# Patient Record
Sex: Female | Born: 1935 | Race: White | Hispanic: No | Marital: Single | Smoking: Never smoker
Health system: Southern US, Community
[De-identification: ages and names within clinical notes are randomized; demographics above are authoritative.]

## PROBLEM LIST (undated history)

## (undated) DIAGNOSIS — J302 Other seasonal allergic rhinitis: Secondary | ICD-10-CM

## (undated) DIAGNOSIS — E039 Hypothyroidism, unspecified: Secondary | ICD-10-CM

## (undated) DIAGNOSIS — M353 Polymyalgia rheumatica: Secondary | ICD-10-CM

## (undated) DIAGNOSIS — C9000 Multiple myeloma not having achieved remission: Secondary | ICD-10-CM

## (undated) DIAGNOSIS — K5790 Diverticulosis of intestine, part unspecified, without perforation or abscess without bleeding: Secondary | ICD-10-CM

## (undated) DIAGNOSIS — R002 Palpitations: Secondary | ICD-10-CM

## (undated) DIAGNOSIS — K644 Residual hemorrhoidal skin tags: Secondary | ICD-10-CM

## (undated) DIAGNOSIS — M549 Dorsalgia, unspecified: Secondary | ICD-10-CM

## (undated) DIAGNOSIS — D649 Anemia, unspecified: Secondary | ICD-10-CM

## (undated) DIAGNOSIS — E785 Hyperlipidemia, unspecified: Secondary | ICD-10-CM

## (undated) DIAGNOSIS — K219 Gastro-esophageal reflux disease without esophagitis: Secondary | ICD-10-CM

## (undated) DIAGNOSIS — I10 Essential (primary) hypertension: Secondary | ICD-10-CM

## (undated) DIAGNOSIS — M1990 Unspecified osteoarthritis, unspecified site: Secondary | ICD-10-CM

## (undated) DIAGNOSIS — R001 Bradycardia, unspecified: Secondary | ICD-10-CM

## (undated) DIAGNOSIS — Z950 Presence of cardiac pacemaker: Secondary | ICD-10-CM

## (undated) DIAGNOSIS — E0590 Thyrotoxicosis, unspecified without thyrotoxic crisis or storm: Secondary | ICD-10-CM

## (undated) DIAGNOSIS — H3530 Unspecified macular degeneration: Secondary | ICD-10-CM

## (undated) DIAGNOSIS — E049 Nontoxic goiter, unspecified: Secondary | ICD-10-CM

## (undated) DIAGNOSIS — G8929 Other chronic pain: Secondary | ICD-10-CM

## (undated) HISTORY — DX: Thyrotoxicosis, unspecified without thyrotoxic crisis or storm: E05.90

## (undated) HISTORY — PX: INSERT / REPLACE / REMOVE PACEMAKER: SUR710

## (undated) HISTORY — PX: THYROIDECTOMY: SHX17

## (undated) HISTORY — DX: Nontoxic goiter, unspecified: E04.9

## (undated) HISTORY — PX: OTHER SURGICAL HISTORY: SHX169

## (undated) HISTORY — PX: TONSILLECTOMY: SUR1361

## (undated) HISTORY — DX: Multiple myeloma not having achieved remission: C90.00

---

## 2000-01-11 ENCOUNTER — Encounter: Payer: Self-pay | Admitting: Family Medicine

## 2000-01-11 ENCOUNTER — Encounter: Admission: RE | Admit: 2000-01-11 | Discharge: 2000-01-11 | Payer: Self-pay | Admitting: Family Medicine

## 2002-04-28 ENCOUNTER — Encounter: Admission: RE | Admit: 2002-04-28 | Discharge: 2002-04-28 | Payer: Self-pay | Admitting: Specialist

## 2002-04-28 ENCOUNTER — Encounter: Payer: Self-pay | Admitting: Specialist

## 2002-12-26 ENCOUNTER — Emergency Department (HOSPITAL_COMMUNITY): Admission: EM | Admit: 2002-12-26 | Discharge: 2002-12-26 | Payer: Self-pay | Admitting: *Deleted

## 2002-12-26 ENCOUNTER — Encounter: Payer: Self-pay | Admitting: *Deleted

## 2003-04-21 ENCOUNTER — Ambulatory Visit (HOSPITAL_COMMUNITY): Admission: RE | Admit: 2003-04-21 | Discharge: 2003-04-21 | Payer: Self-pay | Admitting: Specialist

## 2006-05-22 ENCOUNTER — Encounter (INDEPENDENT_AMBULATORY_CARE_PROVIDER_SITE_OTHER): Payer: Self-pay | Admitting: Specialist

## 2006-05-22 ENCOUNTER — Ambulatory Visit (HOSPITAL_COMMUNITY): Admission: RE | Admit: 2006-05-22 | Discharge: 2006-05-22 | Payer: Self-pay | Admitting: Gastroenterology

## 2006-09-07 ENCOUNTER — Ambulatory Visit: Payer: Self-pay | Admitting: *Deleted

## 2006-09-07 ENCOUNTER — Inpatient Hospital Stay (HOSPITAL_COMMUNITY): Admission: EM | Admit: 2006-09-07 | Discharge: 2006-09-09 | Payer: Self-pay | Admitting: Emergency Medicine

## 2006-09-18 ENCOUNTER — Ambulatory Visit: Payer: Self-pay | Admitting: Cardiology

## 2006-09-18 ENCOUNTER — Encounter (HOSPITAL_COMMUNITY): Admission: RE | Admit: 2006-09-18 | Discharge: 2006-10-18 | Payer: Self-pay | Admitting: Cardiology

## 2006-09-24 ENCOUNTER — Ambulatory Visit (HOSPITAL_COMMUNITY): Admission: RE | Admit: 2006-09-24 | Discharge: 2006-09-24 | Payer: Self-pay | Admitting: Cardiology

## 2006-10-02 ENCOUNTER — Ambulatory Visit: Payer: Self-pay | Admitting: Cardiology

## 2008-08-27 ENCOUNTER — Ambulatory Visit (HOSPITAL_COMMUNITY): Admission: RE | Admit: 2008-08-27 | Discharge: 2008-08-27 | Payer: Self-pay | Admitting: Internal Medicine

## 2009-12-28 ENCOUNTER — Ambulatory Visit: Payer: Self-pay | Admitting: Cardiology

## 2009-12-28 ENCOUNTER — Inpatient Hospital Stay (HOSPITAL_COMMUNITY)
Admission: EM | Admit: 2009-12-28 | Discharge: 2009-12-30 | Payer: Self-pay | Source: Home / Self Care | Admitting: Emergency Medicine

## 2010-01-03 ENCOUNTER — Ambulatory Visit: Payer: Self-pay | Admitting: Cardiology

## 2010-01-03 ENCOUNTER — Encounter: Payer: Self-pay | Admitting: Adult Health

## 2010-01-03 DIAGNOSIS — D649 Anemia, unspecified: Secondary | ICD-10-CM | POA: Insufficient documentation

## 2010-01-03 DIAGNOSIS — I498 Other specified cardiac arrhythmias: Secondary | ICD-10-CM

## 2010-01-03 DIAGNOSIS — R002 Palpitations: Secondary | ICD-10-CM | POA: Insufficient documentation

## 2010-01-03 DIAGNOSIS — E7800 Pure hypercholesterolemia, unspecified: Secondary | ICD-10-CM

## 2010-01-09 ENCOUNTER — Ambulatory Visit (HOSPITAL_COMMUNITY): Admission: RE | Admit: 2010-01-09 | Discharge: 2010-01-11 | Payer: Self-pay | Admitting: Cardiology

## 2010-01-11 ENCOUNTER — Ambulatory Visit: Payer: Self-pay | Admitting: Cardiology

## 2010-01-17 ENCOUNTER — Ambulatory Visit: Payer: Self-pay | Admitting: Cardiology

## 2010-02-21 ENCOUNTER — Ambulatory Visit: Payer: Self-pay | Admitting: Internal Medicine

## 2010-02-21 HISTORY — PX: COLONOSCOPY: SHX174

## 2010-03-22 ENCOUNTER — Ambulatory Visit: Payer: Self-pay | Admitting: Internal Medicine

## 2010-03-22 ENCOUNTER — Ambulatory Visit (HOSPITAL_COMMUNITY): Admission: RE | Admit: 2010-03-22 | Discharge: 2010-03-22 | Payer: Self-pay | Admitting: Internal Medicine

## 2010-04-28 ENCOUNTER — Ambulatory Visit (HOSPITAL_COMMUNITY)
Admission: RE | Admit: 2010-04-28 | Discharge: 2010-04-28 | Payer: Self-pay | Source: Home / Self Care | Attending: Internal Medicine | Admitting: Internal Medicine

## 2010-05-25 NOTE — Assessment & Plan Note (Signed)
Summary: 2 wk f/u per checkout on 01/03/10/tg   Visit Type:  Follow-up Primary Trashawn Oquendo:  [name]  CC:  stll having palpatations.  History of Present Illness: [name] is a very pleasant 77 y/o CF we are seeing on follow-up after having a holter monitor placed secondary to frequent palpatations.  She was originally seen while hospitalized for bradycardia and palpatations on consultation.  She was found to be bradycardic on norvasc and this was discontinued when she left the hospital. Instead she was placed on hydralazine 25mg  two times a day.  She continued to have palpatations and therefore the holter monitor was placed.  She continues to have the same complaints but is asymptomatic with them.  Current Medications (verified): 1)  Amitiza 8 Mcg Caps (Lubiprostone) .... Take 1 Tab Two Times A Day 2)  Flonase 50 Mcg/act Susp (Fluticasone Propionate) .... Use As Needed 3)  Vitamin D2 400 Unit Tabs (Ergocalciferol) .... Take 1 Tab Daily 4)  Metamucil Multihealth Fiber 55.46 % Powd (Psyllium) .... Use As Needed 5)  Miralax  Powd (Polyethylene Glycol 3350) .... Use As Needed 6)  Omega-3 Fish Oil 1200 Mg Caps (Omega-3 Fatty Acids) .... Take 1 Cap Daily 7)  Ocuvite  Tabs (Multiple Vitamins-Minerals) .... Take 1 Tab Daily 8)  Aspir-Low 81 Mg Tbec (Aspirin) .... Take 1 Tab Daily 9)  Alprazolam 0.25 Mg Tabs (Alprazolam) .... Take As Needed At Bedtime 10)  Cetirizine Hcl 10 Mg Tabs (Cetirizine Hcl) .... Take As Needed 11)  Alendronate Sodium 70 Mg Tabs (Alendronate Sodium) .... Take 1 Tab Weekly 12)  Simvastatin 20 Mg Tabs (Simvastatin) .... Take 1 Tab Daily 13)  Lisinopril 20 Mg Tabs (Lisinopril) .... Take 1 Tab Two Times A Day 14)  Levothroid 50 Mcg Tabs (Levothyroxine Sodium) .... Take 1 Tab Daily 15)  Hydralazine Hcl 25 Mg Tabs (Hydralazine Hcl) .Marland Kitchen.. 1 Po Two Times A Day 16)  Omeprazole 20 Mg Cpdr (Omeprazole) .Marland Kitchen.. 1 By Mouth Daily 17)  Multivitamins   Tabs (Multiple Vitamin) .Marland Kitchen.. 1 By  Mouth Daily 18)  Aspirin 81 Mg  Tabs (Aspirin) .Marland Kitchen.. 1 By Mouth Daily 19)  Magnesium 200 Mg Tabs (Magnesium) .... Take 2 Tablets By Mouth Daily  Allergies (verified): No Known Drug Allergies  Review of Systems       Palpatations.  Urinary frequency  All other systems have been reviewed and are negative unless stated above.   Vital Signs:  Patient profile:   77 year old female Weight:      139 pounds BMI:     24.71 Pulse rate:   68 / minute BP sitting:   134 / 51  (right arm)  Vitals Entered By: Marilyn Saa, CNA (January 17, 2010 2:06 PM)  Physical Exam  General:  Well developed, well nourished, in no acute distress. Lungs:  Clear bilaterally to auscultation and percussion. Heart:  Non-displaced PMI, chest non-tender; regular rate and rhythm, S1, S2 without murmurs, rubs or gallops. Carotid upstroke normal, no bruit. Normal abdominal aortic size, no bruits. Femorals normal pulses, no bruits. Pedals normal pulses. No edema, no varicosities. Pulses:  pulses normal in all 4 extremities Psych:  Normal affect.   Impression & Recommendations:  Problem # 1:  PALPITATIONS (ICD-785.1) I have reviewed the Holter monitor report read by Dr. Diona Browner.  It demonstrated that the rhythm is sinus with a range of 39-126 bpm.  No pauses with occasional PAC's and PVC's without sustained arryhtymias.  I have discussed tha test resutls with  her. I also reassured her that her palpatations are benign and can be caused by stress, extra caffine, or lack of sleep.  She stated that she does not sleep more than a couple of hours at a time because she gets up to go to the bathroom several times a night.  She states this has been an ongoing problem and has discussed this with her primary.  I have reviewed her medications and do not see any medications that would cause nocturnal excessive urination.  I have recommended that she see a urologist after talking with Dr Margo Aye on follow-up.  I have advised that she  take on magnesium  tablet daily to assist with palpatations. She will see [place] again in 6 months. Her updated medication list for this problem includes:    Aspir-low 81 Mg Tbec (Aspirin) .Marland Kitchen... Take 1 tab daily    Lisinopril 20 Mg Tabs (Lisinopril) .Marland Kitchen... Take 1 tab two times a day    Aspirin 81 Mg Tabs (Aspirin) .Marland Kitchen... 1 by mouth daily  Patient Instructions: 1)  Your physician recommends that you schedule a follow-up appointment in: 6 months 2)  Your physician has recommended you make the following change in your medication: magnesium 200mg   take 2 tablets by mouth daily

## 2010-05-25 NOTE — Procedures (Signed)
Summary: Holter and Event  Holter and Event   Imported By: Faythe Ghee 01/11/2010 16:01:15  _____________________________________________________________________  External Attachment:    Type:   Image     Comment:   External Document  Appended Document: Holter and Event I spoke with pt and reported findings, verbalized understanding

## 2010-05-25 NOTE — Assessment & Plan Note (Signed)
Summary: EC6 PALPITATIONS LAST SEEN RR 2008/[facility]J   Visit Type:  Follow-up  CC:  palpitations.  History of Present Illness: [name] is a very pleasant 77 y/o CF we are seeing on follow-up post hospitalization where we assessed her for bradycardia and palpatations.  At that time she was taken off of Norvasc and placed on hydralazine for BP control as she was bradycardic.  She has a history of hypertension, hypthyroidism with recent adjustment of medications, normocytic anemia with hemoccult + stool inpt. She comes today with continued complaints of palpatations which sometimes awaken her at night, but occur throughout the day.  She denies associated chest pain or dyspnea.  Sometimes there is associated weakness.  Current Medications (verified): 1)  Amitiza 8 Mcg Caps (Lubiprostone) .... Take 1 Tab Two Times A Day 2)  Flonase 50 Mcg/act Susp (Fluticasone Propionate) .... Use As Needed 3)  Vitamin D2 400 Unit Tabs (Ergocalciferol) .... Take 1 Tab Daily 4)  Metamucil Multihealth Fiber 55.46 % Powd (Psyllium) .... Use As Needed 5)  Miralax  Powd (Polyethylene Glycol 3350) .... Use As Needed 6)  Omega-3 Fish Oil 1200 Mg Caps (Omega-3 Fatty Acids) .... Take 1 Cap Daily 7)  Ocuvite  Tabs (Multiple Vitamins-Minerals) .... Take 1 Tab Daily 8)  Aspir-Low 81 Mg Tbec (Aspirin) .... Take 1 Tab Daily 9)  Alprazolam 0.25 Mg Tabs (Alprazolam) .... Take As Needed At Bedtime 10)  Cetirizine Hcl 10 Mg Tabs (Cetirizine Hcl) .... Take As Needed 11)  Alendronate Sodium 70 Mg Tabs (Alendronate Sodium) .... Take 1 Tab Weekly 12)  Simvastatin 20 Mg Tabs (Simvastatin) .... Take 1 Tab Daily 13)  Lisinopril 20 Mg Tabs (Lisinopril) .... Take 1 Tab Two Times A Day 14)  Levothroid 50 Mcg Tabs (Levothyroxine Sodium) .... Take 1 Tab Daily 15)  Hydralazine Hcl 25 Mg Tabs (Hydralazine Hcl) .Marland Kitchen.. 1 Po Two Times A Day 16)  Omeprazole 20 Mg Cpdr (Omeprazole) .Marland Kitchen.. 1 By Mouth Daily 17)  Multivitamins   Tabs (Multiple Vitamin)  .Marland Kitchen.. 1 By Mouth Daily 18)  Aspirin 81 Mg  Tabs (Aspirin) .Marland Kitchen.. 1 By Mouth Daily  Allergies (verified): No Known Drug Allergies  Past History:  Past medical, surgical, family and social histories (including risk factors) reviewed, and no changes noted (except as noted below).  Past Medical History: Reviewed history from 01/02/2010 and no changes required. anemia and bradyarrhythmia palpatations hypothyroidism hypertension hyperlipidemia chronic constipation  Past Surgical History: Reviewed history from 01/02/2010 and no changes required. tonsillectomy  Family History: Reviewed history from 01/02/2010 and no changes required. Father:deceased due to colon cancer Mother:deceased due to chf and diabetes Siblings:brother deceased due to metastatic lung cancer 1 sister alive has heart and back problems  Social History: Reviewed history from 01/02/2010 and no changes required. Retired  Widowed  Tobacco Use - No.  Alcohol Use - no Regular Exercise - no Drug Use - no  Review of Systems       Palpatations  All other systems have been reviewed and are negative unless stated above.   Vital Signs:  Patient profile:   77 year old female Height:      63 inches Weight:      140 pounds BMI:     24.89 Pulse rate:   76 / minute Resp:     16 per minute BP sitting:   159 / 72  (left arm)  Vitals Entered By: Marrion Coy, CNA (January 03, 2010 2:46 PM)  Physical Exam  General:  Well  developed, well nourished, in no acute distress. Lungs:  Clear bilaterally to auscultation and percussion. Heart:  Non-displaced PMI, chest non-tender; regular rate and rhythm, S1, S2 without murmurs, rubs or gallops. Carotid upstroke normal, no bruit. Normal abdominal aortic size, no bruits. Femorals normal pulses, no bruits. Pedals normal pulses. No edema, no varicosities. Abdomen:  Bowel sounds positive; abdomen soft and non-tender without masses, organomegaly, or hernias noted. No  hepatosplenomegaly. Msk:  Back normal, normal gait. Muscle strength and tone normal. Pulses:  pulses normal in all 4 extremities Extremities:  No clubbing or cyanosis. Neurologic:  Alert and oriented x 3. Psych:  Normal affect.   Impression & Recommendations:  Problem # 1:  PALPITATIONS (ICD-785.1) Will plan 48 hr holter monitor placement to assess for frequency and duration of self reported palpations.  We will not change medications at this time to test her on current regimine. The following medications were removed from the medication list:    Amlodipine Besylate 2.5 Mg Tabs (Amlodipine besylate) .Marland Kitchen... Take 1 tab daily Her updated medication list for this problem includes:    Aspir-low 81 Mg Tbec (Aspirin) .Marland Kitchen... Take 1 tab daily    Lisinopril 20 Mg Tabs (Lisinopril) .Marland Kitchen... Take 1 tab two times a day    Aspirin 81 Mg Tabs (Aspirin) .Marland Kitchen... 1 by mouth daily  Orders: Holter Monitor (Holter Monitor)  Problem # 2:  ANEMIA (ICD-285.9) She states she has been placed on a MVI with iron and is planned to have colonoscopy next week. Hemoccut card is provided with instructions. Orders: Hemoccult Cards (Take Home) (Hemoccult Cards)  Patient Instructions: 1)  Your physician recommends that you schedule a follow-up appointment in: 2 weeks 2)  Your physician has recommended that you wear a holter monitor.  Holter monitors are medical devices that record the heart's electrical activity. Doctors most often use these monitors to diagnose arrhythmias. Arrhythmias are problems with the speed or rhythm of the heartbeat. The monitor is a small, portable device. You can wear one while you do your normal daily activities. This is usually used to diagnose what is causing palpitations/syncope (passing out). 3)  Your physician has asked that you test your stool for blood. It is necessary to test 3 different stool specimens for accuracy. You will be given 3 hemoccult cards for specimen collection. For each stool  specimen, place a small portion of stool sample (from 2 different areas of the stool) into the 2 squares on the card. Close card. Repeat with 2 more stool specimens. Bring the cards back to the office for testing.

## 2010-06-19 ENCOUNTER — Encounter: Payer: Self-pay | Admitting: Adult Health

## 2010-06-19 ENCOUNTER — Ambulatory Visit (INDEPENDENT_AMBULATORY_CARE_PROVIDER_SITE_OTHER): Payer: MEDICARE | Admitting: Adult Health

## 2010-06-19 DIAGNOSIS — I10 Essential (primary) hypertension: Secondary | ICD-10-CM

## 2010-06-19 DIAGNOSIS — R002 Palpitations: Secondary | ICD-10-CM

## 2010-06-20 ENCOUNTER — Encounter: Payer: Self-pay | Admitting: Adult Health

## 2010-06-29 NOTE — Assessment & Plan Note (Signed)
Summary: FOLLOW UP - 6 MONTHS   Visit Type:  Follow-up Primary Provider:  Sabino Snipes  CC:  6 mth fu having palps.  History of Present Illness: [name] is a 77 y/o CF we are following for complaints of palpatations. She had a holter monitor placed by Dr. Diona Browner in Sept 2011 demonstrating mild PAC's. and PVC but no sustained arrythimias.  She continues to have occasional palpatations but not enough to stop her from her usual activities. She is on magnesium and this has helped some.  She is feeling well otherwise. She remains active  Current Medications (verified): 1)  Amitiza 8 Mcg Caps (Lubiprostone) .... Take 1 Tab Two Times A Day 2)  Flonase 50 Mcg/act Susp (Fluticasone Propionate) .... Use As Needed 3)  Vitamin D2 400 Unit Tabs (Ergocalciferol) .... Take 1 Tab Daily 4)  Metamucil Multihealth Fiber 55.46 % Powd (Psyllium) .... Use As Needed 5)  Miralax  Powd (Polyethylene Glycol 3350) .... Use As Needed 6)  Omega-3 Fish Oil 1200 Mg Caps (Omega-3 Fatty Acids) .... Take 1 Cap Daily 7)  Aspir-Low 81 Mg Tbec (Aspirin) .... Take 1 Tab Daily 8)  Alprazolam 0.25 Mg Tabs (Alprazolam) .... Take As Needed At Bedtime 9)  Cetirizine Hcl 10 Mg Tabs (Cetirizine Hcl) .... Take As Needed 10)  Alendronate Sodium 70 Mg Tabs (Alendronate Sodium) .... Take 1 Tab Weekly 11)  Simvastatin 20 Mg Tabs (Simvastatin) .... Take 1 Tab Daily 12)  Lisinopril 20 Mg Tabs (Lisinopril) .... Take 1 Tab Two Times A Day 13)  Levothroid 50 Mcg Tabs (Levothyroxine Sodium) .... Take 1 Tab Daily 14)  Hydralazine Hcl 25 Mg Tabs (Hydralazine Hcl) .Marland Kitchen.. 1 Po Two Times A Day 15)  Multivitamins   Tabs (Multiple Vitamin) .Marland Kitchen.. 1 By Mouth Daily 16)  Aspirin 81 Mg  Tabs (Aspirin) .Marland Kitchen.. 1 By Mouth Daily 17)  Magnesium 200 Mg Tabs (Magnesium) .... Take 2 Tablets By Mouth Daily 18)  Perdiem Overnight Relief 15 Mg Tabs (Sennosides) .... Take As Needed 19)  Macuvite Eye Care  Tabs (Multiple Vitamins-Minerals) .... Take 1 Tab  Daily  Allergies (verified): No Known Drug Allergies  Comments:  Nurse/Medical Assistant: patient brought med list we reviewed [name] is pharmacy  Review of Systems       All other systems have been reviewed and are negative unless stated above.   Vital Signs:  Patient profile:   77 year old female Weight:      140 pounds BMI:     24.89 Pulse rate:   65 / minute BP sitting:   144 / 73  (left arm)  Vitals Entered By: Marilyn Saa, CNA (June 19, 2010 2:08 PM)  Physical Exam  General:  Well developed, well nourished, in no acute distress.healthy appearing.   Lungs:  Clear bilaterally to auscultation and percussion. Heart:  Non-displaced PMI, chest non-tender; regular rate and rhythm, S1, S2 without murmurs, rubs or gallops. Carotid upstroke normal, no bruit. Normal abdominal aortic size, no bruits. Femorals normal pulses, no bruits. Pedals normal pulses. No edema, no varicosities. Abdomen:  Bowel sounds positive; abdomen soft and non-tender without masses, organomegaly, or hernias noted. No hepatosplenomegaly. Msk:  Back normal, normal gait. Muscle strength and tone normal. Pulses:  pulses normal in all 4 extremities Extremities:  No clubbing or cyanosis. Neurologic:  Alert and oriented x 3. Psych:  Normal affect.   Impression & Recommendations:  Problem # 1:  PALPITATIONS (ICD-785.1) Assessment Unchanged We will continue to see her for  this.  No medication changes at this time.  She is asymptomatic with the palpatations.  Reassurance is given to her. Should she have symptoms of syncope or chest pain associated with these, she is to call us sooner.  Otherwise she will see Dr. Dietrich Pates in 6 monhts. Her updated medication list for this problem includes:    Aspir-low 81 Mg Tbec (Aspirin) .Marland Kitchen... Take 1 tab daily    Lisinopril 20 Mg Tabs (Lisinopril) .Marland Kitchen... Take 1 tab two times a day    Aspirin 81 Mg Tabs (Aspirin) .Marland Kitchen... 1 by mouth daily  Patient  Instructions: 1)  Your physician recommends that you schedule a follow-up appointment in: 6 months

## 2010-07-06 LAB — BASIC METABOLIC PANEL
BUN: 21 mg/dL (ref 6–23)
CO2: 26 mEq/L (ref 19–32)
CO2: 26 mEq/L (ref 19–32)
Calcium: 8.7 mg/dL (ref 8.4–10.5)
Calcium: 9.4 mg/dL (ref 8.4–10.5)
Chloride: 102 mEq/L (ref 96–112)
Chloride: 104 mEq/L (ref 96–112)
GFR calc Af Amer: >60 mL/min (ref >60)
GFR calc Af Amer: >60 mL/min (ref >60)
GFR calc non Af Amer: 55 mL/min — ABNORMAL LOW (ref >60)
Glucose, Bld: 86 mg/dL (ref 70–99)
Glucose, Bld: 91 mg/dL (ref 70–99)
Potassium: 3.8 mEq/L (ref 3.5–5.1)
Sodium: 133 mEq/L — ABNORMAL LOW (ref 135–145)
Sodium: 135 mEq/L (ref 135–145)

## 2010-07-06 LAB — HEPATIC FUNCTION PANEL
AST: 21 U/L (ref 0–37)
Albumin: 3.8 g/dL (ref 3.5–5.2)
Total Bilirubin: 0.7 mg/dL (ref 0.3–1.2)

## 2010-07-06 LAB — IRON AND TIBC
TIBC: 239 ug/dL — ABNORMAL LOW (ref 250–470)
UIBC: 189 ug/dL

## 2010-07-06 LAB — URINALYSIS, ROUTINE W REFLEX MICROSCOPIC
Glucose, UA: NEGATIVE mg/dL
Specific Gravity, Urine: 1.01 (ref 1.005–1.030)
pH: 6 (ref 5.0–8.0)

## 2010-07-06 LAB — CBC
HCT: 29.2 % — ABNORMAL LOW (ref 36.0–46.0)
Hemoglobin: 11 g/dL — ABNORMAL LOW (ref 12.0–15.0)
Hemoglobin: 9.9 g/dL — ABNORMAL LOW (ref 12.0–15.0)
MCH: 34.1 pg — ABNORMAL HIGH (ref 26.0–34.0)
MCHC: 35.2 g/dL (ref 30.0–36.0)
MCHC: 35.7 g/dL (ref 30.0–36.0)
MCV: 95.3 fL (ref 78.0–100.0)
MCV: 95.8 fL (ref 78.0–100.0)
RBC: 2.91 MIL/uL — ABNORMAL LOW (ref 3.87–5.11)
RDW: 15.7 % — ABNORMAL HIGH (ref 11.5–15.5)
WBC: 4.6 10*3/uL (ref 4.0–10.5)

## 2010-07-06 LAB — DIFFERENTIAL
Basophils Absolute: 0 10*3/uL (ref 0.0–0.1)
Basophils Absolute: 0 10*3/uL (ref 0.0–0.1)
Basophils Relative: 1 % (ref 0–1)
Basophils Relative: 1 % (ref 0–1)
Eosinophils Absolute: 0.1 10*3/uL (ref 0.0–0.7)
Eosinophils Relative: 2 % (ref 0–5)
Lymphocytes Relative: 40 % (ref 12–46)
Lymphocytes Relative: 48 % — ABNORMAL HIGH (ref 12–46)
Lymphs Abs: 2.2 10*3/uL (ref 0.7–4.0)
Monocytes Absolute: 0.5 10*3/uL (ref 0.1–1.0)
Monocytes Relative: 10 % (ref 3–12)
Monocytes Relative: 11 % (ref 3–12)
Neutro Abs: 1.9 10*3/uL (ref 1.7–7.7)
Neutrophils Relative %: 39 % — ABNORMAL LOW (ref 43–77)
Neutrophils Relative %: 50 % (ref 43–77)

## 2010-07-06 LAB — POCT CARDIAC MARKERS
CKMB, poc: <1 ng/mL — ABNORMAL LOW (ref 1.0–8.0)
Myoglobin, poc: 52.3 ng/mL (ref 12–200)

## 2010-07-06 LAB — FOLATE: Folate: 17.1 ng/mL

## 2010-07-06 LAB — T4, FREE: Free T4: 1.23 ng/dL (ref 0.80–1.80)

## 2010-07-06 LAB — RETICULOCYTES: Retic Ct Pct: 3.3 % — ABNORMAL HIGH (ref 0.4–3.1)

## 2010-07-06 LAB — FERRITIN: Ferritin: 62 ng/mL (ref 10–291)

## 2010-09-05 NOTE — Letter (Signed)
October 02, 2006    Teena Irani. Arlyce Dice, M.D.  [address] [place]  [address]   RE:  KATHALENE, SPORER  MRN:  [id number]  /  DOB:  [birth year]   Dear Onalee Hua:   Ms. Zale returns to the office after a hiatus of six years. She was  recently seen at [facility] for chest pain. Myocardial  infarction was ruled out. An outpatient workup was advised. She had an  echocardiogram that was normal except for the presence of LVH. A graded  exercise test showed impaired exercised capacity with normal left  ventricular size, normal left ventricular systolic function, and breast  attenuation without convincing evidence for infarction or ischemia.   Ms. Deanda has felt better since hospital discharge. She continues to  have occasional episodes of sharp momentary chest discomfort, but these  have been present for some time.   Her blood pressure was quite elevated at the time she presented to the  hospital with a systolic in the range of 200-215. She has monitored her  blood pressure very carefully since returning home, providing [place] with  perhaps 50 readings. Approximately 90% are just fine. She has absolutely  no diastolic hypertension. She has occasional systolic values in the low  150s and high 140s. Her blood pressure device was checked against ours  and was found to be accurate.   CURRENT MEDICATIONS:  1. Aspirin 81 mg daily.  2. Actonel 1 weekly.  3. Amlodipine 5 mg daily.  4. Lisinopril 20 mg b.i.d.  5. Levothyroxine 0.5 mg daily.  6. Fish oil 1000 mg b.i.d.   PHYSICAL EXAMINATION:  GENERAL:  Trim, healthy appearing, vibrant  octogenarian.  VITAL SIGNS:  Weight 147 pounds, 5 pounds less than in 2002. Blood  pressure 125/60, heart rate 70 and regular, respirations 16.  NECK:  No jugular venous distension; normal carotid upstrokes without  bruits.  LUNGS:  Clear.  CARDIAC:  4th heart sound and minimal systolic ejection murmur noted.  ABDOMEN:  Soft and nontender; no bruits; no  organomegaly; aortic  pulsation not palpable.  EXTREMITIES:  No edema; distal pulses intact.   IMPRESSION:  Ms. Avery continues to do well without demonstrable  vascular disease. Control of hypertension is good. Ms. Peppel tells me  that lipid profiles have been only slightly abnormal. In the absence of  known atherosclerotic disease, she does not merit pharmacologic therapy  for dyslipidemia. She will continue her current medications as managed  by you. I would be happy to see her again at any time that she has  symptoms suggestive of cardiac problems.    Sincerely,      Gerrit Friends. Dietrich Pates, MD, [facility]  Electronically Signed    RMR/MedQ  DD: 10/02/2006  DT: 10/03/2006  Job #: [id number]

## 2010-09-05 NOTE — Procedures (Signed)
NAME:  [name], CONVEY                  ACCOUNT NO.:  [id number]   MEDICAL RECORD NO.:  [id number]          PATIENT TYPE:  OUT   LOCATION:  RAD                           FACILITY:  [facility]   PHYSICIAN:  Gerrit Friends. Dietrich Pates, MD, FACCDATE OF BIRTH:  Jul 10, 1922   DATE OF PROCEDURE:  09/24/2006  DATE OF DISCHARGE:                                ECHOCARDIOGRAM   CLINICAL DATA:  An 77 year old woman with chest pain.   M-mode aorta 3.1, left atrium 3.6, septum 1.3, posterior wall 1.2, LV  diastole 3.6, LV systole 2.7.  1. Technically suboptimal but adequate echocardiographic study.  2. Normal right ventricular size and function; RVH present.  3. Normal left and right atria.  4. Normal diameter of the proximal ascending aorta; calcification of      the wall.  5. Normal aortic valve.  6. Normal mitral valve.  7. Normal tricuspid valve.  8. Pulmonic valve and proximal pulmonary artery not well imaged; flow      by Doppler in the right ventricular outflow tract is normal.  9. Normal left ventricular size; just mild LVH with disproportionate      upper septal thickening; normal regional and global function.  10.Normal IVC.  11.Normal Doppler study.      Gerrit Friends. Dietrich Pates, MD, [facility]  Electronically Signed     RMR/MEDQ  D:  09/25/2006  T:  09/25/2006  Job:  [id number]

## 2010-09-05 NOTE — Discharge Summary (Signed)
[name], [name]                  ACCOUNT NO.:  [id number]   MEDICAL RECORD NO.:  [id number]          PATIENT TYPE:  INP   LOCATION:  2025                         FACILITY:  [facility]   PHYSICIAN:  Jonelle Sidle, MD DATE OF BIRTH:  1922-05-30   DATE OF ADMISSION:  09/07/2006  DATE OF DISCHARGE:  09/09/2006                         DISCHARGE SUMMARY - REFERRING   DISCHARGE DIAGNOSES:  1. Atypical chest discomfort.  2. Hypertension.  3. Normocytic anemia.  4. Sinus bradycardia.   HISTORY OF PRESENT ILLNESS:  [name] is an 77 year old white female  who presented to the emergency room with chest discomfort.  She  described at least 10 episodes of sharp discomfort that lasted just  seconds each time occurring in her left chest.  She denied associated  symptoms of shortness of breath, nausea, diaphoresis, recent injuries or  trauma.  She was admitted for further evaluation.  Her history is  notable for hypertension, hypothyroidism, cardiac catheterization  approximately 6 years ago that showed nonobstructive coronary disease.   LABORATORY DATA:  Chest x-ray on Sep 07, 2006 showed cardiomegaly, no  acute findings.  Admission hemoglobin and hematocrit was 10.5 and 31,  normal indices, platelet 296,000, WBCs 5,300.  PTT was 37, PT 13.8, INR  1.0.  Sodium 132, potassium 4.3, BUN 23, creatinine 1.1, glucose 100.  C-  MET on Sep 08, 2006 was all within normal limits.  CK MBs, relative  indexes and troponins were within normal limits x3.  TSH and free T4 was  4.514 and 1.28 respectively.  D-dimer was normal.  EKG showed sinus  bradycardia.   HOSPITAL COURSE:  [name] was admitted to 2000.  Initially she had  some recurrent left sharp discomfort going into her shoulder and arm,  but this subsequently resolved.  It was felt that her discomfort was  very atypical and initial enzymes were reassuring.  Overnight she did  not have any further chest discomfort.  Set of three enzymes were  negative for myocardial infarction.  She remained in sinus bradycardia.  Ambulation was performed with a heart rate response up into the 80s with  ambulation.  After review by Dr. Diona Browner it was felt that she could be  discharged home with further outpatient testing and follow-up.   DISPOSITION:  [name] is discharged home.  Activity and wound care are  not restricted or applicable.  She was asked to maintain a low salt,  heart-healthy diet.  Her medications include:  1. Norvasc 5 mg daily.  2. Lisinopril 20 mg b.i.d.  3. Actonel 35 mg weekly.  4. Levoxyl 50 mcg daily.  5. Aspirin 81 daily.  6. Meloxicam 15 mg daily.  7. Ocuvite 2 tablets daily.  8. Multivitamin daily.  9. Fish oil 1200 mg daily.  10.Pyridium daily.   She was asked to begin a blood pressure diary and when she follows up  with Dr. Dietrich Pates to bring all medications and blood pressure diary to  all appointments.  Our regional office will call her at home with  arrangements for an outpatient echocardiogram, adenosine Myoview and  follow up with Dr. Dietrich Pates.   Discharge time 20 minutes.      Joellyn Rued, PA-C      Jonelle Sidle, MD  Electronically Signed    EW/MEDQ  D:  09/09/2006  T:  09/09/2006  Job:  [id number]   cc:   Gerrit Friends. Dietrich Pates, MD, [facility]

## 2010-09-05 NOTE — H&P (Signed)
NAME:  [name], [name]                  ACCOUNT NO.:  [id number]   MEDICAL RECORD NO.:  [id number]          PATIENT TYPE:  INP   LOCATION:  1843                         FACILITY:  [facility]   PHYSICIAN:  Rod Holler, MD     DATE OF BIRTH:  [birth year]   DATE OF ADMISSION:  09/07/2006  DATE OF DISCHARGE:                              HISTORY & PHYSICAL   STAT HISTORY AND PHYSICAL   CHIEF COMPLAINT:  Chest pain.   HISTORY:  Patient is an 77 year old female with a history of  hypertension and hypothyroidism, history of arrhythmia about six years  ago with a cardiac catheterization at that time, who presented to the  emergency department with complaints of chest pain.  The patient today  has had about 10 episodes of a very sharp, very short-lived chest  discomfort.  These episodes are on her left side of her chest, last for  about a second at a time.  There is no radiation of the discomfort, and  no known history of trauma.  She has no associated symptoms of shortness  of breath, nausea, or diaphoresis.  She has had no recent PND or  orthopnea, no lower extremity swelling.  The patient was also noted to  be bradycardic in the ER.  Upon further questioning, she has had some  recent fatigue, presyncope, but no true syncope.   PAST MEDICAL HISTORY:  1. Hypertension.  2. Hypothyroidism.  3. Possible atrial fibrillation in the past.   MEDICINES:  1. Actonel 35 mg weekly.  2. Quinapril 2 mg p.o. daily.  3. Levoxyl 50 mcg p.o. daily.  4. Aspirin 81 mg p.o. daily.  5. Meloxicam 15 mg p.o. daily.   ALLERGIES:  SULFA.   SOCIAL HISTORY:  The patient is a nonsmoker, is married, and her husband  has Alzheimer's.   FAMILY HISTORY:  Coronary artery disease in her mother in her 54s.   REVIEW OF SYSTEMS:  All systems are reviewed in detail and are negative  except as noted in the History of Present Illness.   PHYSICAL EXAMINATION:  VITAL SIGNS:  Temperature 97.5, blood pressure  191/75, heart  rate 46, respiratory rate 12, oxygen saturation 100%.  GENERAL:  A well-developed, well-nourished female, alert and oriented  x3, in no apparent distress.  HEENT:  Atraumatic, normocephalic, pupils equal, round, and reactive to  light, extraocular movements intact.  NECK:  Supple, no adenopathy, no JVD, no carotid bruits.  CHEST:  Lungs are clear to auscultation bilaterally with equal bilateral  breath sounds.  CORONARY:  Regular rhythm, bradycardic, no murmurs, rubs, or gallops.  ABDOMEN:  Soft, nontender, and nondistended.  EXTREMITIES:  No clubbing, cyanosis, or edema.  NEUROLOGIC:  No focal deficits.   LABORATORY DATA:  White blood cell count 5.3, hematocrit 32.2, platelet  count 296, sodium 132, potassium 4.2, chloride 104, bicarb 21, BUN 23,  creatinine 0.7, glucose 100, myoglobin 40, CK-MB less than 1, troponin  less than 0.05.  EKG shows sinus bradycardia.   IMPRESSION:  1. Atypical chest pain.  2. Bradycardia.  3. Hypertension.  PLAN:  1. Admit the patient to telemetry to follow patient's heart rate.      While I talked with the patient, her heart rate was able to go up      to the 60s.  Rule out with serial cardiac enzymes.  Daily EKG,      respiratory, no beta blocker given her heart rate, continue ACE      inhibitor and titrate up for blood pressure control, along with      starting Norvasc for blood pressure control.  Daily EKGs.  2. Endocrine thyroid function tests, continue home dose of Levoxyl.  3. Fluids, electrolytes, and nutrition:  A regular diet.  4. Prophylaxis:  Lovenox for DVT prophylaxis per the pharmacy      protocol.      Rod Holler, MD  Electronically Signed     TRK/MEDQ  D:  09/07/2006  T:  09/08/2006  Job:  [id number]

## 2011-01-18 ENCOUNTER — Emergency Department (HOSPITAL_COMMUNITY): Payer: Medicare Other

## 2011-01-18 ENCOUNTER — Emergency Department (HOSPITAL_COMMUNITY)
Admission: EM | Admit: 2011-01-18 | Discharge: 2011-01-18 | Disposition: A | Discharge status: Home / Self Care | Payer: Medicare Other | Attending: Emergency Medicine | Admitting: Emergency Medicine

## 2011-01-18 ENCOUNTER — Encounter: Payer: Self-pay | Admitting: *Deleted

## 2011-01-18 DIAGNOSIS — K5792 Diverticulitis of intestine, part unspecified, without perforation or abscess without bleeding: Secondary | ICD-10-CM

## 2011-01-18 DIAGNOSIS — M549 Dorsalgia, unspecified: Secondary | ICD-10-CM | POA: Insufficient documentation

## 2011-01-18 DIAGNOSIS — R109 Unspecified abdominal pain: Secondary | ICD-10-CM | POA: Insufficient documentation

## 2011-01-18 DIAGNOSIS — E038 Other specified hypothyroidism: Secondary | ICD-10-CM | POA: Insufficient documentation

## 2011-01-18 DIAGNOSIS — K5732 Diverticulitis of large intestine without perforation or abscess without bleeding: Secondary | ICD-10-CM | POA: Insufficient documentation

## 2011-01-18 DIAGNOSIS — I10 Essential (primary) hypertension: Secondary | ICD-10-CM | POA: Insufficient documentation

## 2011-01-18 DIAGNOSIS — Z79899 Other long term (current) drug therapy: Secondary | ICD-10-CM | POA: Insufficient documentation

## 2011-01-18 HISTORY — DX: Essential (primary) hypertension: I10

## 2011-01-18 LAB — URINALYSIS, ROUTINE W REFLEX MICROSCOPIC
Bilirubin Urine: NEGATIVE
Glucose, UA: NEGATIVE mg/dL
Hgb urine dipstick: NEGATIVE
Ketones, ur: NEGATIVE mg/dL
Leukocytes, UA: NEGATIVE
Nitrite: NEGATIVE
Protein, ur: NEGATIVE mg/dL
Specific Gravity, Urine: 1.01 (ref 1.005–1.030)
Urobilinogen, UA: 0.2 mg/dL (ref 0.0–1.0)
pH: 6 (ref 5.0–8.0)

## 2011-01-18 LAB — DIFFERENTIAL
Basophils Absolute: 0 10*3/uL (ref 0.0–0.1)
Basophils Relative: 0 % (ref 0–1)
Eosinophils Absolute: 0.1 10*3/uL (ref 0.0–0.7)
Eosinophils Relative: 1 % (ref 0–5)
Lymphocytes Relative: 17 % (ref 12–46)
Lymphs Abs: 1.4 K/uL (ref 0.7–4.0)
Monocytes Absolute: 1.2 10*3/uL — ABNORMAL HIGH (ref 0.1–1.0)
Monocytes Relative: 14 % — ABNORMAL HIGH (ref 3–12)
Neutro Abs: 5.6 10*3/uL (ref 1.7–7.7)
Neutrophils Relative %: 68 % (ref 43–77)

## 2011-01-18 LAB — COMPREHENSIVE METABOLIC PANEL
AST: 14 U/L (ref 0–37)
Albumin: 3.1 g/dL — ABNORMAL LOW (ref 3.5–5.2)
Calcium: 9 mg/dL (ref 8.4–10.5)
Chloride: 93 mEq/L — ABNORMAL LOW (ref 96–112)
Creatinine, Ser: 1.26 mg/dL — ABNORMAL HIGH (ref 0.50–1.10)
Total Bilirubin: 0.4 mg/dL (ref 0.3–1.2)
Total Protein: 6.3 g/dL (ref 6.0–8.3)

## 2011-01-18 LAB — CBC
HCT: 25.2 % — ABNORMAL LOW (ref 36.0–46.0)
Hemoglobin: 8.9 g/dL — ABNORMAL LOW (ref 12.0–15.0)
MCH: 34 pg (ref 26.0–34.0)
MCHC: 35.3 g/dL (ref 30.0–36.0)
MCV: 96.2 fL (ref 78.0–100.0)
Platelets: 262 K/uL (ref 150–400)
RBC: 2.62 MIL/uL — ABNORMAL LOW (ref 3.87–5.11)
RDW: 15.5 % (ref 11.5–15.5)
WBC: 8.3 K/uL (ref 4.0–10.5)

## 2011-01-18 LAB — LACTIC ACID, PLASMA: Lactic Acid, Venous: 0.5 mmol/L (ref 0.5–2.2)

## 2011-01-18 LAB — COMPREHENSIVE METABOLIC PANEL WITH GFR
ALT: 8 U/L (ref 0–35)
Alkaline Phosphatase: 63 U/L (ref 39–117)
BUN: 25 mg/dL — ABNORMAL HIGH (ref 6–23)
CO2: 29 meq/L (ref 19–32)
GFR calc Af Amer: 49 mL/min — ABNORMAL LOW (ref >60)
GFR calc non Af Amer: 40 mL/min — ABNORMAL LOW (ref >60)
Glucose, Bld: 112 mg/dL — ABNORMAL HIGH (ref 70–99)
Potassium: 3.8 meq/L (ref 3.5–5.1)
Sodium: 130 meq/L — ABNORMAL LOW (ref 135–145)

## 2011-01-18 LAB — LIPASE, BLOOD: Lipase: 28 U/L (ref 11–59)

## 2011-01-18 MED ORDER — IOHEXOL 300 MG/ML  SOLN
80.0000 mL | Freq: Once | INTRAMUSCULAR | Status: AC | PRN
  Administered 2011-01-18: 80 mL via INTRAVENOUS

## 2011-01-18 MED ORDER — METRONIDAZOLE IN NACL 5-0.79 MG/ML-% IV SOLN
500.0000 mg | Freq: Once | INTRAVENOUS | Status: DC
  Filled 2011-01-18: qty 100

## 2011-01-18 MED ORDER — HYDROCODONE-ACETAMINOPHEN 5-325 MG PO TABS: 1.0000 | ORAL_TABLET | Freq: Four times a day (QID) | ORAL | Status: AC | PRN

## 2011-01-18 MED ORDER — MORPHINE SULFATE 4 MG/ML IJ SOLN
4.0000 mg | Freq: Once | INTRAMUSCULAR | Status: AC
  Administered 2011-01-18: 4 mg via INTRAVENOUS
  Filled 2011-01-18: qty 1

## 2011-01-18 MED ORDER — ONDANSETRON HCL 4 MG PO TABS: ORAL_TABLET | ORAL | Status: DC

## 2011-01-18 MED ORDER — METRONIDAZOLE 500 MG PO TABS: ORAL_TABLET | ORAL | Status: DC

## 2011-01-18 MED ORDER — CEFTRIAXONE SODIUM 1 G IJ SOLR
1.0000 g | Freq: Once | INTRAMUSCULAR | Status: DC
  Filled 2011-01-18: qty 1

## 2011-01-18 MED ORDER — SODIUM CHLORIDE 0.9 % IV SOLN
999.0000 mL | INTRAVENOUS | Status: DC
  Administered 2011-01-18: 18:00:00 via INTRAVENOUS

## 2011-01-18 MED ORDER — CIPROFLOXACIN HCL 500 MG PO TABS: 500.0000 mg | ORAL_TABLET | Freq: Two times a day (BID) | ORAL | Status: AC

## 2011-01-18 NOTE — ED Provider Notes (Signed)
History   Chart scribed for [name]. Oletta Lamas, MD by Enos Fling; the patient was seen in room APAH1/APAH1; this patient's care was started at 5:35 PM.    CSN: [id number] Arrival date & time: 01/18/2011  5:06 PM  Chief Complaint  Patient presents with  . Abdominal Pain  . Back Pain    HPI [name] is a 77 y.o. female who presents to the Emergency Department complaining of abd pain. Pt states generalized abd pain has been intermittent since onset several days ago, but became much worse last night. Pain radiates to lower back and is associated mild abd swelling, constipation, and decreased appetite. She reports some relief of constipation with 2 enemas yesterday. Pt is tolerating food and fluids though states she is not drinking enough water. Pt denies n/v/d or f/c. No h/o abd surgeries. Pt reports urinary frequency for "a while" that is known and treated by PCP. Last colonoscopy approx 1 year ago by Dr. Karilyn Cota.  PCP Dr. Dwana Melena   Past Medical History  Diagnosis Date  . Hypertension   . Thyroid disease     Past Surgical History  Procedure Date  . Thyroidectomy   . Tonsillectomy     History reviewed. No pertinent family history.  History  Substance Use Topics  . Smoking status: Never Smoker   . Smokeless tobacco: Not on file  . Alcohol Use: No    OB History    Grav Para Term Preterm Abortions TAB SAB Ect Mult Living                  Review of Systems 10 Systems reviewed and are negative for acute change except as noted in the HPI.  Allergies  Sulfa antibiotics  Home Medications   Current Outpatient Rx  Name Route Sig Dispense Refill  . ACETAMINOPHEN 500 MG PO TABS Oral Take 500-1,000 mg by mouth as needed. For pain     . ALENDRONATE SODIUM 70 MG PO TABS Oral Take 70 mg by mouth every 7 (seven) days. Take with a full glass of water on an empty stomach on Mondays of each week    . ALPRAZOLAM 0.25 MG PO TABS Oral Take 0.25 mg by mouth daily as needed. For  anxiety     . ASPIRIN EC 81 MG PO TBEC Oral Take 81 mg by mouth daily.      . CHOLECALCIFEROL 400 UNITS PO TABS Oral Take 400 Units by mouth daily.      . CYCLOSPORINE 0.05 % OP EMUL Both Eyes Place 1 drop into both eyes 2 (two) times daily.      Marland Kitchen FLUTICASONE PROPIONATE 50 MCG/ACT NA SUSP Nasal Place 1 spray into the nose daily.      Marland Kitchen HYDRALAZINE HCL 25 MG PO TABS Oral Take 25 mg by mouth 2 (two) times daily.      Marland Kitchen LEVOCETIRIZINE DIHYDROCHLORIDE 5 MG PO TABS Oral Take 5 mg by mouth at bedtime.      Marland Kitchen LEVOTHYROXINE SODIUM 75 MCG PO TABS Oral Take 75 mcg by mouth daily.      Marland Kitchen LISINOPRIL 20 MG PO TABS Oral Take 20 mg by mouth 2 (two) times daily.      . LUBIPROSTONE 24 MCG PO CAPS Oral Take 24 mcg by mouth 2 (two) times daily as needed. For stomach/bowel     . MAGNESIUM HYDROXIDE 400 MG/5ML PO SUSP Oral Take 5 mLs by mouth daily as needed. For stomach relief     .  ONE-A-DAY WOMENS FORMULA PO Oral Take 1 tablet by mouth daily.      Marland Kitchen FISH OIL 1000 MG PO CAPS Oral Take 1 capsule by mouth 2 (two) times daily.      Marland Kitchen POLYETHYLENE GLYCOL 3350 PO POWD Oral Take 17 g by mouth daily.      . PSYLLIUM 95 % PO PACK Oral Take 1 packet by mouth daily.      Bernadette Hoit SODIUM 8.6-50 MG PO TABS Oral Take 2-3 tablets by mouth daily.      Marland Kitchen CIPROFLOXACIN HCL 500 MG PO TABS Oral Take 1 tablet (500 mg total) by mouth 2 (two) times daily. 20 tablet 0  . HYDROCODONE-ACETAMINOPHEN 5-325 MG PO TABS Oral Take 1 tablet by mouth every 6 (six) hours as needed for pain. 10 tablet 0  . METRONIDAZOLE 500 MG PO TABS  1 tablet by mouth 3 times per day for 10 days 30 tablet 0  . ONDANSETRON HCL 4 MG PO TABS  Take 1 tablet by mouth every 6 hours as needed for nausea 12 tablet 0    BP 183/71  Pulse 73  Temp(Src) 98.5 F (36.9 C) (Oral)  Resp 20  SpO2 100%  Physical Exam  Nursing note and vitals reviewed. Constitutional: She is oriented to person, place, and time. She appears well-developed and well-nourished.  No distress.  HENT:  Head: Normocephalic and atraumatic.  Right Ear: External ear normal.  Left Ear: External ear normal.  Nose: Nose normal.  Mouth/Throat: Oropharynx is clear and moist.  Eyes:       Normal appearance.  Neck: Neck supple.  Cardiovascular: Normal rate and regular rhythm.   Pulmonary/Chest: Effort normal and breath sounds normal.  Abdominal: Soft. She exhibits no distension. There is tenderness (diffuse lower abd). There is guarding (mild).       Hyperactive bowel sounds; obturator sign negative  Musculoskeletal: Normal range of motion.       Normal pulses  Neurological: She is alert and oriented to person, place, and time.       Motor intact in all extremities  Skin: Skin is warm and dry.       Color normal  Psychiatric: She has a normal mood and affect.    ED Course  Procedures - none  Labs Reviewed  CBC - Abnormal; Notable for the following:    RBC 2.62 (*)    Hemoglobin 8.9 (*)    HCT 25.2 (*)    All other components within normal limits  DIFFERENTIAL - Abnormal; Notable for the following:    Monocytes Relative 14 (*)    Monocytes Absolute 1.2 (*)    All other components within normal limits  COMPREHENSIVE METABOLIC PANEL - Abnormal; Notable for the following:    Sodium 130 (*)    Chloride 93 (*)    Glucose, Bld 112 (*)    BUN 25 (*)    Creatinine, Ser 1.26 (*)    Albumin 3.1 (*)    GFR calc non Af Amer 40 (*)    GFR calc Af Amer 49 (*)    All other components within normal limits  LIPASE, BLOOD  URINALYSIS, ROUTINE W REFLEX MICROSCOPIC  LACTIC ACID, PLASMA   Ct Abdomen Pelvis W Contrast  01/18/2011  *RADIOLOGY REPORT*  Clinical Data: Low abdominal pain  CT ABDOMEN AND PELVIS WITH CONTRAST  Technique:  Multidetector CT imaging of the abdomen and pelvis was performed following the standard protocol during bolus administration of intravenous contrast.  Contrast:  80mL OMNIPAQUE IOHEXOL 300 MG/ML IV SOLN  Comparison: 08/27/2008  Findings: There are  two 3 mm subpleural nodules in the lateral aspect of the right middle lobe image 5/3.  Remainder of visualized lung bases clear.  There are patchy coronary and aortic calcifications.  Unremarkable liver, gallbladder, spleen, adrenal glands, kidneys, pancreas.  Atheromatous nondilated abdominal aorta.  Stomach is physiologically distended.  Small bowel is nondilated.  Normal appendix.  The colon is nondilated with multiple sigmoid diverticula.  There are adjacent inflammatory/edematous changes about the mid sigmoid colon.  No extraluminal fluid collection or gas.  Calcified uterine fibroids noted.  Urinary bladder physiologically distended.  No ascites.  No free air.  Spondylitic changes in the lumbar spine.  IMPRESSION:  1.  Sigmoid diverticulitis without abscess. 2.  Coronary and aortic calcifications. 3.  Small nonspecific right middle lobe pulmonary nodules.If the patient is at high risk for bronchogenic carcinoma, follow-up chest CT at 1 year is recommended.  If the patient is at low risk, no follow-up is needed.  This recommendation follows the consensus statement: Guidelines for Management of Small Pulmonary Nodules Detected on CT Scans:  A Statement from the Fleischner Society as published in Radiology 2005; 237:395-400.  Available online at: DietDisorder.cz.  Original Report Authenticated By: Osa Craver, M.D.    9:05 PM - All results reviewed and discussed, questions answered, pt and family would prefer abx at home with close PCP f/u, rather than admission.   OTHER DATA REVIEWED: Nursing notes and vital signs reviewed. Prior records reviewed.  MEDS GIVEN IN ED: metroNIDAZOLE (FLAGYL) IVPB 500 mg (not administered)  cefTRIAXone (ROCEPHIN) 1 g in dextrose 5 % 50 mL IVPB (not administered)  morphine 4 MG/ML injection 4 mg (4 mg Intravenous Given 01/18/11 1817)  iohexol (OMNIPAQUE) 300 MG/ML injection 80 mL (80 mL Intravenous Contrast Given 01/18/11  2044)     MDM  Pt with lower abd diffuse pain adn tenderness on exam, slight guard, no rebound.  Will treat symptoms and obtain CT scan once Cr is back to assess for possibly surgical problem or I think more likely, diverticulitis.  UA also pending.    IMPRESSION: 1. Diverticulitis     DISCHARGE MEDICATIONS: New Prescriptions   CIPROFLOXACIN (CIPRO) 500 MG TABLET    Take 1 tablet (500 mg total) by mouth 2 (two) times daily.   HYDROCODONE-ACETAMINOPHEN (NORCO) 5-325 MG PER TABLET    Take 1 tablet by mouth every 6 (six) hours as needed for pain.   METRONIDAZOLE (FLAGYL) 500 MG TABLET    1 tablet by mouth 3 times per day for 10 days   ONDANSETRON (ZOFRAN) 4 MG TABLET    Take 1 tablet by mouth every 6 hours as needed for nausea    SCRIBE ATTESTATION: I personally performed the services described in this documentation, which was scribed in my presence. The recorded information has been reviewed and considered. Ronnald Shedden Y.      9:21 PM Pt's abd is soft, still somewhat bloated.  Pt and family given option for admission, however they feel comfortable being treated at home with close outpt follow up.  Pain is bearable, no vomiting.  First IV dose of abx given here.    [name]. Oletta Lamas, MD 01/18/11 2127

## 2011-01-18 NOTE — ED Notes (Signed)
Finished contrast, to ct.

## 2011-01-18 NOTE — ED Notes (Signed)
Pt c/o general abd pain and back pain; pt states she has been having trouble with BM's and  Took two enemas yesterday with some results

## 2011-01-18 NOTE — ED Notes (Signed)
Patient unable to urinate at this time. 

## 2011-04-30 DIAGNOSIS — H43819 Vitreous degeneration, unspecified eye: Secondary | ICD-10-CM | POA: Diagnosis not present

## 2011-04-30 DIAGNOSIS — H35059 Retinal neovascularization, unspecified, unspecified eye: Secondary | ICD-10-CM | POA: Diagnosis not present

## 2011-04-30 DIAGNOSIS — H35369 Drusen (degenerative) of macula, unspecified eye: Secondary | ICD-10-CM | POA: Diagnosis not present

## 2011-04-30 DIAGNOSIS — H353190 Nonexudative age-related macular degeneration, unspecified eye, stage unspecified: Secondary | ICD-10-CM | POA: Diagnosis not present

## 2011-06-03 ENCOUNTER — Other Ambulatory Visit: Payer: Self-pay

## 2011-06-03 ENCOUNTER — Encounter (HOSPITAL_COMMUNITY): Payer: Self-pay

## 2011-06-03 ENCOUNTER — Inpatient Hospital Stay (HOSPITAL_COMMUNITY)
Admission: EM | Admit: 2011-06-03 | Discharge: 2011-06-08 | DRG: 244 | Disposition: A | Discharge status: Nursing Home (Includes ICF) | Payer: Medicare Other | Attending: Internal Medicine | Admitting: Internal Medicine

## 2011-06-03 ENCOUNTER — Emergency Department (HOSPITAL_COMMUNITY): Payer: Medicare Other

## 2011-06-03 DIAGNOSIS — N289 Disorder of kidney and ureter, unspecified: Secondary | ICD-10-CM | POA: Diagnosis present

## 2011-06-03 DIAGNOSIS — I10 Essential (primary) hypertension: Secondary | ICD-10-CM | POA: Diagnosis not present

## 2011-06-03 DIAGNOSIS — R918 Other nonspecific abnormal finding of lung field: Secondary | ICD-10-CM | POA: Diagnosis not present

## 2011-06-03 DIAGNOSIS — D649 Anemia, unspecified: Secondary | ICD-10-CM | POA: Diagnosis present

## 2011-06-03 DIAGNOSIS — I498 Other specified cardiac arrhythmias: Principal | ICD-10-CM | POA: Diagnosis present

## 2011-06-03 DIAGNOSIS — M810 Age-related osteoporosis without current pathological fracture: Secondary | ICD-10-CM | POA: Diagnosis present

## 2011-06-03 DIAGNOSIS — K5900 Constipation, unspecified: Secondary | ICD-10-CM | POA: Diagnosis present

## 2011-06-03 DIAGNOSIS — R079 Chest pain, unspecified: Secondary | ICD-10-CM | POA: Diagnosis not present

## 2011-06-03 DIAGNOSIS — H3530 Unspecified macular degeneration: Secondary | ICD-10-CM | POA: Diagnosis present

## 2011-06-03 DIAGNOSIS — E039 Hypothyroidism, unspecified: Secondary | ICD-10-CM | POA: Diagnosis not present

## 2011-06-03 DIAGNOSIS — E785 Hyperlipidemia, unspecified: Secondary | ICD-10-CM | POA: Diagnosis present

## 2011-06-03 DIAGNOSIS — R0789 Other chest pain: Secondary | ICD-10-CM | POA: Diagnosis not present

## 2011-06-03 DIAGNOSIS — Z7982 Long term (current) use of aspirin: Secondary | ICD-10-CM

## 2011-06-03 DIAGNOSIS — Z79899 Other long term (current) drug therapy: Secondary | ICD-10-CM

## 2011-06-03 DIAGNOSIS — R10816 Epigastric abdominal tenderness: Secondary | ICD-10-CM | POA: Diagnosis not present

## 2011-06-03 DIAGNOSIS — R001 Bradycardia, unspecified: Secondary | ICD-10-CM | POA: Diagnosis present

## 2011-06-03 DIAGNOSIS — M1990 Unspecified osteoarthritis, unspecified site: Secondary | ICD-10-CM | POA: Diagnosis present

## 2011-06-03 DIAGNOSIS — I495 Sick sinus syndrome: Secondary | ICD-10-CM | POA: Diagnosis not present

## 2011-06-03 HISTORY — DX: Anemia, unspecified: D64.9

## 2011-06-03 HISTORY — DX: Unspecified osteoarthritis, unspecified site: M19.90

## 2011-06-03 HISTORY — DX: Hypothyroidism, unspecified: E03.9

## 2011-06-03 HISTORY — DX: Residual hemorrhoidal skin tags: K64.4

## 2011-06-03 HISTORY — DX: Diverticulosis of intestine, part unspecified, without perforation or abscess without bleeding: K57.90

## 2011-06-03 HISTORY — DX: Bradycardia, unspecified: R00.1

## 2011-06-03 HISTORY — DX: Unspecified macular degeneration: H35.30

## 2011-06-03 HISTORY — DX: Other seasonal allergic rhinitis: J30.2

## 2011-06-03 HISTORY — DX: Hyperlipidemia, unspecified: E78.5

## 2011-06-03 HISTORY — DX: Palpitations: R00.2

## 2011-06-03 LAB — BASIC METABOLIC PANEL
BUN: 21 mg/dL (ref 6–23)
Calcium: 9.6 mg/dL (ref 8.4–10.5)
Creatinine, Ser: 1.25 mg/dL — ABNORMAL HIGH (ref 0.50–1.10)
GFR calc Af Amer: 44 mL/min — ABNORMAL LOW (ref >90)
GFR calc non Af Amer: 38 mL/min — ABNORMAL LOW (ref >90)

## 2011-06-03 LAB — DIFFERENTIAL
Basophils Relative: 1 % (ref 0–1)
Eosinophils Absolute: 0.2 10*3/uL (ref 0.0–0.7)
Monocytes Absolute: 0.6 10*3/uL (ref 0.1–1.0)
Monocytes Relative: 11 % (ref 3–12)
Neutrophils Relative %: 43 % (ref 43–77)

## 2011-06-03 LAB — CARDIAC PANEL(CRET KIN+CKTOT+MB+TROPI)
Relative Index: 1.8 (ref 0.0–2.5)
Total CK: 115 U/L (ref 7–177)
Troponin I: <0.3 ng/mL (ref <0.30)

## 2011-06-03 LAB — CBC
HCT: 34.5 % — ABNORMAL LOW (ref 36.0–46.0)
HCT: 30.7 % — ABNORMAL LOW (ref 36.0–46.0)
Hemoglobin: 11.7 g/dL — ABNORMAL LOW (ref 12.0–15.0)
Hemoglobin: 10.4 g/dL — ABNORMAL LOW (ref 12.0–15.0)
MCH: 33.3 pg (ref 26.0–34.0)
MCHC: 33.9 g/dL (ref 30.0–36.0)
MCHC: 33.9 g/dL (ref 30.0–36.0)
MCV: 97.2 fL (ref 78.0–100.0)
WBC: 4.4 10*3/uL (ref 4.0–10.5)

## 2011-06-03 LAB — CREATININE, SERUM
GFR calc Af Amer: 47 mL/min — ABNORMAL LOW (ref >90)
GFR calc non Af Amer: 41 mL/min — ABNORMAL LOW (ref >90)

## 2011-06-03 LAB — PHOSPHORUS: Phosphorus: 3.3 mg/dL (ref 2.3–4.6)

## 2011-06-03 LAB — TSH: TSH: 11.896 u[IU]/mL — ABNORMAL HIGH (ref 0.350–4.500)

## 2011-06-03 LAB — HEPATIC FUNCTION PANEL
Bilirubin, Direct: <0.1 mg/dL (ref 0.0–0.3)
Total Bilirubin: 0.4 mg/dL (ref 0.3–1.2)

## 2011-06-03 LAB — LIPASE, BLOOD: Lipase: 55 U/L (ref 11–59)

## 2011-06-03 MED ORDER — AMLODIPINE BESYLATE 2.5 MG PO TABS
2.5000 mg | ORAL_TABLET | Freq: Every day | ORAL | Status: DC
  Administered 2011-06-04 – 2011-06-07 (×4): 2.5 mg via ORAL
  Filled 2011-06-03 (×4): qty 1

## 2011-06-03 MED ORDER — POTASSIUM CHLORIDE IN NACL 20-0.9 MEQ/L-% IV SOLN
INTRAVENOUS | Status: DC
  Administered 2011-06-03 – 2011-06-04 (×2): via INTRAVENOUS

## 2011-06-03 MED ORDER — FLUTICASONE PROPIONATE 50 MCG/ACT NA SUSP: 1.0000 | Freq: Every day | NASAL | Status: DC | PRN

## 2011-06-03 MED ORDER — TRAMADOL HCL 50 MG PO TABS: 50.0000 mg | ORAL_TABLET | Freq: Two times a day (BID) | ORAL | Status: DC | PRN

## 2011-06-03 MED ORDER — LUBIPROSTONE 24 MCG PO CAPS
24.0000 ug | ORAL_CAPSULE | Freq: Two times a day (BID) | ORAL | Status: DC | PRN
  Filled 2011-06-03: qty 1

## 2011-06-03 MED ORDER — MAGNESIUM HYDROXIDE 400 MG/5ML PO SUSP: 30.0000 mL | Freq: Every day | ORAL | Status: DC | PRN

## 2011-06-03 MED ORDER — DOCUSATE SODIUM 100 MG PO CAPS: 100.0000 mg | ORAL_CAPSULE | Freq: Every day | ORAL | Status: DC

## 2011-06-03 MED ORDER — ACETAMINOPHEN 325 MG PO TABS
650.0000 mg | ORAL_TABLET | Freq: Three times a day (TID) | ORAL | Status: DC | PRN
  Administered 2011-06-04 – 2011-06-05 (×2): 650 mg via ORAL
  Filled 2011-06-03 (×2): qty 2

## 2011-06-03 MED ORDER — PANTOPRAZOLE SODIUM 40 MG PO TBEC
40.0000 mg | DELAYED_RELEASE_TABLET | Freq: Every day | ORAL | Status: DC
  Administered 2011-06-03 – 2011-06-07 (×5): 40 mg via ORAL
  Filled 2011-06-03 (×5): qty 1

## 2011-06-03 MED ORDER — POLYETHYLENE GLYCOL 3350 17 GM/SCOOP PO POWD
17.0000 g | Freq: Every day | ORAL | Status: DC
  Filled 2011-06-03: qty 255

## 2011-06-03 MED ORDER — OMEGA-3-ACID ETHYL ESTERS 1 G PO CAPS
1.0000 g | ORAL_CAPSULE | Freq: Every day | ORAL | Status: DC
  Administered 2011-06-04 – 2011-06-06 (×3): 1 g via ORAL
  Filled 2011-06-03 (×4): qty 1

## 2011-06-03 MED ORDER — ISOSORBIDE MONONITRATE 15 MG HALF TABLET
15.0000 mg | ORAL_TABLET | Freq: Every day | ORAL | Status: DC
  Administered 2011-06-03 – 2011-06-07 (×5): 15 mg via ORAL
  Filled 2011-06-03 (×5): qty 1

## 2011-06-03 MED ORDER — CYCLOSPORINE 0.05 % OP EMUL
1.0000 [drp] | Freq: Two times a day (BID) | OPHTHALMIC | Status: DC
  Administered 2011-06-04 – 2011-06-07 (×6): 1 [drp] via OPHTHALMIC
  Filled 2011-06-03 (×12): qty 1

## 2011-06-03 MED ORDER — ADULT MULTIVITAMIN W/MINERALS CH
1.0000 | ORAL_TABLET | Freq: Every morning | ORAL | Status: DC
  Administered 2011-06-04 – 2011-06-06 (×4): 1 via ORAL
  Filled 2011-06-03 (×4): qty 1

## 2011-06-03 MED ORDER — LEVOTHYROXINE SODIUM 75 MCG PO TABS
75.0000 ug | ORAL_TABLET | Freq: Every day | ORAL | Status: DC
  Administered 2011-06-04: 75 ug via ORAL
  Filled 2011-06-03: qty 1

## 2011-06-03 MED ORDER — MORPHINE SULFATE 2 MG/ML IJ SOLN: 2.0000 mg | INTRAMUSCULAR | Status: DC | PRN

## 2011-06-03 MED ORDER — ALPRAZOLAM 0.5 MG PO TABS
0.5000 mg | ORAL_TABLET | Freq: Every evening | ORAL | Status: DC | PRN
  Administered 2011-06-03 – 2011-06-04 (×2): 0.5 mg via ORAL
  Filled 2011-06-03 (×2): qty 1

## 2011-06-03 MED ORDER — POLYETHYLENE GLYCOL 3350 17 G PO PACK
PACK | ORAL | Status: AC
  Filled 2011-06-03: qty 1

## 2011-06-03 MED ORDER — CHOLECALCIFEROL 10 MCG (400 UNIT) PO TABS
400.0000 [IU] | ORAL_TABLET | Freq: Every day | ORAL | Status: DC
  Administered 2011-06-04 – 2011-06-06 (×3): 400 [IU] via ORAL
  Filled 2011-06-03 (×4): qty 1

## 2011-06-03 MED ORDER — OCUVITE PO TABS
1.0000 | ORAL_TABLET | Freq: Two times a day (BID) | ORAL | Status: DC
  Administered 2011-06-04 – 2011-06-06 (×5): 1 via ORAL
  Filled 2011-06-03 (×10): qty 1

## 2011-06-03 MED ORDER — ALUM & MAG HYDROXIDE-SIMETH 200-200-20 MG/5ML PO SUSP
15.0000 mL | Freq: Once | ORAL | Status: AC
  Administered 2011-06-03: 15 mL via ORAL
  Filled 2011-06-03: qty 30

## 2011-06-03 MED ORDER — PSYLLIUM 95 % PO PACK
1.0000 | PACK | Freq: Every day | ORAL | Status: DC
  Administered 2011-06-04 – 2011-06-06 (×3): 1 via ORAL
  Filled 2011-06-03 (×6): qty 1

## 2011-06-03 MED ORDER — LORATADINE 10 MG PO TABS
10.0000 mg | ORAL_TABLET | Freq: Every day | ORAL | Status: DC
  Administered 2011-06-03 – 2011-06-06 (×4): 10 mg via ORAL
  Filled 2011-06-03 (×5): qty 1

## 2011-06-03 MED ORDER — ONDANSETRON HCL 4 MG PO TABS: 4.0000 mg | ORAL_TABLET | Freq: Four times a day (QID) | ORAL | Status: DC | PRN

## 2011-06-03 MED ORDER — MAGNESIUM OXIDE 400 MG PO TABS
400.0000 mg | ORAL_TABLET | Freq: Every day | ORAL | Status: DC
  Administered 2011-06-04 – 2011-06-06 (×3): 400 mg via ORAL
  Filled 2011-06-03 (×4): qty 1

## 2011-06-03 MED ORDER — LISINOPRIL 10 MG PO TABS
20.0000 mg | ORAL_TABLET | Freq: Two times a day (BID) | ORAL | Status: DC
  Administered 2011-06-03 – 2011-06-07 (×8): 20 mg via ORAL
  Filled 2011-06-03 (×9): qty 2

## 2011-06-03 MED ORDER — ALUM & MAG HYDROXIDE-SIMETH 200-200-20 MG/5ML PO SUSP: 15.0000 mL | Freq: Four times a day (QID) | ORAL | Status: DC | PRN

## 2011-06-03 MED ORDER — TRIAMCINOLONE ACETONIDE 0.5 % EX CREA
1.0000 "application " | TOPICAL_CREAM | Freq: Two times a day (BID) | CUTANEOUS | Status: DC | PRN
  Filled 2011-06-03: qty 15

## 2011-06-03 MED ORDER — SENNA 8.6 MG PO TABS
1.0000 | ORAL_TABLET | Freq: Two times a day (BID) | ORAL | Status: DC
  Administered 2011-06-03 – 2011-06-06 (×7): 8.6 mg via ORAL
  Filled 2011-06-03 (×7): qty 1
  Filled 2011-06-03: qty 2
  Filled 2011-06-03: qty 1

## 2011-06-03 MED ORDER — PSYLLIUM 95 % PO PACK
1.0000 | PACK | Freq: Every day | ORAL | Status: DC
  Filled 2011-06-03 (×2): qty 1

## 2011-06-03 MED ORDER — MAGNESIUM 250 MG PO TABS: 1.0000 | ORAL_TABLET | Freq: Every day | ORAL | Status: DC

## 2011-06-03 MED ORDER — ALPRAZOLAM 0.25 MG PO TABS
0.2500 mg | ORAL_TABLET | Freq: Three times a day (TID) | ORAL | Status: DC | PRN
  Filled 2011-06-03: qty 1

## 2011-06-03 MED ORDER — ALBUTEROL SULFATE (5 MG/ML) 0.5% IN NEBU: 2.5000 mg | INHALATION_SOLUTION | RESPIRATORY_TRACT | Status: DC | PRN

## 2011-06-03 MED ORDER — ENOXAPARIN SODIUM 30 MG/0.3ML ~~LOC~~ SOLN
30.0000 mg | SUBCUTANEOUS | Status: DC
  Administered 2011-06-03: 30 mg via SUBCUTANEOUS
  Filled 2011-06-03: qty 0.3

## 2011-06-03 MED ORDER — ONDANSETRON HCL 4 MG/2ML IJ SOLN: 4.0000 mg | Freq: Four times a day (QID) | INTRAMUSCULAR | Status: DC | PRN

## 2011-06-03 MED ORDER — HYDRALAZINE HCL 25 MG PO TABS
25.0000 mg | ORAL_TABLET | Freq: Two times a day (BID) | ORAL | Status: DC
  Administered 2011-06-03 – 2011-06-07 (×8): 25 mg via ORAL
  Filled 2011-06-03 (×9): qty 1

## 2011-06-03 MED ORDER — ASPIRIN EC 81 MG PO TBEC
81.0000 mg | DELAYED_RELEASE_TABLET | Freq: Every day | ORAL | Status: DC
  Administered 2011-06-04 – 2011-06-07 (×4): 81 mg via ORAL
  Filled 2011-06-03 (×4): qty 1

## 2011-06-03 MED ORDER — GUAIFENESIN-DM 100-10 MG/5ML PO SYRP: 5.0000 mL | ORAL_SOLUTION | ORAL | Status: DC | PRN

## 2011-06-03 MED ORDER — POLYETHYLENE GLYCOL 3350 17 GM/SCOOP PO POWD
17.0000 g | Freq: Every day | ORAL | Status: DC
  Administered 2011-06-03: 17 g via ORAL
  Filled 2011-06-03: qty 255

## 2011-06-03 NOTE — H&P (Addendum)
[name] MRN: [id number] DOB/AGE: 30-Dec-1922 78 y.o. Primary Care Physician:HALL,ZACK, MD, MD Admit date: 06/03/2011 Chief Complaint: Chest tightness. HPI: The patient is an 78 year old woman with a past medical history significant for hypertension, hypothyroidism, and bradycardia, who presents to the emergency department today with a chief complaint of chest tightness. The chest tightness started abruptly earlier today while she was sitting in Sunday school class. It was 10 over 10 in intensity. She describes the pain as a tightness. It lasted for 5-10 minutes before easing off. It was associated with nausea but no vomiting. She had lightheadedness for a few minutes. She had some radiation of the chest tightness across her upper chest and substernal area to the left shoulder. She had mild palpitations. She denies associated shortness of breath or pleurisy. She has had some indigestion lately. She has had no recent upper respiratory infection symptoms, cough, flulike symptoms, or swelling in her legs.  In the emergency department, she is noted to be afebrile and bradycardic with a heart rate ranging from 40-48 beats per minute. Her blood pressure is in the 160s systolically. Her chest x-ray reveals chronic interstitial markings but no acute cardiopulmonary process. Her EKG reveals sinus bradycardia with a heart rate of 54 beats per minute. Her lab data are significant for a creatinine of 1.25, normal troponin I of 0.30, and a hemoglobin of 11.7. She is being admitted for further evaluation and management.  Past Medical History  Diagnosis Date  . Hypertension   . Hypothyroidism   . Bradycardia   . Anemia   . Diverticulosis   . External hemorrhoids   . Constipation   . Macular degeneration   . Seasonal allergies   . Osteoporosis   . Degenerative joint disease   . Palpitations   . Hyperlipidemia     Past Surgical History  Procedure Date  . Thyroidectomy   . Tonsillectomy   . Colonoscopy  November 2011    External hemorrhoids, diverticulosis, anal papillae    Prior to Admission medications   Medication Sig Start Date End Date Taking? Authorizing Provider  acetaminophen (TYLENOL) 650 MG CR tablet Take 650 mg by mouth every 8 (eight) hours as needed. For pain   Yes Historical Provider, MD  alendronate (FOSAMAX) 70 MG tablet Take 70 mg by mouth every 7 (seven) days. Take with a full glass of water on an empty stomach on Fridays of each week   Yes Historical Provider, MD  amLODipine (NORVASC) 2.5 MG tablet Take 2.5 mg by mouth daily.   Yes Historical Provider, MD  aspirin EC 81 MG tablet Take 81 mg by mouth daily.     Yes Historical Provider, MD  beta carotene w/minerals (OCUVITE) tablet Take 1 tablet by mouth 2 (two) times daily.   Yes Historical Provider, MD  cetirizine (ZYRTEC) 10 MG tablet Take 10 mg by mouth daily.   Yes Historical Provider, MD  docusate sodium (COLACE) 100 MG capsule Take 100 mg by mouth at bedtime.   Yes Historical Provider, MD  guaiFENesin-dextromethorphan (ROBITUSSIN DM) 100-10 MG/5ML syrup Take 5 mLs by mouth every 4 (four) hours as needed. For cough   Yes Historical Provider, MD  hydrALAZINE (APRESOLINE) 25 MG tablet Take 25 mg by mouth 2 (two) times daily.     Yes Historical Provider, MD  lisinopril (PRINIVIL,ZESTRIL) 20 MG tablet Take 20 mg by mouth 2 (two) times daily.     Yes Historical Provider, MD  Magnesium 250 MG TABS Take 1 tablet by mouth  daily.   Yes Historical Provider, MD  Multiple Vitamins-Calcium (ONE-A-DAY WOMENS FORMULA PO) Take 1 tablet by mouth daily.     Yes Historical Provider, MD  Omega-3 Fatty Acids (FISH OIL) 1000 MG CAPS Take 1 capsule by mouth 2 (two) times daily.     Yes Historical Provider, MD  polyethylene glycol powder (GLYCOLAX/MIRALAX) powder Take 17 g by mouth daily.     Yes Historical Provider, MD  traMADol (ULTRAM) 50 MG tablet Take 50 mg by mouth 2 (two) times daily as needed. For pain   Yes Historical Provider, MD    triamcinolone cream (KENALOG) 0.5 % Apply 1 application topically 2 (two) times daily as needed. For rash   Yes Historical Provider, MD  ALPRAZolam (XANAX) 0.25 MG tablet Take 0.25 mg by mouth every 8 (eight) hours as needed. For anxiety    Historical Provider, MD  cycloSPORINE (RESTASIS) 0.05 % ophthalmic emulsion Place 1 drop into both eyes 2 (two) times daily.      Historical Provider, MD  fluticasone (FLONASE) 50 MCG/ACT nasal spray Place 1 spray into the nose daily as needed. For allergies    Historical Provider, MD  lubiprostone (AMITIZA) 24 MCG capsule Take 24 mcg by mouth 2 (two) times daily as needed. For stomach/bowel     Historical Provider, MD  magnesium hydroxide (MILK OF MAGNESIA) 400 MG/5ML suspension Take 30 mLs by mouth daily as needed. For stomach relief    Historical Provider, MD    Allergies:  Allergies  Allergen Reactions  . Sulfa Antibiotics Nausea And Vomiting   Family history: Her father died of colon cancer. Her mother died of congestive heart failure. She had diabetes mellitus and gallbladder disease. Her brother died of metastatic lung cancer.  Social History: She is widowed. She lives at [place] [facility]. She is retired from [employer]. She denies tobacco, alcohol, and illicit drug use. She walks about 1 mile daily at least 3 days weekly.     ROS: Her review of systems is positive for chest pain with increased ambulation and resolution of chest pain with resting during her mile walks. Also positive for nocturia, constipation, and decreasing visual acuity. Review of systems is positive as above in history present illness. Otherwise review of systems is negative.  PHYSICAL EXAM: Blood pressure 162/58, pulse 48, resp. rate 18, height 5\' 5"  (1.651 m), weight 66.679 kg (147 lb), SpO2 100.00%. General: Very pleasant elderly 78 year old woman who is currently lying in bed in no acute distress. HEENT: Head normocephalic and nontraumatic. Pupils are equal, round, and  reactive to light. Extraocular movements are intact. Conjunctivae are clear. Sclerae are white. Tympanic membranes are obscured by bilateral hearing aids which were not taken out. Nasal mucosa is dry. Oropharynx reveals good dentition. Mucous membranes are mildly dry. No posterior exudates or erythema. Neck: Supple, no adenopathy, no thyromegaly, JVD. Heart: S1, S2, with a soft systolic murmur and bradycardia. Lungs: Decreased breath sounds in the bases, otherwise clear. Breathing is nonlabored. Abdomen: Positive bowel sounds, soft, mildly tender in the epigastrium, no rebound, no guarding, no distention. Extremities: Pedal pulses barely palpable. No pretibial edema and no pedal edema. No calf tenderness. Neurologic: She is alert and oriented x3. Cranial nerves II through XII are intact with exception of decrease in gross visual acuity and mild decrease in hearing acuity.  Basic Metabolic Panel:  Basename 06/03/11 1056  NA 136  K 4.1  CL 99  CO2 28  GLUCOSE 96  BUN 21  CREATININE 1.25*  CALCIUM 9.6  MG --  PHOS --   Liver Function Tests:  Basename 06/03/11 1110  AST 24  ALT 15  ALKPHOS 67  BILITOT 0.4  PROT 6.8  ALBUMIN 3.8    Basename 06/03/11 1110  LIPASE 55  AMYLASE --   No results found for this basename: AMMONIA:2 in the last 72 hours CBC:  Basename 06/03/11 1056  WBC 5.2  NEUTROABS 2.2  HGB 11.7*  HCT 34.5*  MCV 98.3  PLT 301   Cardiac Enzymes:  Basename 06/03/11 1056  CKTOTAL --  CKMB --  CKMBINDEX --  TROPONINI <0.30   BNP: No results found for this basename: PROBNP:3 in the last 72 hours D-Dimer: No results found for this basename: DDIMER:2 in the last 72 hours CBG: No results found for this basename: GLUCAP:6 in the last 72 hours Hemoglobin A1C: No results found for this basename: HGBA1C in the last 72 hours Fasting Lipid Panel: No results found for this basename: CHOL,HDL,LDLCALC,TRIG,CHOLHDL,LDLDIRECT in the last 72 hours Thyroid Function  Tests: No results found for this basename: TSH,T4TOTAL,FREET4,T3FREE,THYROIDAB in the last 72 hours Anemia Panel: No results found for this basename: VITAMINB12,FOLATE,FERRITIN,TIBC,IRON,RETICCTPCT in the last 72 hours Coagulation: No results found for this basename: LABPROT:2,INR:2 in the last 72 hours Urine Drug Screen: Drugs of Abuse  No results found for this basename: labopia,  cocainscrnur,  labbenz,  amphetmu,  thcu,  labbarb    Alcohol Level: No results found for this basename: ETH:2 in the last 72 hours Urinalysis: No results found for this basename: COLORURINE:2,APPERANCEUR:2,LABSPEC:2,PHURINE:2,GLUCOSEU:2,HGBUR:2,BILIRUBINUR:2,KETONESUR:2,PROTEINUR:2,UROBILINOGEN:2,NITRITE:2,LEUKOCYTESUR:2 in the last 72 hours Misc. Labs:   EKG: Sinus bradycardia with a heart rate of 54 beats per minute.  No results found for this or any previous visit (from the past 240 hour(s)).   Results for orders placed during the hospital encounter of 06/03/11 (from the past 48 hour(s))  CBC     Status: Abnormal   Collection Time   06/03/11 10:56 AM      Component Value Range Comment   WBC 5.2  4.0 - 10.5 (K/uL)    RBC 3.51 (*) 3.87 - 5.11 (MIL/uL)    Hemoglobin 11.7 (*) 12.0 - 15.0 (g/dL)    HCT 16.1 (*) 09.6 - 46.0 (%)    MCV 98.3  78.0 - 100.0 (fL)    MCH 33.3  26.0 - 34.0 (pg)    MCHC 33.9  30.0 - 36.0 (g/dL)    RDW 04.5  40.9 - 81.1 (%)    Platelets 301  150 - 400 (K/uL)   DIFFERENTIAL     Status: Normal   Collection Time   06/03/11 10:56 AM      Component Value Range Comment   Neutrophils Relative 43  43 - 77 (%)    Neutro Abs 2.2  1.7 - 7.7 (K/uL)    Lymphocytes Relative 41  12 - 46 (%)    Lymphs Abs 2.1  0.7 - 4.0 (K/uL)    Monocytes Relative 11  3 - 12 (%)    Monocytes Absolute 0.6  0.1 - 1.0 (K/uL)    Eosinophils Relative 4  0 - 5 (%)    Eosinophils Absolute 0.2  0.0 - 0.7 (K/uL)    Basophils Relative 1  0 - 1 (%)    Basophils Absolute 0.1  0.0 - 0.1 (K/uL)   BASIC METABOLIC  PANEL     Status: Abnormal   Collection Time   06/03/11 10:56 AM      Component Value Range Comment  Sodium 136  135 - 145 (mEq/L)    Potassium 4.1  3.5 - 5.1 (mEq/L)    Chloride 99  96 - 112 (mEq/L)    CO2 28  19 - 32 (mEq/L)    Glucose, Bld 96  70 - 99 (mg/dL)    BUN 21  6 - 23 (mg/dL)    Creatinine, Ser 4.09 (*) 0.50 - 1.10 (mg/dL)    Calcium 9.6  8.4 - 10.5 (mg/dL)    GFR calc non Af Amer 38 (*) >90 (mL/min)    GFR calc Af Amer 44 (*) >90 (mL/min)   TROPONIN I     Status: Normal   Collection Time   06/03/11 10:56 AM      Component Value Range Comment   Troponin I <0.30  <0.30 (ng/mL)   HEPATIC FUNCTION PANEL     Status: Normal   Collection Time   06/03/11 11:10 AM      Component Value Range Comment   Total Protein 6.8  6.0 - 8.3 (g/dL)    Albumin 3.8  3.5 - 5.2 (g/dL)    AST 24  0 - 37 (U/L)    ALT 15  0 - 35 (U/L)    Alkaline Phosphatase 67  39 - 117 (U/L)    Total Bilirubin 0.4  0.3 - 1.2 (mg/dL)    Bilirubin, Direct <8.1  0.0 - 0.3 (mg/dL) RESULT REPEATED AND VERIFIED   Indirect Bilirubin NOT CALCULATED  0.3 - 0.9 (mg/dL)   LIPASE, BLOOD     Status: Normal   Collection Time   06/03/11 11:10 AM      Component Value Range Comment   Lipase 55  11 - 59 (U/L)     Dg Chest Portable 1 View  06/03/2011  *RADIOLOGY REPORT*  Clinical Data: Chest pain  PORTABLE CHEST - 1 VIEW  Comparison: 04/28/2010  Findings: Chronic interstitial markings/emphysematous changes. No pleural effusion or pneumothorax.  The heart is top normal in size.  IMPRESSION: No evidence of acute cardiopulmonary disease.  Chronic interstitial markings/emphysematous changes.  Original Report Authenticated By: Charline Bills, M.D.    Impression:  Active Problems:  ANEMIA  Chest tightness  Bradycardia  Hypertension  Acute renal insufficiency  Epigastric abdominal tenderness   1. Chest tightness. Myocardial infarction will be ruled out. Her initial troponin I is negative. Her EKG reveals sinus  bradycardia with no significant ST or T wave abnormalities.  2. Bradycardia. She has been bradycardic for at least a couple of years. During the previous hospitalization in September 2011, the cardiology team did not believe she needed a pacemaker. Her heart urologist is Dr. Dietrich Pates, however, she has not seen him in one year. Next  3. Epigastric abdominal tenderness. This will be evaluated further. Of note, her LFTs and lipase are within normal limits.  4. Hypertension. She is mildly hypertensive. She is treated with Norvasc, hydralazine, and lisinopril chronically. Will monitor her blood pressure over the next 24-48 hours before adjusting these medications.  5. Mild normocytic anemia. She has a history of hemorrhoids and diverticulosis. No recent black tarry stools or bright red blood per rectum by history. Next  6. Acute renal insufficiency. Her creatinine is mildly elevated in the setting of ACE inhibitor therapy.   Plan:  1. Will start Protonix and Imdur empirically. We'll start gentle IV fluids. 2. As needed morphine and as needed Zofran for pain and nausea respectively. 3. For further evaluation, we'll order cardiac enzymes, 2-D echocardiogram, magnesium level, phosphorus level, fasting  lipid panel, TSH, free T4, and an ultrasound of her abdomen. 4. Will consult cardiology in the morning.      Cynitha Berte 06/03/2011, 3:51 PM

## 2011-06-03 NOTE — ED Notes (Signed)
Pt reports was at church and had sudden onset of squeezing pain in center of chest radiating to left shoulder.  REports has had some intermittent SOB.

## 2011-06-03 NOTE — ED Notes (Signed)
Dr.fisher in exam room w/ [name], son at bedside.  Snack given to [name]. (sprite and graham crackers)

## 2011-06-03 NOTE — ED Notes (Signed)
Pt was in church today and began having "chest pressure", sob at times, but not today, mild nausea at onset, all pain/pressure has now resolved.  Pt alert, stated she has been having palpitations off/on for years, she has been followed by dr.rothbart.

## 2011-06-03 NOTE — ED Notes (Signed)
Dr.Fisher paged to Dr.Zammit at [phone]

## 2011-06-03 NOTE — ED Notes (Signed)
Pt  Resting in bed, resp even, denies any pain

## 2011-06-03 NOTE — ED Provider Notes (Cosign Needed)
History   This chart was scribed for Marilyn Lennert, MD by Clarita Crane. The patient was seen in room APA07/APA07 and the patient's care was started at 11:09AM.   CSN: [id number]  Arrival date & time 06/03/11  1041   First MD Initiated Contact with Patient 06/03/11 1105      Chief Complaint  Patient presents with  . Chest Pain    (Consider location/radiation/quality/duration/timing/severity/associated sxs/prior treatment) Patient is a 78 y.o. female presenting with chest pain. The history is provided by the patient.  Chest Pain The chest pain began 1 - 2 hours ago. Duration of episode(s) is 3 minutes. The chest pain is resolved. The severity of the pain is moderate. The quality of the pain is described as squeezing and tightness. The pain radiates to the left shoulder. Primary symptoms include shortness of breath and nausea. Pertinent negatives for primary symptoms include no fatigue, no cough, no palpitations, no abdominal pain and no vomiting.  Pertinent negatives for associated symptoms include no lower extremity edema and no numbness.  Her past medical history is significant for hypertension and thyroid problem.  Pertinent negatives for past medical history include no seizures.  Procedure history is positive for cardiac catheterization (several years ago).    PCP- Margo Aye Cardiologist-Rothbart  Past Medical History  Diagnosis Date  . Hypertension   . Thyroid disease     Past Surgical History  Procedure Date  . Thyroidectomy   . Tonsillectomy     No family history on file.  History  Substance Use Topics  . Smoking status: Never Smoker   . Smokeless tobacco: Not on file  . Alcohol Use: No  -Resident of [employer]  OB History    Grav Para Term Preterm Abortions TAB SAB Ect Mult Living                  Review of Systems  Constitutional: Negative for fatigue.  HENT: Negative for congestion, sinus pressure and ear discharge.   Eyes: Negative for discharge.    Respiratory: Positive for shortness of breath. Negative for cough.   Cardiovascular: Positive for chest pain. Negative for palpitations.  Gastrointestinal: Positive for nausea. Negative for vomiting, abdominal pain and diarrhea.  Genitourinary: Negative for frequency and hematuria.  Musculoskeletal: Negative for back pain.  Skin: Negative for rash.  Neurological: Negative for seizures, numbness and headaches.  Hematological: Negative.   Psychiatric/Behavioral: Negative for hallucinations.    Allergies  Sulfa antibiotics  Home Medications   Current Outpatient Rx  Name Route Sig Dispense Refill  . ACETAMINOPHEN 500 MG PO TABS Oral Take 500-1,000 mg by mouth as needed. For pain     . ALENDRONATE SODIUM 70 MG PO TABS Oral Take 70 mg by mouth every 7 (seven) days. Take with a full glass of water on an empty stomach on Mondays of each week    . ALPRAZOLAM 0.25 MG PO TABS Oral Take 0.25 mg by mouth daily as needed. For anxiety     . ASPIRIN EC 81 MG PO TBEC Oral Take 81 mg by mouth daily.      . CHOLECALCIFEROL 400 UNITS PO TABS Oral Take 400 Units by mouth daily.      . CYCLOSPORINE 0.05 % OP EMUL Both Eyes Place 1 drop into both eyes 2 (two) times daily.      Marland Kitchen FLUTICASONE PROPIONATE 50 MCG/ACT NA SUSP Nasal Place 1 spray into the nose daily.      Marland Kitchen HYDRALAZINE HCL 25 MG PO  TABS Oral Take 25 mg by mouth 2 (two) times daily.      Marland Kitchen LEVOCETIRIZINE DIHYDROCHLORIDE 5 MG PO TABS Oral Take 5 mg by mouth at bedtime.      Marland Kitchen LEVOTHYROXINE SODIUM 75 MCG PO TABS Oral Take 75 mcg by mouth daily.      Marland Kitchen LISINOPRIL 20 MG PO TABS Oral Take 20 mg by mouth 2 (two) times daily.      . LUBIPROSTONE 24 MCG PO CAPS Oral Take 24 mcg by mouth 2 (two) times daily as needed. For stomach/bowel     . MAGNESIUM HYDROXIDE 400 MG/5ML PO SUSP Oral Take 5 mLs by mouth daily as needed. For stomach relief     . METRONIDAZOLE 500 MG PO TABS  1 tablet by mouth 3 times per day for 10 days 30 tablet 0  . ONE-A-DAY WOMENS  FORMULA PO Oral Take 1 tablet by mouth daily.      Marland Kitchen FISH OIL 1000 MG PO CAPS Oral Take 1 capsule by mouth 2 (two) times daily.      Marland Kitchen ONDANSETRON HCL 4 MG PO TABS  Take 1 tablet by mouth every 6 hours as needed for nausea 12 tablet 0  . POLYETHYLENE GLYCOL 3350 PO POWD Oral Take 17 g by mouth daily.      . PSYLLIUM 95 % PO PACK Oral Take 1 packet by mouth daily.      Bernadette Hoit SODIUM 8.6-50 MG PO TABS Oral Take 2-3 tablets by mouth daily.        BP 117/44  Pulse 46  Resp 19  Ht 5\' 5"  (1.651 m)  Wt 147 lb (66.679 kg)  BMI 24.46 kg/m2  SpO2 97%  Physical Exam  Nursing note and vitals reviewed. Constitutional: She is oriented to person, place, and time. She appears well-developed and well-nourished. No distress.  HENT:  Head: Normocephalic and atraumatic.  Mouth/Throat: Oropharynx is clear and moist.  Eyes: EOM are normal. Pupils are equal, round, and reactive to light.  Neck: Neck supple. No tracheal deviation present.  Cardiovascular: Regular rhythm.  Bradycardia present.   Pulmonary/Chest: Effort normal. No respiratory distress. She has no wheezes.  Abdominal: Soft. She exhibits no distension. There is no tenderness.  Musculoskeletal: Normal range of motion. She exhibits no edema.  Neurological: She is alert and oriented to person, place, and time. No sensory deficit.  Skin: Skin is warm and dry.  Psychiatric: She has a normal mood and affect. Her behavior is normal.    ED Course  Procedures (including critical care time)  DIAGNOSTIC STUDIES: Oxygen Saturation is 100% on room air, normal by my interpretation.    COORDINATION OF CARE: 11:13AM- Patient informed of current plan for treatment and evaluation and agrees with plan at this time.  2:27PM- Patient notes that pain has improved and not returned at this time. Patient recommended admission to hospital for further observation. Patient agrees with plan.   Results for orders placed during the hospital  encounter of 06/03/11  CBC      Component Value Range   WBC 5.2  4.0 - 10.5 (K/uL)   RBC 3.51 (*) 3.87 - 5.11 (MIL/uL)   Hemoglobin 11.7 (*) 12.0 - 15.0 (g/dL)   HCT 96.0 (*) 45.4 - 46.0 (%)   MCV 98.3  78.0 - 100.0 (fL)   MCH 33.3  26.0 - 34.0 (pg)   MCHC 33.9  30.0 - 36.0 (g/dL)   RDW 09.8  11.9 - 14.7 (%)   Platelets  301  150 - 400 (K/uL)  DIFFERENTIAL      Component Value Range   Neutrophils Relative 43  43 - 77 (%)   Neutro Abs 2.2  1.7 - 7.7 (K/uL)   Lymphocytes Relative 41  12 - 46 (%)   Lymphs Abs 2.1  0.7 - 4.0 (K/uL)   Monocytes Relative 11  3 - 12 (%)   Monocytes Absolute 0.6  0.1 - 1.0 (K/uL)   Eosinophils Relative 4  0 - 5 (%)   Eosinophils Absolute 0.2  0.0 - 0.7 (K/uL)   Basophils Relative 1  0 - 1 (%)   Basophils Absolute 0.1  0.0 - 0.1 (K/uL)  BASIC METABOLIC PANEL      Component Value Range   Sodium 136  135 - 145 (mEq/L)   Potassium 4.1  3.5 - 5.1 (mEq/L)   Chloride 99  96 - 112 (mEq/L)   CO2 28  19 - 32 (mEq/L)   Glucose, Bld 96  70 - 99 (mg/dL)   BUN 21  6 - 23 (mg/dL)   Creatinine, Ser 1.61 (*) 0.50 - 1.10 (mg/dL)   Calcium 9.6  8.4 - 09.6 (mg/dL)   GFR calc non Af Amer 38 (*) >90 (mL/min)   GFR calc Af Amer 44 (*) >90 (mL/min)  TROPONIN I      Component Value Range   Troponin I <0.30  <0.30 (ng/mL)  HEPATIC FUNCTION PANEL      Component Value Range   Total Protein 6.8  6.0 - 8.3 (g/dL)   Albumin 3.8  3.5 - 5.2 (g/dL)   AST 24  0 - 37 (U/L)   ALT 15  0 - 35 (U/L)   Alkaline Phosphatase 67  39 - 117 (U/L)   Total Bilirubin 0.4  0.3 - 1.2 (mg/dL)   Bilirubin, Direct <0.4  0.0 - 0.3 (mg/dL)   Indirect Bilirubin NOT CALCULATED  0.3 - 0.9 (mg/dL)  LIPASE, BLOOD      Component Value Range   Lipase 55  11 - 59 (U/L)    Dg Chest Portable 1 View  06/03/2011  *RADIOLOGY REPORT*  Clinical Data: Chest pain  PORTABLE CHEST - 1 VIEW  Comparison: 04/28/2010  Findings: Chronic interstitial markings/emphysematous changes. No pleural effusion or  pneumothorax.  The heart is top normal in size.  IMPRESSION: No evidence of acute cardiopulmonary disease.  Chronic interstitial markings/emphysematous changes.  Original Report Authenticated By: Charline Bills, M.D.     No diagnosis found.   Date: 06/03/2011  Rate: 54  Rhythm: normal sinus rhythm  QRS Axis: normal  Intervals: normal  ST/T Wave abnormalities: nonspecific ST changes  Conduction Disutrbances:none  Narrative Interpretation:   Old EKG Reviewed: none available    MDM  Chest pain      The chart was scribed for me under my direct supervision.  I personally performed the history, physical, and medical decision making and all procedures in the evaluation of this patient.Marilyn Lennert, MD 06/03/11 1452

## 2011-06-04 ENCOUNTER — Encounter (HOSPITAL_COMMUNITY): Payer: Self-pay | Admitting: Adult Health

## 2011-06-04 ENCOUNTER — Observation Stay (HOSPITAL_COMMUNITY): Payer: Medicare Other

## 2011-06-04 DIAGNOSIS — Z7982 Long term (current) use of aspirin: Secondary | ICD-10-CM | POA: Diagnosis not present

## 2011-06-04 DIAGNOSIS — Z79899 Other long term (current) drug therapy: Secondary | ICD-10-CM | POA: Diagnosis not present

## 2011-06-04 DIAGNOSIS — I517 Cardiomegaly: Secondary | ICD-10-CM

## 2011-06-04 DIAGNOSIS — M1990 Unspecified osteoarthritis, unspecified site: Secondary | ICD-10-CM | POA: Diagnosis present

## 2011-06-04 DIAGNOSIS — E039 Hypothyroidism, unspecified: Secondary | ICD-10-CM | POA: Diagnosis present

## 2011-06-04 DIAGNOSIS — I498 Other specified cardiac arrhythmias: Principal | ICD-10-CM

## 2011-06-04 DIAGNOSIS — R918 Other nonspecific abnormal finding of lung field: Secondary | ICD-10-CM | POA: Diagnosis not present

## 2011-06-04 DIAGNOSIS — R1013 Epigastric pain: Secondary | ICD-10-CM | POA: Diagnosis not present

## 2011-06-04 DIAGNOSIS — I10 Essential (primary) hypertension: Secondary | ICD-10-CM | POA: Diagnosis not present

## 2011-06-04 DIAGNOSIS — I495 Sick sinus syndrome: Secondary | ICD-10-CM | POA: Diagnosis not present

## 2011-06-04 DIAGNOSIS — N289 Disorder of kidney and ureter, unspecified: Secondary | ICD-10-CM | POA: Diagnosis present

## 2011-06-04 DIAGNOSIS — N179 Acute kidney failure, unspecified: Secondary | ICD-10-CM | POA: Diagnosis not present

## 2011-06-04 DIAGNOSIS — R0789 Other chest pain: Secondary | ICD-10-CM | POA: Diagnosis not present

## 2011-06-04 DIAGNOSIS — D649 Anemia, unspecified: Secondary | ICD-10-CM | POA: Diagnosis not present

## 2011-06-04 DIAGNOSIS — H3530 Unspecified macular degeneration: Secondary | ICD-10-CM | POA: Diagnosis not present

## 2011-06-04 DIAGNOSIS — K5900 Constipation, unspecified: Secondary | ICD-10-CM | POA: Diagnosis not present

## 2011-06-04 DIAGNOSIS — E785 Hyperlipidemia, unspecified: Secondary | ICD-10-CM | POA: Diagnosis present

## 2011-06-04 DIAGNOSIS — Z950 Presence of cardiac pacemaker: Secondary | ICD-10-CM | POA: Diagnosis not present

## 2011-06-04 DIAGNOSIS — M810 Age-related osteoporosis without current pathological fracture: Secondary | ICD-10-CM | POA: Diagnosis present

## 2011-06-04 DIAGNOSIS — R079 Chest pain, unspecified: Secondary | ICD-10-CM | POA: Diagnosis not present

## 2011-06-04 LAB — LIPID PANEL
Cholesterol: 197 mg/dL (ref 0–200)
HDL: 61 mg/dL (ref >39)
Total CHOL/HDL Ratio: 3.2 RATIO
Triglycerides: 103 mg/dL (ref <150)
VLDL: 21 mg/dL (ref 0–40)

## 2011-06-04 LAB — BASIC METABOLIC PANEL
CO2: 28 mEq/L (ref 19–32)
Chloride: 103 mEq/L (ref 96–112)
Potassium: 4.3 mEq/L (ref 3.5–5.1)
Sodium: 137 mEq/L (ref 135–145)

## 2011-06-04 LAB — CBC
Platelets: 236 10*3/uL (ref 150–400)
RBC: 2.89 MIL/uL — ABNORMAL LOW (ref 3.87–5.11)
WBC: 3.5 10*3/uL — ABNORMAL LOW (ref 4.0–10.5)

## 2011-06-04 LAB — CARDIAC PANEL(CRET KIN+CKTOT+MB+TROPI): Troponin I: <0.3 ng/mL (ref <0.30)

## 2011-06-04 MED ORDER — LEVOTHYROXINE SODIUM 100 MCG PO TABS
100.0000 ug | ORAL_TABLET | Freq: Every day | ORAL | Status: DC
  Administered 2011-06-05 – 2011-06-07 (×3): 100 ug via ORAL
  Filled 2011-06-04 (×4): qty 1

## 2011-06-04 MED ORDER — POLYETHYLENE GLYCOL 3350 17 G PO PACK
17.0000 g | PACK | Freq: Every day | ORAL | Status: DC
  Administered 2011-06-04 – 2011-06-06 (×3): 17 g via ORAL
  Filled 2011-06-04 (×4): qty 1

## 2011-06-04 MED ORDER — ENOXAPARIN SODIUM 40 MG/0.4ML ~~LOC~~ SOLN
40.0000 mg | Freq: Every day | SUBCUTANEOUS | Status: DC
  Administered 2011-06-04 – 2011-06-06 (×3): 40 mg via SUBCUTANEOUS
  Filled 2011-06-04 (×3): qty 0.4

## 2011-06-04 NOTE — Progress Notes (Addendum)
Subjective: No further chest pain. No nausea vomiting diarrhea. No abdominal pain currently.  Objective: Vital signs in last 24 hours: Filed Vitals:   06/03/11 1429 06/03/11 1617 06/03/11 2201 06/04/11 0524  BP: 162/58 155/60 133/57 142/57  Pulse:  44 57 51  Temp:  97.3 F (36.3 C) 97.7 F (36.5 C) 97.4 F (36.3 C)  TempSrc:  Axillary Oral Oral  Resp: 18 18 18 18   Height:  5\' 5"  (1.651 m)    Weight:  70.2 kg (154 lb 12.2 oz)    SpO2:  96% 95% 97%   Weight change:   Intake/Output Summary (Last 24 hours) at 06/04/11 1226 Last data filed at 06/04/11 0835  Gross per 24 hour  Intake 1494.83 ml  Output      0 ml  Net 1494.83 ml   Physical Exam: General: Comfortable Lungs clear to auscultation bilaterally without wheezes rhonchi or rales Cardiovascular slow rate, regular rhythm without murmurs gallops rubs Abdomen soft nontender nondistended normal bowel sounds Extremities no clubbing cyanosis or edema  Lab Results: Basic Metabolic Panel:  Lab 06/04/11 1610 06/03/11 1608 06/03/11 1056  NA 137 -- 136  K 4.3 -- 4.1  CL 103 -- 99  CO2 28 -- 28  GLUCOSE 85 -- 96  BUN 17 -- 21  CREATININE 1.10 1.17* --  CALCIUM 8.8 -- 9.6  MG -- 2.4 --  PHOS -- 3.3 --   Liver Function Tests:  Lab 06/03/11 1110  AST 24  ALT 15  ALKPHOS 67  BILITOT 0.4  PROT 6.8  ALBUMIN 3.8    Lab 06/03/11 1110  LIPASE 55  AMYLASE --   No results found for this basename: AMMONIA:2 in the last 168 hours CBC:  Lab 06/04/11 0507 06/03/11 1608 06/03/11 1056  WBC 3.5* 4.4 --  NEUTROABS -- -- 2.2  HGB 9.7* 10.4* --  HCT 28.1* 30.7* --  MCV 97.2 97.2 --  PLT 236 268 --   Cardiac Enzymes:  Lab 06/04/11 0510 06/03/11 2202 06/03/11 1608  CKTOTAL 96 107 115  CKMB 1.8 1.9 2.1  CKMBINDEX -- -- --  TROPONINI <0.30 <0.30 <0.30   BNP: No results found for this basename: PROBNP:3 in the last 168 hours D-Dimer: No results found for this basename: DDIMER:2 in the last 168 hours CBG: No results  found for this basename: GLUCAP:6 in the last 168 hours Hemoglobin A1C: No results found for this basename: HGBA1C in the last 168 hours Fasting Lipid Panel:  Lab 06/04/11 0507  CHOL 197  HDL 61  LDLCALC 115*  TRIG 103  CHOLHDL 3.2  LDLDIRECT --   Thyroid Function Tests:  Lab 06/03/11 1608  TSH 11.896*  T4TOTAL --  FREET4 0.98  T3FREE --  THYROIDAB --   Coagulation: No results found for this basename: LABPROT:4,INR:4 in the last 168 hours Anemia Panel: No results found for this basename: VITAMINB12,FOLATE,FERRITIN,TIBC,IRON,RETICCTPCT in the last 168 hours Urine Drug Screen: Drugs of Abuse  No results found for this basename: labopia, cocainscrnur, labbenz, amphetmu, thcu, labbarb    Alcohol Level: No results found for this basename: ETH:2 in the last 168 hours Urinalysis: No results found for this basename: COLORURINE:2,APPERANCEUR:2,LABSPEC:2,PHURINE:2,GLUCOSEU:2,HGBUR:2,BILIRUBINUR:2,KETONESUR:2,PROTEINUR:2,UROBILINOGEN:2,NITRITE:2,LEUKOCYTESUR:2 in the last 168 hours   Micro Results: No results found for this or any previous visit (from the past 240 hour(s)). Studies/Results: Dg Chest Portable 1 View  06/03/2011  *RADIOLOGY REPORT*  Clinical Data: Chest pain  PORTABLE CHEST - 1 VIEW  Comparison: 04/28/2010  Findings: Chronic interstitial markings/emphysematous changes. No pleural  effusion or pneumothorax.  The heart is top normal in size.  IMPRESSION: No evidence of acute cardiopulmonary disease.  Chronic interstitial markings/emphysematous changes.  Original Report Authenticated By: Charline Bills, M.D.   Scheduled Meds:   . alum & mag hydroxide-simeth  15 mL Oral Once  . amLODipine  2.5 mg Oral Daily  . aspirin EC  81 mg Oral Daily  . beta carotene w/minerals  1 tablet Oral BID  . cholecalciferol  400 Units Oral Daily  . cycloSPORINE  1 drop Both Eyes BID  . enoxaparin  30 mg Subcutaneous Q24H  . hydrALAZINE  25 mg Oral BID  . isosorbide mononitrate  15 mg  Oral Daily  . levothyroxine  100 mcg Oral Daily  . lisinopril  20 mg Oral BID  . loratadine  10 mg Oral QHS  . magnesium oxide  400 mg Oral Daily  . mulitivitamin with minerals  1 tablet Oral q morning - 10a  . omega-3 acid ethyl esters  1 g Oral Daily  . pantoprazole  40 mg Oral Q1200  . polyethylene glycol  17 g Oral QHS  . psyllium  1 packet Oral QHS  . senna  1 tablet Oral BID  . DISCONTD: docusate sodium  100 mg Oral QHS  . DISCONTD: levothyroxine  75 mcg Oral Daily  . DISCONTD: Magnesium  1 tablet Oral Daily  . DISCONTD: polyethylene glycol powder  17 g Oral Daily  . DISCONTD: polyethylene glycol powder  17 g Oral QHS  . DISCONTD: psyllium  1 packet Oral Daily   Continuous Infusions:   . 0.9 % NaCl with KCl 20 mEq / King 70 mL/hr at 06/04/11 0638   PRN Meds:.acetaminophen, albuterol, ALPRAZolam, ALPRAZolam, alum & mag hydroxide-simeth, fluticasone, guaiFENesin-dextromethorphan, lubiprostone, magnesium hydroxide, morphine, ondansetron (ZOFRAN) IV, ondansetron, traMADol, triamcinolone cream Assessment/Plan: Active Problems:  Hypothyroidism  ANEMIA  Chest tightness  Bradycardia  Hypertension  Acute renal insufficiency  Epigastric abdominal tenderness  Patient's TSH is 11. I will increase her Synthroid to 100 mcg a day. Certainly this could contribute to her acute worsening of chronic bradycardia. She has had periods into the high 20s on telemetry. She has ruled out for MI. Ultrasound of the abdomen is pending, but she has a benign abdominal exam. Appreciate cardiology. Echocardiogram is pending.   LOS: 1 day   [name] 06/04/2011, 12:26 PM

## 2011-06-04 NOTE — Progress Notes (Signed)
*  PRELIMINARY RESULTS* Echocardiogram 2D Echocardiogram has been performed.  [name] 06/04/2011, 12:03 PM

## 2011-06-04 NOTE — Progress Notes (Signed)
CARE MANAGEMENT NOTE 06/04/2011  Patient:  [name], [name]   Account Number:  [id number]  Date Initiated:  06/04/2011  Documentation initiated by:  Rosemary Holms  Subjective/Objective Assessment:   [facility] admitted [facility]. Independent w/ ADL.     Action/Plan:   Plan to observe and dc back to [facility] without HH needs.   Anticipated DC Date:  06/05/2011   Anticipated DC Plan:  ASSISTED LIVING / REST HOME      DC Planning Services  CM consult      Choice offered to / List presented to:             Status of service:  In process, will continue to follow Medicare Important Message given?   (If response is "NO", the following Medicare IM given date fields will be blank) Date Medicare IM given:   Date Additional Medicare IM given:    Discharge Disposition:    Per UR Regulation:    Comments:  06/04/11 1400 Samuella Rasool Leanord Hawking RN BSN CM

## 2011-06-04 NOTE — Progress Notes (Signed)
Pt admitted from [facility].  Plan is for her to return at d/c.  Full note in shadow chart.  Pt is independent in care at facility.  CSW to continue to follow.  Karn Cassis

## 2011-06-04 NOTE — Consult Note (Signed)
CARDIOLOGY CONSULT NOTE  Patient ID: [name] MRN: [id number] DOB/AGE: February 22, 1923 78 y.o.  Admit date: 06/03/2011 Referring Physician: PTH Primary Dierdre Forth, MD, MD Primary Cardiologist: Molly Maduro Reason for Consultation: Chest Pain and Bradycardia Active Problems:  ANEMIA  Chest tightness  Bradycardia  Hypertension  Acute renal insufficiency  Epigastric abdominal tenderness  HPI: [name] is an 78 year old patient of Dr. [place] Bing with known history of hypertension, palpitations, bradycardia, with history of anxiety and hypothyroidism. She presented to any pain emergency room after having a profound feeling of weakness and fatigue while sitting in church. She also experienced anterior chest pain associated with mild nausea but no diaphoresis or dyspnea which lasted approximately 5 minutes and resolve spontaneously.  Bradycardia was noted in the ED with heart rate in the 40s. Moderate anemia was present. Potassium level was normal.  TSH was modestly elevated at 11.896. Free T4 was 0.98.  She states that she has been having episodes of "waves" of lightheadedness and dizziness that go away as quickly as they come. These are not new, but maybe more frequent in recent weeks She has never had any syncope, but she feels presyncopal at times. She states that she has chronic palpitations.  She has not had any recurrent chest pain since admission. Review of telemetry reveals sustained heart rates in the 40s with transient episodes of more severe bradycardia and pauses up to 3 seconds.  Review of systems complete and found to be negative unless listed above   Past Medical History  Diagnosis Date  . Hypertension   . Hypothyroidism   . Bradycardia   . Anemia   . Diverticulosis   . External hemorrhoids   . Constipation   . Macular degeneration   . Seasonal allergies   . Osteoporosis   . Degenerative joint disease   . Palpitations   . Hyperlipidemia     Family History    Problem Relation Age of Onset  . Heart attack Mother     Deceased  . Cancer Father     Deceased  . Cancer Brother     Deceased    History   Social History  . Marital Status: Widowed    Spouse Name: N/A    Number of Children: N/A  . Years of Education: N/A   Occupational History  . Not on file.   Social History Main Topics  . Smoking status: Never Smoker   . Smokeless tobacco: Not on file  . Alcohol Use: No  . Drug Use: No  . Sexually Active:    Other Topics Concern  . Not on file   Social History Narrative  . No narrative on file    Past Surgical History  Procedure Date  . Thyroidectomy   . Tonsillectomy   . Colonoscopy November 2011    External hemorrhoids, diverticulosis, anal papillae     Prescriptions prior to admission  Medication Sig Dispense Refill  . acetaminophen (TYLENOL) 650 MG CR tablet Take 650 mg by mouth every 8 (eight) hours as needed. For pain      . alendronate (FOSAMAX) 70 MG tablet Take 70 mg by mouth every 7 (seven) days. Take with a full glass of water on an empty stomach on Fridays of each week      . amLODipine (NORVASC) 2.5 MG tablet Take 2.5 mg by mouth daily.      Marland Kitchen aspirin EC 81 MG tablet Take 81 mg by mouth daily.        Marland Kitchen  beta carotene w/minerals (OCUVITE) tablet Take 1 tablet by mouth 2 (two) times daily.      . cetirizine (ZYRTEC) 10 MG tablet Take 10 mg by mouth daily.      Marland Kitchen docusate sodium (COLACE) 100 MG capsule Take 100 mg by mouth at bedtime.      Marland Kitchen guaiFENesin-dextromethorphan (ROBITUSSIN DM) 100-10 MG/5ML syrup Take 5 mLs by mouth every 4 (four) hours as needed. For cough      . hydrALAZINE (APRESOLINE) 25 MG tablet Take 25 mg by mouth 2 (two) times daily.        Marland Kitchen lisinopril (PRINIVIL,ZESTRIL) 20 MG tablet Take 20 mg by mouth 2 (two) times daily.        . Magnesium 250 MG TABS Take 1 tablet by mouth daily.      . Multiple Vitamins-Calcium (ONE-A-DAY WOMENS FORMULA PO) Take 1 tablet by mouth daily.        . Omega-3 Fatty  Acids (FISH OIL) 1000 MG CAPS Take 1 capsule by mouth 2 (two) times daily.        . polyethylene glycol powder (GLYCOLAX/MIRALAX) powder Take 17 g by mouth daily.        . traMADol (ULTRAM) 50 MG tablet Take 50 mg by mouth 2 (two) times daily as needed. For pain      . triamcinolone cream (KENALOG) 0.5 % Apply 1 application topically 2 (two) times daily as needed. For rash      . ALPRAZolam (XANAX) 0.25 MG tablet Take 0.25 mg by mouth every 8 (eight) hours as needed. For anxiety      . cycloSPORINE (RESTASIS) 0.05 % ophthalmic emulsion Place 1 drop into both eyes 2 (two) times daily.        . fluticasone (FLONASE) 50 MCG/ACT nasal spray Place 1 spray into the nose daily as needed. For allergies      . lubiprostone (AMITIZA) 24 MCG capsule Take 24 mcg by mouth 2 (two) times daily as needed. For stomach/bowel       . magnesium hydroxide (MILK OF MAGNESIA) 400 MG/5ML suspension Take 30 mLs by mouth daily as needed. For stomach relief        Physical Exam: Blood pressure 142/57, pulse 51, temperature 97.4 F (36.3 C), temperature source Oral, resp. rate 18, height 5\' 5"  (1.651 m), weight 154 lb 12.2 oz (70.2 kg), SpO2 97.00%.  General: Well developed, well nourished, in no acute distress, fraile Head: Eyes PERRLA, No xanthomas.   Normal cephalic and atramatic  Lungs: Clear bilaterally to auscultation and percussion. Heart: HRRR S1 S2, bradycardic. S4 present.  Pulses are 2+ & equal.            No carotid bruit. No JVD.  No abdominal bruits. No femoral bruits. Abdomen: Bowel sounds are positive, abdomen soft and non-tender without masses or                  Hernia's noted. Msk:  Back normal, normal gait. Normal strength and tone for age. Extremities: No clubbing, cyanosis or edema.  DP +1 Neuro: Alert and oriented X 3. Psych:  Good affect, responds appropriately   Lab Results  Component Value Date   WBC 3.5* 06/04/2011   HGB 9.7* 06/04/2011   HCT 28.1* 06/04/2011   MCV 97.2 06/04/2011   PLT  236 06/04/2011     Lab 06/04/11 0507 06/03/11 1110  NA 137 --  K 4.3 --  CL 103 --  CO2 28 --  BUN 17 --  CREATININE 1.10 --  CALCIUM 8.8 --  PROT -- 6.8  BILITOT -- 0.4  ALKPHOS -- 67  ALT -- 15  AST -- 24  GLUCOSE 85 --   Lab Results  Component Value Date   CKTOTAL 96 06/04/2011   CKMB 1.8 06/04/2011   TROPONINI <0.30 06/04/2011    Lab Results  Component Value Date   CHOL 197 06/04/2011   Lab Results  Component Value Date   HDL 61 06/04/2011   Lab Results  Component Value Date   LDLCALC 115* 06/04/2011   Lab Results  Component Value Date   TRIG 103 06/04/2011   Lab Results  Component Value Date   CHOLHDL 3.2 06/04/2011   No results found for this basename: LDLDIRECT   Stress Myoview 08/2006 Negative stress Myoview study revealing impaired exercise capacity,   normal left ventricular size, and normal left ventricular systolic   function.  By scintigraphic imaging, there was mild breast   attenuation artifact without convincing evidence for ischemia or   infarction.    Holter 12/2009   Mild PAC's. and PVC but no sustained arrythimias.  HR 39-126 bpm  Echo 12/2009.   The results revealed normal systolic function of the left ventricle. Ejection fraction was 55-60%.  Wall motion was normal.  There were no wall motion abnormalities.  Prominent epicardial fat pad.  Colonoscopy:03/2010 Multiple diverticula at sigmoid and descending colon with one at hepatic flexure. Melanosis coli.Marland Kitchen External hemorrhoids with anal papillae.. No evidence of arteriovenous malformations or polyps.   Radiology: Dg Chest Portable 1 View  06/03/2011  *RADIOLOGY REPORT*  Clinical Data: Chest pain  PORTABLE CHEST - 1 VIEW  Comparison: 04/28/2010  Findings: Chronic interstitial markings/emphysematous changes. No pleural effusion or pneumothorax.  The heart is top normal in size.  IMPRESSION: No evidence of acute cardiopulmonary disease.  Chronic interstitial markings/emphysematous changes.   Original Report Authenticated By: Charline Bills, M.D.   ZOX:WRUEA bradycardia rate of 56 bpm. No evidence of ischemia.  ASSESSMENT AND PLAN:   1. Chest pain: She has had no recurrent chest discomfort since admission. Troponins have been negative x3. EKG is normal with the exception of being bradycardic.  We will continue to monitor on telemetry at this time. Holter monitor in September 2011 - heart rate range of 39 to 126bpm with PACs and PVCs. Most recent stress Myoview was completed in 2008 and was negative for ischemia. Review of records in 2008 per Dr. Diona Browner revealed that she had a normal cardiac catheterization in 2002, which patient verifies.  She is not on any medications likely to contribute to bradycardia.  2. Hypothyroidism: It is noted that her TSH is elevated. She is on Synthroid 75 mcg at home , which is continued as an inpatient. May need to increase this dose.  3. Hypertension: Currently in control to moderately elevated since admission. She should continue hydralazine, norvasc and lisinopril as directed. Creatinine 1.25, prior creatinine 1.26 and 1.09 in past lab values.  4. Anemia: Hemoglobin-9.7. Review of past hemoglobin and hematocrit shows that in September of 2012 her hemoglobin did get as low as 8.9. Most recent labs since September 2012 are during this hospitalization. Anemia profile that was completed in September of 2001 and revealed normal iron, TIBC, and saturation. The patient's TIBC was low at 239. She has a history of heme positive stools in the remote past during a hospitalization at that time. Most recent colonoscopy was completed in December of 2011. There was no evidence of active bleeding  at that time.    Bettey Mare. Lyman Bishop NP [facility] 06/04/2011, 9:38 AM   Cardiology Attending Patient interviewed and examined. Discussed with Joni Reining, NP.  Above note annotated and modified based upon my findings.  Substantial bradycardia with a  history of sick sinus syndrome and with episodes of presyncope, which may well be related to decreased heart rate.  Chest discomfort is much less likely to be attributable to bradycardia, but she does have periods of very frequent PACs and a history of atrial fibrillation. Chest discomfort may have occurred during a recurrence of AF. In the absence of recurrent symptoms or further evidence for myocardial ischemia, I would not pursue additional testing for evaluation of chest discomfort. The case will be discussed with our electrophysiologists in the morning, and she will be transferred if they elect to implant a pacemaker. In the interim, she should only be up with assistance.  While she does have borderline hypothyroidism, the likelihood that a slight increase in thyroid hormone most self her arrhythmia is miniscule.  Anemia is significant and repeat evaluation is warranted. Antihypertensive medication can be adjusted as necessary of without introducing any medications that would tend to negatively affect the cardiac conduction system.  [place] Bing, MD 06/04/2011, 7:31 PM

## 2011-06-04 NOTE — Progress Notes (Signed)
Pt ambulated in hallway approximately 100 ft with assistance from NT. Tolerated well.

## 2011-06-04 NOTE — Progress Notes (Addendum)
Pt ambulated in hallway with assistance from NT. Pt ambulated approximately 500 ft. Tolerated well.

## 2011-06-05 NOTE — Progress Notes (Addendum)
SUBJECTIVE:No complaints  Active Problems:  ANEMIA  Chest tightness  Bradycardia  Hypertension  Acute renal insufficiency  Epigastric abdominal tenderness  Hypothyroidism   LABS: Basic Metabolic Panel:  Basename 06/04/11 0507 06/03/11 1608 06/03/11 1056  NA 137 -- 136  K 4.3 -- 4.1  CL 103 -- 99  CO2 28 -- 28  GLUCOSE 85 -- 96  BUN 17 -- 21  CREATININE 1.10 1.17* --  CALCIUM 8.8 -- 9.6  MG -- 2.4 --  PHOS -- 3.3 --   Liver Function Tests:  Basename 06/03/11 1110  AST 24  ALT 15  ALKPHOS 67  BILITOT 0.4  PROT 6.8  ALBUMIN 3.8    Basename 06/03/11 1110  LIPASE 55  AMYLASE --   CBC:  Basename 06/04/11 0507 06/03/11 1608 06/03/11 1056  WBC 3.5* 4.4 --  NEUTROABS -- -- 2.2  HGB 9.7* 10.4* --  HCT 28.1* 30.7* --  MCV 97.2 97.2 --  PLT 236 268 --   Cardiac Enzymes:  Basename 06/04/11 0510 06/03/11 2202 06/03/11 1608  CKTOTAL 96 107 115  CKMB 1.8 1.9 2.1  CKMBINDEX -- -- --  TROPONINI <0.30 <0.30 <0.30   Fasting Lipid Panel:  Basename 06/04/11 0507  CHOL 197  HDL 61  LDLCALC 115*  TRIG 103  CHOLHDL 3.2  LDLDIRECT --   Thyroid Function Tests:  Basename 06/03/11 1608  TSH 11.896*  T4TOTAL --  T3FREE --  THYROIDAB --    RADIOLOGY: US Abdomen Complete  06/04/2011  *RADIOLOGY REPORT*  Clinical Data:  Epigastric pain.  COMPLETE ABDOMINAL ULTRASOUND  Comparison:  CT 01/18/2011  Findings:  Gallbladder:  No gallstones, gallbladder wall thickening, or pericholecystic fluid.  Common bile duct:   Normal caliber, 5 mm.  Liver:  No focal lesion identified.  Within normal limits in parenchymal echogenicity.  IVC:  Appears normal.  Pancreas:  Not well visualized due to overlying bowel gas.  Spleen:  Within normal limits in size and echotexture.  Right Kidney:   Normal in size and parenchymal echogenicity.  No evidence of mass or hydronephrosis.  Left Kidney:  Normal in size and parenchymal echogenicity.  No evidence of mass or hydronephrosis.  Abdominal aorta:   No aneurysm identified.  IMPRESSION: No acute findings.  Original Report Authenticated By: Cyndie Chime, M.D.   Dg Chest Portable 1 View  06/03/2011  *RADIOLOGY REPORT*  Clinical Data: Chest pain  PORTABLE CHEST - 1 VIEW  Comparison: 04/28/2010  Findings: Chronic interstitial markings/emphysematous changes. No pleural effusion or pneumothorax.  The heart is top normal in size.  IMPRESSION: No evidence of acute cardiopulmonary disease.  Chronic interstitial markings/emphysematous changes.  Original Report Authenticated By: Charline Bills, M.D.     PHYSICAL EXAM BP 149/58  Pulse 50  Temp(Src) 97.6 F (36.4 C) (Oral)  Resp 20  Ht 5\' 5"  (1.651 m)  Wt 154 lb 12.2 oz (70.2 kg)  BMI 25.75 kg/m2  SpO2 93% General: Well developed, well nourished, in no acute distress Head: Eyes PERRLA, No xanthomas.   Normal cephalic and atramatic  Lungs: Clear bilaterally to auscultation and percussion. Heart: HRRR, bradycardic without MRG .  Pulses are 2+ & equal.            No carotid bruit. No JVD.  No abdominal bruits. No femoral bruits. Abdomen: Bowel sounds are positive, abdomen soft and non-tender without masses or                  Hernia's noted. Msk:  Back  normal, normal gait. Normal strength and tone for age. Extremities: No clubbing, cyanosis or edema.  DP +1 Neuro: Alert and oriented X 3. Psych:  Good affect, responds appropriately  TELEMETRY: Reviewed telemetry pt in sinus bradycardia, pauses up to 2.8 seconds.  ASSESSMENT AND PLAN:  1. Symptomatic sick sinus syndrome: Review of telemetry shows heart rates in the 20s to 40s with pauses of 2.86 seconds. Dr. Dietrich Pates is planning on speaking with EP specialist today about transferring patient to [facility] for pacemaker placement. I talked with the patient about this and she is willing to be transferred and had a pacemaker placed.   2. Hypertension: Blood pressure is moderately controlled. We will continue to monitor this post pacemaker placement  for adjustments in medications.  Bettey Mare. Lyman Bishop NP [facility] 06/05/2011, 9:52 AM   Cardiology Attending Patient interviewed and examined. Discussed with Joni Reining, NP.  Above note annotated and modified based upon my findings.  Discussed with Dr. Ladona Ridgel, who agrees that pacing is indicated.  Arrangements will be made to transfer to [facility]  [place] Bing, MD 06/05/2011, 5:56 PM

## 2011-06-05 NOTE — Progress Notes (Signed)
Made aware of plans for transfer to [facility] for EP and pacemaker placement. Will follow and communicate with [facility] CSW for further d/c planning form there- patient plans to return to [facility] at d/c. Reece Levy, MSW, Theresia Majors [phone]

## 2011-06-05 NOTE — Progress Notes (Signed)
CARE MANAGEMENT NOTE 06/05/2011  Patient:  [name], [name]   Account Number:  [id number]  Date Initiated:  06/04/2011  Documentation initiated by:  Rosemary Holms  Subjective/Objective Assessment:   [facility]. Independent w/ ADL.     Action/Plan:   Plan to observe and dc back to [facility] without HH needs.   Anticipated DC Date:  06/05/2011   Anticipated DC Plan:  ASSISTED LIVING / REST HOME      DC Planning Services  CM consult      Choice offered to / List presented to:             Status of service:  Completed, signed off Medicare Important Message given?   (If response is "NO", the following Medicare IM given date fields will be blank) Date Medicare IM given:   Date Additional Medicare IM given:    Discharge Disposition:  ACUTE TO ACUTE TRANS  Per UR Regulation:    Comments:  06/05/11 1200 Adron Geisel Leanord Hawking RN BSN CM Transfer to [facility] for Pacemaker.  06/04/11 1400 Shatia Sindoni Leanord Hawking RN BSN CM

## 2011-06-06 ENCOUNTER — Encounter (HOSPITAL_COMMUNITY): Payer: Self-pay | Admitting: Internal Medicine

## 2011-06-06 MED ORDER — CHLORHEXIDINE GLUCONATE 4 % EX LIQD
60.0000 mL | Freq: Once | CUTANEOUS | Status: AC
  Administered 2011-06-06: 4 via TOPICAL
  Filled 2011-06-06: qty 60

## 2011-06-06 MED ORDER — CEFAZOLIN SODIUM 1-5 GM-% IV SOLN
1.0000 g | INTRAVENOUS | Status: DC
  Filled 2011-06-06 (×2): qty 50

## 2011-06-06 MED ORDER — SODIUM CHLORIDE 0.9 % IR SOLN
80.0000 mg | Status: DC
  Filled 2011-06-06 (×2): qty 2

## 2011-06-06 MED ORDER — SODIUM CHLORIDE 0.9 % IV SOLN
INTRAVENOUS | Status: DC
  Administered 2011-06-07: 09:00:00 via INTRAVENOUS

## 2011-06-06 MED ORDER — CHLORHEXIDINE GLUCONATE 4 % EX LIQD
60.0000 mL | Freq: Once | CUTANEOUS | Status: AC
  Administered 2011-06-07: 4 via TOPICAL
  Filled 2011-06-06 (×2): qty 60

## 2011-06-06 NOTE — Progress Notes (Signed)
Patient ID: [name], female   DOB: 1922/11/22, 78 y.o.   MRN: [id number] SUBJECTIVE:No complaints Has been dizzy prior to admission  Active Problems:  ANEMIA  Chest tightness  Bradycardia  Hypertension  Acute renal insufficiency  Epigastric abdominal tenderness  Hypothyroidism   LABS: Basic Metabolic Panel:  Basename 06/04/11 0507 06/03/11 1608 06/03/11 1056  NA 137 -- 136  K 4.3 -- 4.1  CL 103 -- 99  CO2 28 -- 28  GLUCOSE 85 -- 96  BUN 17 -- 21  CREATININE 1.10 1.17* --  CALCIUM 8.8 -- 9.6  MG -- 2.4 --  PHOS -- 3.3 --   Liver Function Tests:  Basename 06/03/11 1110  AST 24  ALT 15  ALKPHOS 67  BILITOT 0.4  PROT 6.8  ALBUMIN 3.8    Basename 06/03/11 1110  LIPASE 55  AMYLASE --   CBC:  Basename 06/04/11 0507 06/03/11 1608 06/03/11 1056  WBC 3.5* 4.4 --  NEUTROABS -- -- 2.2  HGB 9.7* 10.4* --  HCT 28.1* 30.7* --  MCV 97.2 97.2 --  PLT 236 268 --   Cardiac Enzymes:  Basename 06/04/11 0510 06/03/11 2202 06/03/11 1608  CKTOTAL 96 107 115  CKMB 1.8 1.9 2.1  CKMBINDEX -- -- --  TROPONINI <0.30 <0.30 <0.30   Fasting Lipid Panel:  Basename 06/04/11 0507  CHOL 197  HDL 61  LDLCALC 115*  TRIG 103  CHOLHDL 3.2  LDLDIRECT --   Thyroid Function Tests:  Basename 06/03/11 1608  TSH 11.896*  T4TOTAL --  T3FREE --  THYROIDAB --    RADIOLOGY: US Abdomen Complete  06/04/2011  *RADIOLOGY REPORT*  Clinical Data:  Epigastric pain.  COMPLETE ABDOMINAL ULTRASOUND  Comparison:  CT 01/18/2011  Findings:  Gallbladder:  No gallstones, gallbladder wall thickening, or pericholecystic fluid.  Common bile duct:   Normal caliber, 5 mm.  Liver:  No focal lesion identified.  Within normal limits in parenchymal echogenicity.  IVC:  Appears normal.  Pancreas:  Not well visualized due to overlying bowel gas.  Spleen:  Within normal limits in size and echotexture.  Right Kidney:   Normal in size and parenchymal echogenicity.  No evidence of mass or hydronephrosis.  Left  Kidney:  Normal in size and parenchymal echogenicity.  No evidence of mass or hydronephrosis.  Abdominal aorta:  No aneurysm identified.  IMPRESSION: No acute findings.  Original Report Authenticated By: Cyndie Chime, M.D.     PHYSICAL EXAM BP 150/69  Pulse 42  Temp(Src) 97.7 F (36.5 C) (Oral)  Resp 19  Ht 5\' 5"  (1.651 m)  Wt 143 lb 11.8 oz (65.2 kg)  BMI 23.92 kg/m2  SpO2 95% General: Well developed, well nourished, in no acute distress Head: Eyes PERRLA, No xanthomas.   Normal cephalic and atramatic  Lungs: Clear bilaterally to auscultation and percussion. Heart: HRRR, bradycardic without MRG .  Pulses are 2+ & equal.            No carotid bruit. No JVD.  No abdominal bruits. No femoral bruits. Abdomen: Bowel sounds are positive, abdomen soft and non-tender without masses or                  Hernia's noted. Msk:  Back normal, normal gait. Normal strength and tone for age. Extremities: No clubbing, cyanosis or edema.  DP +1 Neuro: Alert and oriented X 3. Psych:  Good affect, responds appropriately  TELEMETRY: Reviewed telemetry pt in sinus bradycardia, pauses up to 2.8 seconds. SB  46 currently  ASSESSMENT AND PLAN:  1. Symptomatic sick sinus syndrome: Review of telemetry shows heart rates in the 20s to 40s with pauses of 2.86 seconds. Discussed with Dr Graciela Husbands.  Patient currently not on pacer schedule  He will figure out schedule  2. Hypertension: Blood pressure is moderately controlled. We will continue to monitor this post pacemaker placement for adjustments in medications.    Charlton Haws, MD 06/06/2011, 7:49 AM

## 2011-06-06 NOTE — Consult Note (Signed)
Reason for Consult:symptomatic bradycardia  Referring Physician: Annisha Baar is an 78 y.o. female.   HPI: Mrs. Mcadams is referred by Dr. Dietrich Pates for evaluation of symptomatic bradycardia. She has a h/o tachybrady syndrome and presented to [facility] with a spell of chest pressure, dizziness and sob. She was found to be bradycardic with heart rates in the 30's. She has a h/o atrial fibrillation. She has never had frank syncope but she does have recurrent dizzy spells lasting a few seconds at a time. She denis peripheral edema.   PMH: Past Medical History  Diagnosis Date  . Hypertension   . Hypothyroidism   . Bradycardia   . Anemia   . Diverticulosis   . External hemorrhoids   . Constipation   . Macular degeneration   . Seasonal allergies   . Osteoporosis   . Degenerative joint disease   . Palpitations   . Hyperlipidemia     PSHX: Past Surgical History  Procedure Date  . Thyroidectomy   . Tonsillectomy   . Colonoscopy November 2011    External hemorrhoids, diverticulosis, anal papillae    FAMHX: Family History  Problem Relation Age of Onset  . Heart attack Mother     Deceased  . Cancer Father     Deceased  . Cancer Brother     Deceased    Social History:  reports that she has never smoked. She does not have any smokeless tobacco history on file. She reports that she does not drink alcohol or use illicit drugs.  Allergies:  Allergies  Allergen Reactions  . Sulfa Antibiotics Nausea And Vomiting     US Abdomen Complete  06/04/2011  *RADIOLOGY REPORT*  Clinical Data:  Epigastric pain.  COMPLETE ABDOMINAL ULTRASOUND  Comparison:  CT 01/18/2011  Findings:  Gallbladder:  No gallstones, gallbladder wall thickening, or pericholecystic fluid.  Common bile duct:   Normal caliber, 5 mm.  Liver:  No focal lesion identified.  Within normal limits in parenchymal echogenicity.  IVC:  Appears normal.  Pancreas:  Not well visualized due to overlying bowel  gas.  Spleen:  Within normal limits in size and echotexture.  Right Kidney:   Normal in size and parenchymal echogenicity.  No evidence of mass or hydronephrosis.  Left Kidney:  Normal in size and parenchymal echogenicity.  No evidence of mass or hydronephrosis.  Abdominal aorta:  No aneurysm identified.  IMPRESSION: No acute findings.  Original Report Authenticated By: Cyndie Chime, M.D.    ROS  As stated in the HPI and negative for all other systems.  Physical Exam  Vitals:Blood pressure 150/69, pulse 42, temperature 97.7 F (36.5 C), temperature source Oral, resp. rate 19, height 5\' 5"  (1.651 m), weight 65.2 kg (143 lb 11.8 oz), SpO2 95.00%.  Well appearing elderly woman, NAD HEENT: Unremarkable Neck:  No JVD, no thyromegally Lungs:  Clear with no wheezes, rales, or rhonchi. No increased work of breathing. HEART:  Regular brady rhythm, no murmurs, no rubs, no clicks Abd:  Flat, positive bowel sounds, no organomegally, no rebound, no guarding Ext:  2 plus pulses, no edema, no cyanosis, no clubbing Skin:  No rashes no nodules Neuro:  CN II through XII intact, motor grossly intact  Assessment/Plan: 1. Symptomatic bradycardia 2. HTN 3. PAF, not well documented Rec: I have discussed the treatment options with the patient and have recommended proceeding with PPM. I suspect that her initial symptoms for presentation are related to atrial fib. Once her PPM is  in place, we can monitor this. She is quite bradycardic on no AV nodal blocking drugs. The risks/benefits/goals/expectations of PPM have been discussed with the patient and she wishes to proceed.  Sharlot Gowda TaylorMD 06/06/2011, 8:39 AM

## 2011-06-07 ENCOUNTER — Encounter (HOSPITAL_COMMUNITY)
Admission: EM | Disposition: A | Discharge status: Nursing Home (Includes ICF) | Payer: Self-pay | Source: Home / Self Care | Attending: Internal Medicine

## 2011-06-07 DIAGNOSIS — I498 Other specified cardiac arrhythmias: Secondary | ICD-10-CM

## 2011-06-07 HISTORY — PX: PERMANENT PACEMAKER INSERTION: SHX5480

## 2011-06-07 SURGERY — PERMANENT PACEMAKER INSERTION
Anesthesia: LOCAL

## 2011-06-07 MED ORDER — MIDAZOLAM HCL 5 MG/5ML IJ SOLN
INTRAMUSCULAR | Status: AC
  Filled 2011-06-07: qty 5

## 2011-06-07 MED ORDER — ACETAMINOPHEN 325 MG PO TABS
325.0000 mg | ORAL_TABLET | ORAL | Status: DC | PRN
  Administered 2011-06-07 – 2011-06-08 (×3): 650 mg via ORAL
  Filled 2011-06-07 (×3): qty 2

## 2011-06-07 MED ORDER — LIDOCAINE HCL (PF) 1 % IJ SOLN
INTRAMUSCULAR | Status: AC
  Filled 2011-06-07: qty 60

## 2011-06-07 MED ORDER — ONDANSETRON HCL 4 MG/2ML IJ SOLN
4.0000 mg | Freq: Four times a day (QID) | INTRAMUSCULAR | Status: DC | PRN
  Administered 2011-06-08: 4 mg via INTRAVENOUS
  Filled 2011-06-07: qty 2

## 2011-06-07 MED ORDER — FENTANYL CITRATE 0.05 MG/ML IJ SOLN
INTRAMUSCULAR | Status: AC
  Filled 2011-06-07: qty 2

## 2011-06-07 MED ORDER — CEFAZOLIN SODIUM 1-5 GM-% IV SOLN
1.0000 g | Freq: Four times a day (QID) | INTRAVENOUS | Status: AC
  Administered 2011-06-07 – 2011-06-08 (×3): 1 g via INTRAVENOUS
  Filled 2011-06-07 (×3): qty 50

## 2011-06-07 MED ORDER — OXYCODONE HCL 5 MG PO TABS
5.0000 mg | ORAL_TABLET | ORAL | Status: DC | PRN
  Administered 2011-06-07 – 2011-06-08 (×3): 5 mg via ORAL
  Filled 2011-06-07 (×3): qty 1

## 2011-06-07 MED ORDER — HEPARIN (PORCINE) IN NACL 2-0.9 UNIT/ML-% IJ SOLN
INTRAMUSCULAR | Status: AC
  Filled 2011-06-07: qty 1000

## 2011-06-07 NOTE — Progress Notes (Signed)
Clinical Social Worker received referral for patient being admitted from facility. CSW completed psychosocial assessment, which can be found in the shadow chart. Patient is from ALF, [facility]. Plan is to return back to facility when medically stable. Family wishes to transport patient back to facility when discharged. CSW will complete FL2 for MD's signature, which can be found in the shadow chart. CSW will continue to follow.  Rozetta Nunnery MSW, [employer] [phone]

## 2011-06-07 NOTE — Progress Notes (Signed)
Patient ID: [name], female   DOB: 01-12-1923, 78 y.o.   MRN: [id number] Subjective:  Stable overnight with no long pauses.  Objective:  Vital Signs in the last 24 hours: Temp:  [97.7 F (36.5 C)-98.5 F (36.9 C)] 97.7 F (36.5 C) (02/14 0513) Pulse Rate:  [48-62] 56  (02/14 0513) Resp:  [16-18] 16  (02/14 0513) BP: (124-149)/(58-70) 138/58 mmHg (02/14 0513) SpO2:  [95 %-97 %] 95 % (02/14 0513)  Intake/Output from previous day: 02/13 0701 - 02/14 0700 In: 600 [P.O.:600] Out: 300 [Urine:300] Intake/Output from this shift:    Physical Exam: Well appearing elderly woman, NAD HEENT: Unremarkable Neck:  No JVD, no thyromegally Lungs:  Clear with no wheezes. HEART:  Regular rate rhythm, no murmurs, no rubs, no clicks Abd:  Flat, positive bowel sounds, no organomegally, no rebound, no guarding Ext:  2 plus pulses, no edema, no cyanosis, no clubbing Skin:  No rashes no nodules Neuro:  CN II through XII intact, motor grossly intact  Lab Results: No results found for this basename: WBC:2,HGB:2,PLT:2 in the last 72 hours No results found for this basename: NA:2,K:2,CL:2,CO2:2,GLUCOSE:2,BUN:2,CREATININE:2 in the last 72 hours No results found for this basename: TROPONINI:2,CK,MB:2 in the last 72 hours Hepatic Function Panel No results found for this basename: PROT,ALBUMIN,AST,ALT,ALKPHOS,BILITOT,BILIDIR,IBILI in the last 72 hours No results found for this basename: CHOL in the last 72 hours No results found for this basename: PROTIME in the last 72 hours  Imaging: No results found.  Cardiac Studies: Tele - NSR/Sinus brady Assessment/Plan:  1. Symptomatic bradycardia 2. HTN Plan: for PPM today. I have discussed the risks/benefits/goals/expectations of the procedure with the patient and she wishes to proceed.  LOS: 4 days    Lewayne Bunting 06/07/2011, 1:38 PM

## 2011-06-07 NOTE — Progress Notes (Signed)
Patient transferred to [facility] and will be followed by CSW there under Cardiology Service Line- I have communicated to CSW- patient from [facility]F and should return there at d/c. Reece Levy, MSW, [facility] [phone]

## 2011-06-07 NOTE — Op Note (Signed)
DDD PPM implanted via left subclavian vein without immediate complication. [id number]

## 2011-06-08 ENCOUNTER — Inpatient Hospital Stay (HOSPITAL_COMMUNITY): Payer: Medicare Other

## 2011-06-08 ENCOUNTER — Other Ambulatory Visit: Payer: Self-pay

## 2011-06-08 MED ORDER — PSYLLIUM 95 % PO PACK: 1.0000 | PACK | Freq: Every day | ORAL | Status: DC

## 2011-06-08 MED ORDER — CHOLECALCIFEROL 10 MCG (400 UNIT) PO TABS: 400.0000 [IU] | ORAL_TABLET | Freq: Every day | ORAL | Status: DC

## 2011-06-08 NOTE — Progress Notes (Signed)
Pt discharged to SNF. IV removed and intact. Telemetry discontinued. Pt's ride is on their way.

## 2011-06-08 NOTE — Progress Notes (Addendum)
CSW contacted facility to let them know that the patient would be returning. CSW also contacted patient's family to inform them of this. They plan to come and pick up the patient and return her to the facility.  Please contact me if you have any questions.  [phone]

## 2011-06-08 NOTE — Discharge Instructions (Signed)
   Supplemental Discharge Instructions for  Pacemaker/Defibrillator Patients  Activity No heavy lifting or vigorous activity with your left/right arm for 6 to 8 weeks.  Do not raise your left/right arm above your head for one week.  Gradually raise your affected arm as drawn below.                   February 18th         February 19th         February 20th       February 21st  WOUND CARE   Keep the wound area clean and dry.  Do not get this area wet for one week. No showers for one week; you may shower on      February 21st        .   The tape/steri-strips on your wound will fall off; do not pull them off.  No bandage is needed on the site.  DO  NOT apply any creams, oils, or ointments to the wound area.   If you notice any drainage or discharge from the wound, any swelling or bruising at the site, or you develop a fever > 101? F after you are discharged home, call the office at once.  Special Instructions   You are still able to use cellular telephones; use the ear opposite the side where you have your pacemaker/defibrillator.  Avoid carrying your cellular phone near your device.   When traveling through airports, show security personnel your identification card to avoid being screened in the metal detectors.  Ask the security personnel to use the hand wand.   Avoid arc welding equipment, MRI testing (magnetic resonance imaging), TENS units (transcutaneous nerve stimulators).  Call the office for questions about other devices.   Avoid electrical appliances that are in poor condition or are not properly grounded.   Microwave ovens are safe to be near or to operate.  Additional information for defibrillator patients should your device go off:   If your device goes off ONCE and you feel fine afterward, notify the device clinic nurses.   If your device goes off ONCE and you do not feel well afterward, call 911.   If your device goes off TWICE, call 911.   If your device goes off THREE times  in one day, call 911.  DO NOT DRIVE YOURSELF OR A FAMILY MEMBER WITH A DEFIBRILLATOR TO THE HOSPITAL--CALL 911.

## 2011-06-08 NOTE — Op Note (Signed)
NAMESHANTY, [name] NO.:  [id number]  MEDICAL RECORD NO.:  [id number]  LOCATION:  2027                         FACILITY:  [facility]  PHYSICIAN:  Doylene Canning. Ladona Ridgel, MD    DATE OF BIRTH:  1922/08/22  DATE OF PROCEDURE:  06/07/2011 DATE OF DISCHARGE:                              OPERATIVE REPORT   PROCEDURE PERFORMED:  Insertion of a dual-chamber pacemaker.  INTRODUCTION:  The patient is a very pleasant elderly woman with symptomatic bradycardia who was admitted with syncope and chest pain. She is now referred for permanent pacemaker insertion.  PROCEDURE:  After informed consent was obtained, the patient was taken to the diagnostic EP lab in a fasting state.  After usual preparation and draping, intravenous fentanyl and midazolam was given for sedation. Lidocaine 30 mL was infiltrated into the left infraclavicular region.  A 5-cm incision was carried out over this region.  Electrocautery was utilized to dissect down to the fascial plane.  Left subclavian vein was punctured and a St. Jude model 2088T, 52 cm active fixation pacing lead, serial #[id number] was advanced into the right ventricle and the St. Jude model 2088T, 40 6 cm active fixation pacing lead, serial #[id number], was advanced into the right atrium.  Mapping was carried out in the right ventricle first and at the final site, the R-wave was measured at 15 mV and the pacing impedance was 850 ohms.  There was a large injury current with active fixation of the lead, and the threshold was 0.8 V at 0.5 msec.  With the ventricular lead in satisfactory position, attention was then turned to placement of the atrial lead, was placed in anterolateral portion of the right atrium.  The P-waves measured 1.5 to 2 mV and the pacing threshold was 0.9 V at 0.5 msec.  Pacing impedance was 400 ohms. 10 V pacing in both in the atrium and ventricle did not stimulate the diaphragm.  With both the atrial and ventricular leads in  satisfactory position, they were secured to the subpectoral fascia with a figure-of- eight silk suture.  The sewing sleeve was secured with silk suture. Electrocautery was utilized to make subcutaneous pocket.  Antibiotic irrigation was utilized to irrigate the pocket.  Electrocautery was utilized to assure hemostasis.  The St. Jude accent DR RF dual-chamber pacemaker, serial [id number] was connected to the atrial and ventricular leads, and placed back in the subcutaneous pocket.  The pocket was irrigated with antibiotic irrigation.  The incision was closed with 2-0 and 3-0 Vicryl.  Benzoin and Steri-Strips were painted on the skin, pressure dressing was applied, and the patient was returned to her room in satisfactory condition.  COMPLICATIONS:  There were no immediate procedure complications.  RESULTS:  Demonstrate successful implantation of a St. Jude dual-chamber pacemaker.  The patient was symptomatic bradycardia.     Doylene Canning. Ladona Ridgel, MD     GWT/MEDQ  D:  06/07/2011  T:  06/08/2011  Job:  [id number]  cc:   Catalina Pizza, M.D. Gerrit Friends. Dietrich Pates, MD, [facility]

## 2011-06-08 NOTE — Progress Notes (Signed)
Educated pt and daughter-in-law about arm restrictions and progression of movement. Removed gauze dressing from incision site and painted with beta dine. Steri strips intact. Instructed pt to wait to shower for 1 week and gave her follow up appointments. Package of discharge instructions given to daughter-in-law to give to SNF.

## 2011-06-08 NOTE — Progress Notes (Signed)
Pt ambulated around entire unit with no assistance. Tolerated well, no SOB on room air.

## 2011-06-08 NOTE — Progress Notes (Signed)
Subjective: Some CP at pacer site  Better than yesterday.  No SOB.  A little nausea whilein XRAY Objective: Filed Vitals:   06/07/11 1700 06/07/11 1730 06/07/11 2118 06/08/11 0534  BP: 125/95 114/96 115/68 108/72  Pulse: 60 62 60 60  Temp:   98 F (36.7 C) 97.6 F (36.4 C)  TempSrc:   Oral Oral  Resp: 18 18 18 18   Height:      Weight:      SpO2: 97% 96% 94% 94%   Weight change:  No intake or output data in the 24 hours ending 06/08/11 0855  General: Alert, awake, oriented x3, in no acute distress Neck:  JVP is normal Heart: Regular rate and rhythm, without murmurs, rubs, gallops.  Pacer site:  Small amt blood on dressing. Lungs: Clear to auscultation.  No rales or wheezes. Exemities:  No edema.   Neuro: Grossly intact, nonfocal.  Tele:  PAced   Lab Results: No results found for this or any previous visit (from the past 24 hour(s)).  Studies/Results: Dg Chest 2 View  06/08/2011  *RADIOLOGY REPORT*  Clinical Data: Post pacemaker insertion, chest soreness  CHEST - 2 VIEW  Comparison: Portable chest x-ray of 06/03/2011  Findings: A dual lead permanent pacemaker is now present.  No pneumothorax is seen.  The lungs are clear.  Mild cardiomegaly is stable.  The bones are diffusely osteopenic.  IMPRESSION: Dual lead permanent pacemaker present.  No active lung disease.  Original Report Authenticated By: Juline Patch, M.D.    Medications: I have reviewed the patient's current medications.   Patient Active Hospital Problem List: ANEMIA (01/03/2010)   Assessment: Will need to be followed as outpatient.   Plan:  Chest tightness (06/03/2011)   Assessment: Felt secondary to bradycardia.  Now with pacer.  Some pain at site.   Plan:  FOllow. Bradycardia (06/03/2011)   Assessment: s/p PPM.   Plan:  Hypertension (06/03/2011)   Assessment: I would follow for now.   Plan:   Will get patient out of bed  Anticipate D/C today.  LOS: 5 days   Dietrich Pates 06/08/2011, 8:55 AM

## 2011-06-08 NOTE — Discharge Summary (Signed)
CARDIOLOGY DISCHARGE SUMMARY   Patient ID: [name] MRN: [id number] DOB/AGE: 1922-05-16 78 y.o.  Admit date: 06/03/2011 Discharge date: 06/08/2011  Primary Discharge Diagnosis:  Bradycardia Secondary Discharge Diagnosis:  Patient Active Problem List  Diagnoses  . HYPERLIPIDEMIA, FAMILIAL  . ANEMIA  . BRADYARRHYTHMIA  . PALPITATIONS  . Chest tightness  . Bradycardia  . Hypertension  . Acute renal insufficiency  . Epigastric abdominal tenderness  . Hypothyroidism    Consults: EP  Procedures: Insertion of a dual-chamber pacemaker,St. Jude accent DR RF dual-chamber  pacemaker, serial [id number]  , 2-View CXR, abdominal US, 2D echocardiogram   Hospital Course: [name] is an 78 year old female with a history of bradycardia who came to the hospital for chest pain on 06/03/2011. She was admitted for further evaluation and treatment.   Her cardiac enzymes were negative for MI. Her TSH was elevated  She was noted to have bradycardia as well as pauses up to 2.8 seconds on telemetry. It was felt that her presenting symptoms were most likely due to the bradycardia and EP was asked to see her. Dr Ladona Ridgel saw [name] and recommended a pacemaker. She had a St. Jude accent DR RF dual-chamber pacemaker, serial [id number] inserted on 06/07/2011 without complication. She tolerated the procedure well. Her post-procedure CXR was without PTX. On 2/15, she was seen by Dr Tenny Craw. She was evaluated and considered stable for discharge, to follow up as an outpatient.    Labs:   Lab Results  Component Value Date   WBC 3.5* 06/04/2011   HGB 9.7* 06/04/2011   HCT 28.1* 06/04/2011   MCV 97.2 06/04/2011   PLT 236 06/04/2011    Lab 06/04/11 0507 06/03/11 1110  NA 137 --  K 4.3 --  CL 103 --  CO2 28 --  BUN 17 --  CREATININE 1.10 --  CALCIUM 8.8 --  PROT -- 6.8  BILITOT -- 0.4  ALKPHOS -- 67  ALT -- 15  AST -- 24  GLUCOSE 85 --   Lab Results  Component Value Date   CKTOTAL 96 06/04/2011   CKMB  1.8 06/04/2011   TROPONINI <0.30 06/04/2011    Lipid Panel     Component Value Date/Time   CHOL 197 06/04/2011 0507   TRIG 103 06/04/2011 0507   HDL 61 06/04/2011 0507   CHOLHDL 3.2 06/04/2011 0507   VLDL 21 06/04/2011 0507   LDLCALC 115* 06/04/2011 0507   Lab Results  Component Value Date   TSH 11.896* 06/03/2011  .last   Radiology: Dg Chest 2 View  06/08/2011  *RADIOLOGY REPORT*  Clinical Data: Post pacemaker insertion, chest soreness  CHEST - 2 VIEW  Comparison: Portable chest x-ray of 06/03/2011  Findings: A dual lead permanent pacemaker is now present.  No pneumothorax is seen.  The lungs are clear.  Mild cardiomegaly is stable.  The bones are diffusely osteopenic.  IMPRESSION: Dual lead permanent pacemaker present.  No active lung disease.  Original Report Authenticated By: Juline Patch, M.D.   US Abdomen Complete  06/04/2011  *RADIOLOGY REPORT*  Clinical Data:  Epigastric pain.  COMPLETE ABDOMINAL ULTRASOUND  Comparison:  CT 01/18/2011  Findings:  Gallbladder:  No gallstones, gallbladder wall thickening, or pericholecystic fluid.  Common bile duct:   Normal caliber, 5 mm.  Liver:  No focal lesion identified.  Within normal limits in parenchymal echogenicity.  IVC:  Appears normal.  Pancreas:  Not well visualized due to overlying bowel gas.  Spleen:  Within  normal limits in size and echotexture.  Right Kidney:   Normal in size and parenchymal echogenicity.  No evidence of mass or hydronephrosis.  Left Kidney:  Normal in size and parenchymal echogenicity.  No evidence of mass or hydronephrosis.  Abdominal aorta:  No aneurysm identified.  IMPRESSION: No acute findings.  Original Report Authenticated By: Cyndie Chime, M.D.   Dg Chest Portable 1 View  06/03/2011  *RADIOLOGY REPORT*  Clinical Data: Chest pain  PORTABLE CHEST - 1 VIEW  Comparison: 04/28/2010  Findings: Chronic interstitial markings/emphysematous changes. No pleural effusion or pneumothorax.  The heart is top normal in size.   IMPRESSION: No evidence of acute cardiopulmonary disease.  Chronic interstitial markings/emphysematous changes.  Original Report Authenticated By: Charline Bills, M.D.    EKG: 08-Jun-2011 05:04:50 Atrial paced rhythm - new since prior tracing Abnormal ECG 31mm/s 76mm/mV 150Hz  7.1.1 12SL 241 HD CID: 1 Vent. rate 60 BPM  Echo: 06/04/2011 Study Conclusions - Left ventricle: Mild to moderate LVH with disproportionate septal thickening. Systolic function was hyperdynamic. The estimated ejection fraction was in the range of 70% to 75%. Wall motion was normal; there were no regional wall motion abnormalities. - Aortic valve: Mildly calcified annulus. Mildly calcified leaflets. Trivial regurgitation. - Left atrium: The atrium was mildly to moderately dilated. - Right atrium: The atrium was mildly to moderately dilated. - Atrial septum: No defect or patent foramen ovale was identified. - Pulmonary arteries: PA peak pressure: 36mm Hg (S). Impressions:  - Compared to the prior study performed 12/29/09, there has been no significant interval change.    FOLLOW UP PLANS AND APPOINTMENTS  Allergies  Allergen Reactions  . Sulfa Antibiotics Nausea And Vomiting    Discharge Orders    Future Appointments: Provider: Department: Dept Phone: Center:   06/20/2011 3:30 PM Vella Kohler [facility] [phone] [id number]   09/03/2011 11:00 AM Lewayne Bunting, MD [facility] [phone] [facility]     Medication List  As of 06/08/2011  1:51 PM   STOP taking these medications         acetaminophen 500 MG tablet      ondansetron 4 MG tablet         TAKE these medications         acetaminophen 650 MG CR tablet   Commonly known as: TYLENOL   Take 650 mg by mouth every 8 (eight) hours as needed. For pain      alendronate 70 MG tablet   Commonly known as: FOSAMAX   Take 70 mg by mouth every 7 (seven) days. Take with a full glass of water on an empty stomach on Fridays of  each week      ALPRAZolam 0.25 MG tablet   Commonly known as: XANAX   Take 0.25 mg by mouth every 8 (eight) hours as needed. For anxiety      amLODipine 2.5 MG tablet   Commonly known as: NORVASC   Take 2.5 mg by mouth daily.      aspirin EC 81 MG tablet   Take 81 mg by mouth daily.      beta carotene w/minerals tablet   Take 1 tablet by mouth 2 (two) times daily.      cetirizine 10 MG tablet   Commonly known as: ZYRTEC   Take 10 mg by mouth daily.      cholecalciferol 400 UNITS Tabs   Commonly known as: VITAMIN D   Take 1 tablet (400 Units total) by mouth daily.  cycloSPORINE 0.05 % ophthalmic emulsion   Commonly known as: RESTASIS   Place 1 drop into both eyes 2 (two) times daily.      docusate sodium 100 MG capsule   Commonly known as: COLACE   Take 100 mg by mouth at bedtime.      Fish Oil 1000 MG Caps   Take 1 capsule by mouth 2 (two) times daily.      fluticasone 50 MCG/ACT nasal spray   Commonly known as: FLONASE   Place 1 spray into the nose daily as needed. For allergies      guaiFENesin-dextromethorphan 100-10 MG/5ML syrup   Commonly known as: ROBITUSSIN DM   Take 5 mLs by mouth every 4 (four) hours as needed. For cough      hydrALAZINE 25 MG tablet   Commonly known as: APRESOLINE   Take 25 mg by mouth 2 (two) times daily.      levothyroxine 75 MCG tablet   Commonly known as: SYNTHROID, LEVOTHROID   Take 75 mcg by mouth daily.      lisinopril 20 MG tablet   Commonly known as: PRINIVIL,ZESTRIL   Take 20 mg by mouth 2 (two) times daily.      lubiprostone 24 MCG capsule   Commonly known as: AMITIZA   Take 24 mcg by mouth 2 (two) times daily as needed. For stomach/bowel      Magnesium 250 MG Tabs   Take 1 tablet by mouth daily.      magnesium hydroxide 400 MG/5ML suspension   Commonly known as: MILK OF MAGNESIA   Take 30 mLs by mouth daily as needed. For stomach relief      ONE-A-DAY WOMENS FORMULA PO   Take 1 tablet by mouth daily.       polyethylene glycol powder powder   Commonly known as: GLYCOLAX/MIRALAX   Take 17 g by mouth daily.      psyllium 95 % Pack   Commonly known as: HYDROCIL/METAMUCIL   Take 1 packet by mouth daily.      traMADol 50 MG tablet   Commonly known as: ULTRAM   Take 50 mg by mouth 2 (two) times daily as needed. For pain      triamcinolone cream 0.5 %   Commonly known as: KENALOG   Apply 1 application topically 2 (two) times daily as needed. For rash           BRING ALL MEDICATIONS WITH YOU TO FOLLOW UP APPOINTMENTS  Time spent with patient to include physician time: 45 min Signed: Theodore Demark 06/08/2011, 11:41 AM Co-Sign MD

## 2011-06-12 DIAGNOSIS — I10 Essential (primary) hypertension: Secondary | ICD-10-CM | POA: Diagnosis not present

## 2011-06-12 DIAGNOSIS — R0789 Other chest pain: Secondary | ICD-10-CM | POA: Diagnosis not present

## 2011-06-12 DIAGNOSIS — M159 Polyosteoarthritis, unspecified: Secondary | ICD-10-CM | POA: Diagnosis not present

## 2011-06-12 DIAGNOSIS — Z950 Presence of cardiac pacemaker: Secondary | ICD-10-CM | POA: Diagnosis not present

## 2011-06-12 DIAGNOSIS — D649 Anemia, unspecified: Secondary | ICD-10-CM | POA: Diagnosis not present

## 2011-06-12 DIAGNOSIS — H543 Unqualified visual loss, both eyes: Secondary | ICD-10-CM | POA: Diagnosis not present

## 2011-06-12 DIAGNOSIS — H3530 Unspecified macular degeneration: Secondary | ICD-10-CM | POA: Diagnosis not present

## 2011-06-12 DIAGNOSIS — Z48812 Encounter for surgical aftercare following surgery on the circulatory system: Secondary | ICD-10-CM | POA: Diagnosis not present

## 2011-06-13 DIAGNOSIS — D649 Anemia, unspecified: Secondary | ICD-10-CM | POA: Diagnosis not present

## 2011-06-13 DIAGNOSIS — R0789 Other chest pain: Secondary | ICD-10-CM | POA: Diagnosis not present

## 2011-06-13 DIAGNOSIS — Z950 Presence of cardiac pacemaker: Secondary | ICD-10-CM | POA: Diagnosis not present

## 2011-06-13 DIAGNOSIS — H543 Unqualified visual loss, both eyes: Secondary | ICD-10-CM | POA: Diagnosis not present

## 2011-06-13 DIAGNOSIS — Z48812 Encounter for surgical aftercare following surgery on the circulatory system: Secondary | ICD-10-CM | POA: Diagnosis not present

## 2011-06-13 DIAGNOSIS — I10 Essential (primary) hypertension: Secondary | ICD-10-CM | POA: Diagnosis not present

## 2011-06-15 DIAGNOSIS — Z48812 Encounter for surgical aftercare following surgery on the circulatory system: Secondary | ICD-10-CM | POA: Diagnosis not present

## 2011-06-15 DIAGNOSIS — Z950 Presence of cardiac pacemaker: Secondary | ICD-10-CM | POA: Diagnosis not present

## 2011-06-15 DIAGNOSIS — D649 Anemia, unspecified: Secondary | ICD-10-CM | POA: Diagnosis not present

## 2011-06-15 DIAGNOSIS — I10 Essential (primary) hypertension: Secondary | ICD-10-CM | POA: Diagnosis not present

## 2011-06-15 DIAGNOSIS — H543 Unqualified visual loss, both eyes: Secondary | ICD-10-CM | POA: Diagnosis not present

## 2011-06-15 DIAGNOSIS — R0789 Other chest pain: Secondary | ICD-10-CM | POA: Diagnosis not present

## 2011-06-19 DIAGNOSIS — Z950 Presence of cardiac pacemaker: Secondary | ICD-10-CM | POA: Diagnosis not present

## 2011-06-19 DIAGNOSIS — D649 Anemia, unspecified: Secondary | ICD-10-CM | POA: Diagnosis not present

## 2011-06-19 DIAGNOSIS — Z48812 Encounter for surgical aftercare following surgery on the circulatory system: Secondary | ICD-10-CM | POA: Diagnosis not present

## 2011-06-19 DIAGNOSIS — I10 Essential (primary) hypertension: Secondary | ICD-10-CM | POA: Diagnosis not present

## 2011-06-19 DIAGNOSIS — R0789 Other chest pain: Secondary | ICD-10-CM | POA: Diagnosis not present

## 2011-06-19 DIAGNOSIS — H543 Unqualified visual loss, both eyes: Secondary | ICD-10-CM | POA: Diagnosis not present

## 2011-06-20 ENCOUNTER — Encounter: Payer: Self-pay | Admitting: Internal Medicine

## 2011-06-20 ENCOUNTER — Ambulatory Visit (INDEPENDENT_AMBULATORY_CARE_PROVIDER_SITE_OTHER): Payer: Medicare Other | Admitting: *Deleted

## 2011-06-20 DIAGNOSIS — Z48812 Encounter for surgical aftercare following surgery on the circulatory system: Secondary | ICD-10-CM | POA: Diagnosis not present

## 2011-06-20 DIAGNOSIS — R002 Palpitations: Secondary | ICD-10-CM

## 2011-06-20 DIAGNOSIS — I498 Other specified cardiac arrhythmias: Secondary | ICD-10-CM

## 2011-06-20 DIAGNOSIS — D649 Anemia, unspecified: Secondary | ICD-10-CM | POA: Diagnosis not present

## 2011-06-20 DIAGNOSIS — R001 Bradycardia, unspecified: Secondary | ICD-10-CM

## 2011-06-20 DIAGNOSIS — H543 Unqualified visual loss, both eyes: Secondary | ICD-10-CM | POA: Diagnosis not present

## 2011-06-20 DIAGNOSIS — I10 Essential (primary) hypertension: Secondary | ICD-10-CM | POA: Diagnosis not present

## 2011-06-20 DIAGNOSIS — R0789 Other chest pain: Secondary | ICD-10-CM | POA: Diagnosis not present

## 2011-06-20 DIAGNOSIS — Z950 Presence of cardiac pacemaker: Secondary | ICD-10-CM | POA: Diagnosis not present

## 2011-06-20 LAB — PACEMAKER DEVICE OBSERVATION
AL AMPLITUDE: 3.8 mv
AL IMPEDENCE PM: 400 Ohm
AL THRESHOLD: 0.5 V
BAMS-0003: 70 {beats}/min
BATTERY VOLTAGE: 3.0982 V
RV LEAD AMPLITUDE: 12 mv

## 2011-06-20 NOTE — Progress Notes (Signed)
Wound check pacer in clinic  

## 2011-06-21 DIAGNOSIS — H543 Unqualified visual loss, both eyes: Secondary | ICD-10-CM | POA: Diagnosis not present

## 2011-06-21 DIAGNOSIS — R0789 Other chest pain: Secondary | ICD-10-CM | POA: Diagnosis not present

## 2011-06-21 DIAGNOSIS — I10 Essential (primary) hypertension: Secondary | ICD-10-CM | POA: Diagnosis not present

## 2011-06-21 DIAGNOSIS — D649 Anemia, unspecified: Secondary | ICD-10-CM | POA: Diagnosis not present

## 2011-06-21 DIAGNOSIS — Z48812 Encounter for surgical aftercare following surgery on the circulatory system: Secondary | ICD-10-CM | POA: Diagnosis not present

## 2011-06-21 DIAGNOSIS — Z950 Presence of cardiac pacemaker: Secondary | ICD-10-CM | POA: Diagnosis not present

## 2011-06-22 DIAGNOSIS — Z48812 Encounter for surgical aftercare following surgery on the circulatory system: Secondary | ICD-10-CM | POA: Diagnosis not present

## 2011-06-22 DIAGNOSIS — D649 Anemia, unspecified: Secondary | ICD-10-CM | POA: Diagnosis not present

## 2011-06-22 DIAGNOSIS — R0789 Other chest pain: Secondary | ICD-10-CM | POA: Diagnosis not present

## 2011-06-22 DIAGNOSIS — Z950 Presence of cardiac pacemaker: Secondary | ICD-10-CM | POA: Diagnosis not present

## 2011-06-22 DIAGNOSIS — H543 Unqualified visual loss, both eyes: Secondary | ICD-10-CM | POA: Diagnosis not present

## 2011-06-22 DIAGNOSIS — I10 Essential (primary) hypertension: Secondary | ICD-10-CM | POA: Diagnosis not present

## 2011-06-25 DIAGNOSIS — R5381 Other malaise: Secondary | ICD-10-CM | POA: Diagnosis not present

## 2011-06-25 DIAGNOSIS — I10 Essential (primary) hypertension: Secondary | ICD-10-CM | POA: Diagnosis not present

## 2011-06-25 DIAGNOSIS — Z48812 Encounter for surgical aftercare following surgery on the circulatory system: Secondary | ICD-10-CM | POA: Diagnosis not present

## 2011-06-25 DIAGNOSIS — H543 Unqualified visual loss, both eyes: Secondary | ICD-10-CM | POA: Diagnosis not present

## 2011-06-25 DIAGNOSIS — Z950 Presence of cardiac pacemaker: Secondary | ICD-10-CM | POA: Diagnosis not present

## 2011-06-25 DIAGNOSIS — R0789 Other chest pain: Secondary | ICD-10-CM | POA: Diagnosis not present

## 2011-06-25 DIAGNOSIS — K219 Gastro-esophageal reflux disease without esophagitis: Secondary | ICD-10-CM | POA: Diagnosis not present

## 2011-06-25 DIAGNOSIS — E039 Hypothyroidism, unspecified: Secondary | ICD-10-CM | POA: Diagnosis not present

## 2011-06-25 DIAGNOSIS — D649 Anemia, unspecified: Secondary | ICD-10-CM | POA: Diagnosis not present

## 2011-06-25 DIAGNOSIS — R5383 Other fatigue: Secondary | ICD-10-CM | POA: Diagnosis not present

## 2011-06-27 DIAGNOSIS — Z48812 Encounter for surgical aftercare following surgery on the circulatory system: Secondary | ICD-10-CM | POA: Diagnosis not present

## 2011-06-27 DIAGNOSIS — H543 Unqualified visual loss, both eyes: Secondary | ICD-10-CM | POA: Diagnosis not present

## 2011-06-27 DIAGNOSIS — D649 Anemia, unspecified: Secondary | ICD-10-CM | POA: Diagnosis not present

## 2011-06-27 DIAGNOSIS — R0789 Other chest pain: Secondary | ICD-10-CM | POA: Diagnosis not present

## 2011-06-27 DIAGNOSIS — I10 Essential (primary) hypertension: Secondary | ICD-10-CM | POA: Diagnosis not present

## 2011-06-27 DIAGNOSIS — Z950 Presence of cardiac pacemaker: Secondary | ICD-10-CM | POA: Diagnosis not present

## 2011-06-29 DIAGNOSIS — R0789 Other chest pain: Secondary | ICD-10-CM | POA: Diagnosis not present

## 2011-06-29 DIAGNOSIS — I10 Essential (primary) hypertension: Secondary | ICD-10-CM | POA: Diagnosis not present

## 2011-06-29 DIAGNOSIS — D649 Anemia, unspecified: Secondary | ICD-10-CM | POA: Diagnosis not present

## 2011-06-29 DIAGNOSIS — Z48812 Encounter for surgical aftercare following surgery on the circulatory system: Secondary | ICD-10-CM | POA: Diagnosis not present

## 2011-06-29 DIAGNOSIS — H543 Unqualified visual loss, both eyes: Secondary | ICD-10-CM | POA: Diagnosis not present

## 2011-06-29 DIAGNOSIS — Z950 Presence of cardiac pacemaker: Secondary | ICD-10-CM | POA: Diagnosis not present

## 2011-07-05 DIAGNOSIS — Z48812 Encounter for surgical aftercare following surgery on the circulatory system: Secondary | ICD-10-CM | POA: Diagnosis not present

## 2011-07-05 DIAGNOSIS — R0789 Other chest pain: Secondary | ICD-10-CM | POA: Diagnosis not present

## 2011-07-05 DIAGNOSIS — D649 Anemia, unspecified: Secondary | ICD-10-CM | POA: Diagnosis not present

## 2011-07-05 DIAGNOSIS — H543 Unqualified visual loss, both eyes: Secondary | ICD-10-CM | POA: Diagnosis not present

## 2011-07-05 DIAGNOSIS — I10 Essential (primary) hypertension: Secondary | ICD-10-CM | POA: Diagnosis not present

## 2011-07-05 DIAGNOSIS — Z950 Presence of cardiac pacemaker: Secondary | ICD-10-CM | POA: Diagnosis not present

## 2011-07-11 DIAGNOSIS — H543 Unqualified visual loss, both eyes: Secondary | ICD-10-CM | POA: Diagnosis not present

## 2011-07-11 DIAGNOSIS — Z48812 Encounter for surgical aftercare following surgery on the circulatory system: Secondary | ICD-10-CM | POA: Diagnosis not present

## 2011-07-11 DIAGNOSIS — D649 Anemia, unspecified: Secondary | ICD-10-CM | POA: Diagnosis not present

## 2011-07-11 DIAGNOSIS — I10 Essential (primary) hypertension: Secondary | ICD-10-CM | POA: Diagnosis not present

## 2011-07-11 DIAGNOSIS — R0789 Other chest pain: Secondary | ICD-10-CM | POA: Diagnosis not present

## 2011-07-11 DIAGNOSIS — Z950 Presence of cardiac pacemaker: Secondary | ICD-10-CM | POA: Diagnosis not present

## 2011-07-13 DIAGNOSIS — D649 Anemia, unspecified: Secondary | ICD-10-CM | POA: Diagnosis not present

## 2011-07-13 DIAGNOSIS — Z48812 Encounter for surgical aftercare following surgery on the circulatory system: Secondary | ICD-10-CM | POA: Diagnosis not present

## 2011-07-13 DIAGNOSIS — R0789 Other chest pain: Secondary | ICD-10-CM | POA: Diagnosis not present

## 2011-07-13 DIAGNOSIS — Z950 Presence of cardiac pacemaker: Secondary | ICD-10-CM | POA: Diagnosis not present

## 2011-07-13 DIAGNOSIS — H543 Unqualified visual loss, both eyes: Secondary | ICD-10-CM | POA: Diagnosis not present

## 2011-07-13 DIAGNOSIS — I10 Essential (primary) hypertension: Secondary | ICD-10-CM | POA: Diagnosis not present

## 2011-07-17 DIAGNOSIS — I10 Essential (primary) hypertension: Secondary | ICD-10-CM | POA: Diagnosis not present

## 2011-07-17 DIAGNOSIS — J0110 Acute frontal sinusitis, unspecified: Secondary | ICD-10-CM | POA: Diagnosis not present

## 2011-07-17 DIAGNOSIS — J210 Acute bronchiolitis due to respiratory syncytial virus: Secondary | ICD-10-CM | POA: Diagnosis not present

## 2011-07-17 DIAGNOSIS — J069 Acute upper respiratory infection, unspecified: Secondary | ICD-10-CM | POA: Diagnosis not present

## 2011-07-21 ENCOUNTER — Encounter (HOSPITAL_COMMUNITY): Payer: Self-pay | Admitting: *Deleted

## 2011-07-21 ENCOUNTER — Inpatient Hospital Stay (HOSPITAL_COMMUNITY)
Admission: EM | Admit: 2011-07-21 | Discharge: 2011-07-22 | DRG: 392 | Disposition: A | Discharge status: Skilled Nursing Facility | Payer: Medicare Other | Attending: Internal Medicine | Admitting: Internal Medicine

## 2011-07-21 DIAGNOSIS — N289 Disorder of kidney and ureter, unspecified: Secondary | ICD-10-CM

## 2011-07-21 DIAGNOSIS — R0789 Other chest pain: Secondary | ICD-10-CM

## 2011-07-21 DIAGNOSIS — R001 Bradycardia, unspecified: Secondary | ICD-10-CM

## 2011-07-21 DIAGNOSIS — H3530 Unspecified macular degeneration: Secondary | ICD-10-CM | POA: Diagnosis present

## 2011-07-21 DIAGNOSIS — Z950 Presence of cardiac pacemaker: Secondary | ICD-10-CM

## 2011-07-21 DIAGNOSIS — A088 Other specified intestinal infections: Principal | ICD-10-CM | POA: Diagnosis present

## 2011-07-21 DIAGNOSIS — E860 Dehydration: Secondary | ICD-10-CM

## 2011-07-21 DIAGNOSIS — K529 Noninfective gastroenteritis and colitis, unspecified: Secondary | ICD-10-CM

## 2011-07-21 DIAGNOSIS — E039 Hypothyroidism, unspecified: Secondary | ICD-10-CM | POA: Diagnosis present

## 2011-07-21 DIAGNOSIS — R110 Nausea: Secondary | ICD-10-CM | POA: Diagnosis not present

## 2011-07-21 DIAGNOSIS — I10 Essential (primary) hypertension: Secondary | ICD-10-CM | POA: Diagnosis not present

## 2011-07-21 DIAGNOSIS — E871 Hypo-osmolality and hyponatremia: Secondary | ICD-10-CM | POA: Diagnosis not present

## 2011-07-21 DIAGNOSIS — I498 Other specified cardiac arrhythmias: Secondary | ICD-10-CM

## 2011-07-21 DIAGNOSIS — K5289 Other specified noninfective gastroenteritis and colitis: Secondary | ICD-10-CM | POA: Diagnosis not present

## 2011-07-21 DIAGNOSIS — E7800 Pure hypercholesterolemia, unspecified: Secondary | ICD-10-CM

## 2011-07-21 DIAGNOSIS — R002 Palpitations: Secondary | ICD-10-CM

## 2011-07-21 DIAGNOSIS — D649 Anemia, unspecified: Secondary | ICD-10-CM

## 2011-07-21 DIAGNOSIS — R10816 Epigastric abdominal tenderness: Secondary | ICD-10-CM

## 2011-07-21 LAB — CBC
HCT: 29.6 % — ABNORMAL LOW (ref 36.0–46.0)
Hemoglobin: 10.6 g/dL — ABNORMAL LOW (ref 12.0–15.0)
MCH: 33.3 pg (ref 26.0–34.0)
MCHC: 35.8 g/dL (ref 30.0–36.0)
MCV: 93.1 fL (ref 78.0–100.0)
RDW: 15.5 % (ref 11.5–15.5)

## 2011-07-21 LAB — DIFFERENTIAL
Basophils Absolute: 0 10*3/uL (ref 0.0–0.1)
Basophils Relative: 0 % (ref 0–1)
Eosinophils Absolute: 0 10*3/uL (ref 0.0–0.7)
Eosinophils Relative: 1 % (ref 0–5)
Monocytes Absolute: 0.7 10*3/uL (ref 0.1–1.0)
Monocytes Relative: 23 % — ABNORMAL HIGH (ref 3–12)
Neutro Abs: 1.5 10*3/uL — ABNORMAL LOW (ref 1.7–7.7)

## 2011-07-21 LAB — COMPREHENSIVE METABOLIC PANEL
AST: 25 U/L (ref 0–37)
Albumin: 3.4 g/dL — ABNORMAL LOW (ref 3.5–5.2)
BUN: 14 mg/dL (ref 6–23)
Calcium: 8.9 mg/dL (ref 8.4–10.5)
Chloride: 86 mEq/L — ABNORMAL LOW (ref 96–112)
Creatinine, Ser: 0.98 mg/dL (ref 0.50–1.10)
Total Bilirubin: 0.4 mg/dL (ref 0.3–1.2)
Total Protein: 6.5 g/dL (ref 6.0–8.3)

## 2011-07-21 MED ORDER — TRAMADOL HCL 50 MG PO TABS
50.0000 mg | ORAL_TABLET | Freq: Two times a day (BID) | ORAL | Status: DC | PRN
  Administered 2011-07-21: 50 mg via ORAL
  Filled 2011-07-21: qty 1

## 2011-07-21 MED ORDER — ALPRAZOLAM 0.25 MG PO TABS
0.2500 mg | ORAL_TABLET | Freq: Three times a day (TID) | ORAL | Status: DC | PRN
  Administered 2011-07-21: 0.25 mg via ORAL
  Filled 2011-07-21: qty 1

## 2011-07-21 MED ORDER — MAGNESIUM OXIDE 400 MG PO TABS
400.0000 mg | ORAL_TABLET | Freq: Every day | ORAL | Status: DC
  Administered 2011-07-21 – 2011-07-22 (×2): 400 mg via ORAL
  Filled 2011-07-21 (×2): qty 1

## 2011-07-21 MED ORDER — MAGNESIUM 250 MG PO TABS: 1.0000 | ORAL_TABLET | Freq: Every day | ORAL | Status: DC

## 2011-07-21 MED ORDER — ONDANSETRON HCL 4 MG PO TABS: 4.0000 mg | ORAL_TABLET | Freq: Four times a day (QID) | ORAL | Status: DC | PRN

## 2011-07-21 MED ORDER — ONDANSETRON HCL 4 MG/2ML IJ SOLN
4.0000 mg | Freq: Once | INTRAMUSCULAR | Status: AC
  Administered 2011-07-21: 4 mg via INTRAVENOUS
  Filled 2011-07-21: qty 2

## 2011-07-21 MED ORDER — SODIUM CHLORIDE 0.9 % IV SOLN: INTRAVENOUS | Status: AC

## 2011-07-21 MED ORDER — CHOLECALCIFEROL 10 MCG (400 UNIT) PO TABS
400.0000 [IU] | ORAL_TABLET | Freq: Every day | ORAL | Status: DC
  Administered 2011-07-22: 400 [IU] via ORAL
  Filled 2011-07-21 (×3): qty 1

## 2011-07-21 MED ORDER — HEPARIN SODIUM (PORCINE) 5000 UNIT/ML IJ SOLN
5000.0000 [IU] | Freq: Three times a day (TID) | INTRAMUSCULAR | Status: DC
  Administered 2011-07-21 – 2011-07-22 (×4): 5000 [IU] via SUBCUTANEOUS
  Filled 2011-07-21 (×4): qty 1

## 2011-07-21 MED ORDER — SODIUM CHLORIDE 0.9 % IJ SOLN
INTRAMUSCULAR | Status: AC
  Filled 2011-07-21: qty 3

## 2011-07-21 MED ORDER — ONDANSETRON HCL 4 MG/2ML IJ SOLN
4.0000 mg | Freq: Four times a day (QID) | INTRAMUSCULAR | Status: DC | PRN
  Administered 2011-07-21 (×2): 4 mg via INTRAVENOUS
  Filled 2011-07-21 (×2): qty 2

## 2011-07-21 MED ORDER — SODIUM CHLORIDE 0.9 % IV SOLN
Freq: Once | INTRAVENOUS | Status: AC
  Administered 2011-07-21: 08:00:00 via INTRAVENOUS

## 2011-07-21 MED ORDER — ONDANSETRON HCL 4 MG/2ML IJ SOLN
4.0000 mg | Freq: Three times a day (TID) | INTRAMUSCULAR | Status: DC | PRN
  Administered 2011-07-21: 4 mg via INTRAVENOUS
  Filled 2011-07-21: qty 2

## 2011-07-21 MED ORDER — PSYLLIUM 95 % PO PACK
1.0000 | PACK | Freq: Every day | ORAL | Status: DC
  Administered 2011-07-21 – 2011-07-22 (×2): 1 via ORAL
  Filled 2011-07-21 (×3): qty 1

## 2011-07-21 MED ORDER — ACETAMINOPHEN 325 MG PO TABS
650.0000 mg | ORAL_TABLET | ORAL | Status: DC | PRN
Start: 2011-07-21 — End: 2011-07-22
  Administered 2011-07-21 – 2011-07-22 (×3): 650 mg via ORAL
  Filled 2011-07-21 (×3): qty 2

## 2011-07-21 MED ORDER — MAGNESIUM HYDROXIDE 400 MG/5ML PO SUSP: 30.0000 mL | Freq: Every day | ORAL | Status: DC | PRN

## 2011-07-21 MED ORDER — FLUTICASONE PROPIONATE 50 MCG/ACT NA SUSP
2.0000 | Freq: Every day | NASAL | Status: DC | PRN
Start: 2011-07-21 — End: 2011-07-22
  Filled 2011-07-21: qty 16

## 2011-07-21 MED ORDER — TRIAMCINOLONE ACETONIDE 0.5 % EX CREA
1.0000 "application " | TOPICAL_CREAM | Freq: Two times a day (BID) | CUTANEOUS | Status: DC
  Filled 2011-07-21: qty 15

## 2011-07-21 MED ORDER — OMEGA-3-ACID ETHYL ESTERS 1 G PO CAPS
1.0000 g | ORAL_CAPSULE | Freq: Every day | ORAL | Status: DC
  Filled 2011-07-21 (×2): qty 1

## 2011-07-21 MED ORDER — POLYETHYLENE GLYCOL 3350 17 G PO PACK
17.0000 g | PACK | Freq: Every day | ORAL | Status: DC
  Administered 2011-07-21: 17 g via ORAL
  Filled 2011-07-21 (×2): qty 1

## 2011-07-21 MED ORDER — ALENDRONATE SODIUM 70 MG PO TABS: 70.0000 mg | ORAL_TABLET | ORAL | Status: DC

## 2011-07-21 MED ORDER — HYDRALAZINE HCL 25 MG PO TABS
25.0000 mg | ORAL_TABLET | Freq: Two times a day (BID) | ORAL | Status: DC
  Administered 2011-07-21 – 2011-07-22 (×3): 25 mg via ORAL
  Filled 2011-07-21 (×3): qty 1

## 2011-07-21 MED ORDER — ONDANSETRON HCL 4 MG/2ML IJ SOLN: 4.0000 mg | Freq: Four times a day (QID) | INTRAMUSCULAR | Status: DC | PRN

## 2011-07-21 MED ORDER — CYCLOSPORINE 0.05 % OP EMUL
1.0000 [drp] | Freq: Two times a day (BID) | OPHTHALMIC | Status: DC
Start: 2011-07-21 — End: 2011-07-22
  Administered 2011-07-21 – 2011-07-22 (×3): 1 [drp] via OPHTHALMIC
  Filled 2011-07-21 (×5): qty 1

## 2011-07-21 MED ORDER — POTASSIUM CHLORIDE IN NACL 40-0.9 MEQ/L-% IV SOLN
INTRAVENOUS | Status: DC
  Administered 2011-07-21 – 2011-07-22 (×2): via INTRAVENOUS
  Filled 2011-07-21 (×4): qty 1000

## 2011-07-21 MED ORDER — ASPIRIN EC 81 MG PO TBEC
81.0000 mg | DELAYED_RELEASE_TABLET | Freq: Every day | ORAL | Status: DC
  Administered 2011-07-21 – 2011-07-22 (×2): 81 mg via ORAL
  Filled 2011-07-21 (×2): qty 1

## 2011-07-21 MED ORDER — LISINOPRIL 10 MG PO TABS
20.0000 mg | ORAL_TABLET | Freq: Two times a day (BID) | ORAL | Status: DC
  Administered 2011-07-21 – 2011-07-22 (×3): 20 mg via ORAL
  Filled 2011-07-21 (×3): qty 2

## 2011-07-21 MED ORDER — AMLODIPINE BESYLATE 5 MG PO TABS
2.5000 mg | ORAL_TABLET | Freq: Every day | ORAL | Status: DC
  Administered 2011-07-21 – 2011-07-22 (×2): 2.5 mg via ORAL
  Filled 2011-07-21 (×2): qty 1

## 2011-07-21 MED ORDER — OCUVITE PO TABS
1.0000 | ORAL_TABLET | Freq: Two times a day (BID) | ORAL | Status: DC
  Administered 2011-07-21 – 2011-07-22 (×2): 1 via ORAL
  Filled 2011-07-21 (×5): qty 1

## 2011-07-21 MED ORDER — DOCUSATE SODIUM 100 MG PO CAPS: 100.0000 mg | ORAL_CAPSULE | Freq: Every day | ORAL | Status: DC

## 2011-07-21 MED ORDER — GUAIFENESIN-DM 100-10 MG/5ML PO SYRP
10.0000 mL | ORAL_SOLUTION | ORAL | Status: DC | PRN
  Administered 2011-07-22 (×2): 10 mL via ORAL
  Filled 2011-07-21 (×2): qty 10

## 2011-07-21 MED ORDER — ONDANSETRON 8 MG/NS 50 ML IVPB
8.0000 mg | Freq: Four times a day (QID) | INTRAVENOUS | Status: DC | PRN
  Filled 2011-07-21: qty 8

## 2011-07-21 NOTE — ED Notes (Signed)
Floor called and asked to wait to bring pt up due to room being cleaned. Pt /family aware may be an hour prior to taking upstairs. nad

## 2011-07-21 NOTE — ED Notes (Signed)
Pt aware being admitted by edp.  Son at bedside. No changes.

## 2011-07-21 NOTE — ED Provider Notes (Signed)
History   This chart was scribed for Marilyn Lyons, MD by Melba Coon. The patient was seen in room APA09/APA09 and the patient's care was started at 7:39AM.   CSN: [id number]  Arrival date & time 07/21/11  0700   First MD Initiated Contact with Patient 07/21/11 [phone]      Chief Complaint  Patient presents with  . Emesis  . Diarrhea    (Consider location/radiation/quality/duration/timing/severity/associated sxs/prior treatment) HPI [name] is a 78 y.o. female who presents to the Emergency Department complaining of persistent, moderate to severe emesis with an onset yesterday; nausea and diarrhea also present. Pt is a resident of [address] and suspects that she may have gotten this symptoms from the other residents. Pt had previous upper respitratoy infection in the last week and was given abx; cough still present but sore throat has improved. Pt is in NAD. Malaise and decreased appetite; slight abd pain present. No HA, fever, neck pain, back pain, CP, or extremity pain, numbness, or tingling. Pt has a pacemaker for her irregular heartbeat and has had no abd surgeries. Allergic to Sulfa abx. No other pertinent medical problems.  Past Medical History  Diagnosis Date  . Hypertension   . Hypothyroidism   . Bradycardia   . Anemia   . Diverticulosis   . External hemorrhoids   . Constipation   . Macular degeneration   . Seasonal allergies   . Osteoporosis   . Degenerative joint disease   . Palpitations   . Hyperlipidemia     Past Surgical History  Procedure Date  . Thyroidectomy   . Tonsillectomy   . Colonoscopy November 2011    External hemorrhoids, diverticulosis, anal papillae    Family History  Problem Relation Age of Onset  . Heart attack Mother     Deceased  . Cancer Father     Deceased  . Cancer Brother     Deceased    History  Substance Use Topics  . Smoking status: Never Smoker   . Smokeless tobacco: Not on file  . Alcohol Use: No    OB  History    Grav Para Term Preterm Abortions TAB SAB Ect Mult Living                  Review of Systems 10 Systems reviewed and all are negative for acute change except as noted in the HPI.   Allergies  Sulfa antibiotics  Home Medications   Current Outpatient Rx  Name Route Sig Dispense Refill  . ACETAMINOPHEN ER 650 MG PO TBCR Oral Take 650 mg by mouth every 8 (eight) hours as needed. For pain    . ALENDRONATE SODIUM 70 MG PO TABS Oral Take 70 mg by mouth every 7 (seven) days. Take with a full glass of water on an empty stomach on Fridays of each week    . ALPRAZOLAM 0.25 MG PO TABS Oral Take 0.25 mg by mouth every 8 (eight) hours as needed. For anxiety    . AMLODIPINE BESYLATE 2.5 MG PO TABS Oral Take 2.5 mg by mouth daily.    . ASPIRIN EC 81 MG PO TBEC Oral Take 81 mg by mouth daily.      . OCUVITE PO TABS Oral Take 1 tablet by mouth 2 (two) times daily.    Marland Kitchen CETIRIZINE HCL 10 MG PO TABS Oral Take 10 mg by mouth daily.    . CHOLECALCIFEROL 400 UNITS PO TABS Oral Take 1 tablet (400 Units total) by  mouth daily. 30 each   . CYCLOSPORINE 0.05 % OP EMUL Both Eyes Place 1 drop into both eyes 2 (two) times daily.      Marland Kitchen DOCUSATE SODIUM 100 MG PO CAPS Oral Take 100 mg by mouth at bedtime.    Marland Kitchen FLUTICASONE PROPIONATE 50 MCG/ACT NA SUSP Nasal Place 1 spray into the nose daily as needed. For allergies    . GUAIFENESIN-DM 100-10 MG/5ML PO SYRP Oral Take 5 mLs by mouth every 4 (four) hours as needed. For cough    . HYDRALAZINE HCL 25 MG PO TABS Oral Take 25 mg by mouth 2 (two) times daily.      Marland Kitchen LEVOTHYROXINE SODIUM 75 MCG PO TABS Oral Take 75 mcg by mouth daily.    Marland Kitchen LISINOPRIL 20 MG PO TABS Oral Take 20 mg by mouth 2 (two) times daily.      . LUBIPROSTONE 24 MCG PO CAPS Oral Take 24 mcg by mouth 2 (two) times daily as needed. For stomach/bowel     . MAGNESIUM 250 MG PO TABS Oral Take 1 tablet by mouth daily.    Marland Kitchen MAGNESIUM HYDROXIDE 400 MG/5ML PO SUSP Oral Take 30 mLs by mouth daily as  needed. For stomach relief    . ONE-A-DAY WOMENS FORMULA PO Oral Take 1 tablet by mouth daily.      Marland Kitchen FISH OIL 1000 MG PO CAPS Oral Take 1 capsule by mouth 2 (two) times daily.      Marland Kitchen POLYETHYLENE GLYCOL 3350 PO POWD Oral Take 17 g by mouth daily.      . PSYLLIUM 95 % PO PACK Oral Take 1 packet by mouth daily. 56 each   . TRAMADOL HCL 50 MG PO TABS Oral Take 50 mg by mouth 2 (two) times daily as needed. For pain    . TRIAMCINOLONE ACETONIDE 0.5 % EX CREA Topical Apply 1 application topically 2 (two) times daily as needed. For rash      BP 139/62  Pulse 60  Temp(Src) 98.7 F (37.1 C) (Oral)  Resp 16  Ht 5\' 5"  (1.651 m)  Wt 149 lb (67.586 kg)  BMI 24.79 kg/m2  Physical Exam  Nursing note and vitals reviewed. Constitutional: She is oriented to person, place, and time. She appears well-developed and well-nourished.       Awake, alert, nontoxic appearance.  HENT:  Head: Normocephalic and atraumatic.  Mouth/Throat: Mucous membranes are dry.  Eyes: EOM are normal. Pupils are equal, round, and reactive to light. Right eye exhibits no discharge. Left eye exhibits no discharge.  Neck: Normal range of motion. Neck supple.  Cardiovascular: Normal rate and regular rhythm.  Exam reveals no gallop and no friction rub.   No murmur heard. Pulmonary/Chest: Effort normal and breath sounds normal. She exhibits no tenderness.  Abdominal: Soft. Bowel sounds are normal. She exhibits no distension. There is tenderness (Mild TTP in all 4 quadrants). There is no rebound and no guarding.  Musculoskeletal: She exhibits no tenderness.       Baseline ROM, no obvious new focal weakness.  Neurological: She is alert and oriented to person, place, and time.       Mental status and motor strength appears baseline for patient and situation.  Skin: Skin is warm. No rash noted. There is pallor.  Psychiatric: She has a normal mood and affect. Her behavior is normal.    ED Course  Procedures (including critical care  time)  COORDINATION OF CARE:  7:58AM - EDMD will order fluids  and Zofran for the pt as well as order blood w/u and UA. 8:45AM - recheck; EDMD reviews lab results and believes pt will be admitted  Results for orders placed during the hospital encounter of 07/21/11  CBC      Component Value Range   WBC 3.2 (*) 4.0 - 10.5 (K/uL)   RBC 3.18 (*) 3.87 - 5.11 (MIL/uL)   Hemoglobin 10.6 (*) 12.0 - 15.0 (g/dL)   HCT 16.1 (*) 09.6 - 46.0 (%)   MCV 93.1  78.0 - 100.0 (fL)   MCH 33.3  26.0 - 34.0 (pg)   MCHC 35.8  30.0 - 36.0 (g/dL)   RDW 04.5  40.9 - 81.1 (%)   Platelets 189  150 - 400 (K/uL)  DIFFERENTIAL      Component Value Range   Neutrophils Relative 46  43 - 77 (%)   Neutro Abs 1.5 (*) 1.7 - 7.7 (K/uL)   Lymphocytes Relative 30  12 - 46 (%)   Lymphs Abs 1.0  0.7 - 4.0 (K/uL)   Monocytes Relative 23 (*) 3 - 12 (%)   Monocytes Absolute 0.7  0.1 - 1.0 (K/uL)   Eosinophils Relative 1  0 - 5 (%)   Eosinophils Absolute 0.0  0.0 - 0.7 (K/uL)   Basophils Relative 0  0 - 1 (%)   Basophils Absolute 0.0  0.0 - 0.1 (K/uL)  COMPREHENSIVE METABOLIC PANEL      Component Value Range   Sodium 121 (*) 135 - 145 (mEq/L)   Potassium 3.8  3.5 - 5.1 (mEq/L)   Chloride 86 (*) 96 - 112 (mEq/L)   CO2 28  19 - 32 (mEq/L)   Glucose, Bld 119 (*) 70 - 99 (mg/dL)   BUN 14  6 - 23 (mg/dL)   Creatinine, Ser 9.14  0.50 - 1.10 (mg/dL)   Calcium 8.9  8.4 - 78.2 (mg/dL)   Total Protein 6.5  6.0 - 8.3 (g/dL)   Albumin 3.4 (*) 3.5 - 5.2 (g/dL)   AST 25  0 - 37 (U/L)   ALT 16  0 - 35 (U/L)   Alkaline Phosphatase 66  39 - 117 (U/L)   Total Bilirubin 0.4  0.3 - 1.2 (mg/dL)   GFR calc non Af Amer 51 (*) >90 (mL/min)   GFR calc Af Amer 59 (*) >90 (mL/min)    No results found.   No diagnosis found.    MDM  The patient has what appears to be norovirus with hyponatremia likely contributing to her feeling badly.  She was hydrated with ns and given zofran, but will require admission due to her sodium level of  121.  I have contacted [facility] and she will be admitted to the internal medicine service.  I personally performed the services described in this documentation, which was scribed in my presence. The recorded information has been reviewed and considered.         Marilyn Lyons, MD 07/21/11 [phone]

## 2011-07-21 NOTE — ED Notes (Signed)
Resident of [facility]. C/O N/V/D since yesterday. NAD

## 2011-07-21 NOTE — H&P (Signed)
[name] MRN: [id number] DOB/AGE: April 01, 1923 78 y.o. Primary Care Physician:HALL,ZACK, MD, MD Admit date: 07/21/2011 Chief Complaint: Nausea and vomiting. HPI: This very pleasant 78 year old lady, who lives at [facility], presents with the above symptoms which started approximately 12 hours ago. Residents at [facility], according to the patient, have had similar symptoms and likely this is part of the recent Norovirus epidemic. She has had no significant diarrhea. She does have some abdominal cramping prior to vomiting. When she presented to the emergency room she was clinically dehydrated and hyponatremic. She has no fever. She recently has had upper respiratory tract symptoms also prior to symptoms starting yesterday.  Past Medical History  Diagnosis Date  . Hypertension   . Hypothyroidism   . Bradycardia   . Anemia   . Diverticulosis   . External hemorrhoids   . Constipation   . Macular degeneration   . Seasonal allergies   . Osteoporosis   . Degenerative joint disease   . Palpitations   . Hyperlipidemia    Past Surgical History  Procedure Date  . Thyroidectomy   . Tonsillectomy   . Colonoscopy November 2011    External hemorrhoids, diverticulosis, anal papillae        Family History  Problem Relation Age of Onset  . Heart attack Mother     Deceased  . Cancer Father     Deceased  . Cancer Brother     Deceased    Social History:  reports that she has never smoked. She does not have any smokeless tobacco history on file. She reports that she does not drink alcohol or use illicit drugs.her husband died 2 years ago. She does not smoke cigarettes and does not drink alcohol. She lives at [facility].   Allergies:  Allergies  Allergen Reactions  . Sulfa Antibiotics Nausea And Vomiting    Medications Prior to Admission  Medication Dose Route Frequency Provider Last Rate Last Dose  . 0.9 %  sodium chloride infusion   Intravenous Once [name], MD  1,000 mL/hr at 07/21/11 0804    . 0.9 %  sodium chloride infusion   Intravenous STAT [name], MD      . ondansetron [facility]) injection 4 mg  4 mg Intravenous Once [name], MD   4 mg at 07/21/11 0804  . ondansetron (ZOFRAN) injection 4 mg  4 mg Intravenous Q8H PRN [name], MD       Medications Prior to Admission  Medication Sig Dispense Refill  . acetaminophen (TYLENOL) 650 MG CR tablet Take 1,300 mg by mouth every 4 (four) hours as needed. For pain/fever      . alendronate (FOSAMAX) 70 MG tablet Take 70 mg by mouth every 7 (seven) days. Take with a full glass of water on an empty stomach on Fridays of each week      . amLODipine (NORVASC) 2.5 MG tablet Take 2.5 mg by mouth daily.      Marland Kitchen aspirin EC 81 MG tablet Take 81 mg by mouth daily.       . beta carotene w/minerals (OCUVITE) tablet Take 1 tablet by mouth 2 (two) times daily.      . cholecalciferol (VITAMIN D) 400 UNITS TABS Take 1 tablet (400 Units total) by mouth daily.  30 each    . cycloSPORINE (RESTASIS) 0.05 % ophthalmic emulsion Place 1 drop into both eyes 2 (two) times daily.       Marland Kitchen docusate sodium (COLACE) 100 MG capsule Take 100  mg by mouth at bedtime.      Marland Kitchen guaiFENesin-dextromethorphan (ROBITUSSIN DM) 100-10 MG/5ML syrup Take 10 mLs by mouth every 4 (four) hours as needed. For cough      . hydrALAZINE (APRESOLINE) 25 MG tablet Take 25 mg by mouth 2 (two) times daily.        Marland Kitchen lisinopril (PRINIVIL,ZESTRIL) 20 MG tablet Take 20 mg by mouth 2 (two) times daily.        . Magnesium 250 MG TABS Take 1 tablet by mouth daily.      . Multiple Vitamins-Calcium (ONE-A-DAY WOMENS FORMULA PO) Take 1 tablet by mouth daily.       . Omega-3 Fatty Acids (FISH OIL) 1000 MG CAPS Take 1 capsule by mouth 2 (two) times daily.        Marland Kitchen omeprazole (PRILOSEC) 20 MG capsule Take 20 mg by mouth daily.      . polyethylene glycol powder (GLYCOLAX/MIRALAX) powder Take 17 g by mouth daily.       . psyllium (HYDROCIL/METAMUCIL) 95 % PACK Take 1  packet by mouth daily.      . traMADol (ULTRAM) 50 MG tablet Take 50 mg by mouth 2 (two) times daily as needed. For pain      . triamcinolone cream (KENALOG) 0.5 % Apply 1 application topically 2 (two) times daily as needed. For rash      . ALPRAZolam (XANAX) 0.25 MG tablet Take 0.25 mg by mouth every 8 (eight) hours as needed. For anxiety      . fluticasone (FLONASE) 50 MCG/ACT nasal spray Place 2 sprays into the nose daily as needed. For allergies      . magnesium hydroxide (MILK OF MAGNESIA) 400 MG/5ML suspension Take 30 mLs by mouth daily as needed. For constipation           ZOX:WRUEA from the symptoms mentioned above,there are no other symptoms referable to all systems reviewed.  Physical Exam: Blood pressure 139/62, pulse 60, temperature 98.7 F (37.1 C), temperature source Oral, resp. rate 16, height 5\' 5"  (1.651 m), weight 67.586 kg (149 lb). She does look somewhat clinically dehydrated. However, she is alert and orientated. Heart sounds are present and normal. Lung fields are clear without any crackles, wheezes or bronchial breathing. Abdomen is soft and largely nontender. Bowel sounds are heard.    Basename 07/21/11 0750  WBC 3.2*  NEUTROABS 1.5*  HGB 10.6*  HCT 29.6*  MCV 93.1  PLT 189    Basename 07/21/11 0750  NA 121*  K 3.8  CL 86*  CO2 28  GLUCOSE 119*  BUN 14  CREATININE 0.98  CALCIUM 8.9  MG --         No results found. Impression: 1. Gastroenteritis, probably Norovirus. 2. Hypertension. 3. Hypothyroidism. 4. Status post recent pacemaker insertion in February 2013.     Plan: 1. Admit to general medical floor. 2. Intravenous fluids. 3. Antiemetics as required. I suspect that this will be a stay in the hospital as the natural history of this particular virus is relatively short.

## 2011-07-22 DIAGNOSIS — A088 Other specified intestinal infections: Secondary | ICD-10-CM | POA: Diagnosis not present

## 2011-07-22 DIAGNOSIS — E871 Hypo-osmolality and hyponatremia: Secondary | ICD-10-CM | POA: Diagnosis not present

## 2011-07-22 LAB — COMPREHENSIVE METABOLIC PANEL
AST: 21 U/L (ref 0–37)
Albumin: 2.9 g/dL — ABNORMAL LOW (ref 3.5–5.2)
BUN: 8 mg/dL (ref 6–23)
CO2: 27 mEq/L (ref 19–32)
Calcium: 8.3 mg/dL — ABNORMAL LOW (ref 8.4–10.5)
Creatinine, Ser: 1.03 mg/dL (ref 0.50–1.10)
Potassium: 4.6 mEq/L (ref 3.5–5.1)
Sodium: 132 mEq/L — ABNORMAL LOW (ref 135–145)
Total Bilirubin: 0.3 mg/dL (ref 0.3–1.2)
Total Protein: 5.7 g/dL — ABNORMAL LOW (ref 6.0–8.3)

## 2011-07-22 LAB — CBC
HCT: 27 % — ABNORMAL LOW (ref 36.0–46.0)
Hemoglobin: 9.3 g/dL — ABNORMAL LOW (ref 12.0–15.0)
MCH: 32.9 pg (ref 26.0–34.0)
MCV: 95.4 fL (ref 78.0–100.0)
RBC: 2.83 MIL/uL — ABNORMAL LOW (ref 3.87–5.11)
WBC: 3.2 10*3/uL — ABNORMAL LOW (ref 4.0–10.5)

## 2011-07-22 MED ORDER — PHENOL 1.4 % MT LIQD
1.0000 | OROMUCOSAL | Status: DC | PRN
  Administered 2011-07-22: 1 via OROMUCOSAL
  Filled 2011-07-22 (×2): qty 177

## 2011-07-22 MED ORDER — LEVOFLOXACIN 250 MG PO TABS
250.0000 mg | ORAL_TABLET | Freq: Every day | ORAL | Status: DC
  Administered 2011-07-22: 250 mg via ORAL
  Filled 2011-07-22: qty 1

## 2011-07-22 NOTE — Discharge Summary (Addendum)
Physician Discharge Summary  Patient ID: [name] MRN: [id number] DOB/AGE: 1923/04/14 78 y.o. Primary Care Physician:HALL,ZACK, MD, MD Admit date: 07/21/2011 Discharge date: 07/22/2011    Discharge Diagnoses:  1. Gastroenteritis, viral, improved. 2. Hyponatremia secondary to dehydration, improved. 3. Dehydration, resolved. 4. Hypertension. 5. Hypothyroidism.   Medication List  As of 07/22/2011 12:10 PM   TAKE these medications         acetaminophen 650 MG CR tablet   Commonly known as: TYLENOL   Take 1,300 mg by mouth every 4 (four) hours as needed. For pain/fever      acetaminophen 650 MG CR tablet   Commonly known as: TYLENOL   Take 650 mg by mouth every 8 (eight) hours as needed. For arthritis pain      alendronate 70 MG tablet   Commonly known as: FOSAMAX   Take 70 mg by mouth every 7 (seven) days. Take with a full glass of water on an empty stomach on Fridays of each week      ALIGN 4 MG Caps   Take 1 capsule by mouth daily.      ALPRAZolam 0.25 MG tablet   Commonly known as: XANAX   Take 0.25 mg by mouth every 8 (eight) hours as needed. For anxiety      amLODipine 2.5 MG tablet   Commonly known as: NORVASC   Take 2.5 mg by mouth daily.      aspirin EC 81 MG tablet   Take 81 mg by mouth daily.      benzonatate 100 MG capsule   Commonly known as: TESSALON   Take 100 mg by mouth 2 (two) times daily as needed. For cough      beta carotene w/minerals tablet   Take 1 tablet by mouth 2 (two) times daily.      cholecalciferol 400 UNITS Tabs   Commonly known as: VITAMIN D   Take 1 tablet (400 Units total) by mouth daily.      cycloSPORINE 0.05 % ophthalmic emulsion   Commonly known as: RESTASIS   Place 1 drop into both eyes 2 (two) times daily.      docusate sodium 100 MG capsule   Commonly known as: COLACE   Take 100 mg by mouth at bedtime.      Fish Oil 1000 MG Caps   Take 1 capsule by mouth 2 (two) times daily.      fluticasone 50 MCG/ACT nasal  spray   Commonly known as: FLONASE   Place 2 sprays into the nose daily as needed. For allergies      guaiFENesin 600 MG 12 hr tablet   Commonly known as: MUCINEX   Take 600 mg by mouth 2 (two) times daily.      guaiFENesin-dextromethorphan 100-10 MG/5ML syrup   Commonly known as: ROBITUSSIN DM   Take 10 mLs by mouth every 4 (four) hours as needed. For cough      hydrALAZINE 25 MG tablet   Commonly known as: APRESOLINE   Take 25 mg by mouth 2 (two) times daily.      HYDROcodone-homatropine 5-1.5 MG/5ML syrup   Commonly known as: HYCODAN   Take 5 mLs by mouth every 4 (four) hours as needed. For cough      ibuprofen 400 MG tablet   Commonly known as: ADVIL,MOTRIN   Take 400 mg by mouth every 6 (six) hours as needed. For pain/fever      levofloxacin 500 MG tablet   Commonly known as:  LEVAQUIN   Take 500 mg by mouth daily. For 10 days. Started 07/17/11, end 07/26/11      levothyroxine 100 MCG tablet   Commonly known as: SYNTHROID, LEVOTHROID   Take 100 mcg by mouth daily.      lisinopril 20 MG tablet   Commonly known as: PRINIVIL,ZESTRIL   Take 20 mg by mouth 2 (two) times daily.      lubiprostone 8 MCG capsule   Commonly known as: AMITIZA   Take 8 mcg by mouth 2 (two) times daily as needed. For IBS      Magnesium 250 MG Tabs   Take 1 tablet by mouth daily.      magnesium hydroxide 400 MG/5ML suspension   Commonly known as: MILK OF MAGNESIA   Take 30 mLs by mouth daily as needed. For constipation      omeprazole 20 MG capsule   Commonly known as: PRILOSEC   Take 20 mg by mouth daily.      ONE-A-DAY WOMENS FORMULA PO   Take 1 tablet by mouth daily.         polyethylene glycol powder powder   Commonly known as: GLYCOLAX/MIRALAX   Take 17 g by mouth daily.      psyllium 95 % Pack   Commonly known as: HYDROCIL/METAMUCIL   Take 1 packet by mouth daily.      traMADol 50 MG tablet   Commonly known as: ULTRAM   Take 50 mg by mouth 2 (two) times daily as needed. For  pain      triamcinolone cream 0.5 %   Commonly known as: KENALOG   Apply 1 application topically 2 (two) times daily as needed. For rash            Discharged Condition: Stable and improved.    Consults: None.  Significant Diagnostic Studies: No results found.  Lab Results: Basic Metabolic Panel:  Basename 07/22/11 0638 07/21/11 0750  NA 132* 121*  K 4.6 3.8  CL 99 86*  CO2 27 28  GLUCOSE 93 119*  BUN 8 14  CREATININE 1.03 0.98  CALCIUM 8.3* 8.9  MG -- --  PHOS -- --   Liver Function Tests:  [facility] 07/22/11 0638 07/21/11 0750  AST 21 25  ALT 13 16  ALKPHOS 54 66  BILITOT 0.3 0.4  PROT 5.7* 6.5  ALBUMIN 2.9* 3.4*     CBC:  Basename 07/22/11 0638 07/21/11 0750  WBC 3.2* 3.2*  NEUTROABS -- 1.5*  HGB 9.3* 10.6*  HCT 27.0* 29.6*  MCV 95.4 93.1  PLT 166 189       Hospital Course: This very pleasant 78 year old lady was admitted yesterday with symptoms of nausea and vomiting. She did not have any significant diarrhea. She lives at [facility], where there has been an epidemic of Noro- virus. On arrival to the emergency room, she was hyponatremic and clinically dehydrated. Therefore, she was admitted, given IV fluids and she made a significant improvement overnight. She was able to tolerate a diet as it was advanced this morning without vomiting. Her abdomen feels better. She's had 2 formed stools. She has not had a fever.  Discharge Exam: Blood pressure 141/69, pulse 60, temperature 98 F (36.7 C), temperature source Oral, resp. rate 18, height 5\' 5"  (1.651 m), weight 67.586 kg (149 lb), SpO2 95.00%. She looks systemically well. She is clinically dehydrated. Her abdomen is soft and nontender. Heart sounds are present and normal. Lung fields are clear. She is alert and  orientated without any focal neurological signs.  Disposition: [employer] [facility]. She has been encouraged to continue good oral intake, especially fluids.  Discharge Orders    Future  Appointments: Provider: Department: Dept Phone: Center:   09/03/2011 11:00 AM Marinus Maw, MD [facility] [phone] [id number]     Future Orders Please Complete By Expires   Diet - low sodium heart healthy      Increase activity slowly         Follow-up Information    Follow up with [facility], MD .         SignedWilson Singer Pager [phone]  07/22/2011, 12:10 PM

## 2011-07-22 NOTE — Progress Notes (Signed)
Clinical Social Work Department CLINICAL SOCIAL WORK PLACEMENT NOTE 07/22/2011  Patient:  [name], [name]  Account Number:  [id number] Admit date:  07/21/2011  Clinical Social Worker:  Peggyann Shoals  Date/time:  07/22/2011 10:30 AM  Clinical Social Work is seeking post-discharge placement for this patient at the following level of care:   ASSISTED LIVING/REST HOME   (*CSW will update this form in Epic as items are completed)     Patient/family provided with [facility] Department of Clinical Social Work's list of facilities offering this level of care within the geographic area requested by the patient (or if unable, by the patient's family).    Patient/family informed of their freedom to choose among providers that offer the needed level of care, that participate in Medicare, Medicaid or managed care program needed by the patient, have an available bed and are willing to accept the patient.    Patient/family informed of [facility]' ownership interest in [facility], as well as of the fact that they are under no obligation to receive care at this facility.  PASARR submitted to EDS on  PASARR number received from EDS on   FL2 transmitted to all facilities in geographic area requested by pt/family on  07/22/2011 FL2 transmitted to all facilities within larger geographic area on   Patient informed that his/her managed care company has contracts with or will negotiate with  certain facilities, including the following:     Patient/family informed of bed offers received:  07/22/2011 Patient chooses bed at [facility] [place] Physician recommends and patient chooses bed at  [facility] [place]  Patient to be transferred to [facility] [place] on   Patient to be transferred to facility by [name], Ashia Dehner  The following physician request were entered in Epic:   Additional Comments: Pt was admitted from [facility]. Pt will return  at discharge. CSW spoke with pt's tech at [facility], who shared that pt is able to return at discharge. It is assumed that pt has a PASARR # as she was admitted from a facilty, however PASARR # is not confirmed.   Dede Query, MSW, [facility] Weekend Coverage [phone]

## 2011-07-22 NOTE — Progress Notes (Signed)
Subjective: This lady feels better. She is not as nauseous as she was when she came in yesterday. She has not really had any diarrhea. However, she is only had clear fluid diet which she has tolerated. He complains of a sore throat.           Physical Exam: Blood pressure 141/69, pulse 60, temperature 98 F (36.7 C), temperature source Oral, resp. rate 18, height 5\' 5"  (1.651 m), weight 67.586 kg (149 lb), SpO2 95.00%. She looks systemically well. Her throat she does not look inflamed so I suspect and inflammation is lower down. Abdomen is soft and nontender. Lung fields are clear. She is alert and orientated.   Investigations:     Basic Metabolic Panel:  Basename 07/21/11 0750  NA 121*  K 3.8  CL 86*  CO2 28  GLUCOSE 119*  BUN 14  CREATININE 0.98  CALCIUM 8.9  MG --  PHOS --   Liver Function Tests:  [facility] 07/21/11 0750  AST 25  ALT 16  ALKPHOS 66  BILITOT 0.4  PROT 6.5  ALBUMIN 3.4*     CBC:  Basename 07/22/11 0638 07/21/11 0750  WBC 3.2* 3.2*  NEUTROABS -- 1.5*  HGB 9.3* 10.6*  HCT 27.0* 29.6*  MCV 95.4 93.1  PLT 166 189        Medications: I have reviewed the patient's current medications.  Impression: 1. Gastroenteritis, clinically resolving. 2. Hypertension. 3. Hypothyroidism.     Plan: 1. Advance diet as tolerated. 2. Await electrolytes from this morning's laboratory. If she is doing well, she could possibly be discharged back to [facility] today, if they will accept her on Sunday.     LOS: 1 day   Wilson Singer Pager [phone]  07/22/2011, 7:39 AM

## 2011-07-22 NOTE — Progress Notes (Addendum)
Pt discharged to [facility]. Both pt and pt's son were agreeable to discharge to facility. Facility received discharge summary and FL2 and was ready to accept pt. [name] provided transportation to facility. CSW signing off as no further intervention is needed.   Dede Query, MSW, [facility] Weekend Coverage [phone]

## 2011-07-22 NOTE — Progress Notes (Signed)
Clinical Social Work Department BRIEF PSYCHOSOCIAL ASSESSMENT 07/22/2011  Patient:  [name], [name]     Account Number:  [id number]     Admit date:  07/21/2011  Clinical Social Worker:  Peggyann Shoals  Date/Time:  07/22/2011 10:30 AM  Referred by:  RN  Date Referred:  07/22/2011 Referred for  ALF Placement   Other Referral:   Pt was admitted from ALF   Interview type:  Patient Other interview type:    PSYCHOSOCIAL DATA Living Status:  FACILITY Admitted from facility:  [facility] Level of care:  Assisted Living Primary support name:  Nizhoni Parlow Primary support relationship to patient:  CHILD, ADULT Degree of support available:   [name] is supportive. Home:  [phone], Cell: [phone]. Pt also has her daughter in law and grandson who live near by.    CURRENT CONCERNS Current Concerns  Post-Acute Placement   Other Concerns:    SOCIAL WORK ASSESSMENT / PLAN CSW met with pt to address consult. CSW explained the role of social work. Pt reported that she has been living at [facility] since October of 2012. Pt reported that she is agreeable to returning when she is discharged.    Pt reported that her son, Fayrene Fearing, and her daughter in law, are both supportive. Pt shared that they take her to appointment and out to eat.   Assessment/plan status:  Psychosocial Support/Ongoing Assessment of Needs Other assessment/ plan:   CSW contacted [facility] and they are able to accept pt when she is discharged. CSW will complete FL2.    CSW will continue to follow to facilitate discharge to [facility]   Information/referral to community resources:   As needed.    PATIENT'S/FAMILY'S RESPONSE TO PLAN OF CARE: Pt was alert and oriented. Pt was very pleasant. Pt is agreeable to returning to [facility] when discharged. Pt reported that pt's son will provide transportation to facility at discharge.    Dede Query, MSW, [facility] Weekend  Coverage [phone]

## 2011-07-22 NOTE — Progress Notes (Signed)
Pt. Discharge information given to patient and family to be given to [facility]. Taken by wheelchair to personal vehicle.

## 2011-07-30 DIAGNOSIS — M47814 Spondylosis without myelopathy or radiculopathy, thoracic region: Secondary | ICD-10-CM | POA: Diagnosis not present

## 2011-07-30 DIAGNOSIS — M999 Biomechanical lesion, unspecified: Secondary | ICD-10-CM | POA: Diagnosis not present

## 2011-08-02 ENCOUNTER — Other Ambulatory Visit (HOSPITAL_COMMUNITY): Payer: Self-pay | Admitting: Internal Medicine

## 2011-08-02 ENCOUNTER — Ambulatory Visit (HOSPITAL_COMMUNITY)
Admission: RE | Admit: 2011-08-02 | Discharge: 2011-08-02 | Disposition: A | Discharge status: Home / Self Care | Payer: Medicare Other | Source: Ambulatory Visit | Attending: Internal Medicine | Admitting: Internal Medicine

## 2011-08-02 DIAGNOSIS — E039 Hypothyroidism, unspecified: Secondary | ICD-10-CM | POA: Diagnosis not present

## 2011-08-02 DIAGNOSIS — J069 Acute upper respiratory infection, unspecified: Secondary | ICD-10-CM | POA: Diagnosis not present

## 2011-08-02 DIAGNOSIS — M546 Pain in thoracic spine: Secondary | ICD-10-CM | POA: Diagnosis not present

## 2011-08-02 DIAGNOSIS — R079 Chest pain, unspecified: Secondary | ICD-10-CM | POA: Insufficient documentation

## 2011-08-02 DIAGNOSIS — I517 Cardiomegaly: Secondary | ICD-10-CM | POA: Diagnosis not present

## 2011-08-02 DIAGNOSIS — M549 Dorsalgia, unspecified: Secondary | ICD-10-CM | POA: Diagnosis not present

## 2011-08-02 DIAGNOSIS — M47814 Spondylosis without myelopathy or radiculopathy, thoracic region: Secondary | ICD-10-CM | POA: Diagnosis not present

## 2011-08-07 DIAGNOSIS — M6281 Muscle weakness (generalized): Secondary | ICD-10-CM | POA: Diagnosis not present

## 2011-08-07 DIAGNOSIS — H3530 Unspecified macular degeneration: Secondary | ICD-10-CM | POA: Diagnosis not present

## 2011-08-07 DIAGNOSIS — M810 Age-related osteoporosis without current pathological fracture: Secondary | ICD-10-CM | POA: Diagnosis not present

## 2011-08-07 DIAGNOSIS — M8448XD Pathological fracture, other site, subsequent encounter for fracture with routine healing: Secondary | ICD-10-CM | POA: Diagnosis not present

## 2011-08-07 DIAGNOSIS — D649 Anemia, unspecified: Secondary | ICD-10-CM | POA: Diagnosis not present

## 2011-08-07 DIAGNOSIS — Z950 Presence of cardiac pacemaker: Secondary | ICD-10-CM | POA: Diagnosis not present

## 2011-08-07 DIAGNOSIS — I10 Essential (primary) hypertension: Secondary | ICD-10-CM | POA: Diagnosis not present

## 2011-08-07 DIAGNOSIS — H543 Unqualified visual loss, both eyes: Secondary | ICD-10-CM | POA: Diagnosis not present

## 2011-08-07 DIAGNOSIS — M546 Pain in thoracic spine: Secondary | ICD-10-CM | POA: Diagnosis not present

## 2011-08-08 DIAGNOSIS — M546 Pain in thoracic spine: Secondary | ICD-10-CM | POA: Diagnosis not present

## 2011-08-08 DIAGNOSIS — M8448XD Pathological fracture, other site, subsequent encounter for fracture with routine healing: Secondary | ICD-10-CM | POA: Diagnosis not present

## 2011-08-08 DIAGNOSIS — M810 Age-related osteoporosis without current pathological fracture: Secondary | ICD-10-CM | POA: Diagnosis not present

## 2011-08-08 DIAGNOSIS — H543 Unqualified visual loss, both eyes: Secondary | ICD-10-CM | POA: Diagnosis not present

## 2011-08-08 DIAGNOSIS — D649 Anemia, unspecified: Secondary | ICD-10-CM | POA: Diagnosis not present

## 2011-08-08 DIAGNOSIS — I10 Essential (primary) hypertension: Secondary | ICD-10-CM | POA: Diagnosis not present

## 2011-08-09 DIAGNOSIS — H543 Unqualified visual loss, both eyes: Secondary | ICD-10-CM | POA: Diagnosis not present

## 2011-08-09 DIAGNOSIS — M810 Age-related osteoporosis without current pathological fracture: Secondary | ICD-10-CM | POA: Diagnosis not present

## 2011-08-09 DIAGNOSIS — D649 Anemia, unspecified: Secondary | ICD-10-CM | POA: Diagnosis not present

## 2011-08-09 DIAGNOSIS — I10 Essential (primary) hypertension: Secondary | ICD-10-CM | POA: Diagnosis not present

## 2011-08-09 DIAGNOSIS — M546 Pain in thoracic spine: Secondary | ICD-10-CM | POA: Diagnosis not present

## 2011-08-09 DIAGNOSIS — M8448XD Pathological fracture, other site, subsequent encounter for fracture with routine healing: Secondary | ICD-10-CM | POA: Diagnosis not present

## 2011-08-10 DIAGNOSIS — M8448XD Pathological fracture, other site, subsequent encounter for fracture with routine healing: Secondary | ICD-10-CM | POA: Diagnosis not present

## 2011-08-10 DIAGNOSIS — I10 Essential (primary) hypertension: Secondary | ICD-10-CM | POA: Diagnosis not present

## 2011-08-10 DIAGNOSIS — D649 Anemia, unspecified: Secondary | ICD-10-CM | POA: Diagnosis not present

## 2011-08-10 DIAGNOSIS — H543 Unqualified visual loss, both eyes: Secondary | ICD-10-CM | POA: Diagnosis not present

## 2011-08-10 DIAGNOSIS — M546 Pain in thoracic spine: Secondary | ICD-10-CM | POA: Diagnosis not present

## 2011-08-10 DIAGNOSIS — M810 Age-related osteoporosis without current pathological fracture: Secondary | ICD-10-CM | POA: Diagnosis not present

## 2011-08-13 ENCOUNTER — Other Ambulatory Visit (HOSPITAL_COMMUNITY): Payer: Self-pay | Admitting: Internal Medicine

## 2011-08-13 DIAGNOSIS — IMO0002 Reserved for concepts with insufficient information to code with codable children: Secondary | ICD-10-CM

## 2011-08-13 DIAGNOSIS — M546 Pain in thoracic spine: Secondary | ICD-10-CM | POA: Diagnosis not present

## 2011-08-13 DIAGNOSIS — M8448XD Pathological fracture, other site, subsequent encounter for fracture with routine healing: Secondary | ICD-10-CM | POA: Diagnosis not present

## 2011-08-13 DIAGNOSIS — H543 Unqualified visual loss, both eyes: Secondary | ICD-10-CM | POA: Diagnosis not present

## 2011-08-13 DIAGNOSIS — M810 Age-related osteoporosis without current pathological fracture: Secondary | ICD-10-CM | POA: Diagnosis not present

## 2011-08-13 DIAGNOSIS — I10 Essential (primary) hypertension: Secondary | ICD-10-CM | POA: Diagnosis not present

## 2011-08-13 DIAGNOSIS — D649 Anemia, unspecified: Secondary | ICD-10-CM | POA: Diagnosis not present

## 2011-08-13 DIAGNOSIS — T148XXA Other injury of unspecified body region, initial encounter: Secondary | ICD-10-CM

## 2011-08-15 DIAGNOSIS — M810 Age-related osteoporosis without current pathological fracture: Secondary | ICD-10-CM | POA: Diagnosis not present

## 2011-08-15 DIAGNOSIS — M546 Pain in thoracic spine: Secondary | ICD-10-CM | POA: Diagnosis not present

## 2011-08-15 DIAGNOSIS — M8448XD Pathological fracture, other site, subsequent encounter for fracture with routine healing: Secondary | ICD-10-CM | POA: Diagnosis not present

## 2011-08-15 DIAGNOSIS — H543 Unqualified visual loss, both eyes: Secondary | ICD-10-CM | POA: Diagnosis not present

## 2011-08-15 DIAGNOSIS — I10 Essential (primary) hypertension: Secondary | ICD-10-CM | POA: Diagnosis not present

## 2011-08-15 DIAGNOSIS — D649 Anemia, unspecified: Secondary | ICD-10-CM | POA: Diagnosis not present

## 2011-08-16 ENCOUNTER — Other Ambulatory Visit (HOSPITAL_COMMUNITY): Payer: Medicare Other

## 2011-08-16 ENCOUNTER — Ambulatory Visit (HOSPITAL_COMMUNITY)
Admission: RE | Admit: 2011-08-16 | Discharge: 2011-08-16 | Disposition: A | Discharge status: Home / Self Care | Payer: Medicare Other | Source: Ambulatory Visit | Attending: Internal Medicine | Admitting: Internal Medicine

## 2011-08-16 DIAGNOSIS — T148XXA Other injury of unspecified body region, initial encounter: Secondary | ICD-10-CM

## 2011-08-16 DIAGNOSIS — M546 Pain in thoracic spine: Secondary | ICD-10-CM | POA: Insufficient documentation

## 2011-08-16 DIAGNOSIS — M4000 Postural kyphosis, site unspecified: Secondary | ICD-10-CM | POA: Diagnosis not present

## 2011-08-16 DIAGNOSIS — I10 Essential (primary) hypertension: Secondary | ICD-10-CM | POA: Diagnosis not present

## 2011-08-16 DIAGNOSIS — J9819 Other pulmonary collapse: Secondary | ICD-10-CM | POA: Diagnosis not present

## 2011-08-16 DIAGNOSIS — M810 Age-related osteoporosis without current pathological fracture: Secondary | ICD-10-CM | POA: Diagnosis not present

## 2011-08-16 DIAGNOSIS — D649 Anemia, unspecified: Secondary | ICD-10-CM | POA: Diagnosis not present

## 2011-08-16 DIAGNOSIS — M8448XD Pathological fracture, other site, subsequent encounter for fracture with routine healing: Secondary | ICD-10-CM | POA: Diagnosis not present

## 2011-08-16 DIAGNOSIS — S22009A Unspecified fracture of unspecified thoracic vertebra, initial encounter for closed fracture: Secondary | ICD-10-CM | POA: Insufficient documentation

## 2011-08-16 DIAGNOSIS — H543 Unqualified visual loss, both eyes: Secondary | ICD-10-CM | POA: Diagnosis not present

## 2011-08-16 DIAGNOSIS — X58XXXA Exposure to other specified factors, initial encounter: Secondary | ICD-10-CM | POA: Insufficient documentation

## 2011-08-17 ENCOUNTER — Other Ambulatory Visit (HOSPITAL_COMMUNITY): Payer: Self-pay | Admitting: Internal Medicine

## 2011-08-17 DIAGNOSIS — R1310 Dysphagia, unspecified: Secondary | ICD-10-CM

## 2011-08-20 DIAGNOSIS — M546 Pain in thoracic spine: Secondary | ICD-10-CM | POA: Diagnosis not present

## 2011-08-20 DIAGNOSIS — M8448XD Pathological fracture, other site, subsequent encounter for fracture with routine healing: Secondary | ICD-10-CM | POA: Diagnosis not present

## 2011-08-20 DIAGNOSIS — H35359 Cystoid macular degeneration, unspecified eye: Secondary | ICD-10-CM | POA: Diagnosis not present

## 2011-08-20 DIAGNOSIS — H35439 Paving stone degeneration of retina, unspecified eye: Secondary | ICD-10-CM | POA: Diagnosis not present

## 2011-08-20 DIAGNOSIS — H35369 Drusen (degenerative) of macula, unspecified eye: Secondary | ICD-10-CM | POA: Diagnosis not present

## 2011-08-20 DIAGNOSIS — D649 Anemia, unspecified: Secondary | ICD-10-CM | POA: Diagnosis not present

## 2011-08-20 DIAGNOSIS — H353290 Exudative age-related macular degeneration, unspecified eye, stage unspecified: Secondary | ICD-10-CM | POA: Diagnosis not present

## 2011-08-20 DIAGNOSIS — H543 Unqualified visual loss, both eyes: Secondary | ICD-10-CM | POA: Diagnosis not present

## 2011-08-20 DIAGNOSIS — M810 Age-related osteoporosis without current pathological fracture: Secondary | ICD-10-CM | POA: Diagnosis not present

## 2011-08-20 DIAGNOSIS — I10 Essential (primary) hypertension: Secondary | ICD-10-CM | POA: Diagnosis not present

## 2011-08-20 DIAGNOSIS — H35059 Retinal neovascularization, unspecified, unspecified eye: Secondary | ICD-10-CM | POA: Diagnosis not present

## 2011-08-21 ENCOUNTER — Other Ambulatory Visit (HOSPITAL_COMMUNITY): Payer: Medicare Other

## 2011-08-22 DIAGNOSIS — I10 Essential (primary) hypertension: Secondary | ICD-10-CM | POA: Diagnosis not present

## 2011-08-22 DIAGNOSIS — H543 Unqualified visual loss, both eyes: Secondary | ICD-10-CM | POA: Diagnosis not present

## 2011-08-22 DIAGNOSIS — M8448XD Pathological fracture, other site, subsequent encounter for fracture with routine healing: Secondary | ICD-10-CM | POA: Diagnosis not present

## 2011-08-22 DIAGNOSIS — M546 Pain in thoracic spine: Secondary | ICD-10-CM | POA: Diagnosis not present

## 2011-08-22 DIAGNOSIS — D649 Anemia, unspecified: Secondary | ICD-10-CM | POA: Diagnosis not present

## 2011-08-22 DIAGNOSIS — M810 Age-related osteoporosis without current pathological fracture: Secondary | ICD-10-CM | POA: Diagnosis not present

## 2011-08-23 ENCOUNTER — Ambulatory Visit (HOSPITAL_COMMUNITY)
Admission: RE | Admit: 2011-08-23 | Discharge: 2011-08-23 | Disposition: A | Discharge status: Home / Self Care | Payer: Medicare Other | Source: Ambulatory Visit | Attending: Internal Medicine | Admitting: Internal Medicine

## 2011-08-23 ENCOUNTER — Other Ambulatory Visit (HOSPITAL_COMMUNITY): Payer: Self-pay | Admitting: Internal Medicine

## 2011-08-23 DIAGNOSIS — K228 Other specified diseases of esophagus: Secondary | ICD-10-CM | POA: Diagnosis not present

## 2011-08-23 DIAGNOSIS — R070 Pain in throat: Secondary | ICD-10-CM | POA: Diagnosis not present

## 2011-08-23 DIAGNOSIS — K224 Dyskinesia of esophagus: Secondary | ICD-10-CM | POA: Diagnosis not present

## 2011-08-23 DIAGNOSIS — R1310 Dysphagia, unspecified: Secondary | ICD-10-CM

## 2011-08-23 DIAGNOSIS — IMO0002 Reserved for concepts with insufficient information to code with codable children: Secondary | ICD-10-CM

## 2011-08-23 DIAGNOSIS — R192 Visible peristalsis: Secondary | ICD-10-CM | POA: Insufficient documentation

## 2011-08-24 ENCOUNTER — Ambulatory Visit (HOSPITAL_COMMUNITY): Admission: RE | Admit: 2011-08-24 | Payer: Medicare Other | Source: Ambulatory Visit

## 2011-08-24 DIAGNOSIS — M810 Age-related osteoporosis without current pathological fracture: Secondary | ICD-10-CM | POA: Diagnosis not present

## 2011-08-24 DIAGNOSIS — I10 Essential (primary) hypertension: Secondary | ICD-10-CM | POA: Diagnosis not present

## 2011-08-24 DIAGNOSIS — M546 Pain in thoracic spine: Secondary | ICD-10-CM | POA: Diagnosis not present

## 2011-08-24 DIAGNOSIS — H543 Unqualified visual loss, both eyes: Secondary | ICD-10-CM | POA: Diagnosis not present

## 2011-08-24 DIAGNOSIS — D649 Anemia, unspecified: Secondary | ICD-10-CM | POA: Diagnosis not present

## 2011-08-24 DIAGNOSIS — M8448XD Pathological fracture, other site, subsequent encounter for fracture with routine healing: Secondary | ICD-10-CM | POA: Diagnosis not present

## 2011-08-27 DIAGNOSIS — H353290 Exudative age-related macular degeneration, unspecified eye, stage unspecified: Secondary | ICD-10-CM | POA: Diagnosis not present

## 2011-08-27 DIAGNOSIS — H35359 Cystoid macular degeneration, unspecified eye: Secondary | ICD-10-CM | POA: Diagnosis not present

## 2011-08-27 DIAGNOSIS — H35059 Retinal neovascularization, unspecified, unspecified eye: Secondary | ICD-10-CM | POA: Diagnosis not present

## 2011-08-28 DIAGNOSIS — E039 Hypothyroidism, unspecified: Secondary | ICD-10-CM | POA: Diagnosis not present

## 2011-08-28 DIAGNOSIS — M949 Disorder of cartilage, unspecified: Secondary | ICD-10-CM | POA: Diagnosis not present

## 2011-08-28 DIAGNOSIS — E871 Hypo-osmolality and hyponatremia: Secondary | ICD-10-CM | POA: Diagnosis not present

## 2011-08-28 DIAGNOSIS — I10 Essential (primary) hypertension: Secondary | ICD-10-CM | POA: Diagnosis not present

## 2011-08-28 DIAGNOSIS — IMO0002 Reserved for concepts with insufficient information to code with codable children: Secondary | ICD-10-CM | POA: Diagnosis not present

## 2011-08-28 DIAGNOSIS — K224 Dyskinesia of esophagus: Secondary | ICD-10-CM | POA: Diagnosis not present

## 2011-08-29 DIAGNOSIS — M8448XD Pathological fracture, other site, subsequent encounter for fracture with routine healing: Secondary | ICD-10-CM | POA: Diagnosis not present

## 2011-08-29 DIAGNOSIS — D649 Anemia, unspecified: Secondary | ICD-10-CM | POA: Diagnosis not present

## 2011-08-29 DIAGNOSIS — I10 Essential (primary) hypertension: Secondary | ICD-10-CM | POA: Diagnosis not present

## 2011-08-29 DIAGNOSIS — H543 Unqualified visual loss, both eyes: Secondary | ICD-10-CM | POA: Diagnosis not present

## 2011-08-29 DIAGNOSIS — M546 Pain in thoracic spine: Secondary | ICD-10-CM | POA: Diagnosis not present

## 2011-08-29 DIAGNOSIS — M810 Age-related osteoporosis without current pathological fracture: Secondary | ICD-10-CM | POA: Diagnosis not present

## 2011-08-30 ENCOUNTER — Other Ambulatory Visit (HOSPITAL_COMMUNITY): Payer: Self-pay | Admitting: Interventional Radiology

## 2011-08-30 ENCOUNTER — Encounter (INDEPENDENT_AMBULATORY_CARE_PROVIDER_SITE_OTHER): Payer: Self-pay | Admitting: *Deleted

## 2011-08-30 ENCOUNTER — Ambulatory Visit (HOSPITAL_COMMUNITY)
Admission: RE | Admit: 2011-08-30 | Discharge: 2011-08-30 | Disposition: A | Discharge status: Home / Self Care | Payer: Medicare Other | Source: Ambulatory Visit | Attending: Internal Medicine | Admitting: Internal Medicine

## 2011-08-30 DIAGNOSIS — IMO0002 Reserved for concepts with insufficient information to code with codable children: Secondary | ICD-10-CM

## 2011-08-30 DIAGNOSIS — M4000 Postural kyphosis, site unspecified: Secondary | ICD-10-CM | POA: Diagnosis not present

## 2011-08-30 DIAGNOSIS — S22009A Unspecified fracture of unspecified thoracic vertebra, initial encounter for closed fracture: Secondary | ICD-10-CM | POA: Diagnosis not present

## 2011-08-30 DIAGNOSIS — G35 Multiple sclerosis: Secondary | ICD-10-CM | POA: Diagnosis not present

## 2011-08-31 ENCOUNTER — Other Ambulatory Visit: Payer: Self-pay | Admitting: Radiology

## 2011-08-31 ENCOUNTER — Encounter (HOSPITAL_COMMUNITY): Payer: Self-pay | Admitting: Pharmacy Technician

## 2011-09-03 ENCOUNTER — Ambulatory Visit (INDEPENDENT_AMBULATORY_CARE_PROVIDER_SITE_OTHER): Payer: Medicare Other | Admitting: Internal Medicine

## 2011-09-03 ENCOUNTER — Encounter: Payer: Self-pay | Admitting: Internal Medicine

## 2011-09-03 VITALS — BP 138/76 | HR 69 | Resp 18 | Ht 65.0 in | Wt 144.0 lb

## 2011-09-03 DIAGNOSIS — I10 Essential (primary) hypertension: Secondary | ICD-10-CM | POA: Diagnosis not present

## 2011-09-03 DIAGNOSIS — I498 Other specified cardiac arrhythmias: Secondary | ICD-10-CM

## 2011-09-03 DIAGNOSIS — Z950 Presence of cardiac pacemaker: Secondary | ICD-10-CM | POA: Diagnosis not present

## 2011-09-03 DIAGNOSIS — R001 Bradycardia, unspecified: Secondary | ICD-10-CM

## 2011-09-03 LAB — PACEMAKER DEVICE OBSERVATION
AL AMPLITUDE: 4.5 mv
BATTERY VOLTAGE: 2.9629 V
DEVICE MODEL PM: 7316836
RV LEAD IMPEDENCE PM: 512.5 Ohm
VENTRICULAR PACING PM: 1

## 2011-09-03 NOTE — Progress Notes (Signed)
Addended by: Lisabeth Devoid F on: 09/03/2011 01:33 PM   Modules accepted: Orders

## 2011-09-03 NOTE — Patient Instructions (Signed)
**Note De-Identified  Obfuscation** Your physician recommends that you schedule a follow-up appointment in: February

## 2011-09-03 NOTE — Assessment & Plan Note (Signed)
Her pressure appears to be well-controlled. She will continue her current medical therapy and maintain a low-sodium diet.

## 2011-09-03 NOTE — Progress Notes (Signed)
HPI [name] returns today for followup. She is a very pleasant older woman with a history of symptomatic bradycardia, status post permanent pacemaker insertion. Recently she has developed severe back pain after coughing. Subsequent x-ray demonstrated a spinal fracture. She is now pending vertebral plasty. She denies palpitations, shortness of breath, or peripheral edema. No chest pain. No syncope. Allergies  Allergen Reactions  . Sulfa Antibiotics Nausea And Vomiting     Current Outpatient Prescriptions  Medication Sig Dispense Refill  . acetaminophen (TYLENOL) 650 MG CR tablet Take 1,300 mg by mouth every 4 (four) hours as needed. For pain/fever      . acetaminophen (TYLENOL) 650 MG CR tablet Take 650 mg by mouth every 8 (eight) hours as needed. For arthritis pain      . alendronate (FOSAMAX) 70 MG tablet Take 70 mg by mouth every 7 (seven) days. Take with a full glass of water on an empty stomach on Fridays of each week      . ALPRAZolam (XANAX) 0.25 MG tablet Take 0.25 mg by mouth every 8 (eight) hours as needed. For anxiety      . amLODipine (NORVASC) 2.5 MG tablet Take 2.5 mg by mouth daily.      . beta carotene w/minerals (OCUVITE) tablet Take 1 tablet by mouth 2 (two) times daily.      . cycloSPORINE (RESTASIS) 0.05 % ophthalmic emulsion Place 1 drop into both eyes 2 (two) times daily.       Marland Kitchen docusate sodium (COLACE) 100 MG capsule Take 100 mg by mouth at bedtime.      . fluticasone (FLONASE) 50 MCG/ACT nasal spray Place 2 sprays into the nose daily as needed. For allergies      . hydrALAZINE (APRESOLINE) 25 MG tablet Take 25 mg by mouth 2 (two) times daily.        Marland Kitchen HYDROcodone-homatropine (HYCODAN) 5-1.5 MG/5ML syrup Take 5 mLs by mouth every 4 (four) hours as needed. For cough      . levothyroxine (SYNTHROID, LEVOTHROID) 100 MCG tablet Take 100 mcg by mouth daily.      Marland Kitchen lisinopril (PRINIVIL,ZESTRIL) 20 MG tablet Take 20 mg by mouth 2 (two) times daily.        . Magnesium 250 MG  TABS Take 1 tablet by mouth daily.      . magnesium hydroxide (MILK OF MAGNESIA) 400 MG/5ML suspension Take 30 mLs by mouth daily as needed. For constipation      . omeprazole (PRILOSEC) 20 MG capsule Take 20 mg by mouth daily.      . polyethylene glycol powder (GLYCOLAX/MIRALAX) powder Take 17 g by mouth daily.       . promethazine (PHENERGAN) 25 MG tablet Take 25 mg by mouth every 6 (six) hours as needed. For nausea      . psyllium (HYDROCIL/METAMUCIL) 95 % PACK Take 1 packet by mouth daily.      . traMADol (ULTRAM) 50 MG tablet Take 50 mg by mouth 2 (two) times daily as needed. For pain      . triamcinolone cream (KENALOG) 0.5 % Apply 1 application topically 2 (two) times daily as needed. For rash         Past Medical History  Diagnosis Date  . Hypertension   . Hypothyroidism   . Bradycardia   . Anemia   . Diverticulosis   . External hemorrhoids   . Constipation   . Macular degeneration   . Seasonal allergies   . Osteoporosis   .  Degenerative joint disease   . Palpitations   . Hyperlipidemia     ROS:   All systems reviewed and negative except as noted in the HPI.   Past Surgical History  Procedure Date  . Thyroidectomy   . Tonsillectomy   . Colonoscopy November 2011    External hemorrhoids, diverticulosis, anal papillae  . Insert / replace / remove pacemaker      Family History  Problem Relation Age of Onset  . Heart attack Mother     Deceased  . Cancer Father     Deceased  . Cancer Brother     Deceased     History   Social History  . Marital Status: Widowed    Spouse Name: N/A    Number of Children: N/A  . Years of Education: N/A   Occupational History  . Not on file.   Social History Main Topics  . Smoking status: Never Smoker   . Smokeless tobacco: Not on file  . Alcohol Use: No  . Drug Use: No  . Sexually Active: No   Other Topics Concern  . Not on file   Social History Narrative  . No narrative on file     BP 138/76  Pulse 69   Resp 18  Ht 5\' 5"  (1.651 m)  Wt 144 lb (65.318 kg)  BMI 23.96 kg/m2  Physical Exam:  Well appearing elderly woman, NAD HEENT: Unremarkable Neck:  No JVD, no thyromegally Lungs:  Clear with no wheezes, rales, or rhonchi. Well-healed pacemaker incision. HEART:  Regular rate rhythm, no murmurs, no rubs, no clicks Abd:  soft, positive bowel sounds, no organomegally, no rebound, no guarding Ext:  2 plus pulses, no edema, no cyanosis, no clubbing Skin:  No rashes no nodules Neuro:  CN II through XII intact, motor grossly intact DEVICE  Normal device function.  See PaceArt for details.   Assess/Plan:

## 2011-09-03 NOTE — Assessment & Plan Note (Signed)
Her device is working normally. We'll plan to recheck in several months. 

## 2011-09-05 ENCOUNTER — Ambulatory Visit (HOSPITAL_COMMUNITY)
Admission: RE | Admit: 2011-09-05 | Discharge: 2011-09-05 | Disposition: A | Discharge status: Home / Self Care | Payer: Medicare Other | Source: Ambulatory Visit | Attending: Interventional Radiology | Admitting: Interventional Radiology

## 2011-09-05 ENCOUNTER — Encounter (HOSPITAL_COMMUNITY): Payer: Self-pay

## 2011-09-05 ENCOUNTER — Encounter (HOSPITAL_COMMUNITY)
Admission: RE | Admit: 2011-09-05 | Discharge: 2011-09-05 | Disposition: A | Discharge status: Home / Self Care | Payer: Medicare Other | Source: Ambulatory Visit | Attending: Interventional Radiology | Admitting: Interventional Radiology

## 2011-09-05 ENCOUNTER — Encounter: Payer: Self-pay | Admitting: Internal Medicine

## 2011-09-05 ENCOUNTER — Other Ambulatory Visit (HOSPITAL_COMMUNITY): Payer: Self-pay | Admitting: Interventional Radiology

## 2011-09-05 DIAGNOSIS — M8448XA Pathological fracture, other site, initial encounter for fracture: Secondary | ICD-10-CM | POA: Diagnosis not present

## 2011-09-05 DIAGNOSIS — M1990 Unspecified osteoarthritis, unspecified site: Secondary | ICD-10-CM | POA: Insufficient documentation

## 2011-09-05 DIAGNOSIS — M810 Age-related osteoporosis without current pathological fracture: Secondary | ICD-10-CM | POA: Diagnosis not present

## 2011-09-05 DIAGNOSIS — K644 Residual hemorrhoidal skin tags: Secondary | ICD-10-CM | POA: Insufficient documentation

## 2011-09-05 DIAGNOSIS — IMO0002 Reserved for concepts with insufficient information to code with codable children: Secondary | ICD-10-CM

## 2011-09-05 DIAGNOSIS — M546 Pain in thoracic spine: Secondary | ICD-10-CM | POA: Diagnosis not present

## 2011-09-05 DIAGNOSIS — Z950 Presence of cardiac pacemaker: Secondary | ICD-10-CM | POA: Insufficient documentation

## 2011-09-05 DIAGNOSIS — K5730 Diverticulosis of large intestine without perforation or abscess without bleeding: Secondary | ICD-10-CM | POA: Insufficient documentation

## 2011-09-05 DIAGNOSIS — H3530 Unspecified macular degeneration: Secondary | ICD-10-CM | POA: Diagnosis not present

## 2011-09-05 DIAGNOSIS — E039 Hypothyroidism, unspecified: Secondary | ICD-10-CM | POA: Diagnosis not present

## 2011-09-05 DIAGNOSIS — I10 Essential (primary) hypertension: Secondary | ICD-10-CM | POA: Diagnosis not present

## 2011-09-05 DIAGNOSIS — D649 Anemia, unspecified: Secondary | ICD-10-CM | POA: Insufficient documentation

## 2011-09-05 DIAGNOSIS — E785 Hyperlipidemia, unspecified: Secondary | ICD-10-CM | POA: Diagnosis not present

## 2011-09-05 DIAGNOSIS — S22009A Unspecified fracture of unspecified thoracic vertebra, initial encounter for closed fracture: Secondary | ICD-10-CM | POA: Diagnosis not present

## 2011-09-05 LAB — CBC
HCT: 31.7 % — ABNORMAL LOW (ref 36.0–46.0)
RBC: 3.38 MIL/uL — ABNORMAL LOW (ref 3.87–5.11)
RDW: 15.6 % — ABNORMAL HIGH (ref 11.5–15.5)
WBC: 5 10*3/uL (ref 4.0–10.5)

## 2011-09-05 LAB — PROTIME-INR: INR: 1.06 (ref 0.00–1.49)

## 2011-09-05 LAB — BASIC METABOLIC PANEL
Chloride: 97 mEq/L (ref 96–112)
Creatinine, Ser: 0.91 mg/dL (ref 0.50–1.10)
GFR calc Af Amer: 64 mL/min — ABNORMAL LOW (ref >90)
Potassium: 4.1 mEq/L (ref 3.5–5.1)
Sodium: 132 mEq/L — ABNORMAL LOW (ref 135–145)

## 2011-09-05 LAB — APTT: aPTT: 40 seconds — ABNORMAL HIGH (ref 24–37)

## 2011-09-05 MED ORDER — CEFAZOLIN SODIUM 1-5 GM-% IV SOLN
INTRAVENOUS | Status: AC
  Filled 2011-09-05: qty 50

## 2011-09-05 MED ORDER — IOHEXOL 300 MG/ML  SOLN
50.0000 mL | Freq: Once | INTRAMUSCULAR | Status: AC | PRN
  Administered 2011-09-05: 5 mL

## 2011-09-05 MED ORDER — NALOXONE HCL 1 MG/ML IJ SOLN
INTRAMUSCULAR | Status: AC
  Filled 2011-09-05: qty 2

## 2011-09-05 MED ORDER — TOBRAMYCIN SULFATE 1.2 G IJ SOLR
INTRAMUSCULAR | Status: AC
  Filled 2011-09-05: qty 1.2

## 2011-09-05 MED ORDER — FENTANYL CITRATE 0.05 MG/ML IJ SOLN
INTRAMUSCULAR | Status: AC | PRN
  Administered 2011-09-05 (×2): 25 ug via INTRAVENOUS

## 2011-09-05 MED ORDER — MIDAZOLAM HCL 2 MG/2ML IJ SOLN
INTRAMUSCULAR | Status: AC
  Filled 2011-09-05: qty 4

## 2011-09-05 MED ORDER — HYDRALAZINE HCL 20 MG/ML IJ SOLN
INTRAMUSCULAR | Status: AC
  Filled 2011-09-05: qty 2

## 2011-09-05 MED ORDER — CEFAZOLIN SODIUM 1-5 GM-% IV SOLN
1.0000 g | Freq: Once | INTRAVENOUS | Status: AC
  Administered 2011-09-05: 1 g via INTRAVENOUS

## 2011-09-05 MED ORDER — TECHNETIUM TC 99M MEDRONATE IV KIT
25.0000 | PACK | Freq: Once | INTRAVENOUS | Status: AC | PRN
  Administered 2011-09-05: 25 via INTRAVENOUS

## 2011-09-05 MED ORDER — GELATIN ABSORBABLE 12-7 MM EX MISC
CUTANEOUS | Status: AC
  Filled 2011-09-05: qty 1

## 2011-09-05 MED ORDER — FLUMAZENIL 0.5 MG/5ML IV SOLN
INTRAVENOUS | Status: AC
  Filled 2011-09-05: qty 5

## 2011-09-05 MED ORDER — SODIUM CHLORIDE 0.9 % IV SOLN: INTRAVENOUS | Status: AC

## 2011-09-05 MED ORDER — SODIUM CHLORIDE 0.9 % IV SOLN: Freq: Once | INTRAVENOUS | Status: DC

## 2011-09-05 MED ORDER — FENTANYL CITRATE 0.05 MG/ML IJ SOLN
INTRAMUSCULAR | Status: AC
  Filled 2011-09-05: qty 4

## 2011-09-05 MED ORDER — MIDAZOLAM HCL 5 MG/5ML IJ SOLN
INTRAMUSCULAR | Status: AC | PRN
  Administered 2011-09-05 (×2): 1 mg via INTRAVENOUS

## 2011-09-05 MED ORDER — HYDRALAZINE HCL 20 MG/ML IJ SOLN
INTRAMUSCULAR | Status: AC | PRN
  Administered 2011-09-05: 5 mg via INTRAVENOUS

## 2011-09-05 NOTE — H&P (Signed)
[name] is an 78 y.o. female.   Chief Complaint: Thoracic #6 fracture; back pain since Easter- no injury; but + cough (resolved) Consulted Dr Corliss Skains 08/23/11 Bone scan today does not show any other spinal involvement Scheduled now for Thoracic #6 vertebroplasty vs kyphoplasty HPI: back pain; HTN  Past Medical History  Diagnosis Date  . Hypertension   . Hypothyroidism   . Bradycardia   . Anemia   . Diverticulosis   . External hemorrhoids   . Constipation   . Macular degeneration   . Seasonal allergies   . Osteoporosis   . Degenerative joint disease   . Palpitations   . Hyperlipidemia     Past Surgical History  Procedure Date  . Thyroidectomy   . Tonsillectomy   . Colonoscopy November 2011    External hemorrhoids, diverticulosis, anal papillae  . Insert / replace / remove pacemaker     Family History  Problem Relation Age of Onset  . Heart attack Mother     Deceased  . Cancer Father     Deceased  . Cancer Brother     Deceased   Social History:  reports that she has never smoked. She does not have any smokeless tobacco history on file. She reports that she does not drink alcohol or use illicit drugs.  Allergies:  Allergies  Allergen Reactions  . Tramadol Itching  . Sulfa Antibiotics Nausea And Vomiting     (Not in a hospital admission)  Results for orders placed during the hospital encounter of 09/05/11 (from the past 48 hour(s))  APTT     Status: Abnormal   Collection Time   09/05/11 11:51 AM      Component Value Range Comment   aPTT 40 (*) 24 - 37 (seconds)   CBC     Status: Abnormal   Collection Time   09/05/11 11:51 AM      Component Value Range Comment   WBC 5.0  4.0 - 10.5 (K/uL)    RBC 3.38 (*) 3.87 - 5.11 (MIL/uL)    Hemoglobin 10.9 (*) 12.0 - 15.0 (g/dL)    HCT 16.1 (*) 09.6 - 46.0 (%)    MCV 93.8  78.0 - 100.0 (fL)    MCH 32.2  26.0 - 34.0 (pg)    MCHC 34.4  30.0 - 36.0 (g/dL)    RDW 04.5 (*) 40.9 - 15.5 (%)    Platelets 287  150 -  400 (K/uL)   PROTIME-INR     Status: Normal   Collection Time   09/05/11 11:51 AM      Component Value Range Comment   Prothrombin Time 14.0  11.6 - 15.2 (seconds)    INR 1.06  0.00 - 1.49     No results found.  Review of Systems  Constitutional: Negative for fever.  Respiratory: Negative for cough.   Cardiovascular: Negative for chest pain.  Gastrointestinal: Negative for nausea and vomiting.  Musculoskeletal: Positive for back pain.  Neurological: Negative for headaches.    Blood pressure 165/65, pulse 61, temperature 97.4 F (36.3 C), temperature source Oral, resp. rate 18, height 5\' 5"  (1.651 m), weight 144 lb (65.318 kg), SpO2 98.00%. Physical Exam  Constitutional: She is oriented to person, place, and time. She appears well-developed.  Cardiovascular: Normal rate, regular rhythm and normal heart sounds.   No murmur heard. Respiratory: Effort normal and breath sounds normal.  GI: Soft. Bowel sounds are normal. There is no tenderness.  Musculoskeletal: Normal range of motion. She exhibits  tenderness.       Back pain; gait slow  Neurological: She is alert and oriented to person, place, and time.  Skin: Skin is warm and dry.  Psychiatric: She has a normal mood and affect. Her behavior is normal. Judgment and thought content normal.     Assessment/Plan Thoracic #6 fracture; back pain Scheduled now for T6 VP/KP Pt and family aware of procedure benefits and risks and agreeable to proceed. Consent signed  Camilo Mander A 09/05/2011, 12:14 PM

## 2011-09-05 NOTE — Procedures (Signed)
S/P T6 VP 

## 2011-09-05 NOTE — Discharge Instructions (Signed)
1.Not to bend or lift more than 10 ibs for 2 weeks. 2 Use walker for 2 weeks. 3. No driving for 2 weeks . 4 RTC in 2 weeks Myelography Care After These instructions give you information on caring for yourself after your procedure. Your doctor may also give you specific instructions. Call your doctor if you have any problems or questions after your procedure. HOME CARE  Lie down for 24 hours. Lie in any position with 1 pillow under your head.   For 24 hours, get up only to eat or use the bathroom. Take only 10 minutes to eat.   For 24 hours, drink enough fluids to keep your pee (urine) clear or pale yellow. No alcohol.   Take all medicine as told by your doctor.   Avoid heavy lifting and activity for 48 hours.   You may take the bandage off the day after your myelography.   Do not take a bath for 24 hours. Ask your doctor if it is okay to take a shower.  Finding out the results of your test Ask your doctor when your test results will be ready. Make sure you follow up and get the test results. GET HELP RIGHT AWAY IF:   Any of the places where the needles were put in:   Are puffy (swollen) or red.   Are sore or hot to the touch.   Are draining yellowish-white fluid (pus).   Are bleeding after 10 minutes of pressing down on the site. Have someone press on any place that is bleeding until seen by a doctor.   You have a lasting headache that is not helped by medicine.   You have a bad headache with a stiff neck or fever.   You have trouble breathing.   You feel sick to your stomach (nauseous) or throw up (vomit).   You have pain or cramping in your belly (abdomen).   You have a fever.  If you go to the emergency room, tell the doctor you had a myelogram. Take this paper with you to show the doctor. MAKE SURE YOU:  Understand these instructions.   Will watch your condition.   Will get help right away if you are not doing well or get worse.  Document Released: 01/17/2008  Document Revised: 03/29/2011 Document Reviewed: 01/17/2008 ExitCare Patient Information 2012 ExitCare, [place]CKYPHOPLASTY/VERTEBROPLASTY DISCHARGE INSTRUCTIONS  Medications: (check all that apply)     Resume all home medications as before procedure.       Resume your (aspirin/Plavix/Coumadin) on not applicable                  Continue your pain medications as prescribed as needed.  Over the next 3-5 days, decrease your pain medication as tolerated.  Over the counter medications (i.e. Tylenol, ibuprofen, and aleve) may be substituted once severe/moderate pain symptoms have subsided.   Wound Care:   Bandages may be removed the day following your procedure.  You may get your incision wet once bandages are removed.  Bandaids may be used to cover the incisions until scab formation.  Topical ointments are optional.    If you develop a fever greater than 101 degrees, have increased skin redness at the incision sites or pus-like oozing from incisions occurring within 1 week of the procedure, contact radiology at [phone] or [phone].    Ice pack to back for 15-20 minutes 2-3 time per day for first 2-3 days post procedure.  The ice will expedite muscle healing and  help with the pain from the incisions.   Activity:   Bedrest today with limited activity for 24 hours post procedure.    No driving for 48 hours.    Increase your activity as tolerated after bedrest (with assistance if necessary).    Refrain from any strenuous activity or heavy lifting (greater than 10 lbs.).   Follow up:   Contact radiology at [phone] or [phone] if any questions/concerns.    A physician assistant from radiology will contact you in approximately 1 week.    If a biopsy was performed at the time of your procedure, your referring physician should receive the results in usually 2-3 days.        Marland Kitchen

## 2011-09-06 ENCOUNTER — Other Ambulatory Visit (HOSPITAL_COMMUNITY): Payer: Self-pay | Admitting: Interventional Radiology

## 2011-09-06 ENCOUNTER — Telehealth (HOSPITAL_COMMUNITY): Payer: Self-pay

## 2011-09-06 ENCOUNTER — Telehealth (HOSPITAL_COMMUNITY): Payer: Self-pay | Admitting: *Deleted

## 2011-09-06 DIAGNOSIS — I10 Essential (primary) hypertension: Secondary | ICD-10-CM | POA: Diagnosis not present

## 2011-09-06 DIAGNOSIS — IMO0002 Reserved for concepts with insufficient information to code with codable children: Secondary | ICD-10-CM

## 2011-09-06 DIAGNOSIS — D649 Anemia, unspecified: Secondary | ICD-10-CM | POA: Diagnosis not present

## 2011-09-06 DIAGNOSIS — M810 Age-related osteoporosis without current pathological fracture: Secondary | ICD-10-CM | POA: Diagnosis not present

## 2011-09-06 DIAGNOSIS — M546 Pain in thoracic spine: Secondary | ICD-10-CM | POA: Diagnosis not present

## 2011-09-06 DIAGNOSIS — Z09 Encounter for follow-up examination after completed treatment for conditions other than malignant neoplasm: Secondary | ICD-10-CM

## 2011-09-06 DIAGNOSIS — H543 Unqualified visual loss, both eyes: Secondary | ICD-10-CM | POA: Diagnosis not present

## 2011-09-06 DIAGNOSIS — M8448XD Pathological fracture, other site, subsequent encounter for fracture with routine healing: Secondary | ICD-10-CM | POA: Diagnosis not present

## 2011-09-06 NOTE — Telephone Encounter (Signed)
Spoke with [name] to make her aware of her f/u appt.  She stated that she did not sleep very much and she was in a lot of pain.  She stated that she was taking her tylenol.  I advised her to call us if she felt worse.

## 2011-09-06 NOTE — Telephone Encounter (Signed)
Pt doing well. Is taking tylenol for pain.  Reminded to watch for signs of infection, encouraged to call for questions or problems.

## 2011-09-11 DIAGNOSIS — M546 Pain in thoracic spine: Secondary | ICD-10-CM | POA: Diagnosis not present

## 2011-09-11 DIAGNOSIS — D649 Anemia, unspecified: Secondary | ICD-10-CM | POA: Diagnosis not present

## 2011-09-11 DIAGNOSIS — I10 Essential (primary) hypertension: Secondary | ICD-10-CM | POA: Diagnosis not present

## 2011-09-11 DIAGNOSIS — M810 Age-related osteoporosis without current pathological fracture: Secondary | ICD-10-CM | POA: Diagnosis not present

## 2011-09-11 DIAGNOSIS — M8448XD Pathological fracture, other site, subsequent encounter for fracture with routine healing: Secondary | ICD-10-CM | POA: Diagnosis not present

## 2011-09-11 DIAGNOSIS — H543 Unqualified visual loss, both eyes: Secondary | ICD-10-CM | POA: Diagnosis not present

## 2011-09-14 DIAGNOSIS — D649 Anemia, unspecified: Secondary | ICD-10-CM | POA: Diagnosis not present

## 2011-09-14 DIAGNOSIS — M810 Age-related osteoporosis without current pathological fracture: Secondary | ICD-10-CM | POA: Diagnosis not present

## 2011-09-14 DIAGNOSIS — M8448XD Pathological fracture, other site, subsequent encounter for fracture with routine healing: Secondary | ICD-10-CM | POA: Diagnosis not present

## 2011-09-14 DIAGNOSIS — I10 Essential (primary) hypertension: Secondary | ICD-10-CM | POA: Diagnosis not present

## 2011-09-14 DIAGNOSIS — M546 Pain in thoracic spine: Secondary | ICD-10-CM | POA: Diagnosis not present

## 2011-09-14 DIAGNOSIS — H543 Unqualified visual loss, both eyes: Secondary | ICD-10-CM | POA: Diagnosis not present

## 2011-09-18 DIAGNOSIS — M546 Pain in thoracic spine: Secondary | ICD-10-CM | POA: Diagnosis not present

## 2011-09-18 DIAGNOSIS — H543 Unqualified visual loss, both eyes: Secondary | ICD-10-CM | POA: Diagnosis not present

## 2011-09-18 DIAGNOSIS — M8448XD Pathological fracture, other site, subsequent encounter for fracture with routine healing: Secondary | ICD-10-CM | POA: Diagnosis not present

## 2011-09-18 DIAGNOSIS — I10 Essential (primary) hypertension: Secondary | ICD-10-CM | POA: Diagnosis not present

## 2011-09-18 DIAGNOSIS — D649 Anemia, unspecified: Secondary | ICD-10-CM | POA: Diagnosis not present

## 2011-09-18 DIAGNOSIS — M810 Age-related osteoporosis without current pathological fracture: Secondary | ICD-10-CM | POA: Diagnosis not present

## 2011-09-19 ENCOUNTER — Ambulatory Visit (HOSPITAL_COMMUNITY)
Admission: RE | Admit: 2011-09-19 | Discharge: 2011-09-19 | Disposition: A | Discharge status: Home / Self Care | Payer: Medicare Other | Source: Ambulatory Visit | Attending: Interventional Radiology | Admitting: Interventional Radiology

## 2011-09-19 DIAGNOSIS — IMO0002 Reserved for concepts with insufficient information to code with codable children: Secondary | ICD-10-CM

## 2011-09-19 DIAGNOSIS — I10 Essential (primary) hypertension: Secondary | ICD-10-CM | POA: Diagnosis not present

## 2011-09-19 DIAGNOSIS — Z09 Encounter for follow-up examination after completed treatment for conditions other than malignant neoplasm: Secondary | ICD-10-CM

## 2011-09-19 DIAGNOSIS — H543 Unqualified visual loss, both eyes: Secondary | ICD-10-CM | POA: Diagnosis not present

## 2011-09-19 DIAGNOSIS — M810 Age-related osteoporosis without current pathological fracture: Secondary | ICD-10-CM | POA: Diagnosis not present

## 2011-09-19 DIAGNOSIS — M8448XD Pathological fracture, other site, subsequent encounter for fracture with routine healing: Secondary | ICD-10-CM | POA: Diagnosis not present

## 2011-09-19 DIAGNOSIS — D649 Anemia, unspecified: Secondary | ICD-10-CM | POA: Diagnosis not present

## 2011-09-19 DIAGNOSIS — M546 Pain in thoracic spine: Secondary | ICD-10-CM | POA: Diagnosis not present

## 2011-09-20 DIAGNOSIS — M810 Age-related osteoporosis without current pathological fracture: Secondary | ICD-10-CM | POA: Diagnosis not present

## 2011-09-20 DIAGNOSIS — H543 Unqualified visual loss, both eyes: Secondary | ICD-10-CM | POA: Diagnosis not present

## 2011-09-20 DIAGNOSIS — D649 Anemia, unspecified: Secondary | ICD-10-CM | POA: Diagnosis not present

## 2011-09-20 DIAGNOSIS — M546 Pain in thoracic spine: Secondary | ICD-10-CM | POA: Diagnosis not present

## 2011-09-20 DIAGNOSIS — M8448XD Pathological fracture, other site, subsequent encounter for fracture with routine healing: Secondary | ICD-10-CM | POA: Diagnosis not present

## 2011-09-20 DIAGNOSIS — I10 Essential (primary) hypertension: Secondary | ICD-10-CM | POA: Diagnosis not present

## 2011-09-25 DIAGNOSIS — M810 Age-related osteoporosis without current pathological fracture: Secondary | ICD-10-CM | POA: Diagnosis not present

## 2011-09-25 DIAGNOSIS — I10 Essential (primary) hypertension: Secondary | ICD-10-CM | POA: Diagnosis not present

## 2011-09-25 DIAGNOSIS — M8448XD Pathological fracture, other site, subsequent encounter for fracture with routine healing: Secondary | ICD-10-CM | POA: Diagnosis not present

## 2011-09-25 DIAGNOSIS — H543 Unqualified visual loss, both eyes: Secondary | ICD-10-CM | POA: Diagnosis not present

## 2011-09-25 DIAGNOSIS — D649 Anemia, unspecified: Secondary | ICD-10-CM | POA: Diagnosis not present

## 2011-09-25 DIAGNOSIS — M546 Pain in thoracic spine: Secondary | ICD-10-CM | POA: Diagnosis not present

## 2011-09-26 DIAGNOSIS — M8448XD Pathological fracture, other site, subsequent encounter for fracture with routine healing: Secondary | ICD-10-CM | POA: Diagnosis not present

## 2011-09-26 DIAGNOSIS — I10 Essential (primary) hypertension: Secondary | ICD-10-CM | POA: Diagnosis not present

## 2011-09-26 DIAGNOSIS — M810 Age-related osteoporosis without current pathological fracture: Secondary | ICD-10-CM | POA: Diagnosis not present

## 2011-09-26 DIAGNOSIS — M546 Pain in thoracic spine: Secondary | ICD-10-CM | POA: Diagnosis not present

## 2011-09-26 DIAGNOSIS — H543 Unqualified visual loss, both eyes: Secondary | ICD-10-CM | POA: Diagnosis not present

## 2011-09-26 DIAGNOSIS — D649 Anemia, unspecified: Secondary | ICD-10-CM | POA: Diagnosis not present

## 2011-10-01 DIAGNOSIS — H353290 Exudative age-related macular degeneration, unspecified eye, stage unspecified: Secondary | ICD-10-CM | POA: Diagnosis not present

## 2011-10-01 DIAGNOSIS — H35059 Retinal neovascularization, unspecified, unspecified eye: Secondary | ICD-10-CM | POA: Diagnosis not present

## 2011-10-02 DIAGNOSIS — D649 Anemia, unspecified: Secondary | ICD-10-CM | POA: Diagnosis not present

## 2011-10-02 DIAGNOSIS — H543 Unqualified visual loss, both eyes: Secondary | ICD-10-CM | POA: Diagnosis not present

## 2011-10-02 DIAGNOSIS — M546 Pain in thoracic spine: Secondary | ICD-10-CM | POA: Diagnosis not present

## 2011-10-02 DIAGNOSIS — M810 Age-related osteoporosis without current pathological fracture: Secondary | ICD-10-CM | POA: Diagnosis not present

## 2011-10-02 DIAGNOSIS — I10 Essential (primary) hypertension: Secondary | ICD-10-CM | POA: Diagnosis not present

## 2011-10-02 DIAGNOSIS — M8448XD Pathological fracture, other site, subsequent encounter for fracture with routine healing: Secondary | ICD-10-CM | POA: Diagnosis not present

## 2011-10-04 DIAGNOSIS — L820 Inflamed seborrheic keratosis: Secondary | ICD-10-CM | POA: Diagnosis not present

## 2011-10-04 DIAGNOSIS — D235 Other benign neoplasm of skin of trunk: Secondary | ICD-10-CM | POA: Diagnosis not present

## 2011-10-05 DIAGNOSIS — H543 Unqualified visual loss, both eyes: Secondary | ICD-10-CM | POA: Diagnosis not present

## 2011-10-05 DIAGNOSIS — M546 Pain in thoracic spine: Secondary | ICD-10-CM | POA: Diagnosis not present

## 2011-10-05 DIAGNOSIS — D649 Anemia, unspecified: Secondary | ICD-10-CM | POA: Diagnosis not present

## 2011-10-05 DIAGNOSIS — I10 Essential (primary) hypertension: Secondary | ICD-10-CM | POA: Diagnosis not present

## 2011-10-05 DIAGNOSIS — M810 Age-related osteoporosis without current pathological fracture: Secondary | ICD-10-CM | POA: Diagnosis not present

## 2011-10-05 DIAGNOSIS — M8448XD Pathological fracture, other site, subsequent encounter for fracture with routine healing: Secondary | ICD-10-CM | POA: Diagnosis not present

## 2011-10-16 ENCOUNTER — Other Ambulatory Visit (INDEPENDENT_AMBULATORY_CARE_PROVIDER_SITE_OTHER): Payer: Self-pay | Admitting: *Deleted

## 2011-10-16 ENCOUNTER — Encounter (HOSPITAL_COMMUNITY): Payer: Self-pay | Admitting: Pharmacist

## 2011-10-16 ENCOUNTER — Encounter (INDEPENDENT_AMBULATORY_CARE_PROVIDER_SITE_OTHER): Payer: Self-pay | Admitting: Internal Medicine

## 2011-10-16 ENCOUNTER — Ambulatory Visit (INDEPENDENT_AMBULATORY_CARE_PROVIDER_SITE_OTHER): Payer: Medicare Other | Admitting: Internal Medicine

## 2011-10-16 ENCOUNTER — Encounter (INDEPENDENT_AMBULATORY_CARE_PROVIDER_SITE_OTHER): Payer: Self-pay | Admitting: *Deleted

## 2011-10-16 VITALS — BP 122/70 | HR 68 | Temp 97.9°F | Resp 20 | Ht 65.0 in | Wt 147.3 lb

## 2011-10-16 DIAGNOSIS — R1310 Dysphagia, unspecified: Secondary | ICD-10-CM

## 2011-10-16 DIAGNOSIS — K5900 Constipation, unspecified: Secondary | ICD-10-CM | POA: Diagnosis not present

## 2011-10-16 MED ORDER — LINACLOTIDE 145 MCG PO CAPS: 145.0000 ug | ORAL_CAPSULE | Freq: Every day | ORAL | Status: DC

## 2011-10-16 NOTE — Progress Notes (Signed)
Presenting complaint; Painful swallowing with cold liquids. History of present illness; Patient is 78 year old Caucasian female who is referred for courtesy of Dr. Suzan Slick called for GI evaluation. She is accompanied by her son Elijah Birk. She presents for dysphagia of 3-4 months duration and it is primarily when she she drinks cold water or other liquids. She feels as if liquids get stuck in the lower sternal area and eventually go down and she has pain associated with it. She has no difficulty with liquids at room temperature water with solids. This symptom has been gradually getting worse. She remains with good appetite and denies weight loss. She is experiencing heartburn at least one to 3 times a week. It almost always occurs after meals. She denies sore throat chronic cough or hoarseness. She also denies nausea vomiting abdominal pain melena or rectal bleeding. She has chronic constipation and states MiraLax as not working and wants to try another medication. She had barium study on on 08/23/2011 we suggested spastic lower esophageal sphincter which relaxed intermittently to allow passage of contrast and patient had reported discomfort. Current medications; Current Outpatient Prescriptions  Medication Sig Dispense Refill  . amLODipine (NORVASC) 2.5 MG tablet Take 2.5 mg by mouth daily.      . beta carotene w/minerals (OCUVITE) tablet Take 1 tablet by mouth 2 (two) times daily.      . cetirizine (ZYRTEC) 10 MG tablet Take 10 mg by mouth at bedtime.      . cycloSPORINE (RESTASIS) 0.05 % ophthalmic emulsion Place 1 drop into both eyes 2 (two) times daily.       Marland Kitchen docusate sodium (COLACE) 100 MG capsule Take 100 mg by mouth at bedtime.      . hydrALAZINE (APRESOLINE) 25 MG tablet Take 25 mg by mouth 2 (two) times daily.        Marland Kitchen levothyroxine (SYNTHROID, LEVOTHROID) 100 MCG tablet Take 100 mcg by mouth daily.      Marland Kitchen lisinopril (PRINIVIL,ZESTRIL) 20 MG tablet Take 20 mg by mouth 2 (two) times daily.        Marland Kitchen  omeprazole (PRILOSEC) 20 MG capsule Take 20 mg by mouth daily.      . psyllium (HYDROCIL/METAMUCIL) 95 % PACK Take 1 packet by mouth daily.      . simvastatin (ZOCOR) 40 MG tablet Take 40 mg by mouth every evening.      Marland Kitchen acetaminophen (TYLENOL) 325 MG tablet Take 650 mg by mouth every 4 (four) hours as needed. For pain      . alendronate (FOSAMAX) 70 MG tablet Take 70 mg by mouth every 7 (seven) days. Take with a full glass of water on an empty stomach at least 30 min prior to breakfast. Takes on Thursdays.      . polyethylene glycol (MIRALAX / GLYCOLAX) packet Take 17 g by mouth daily.        Past medical history; Hypertension of 8 years duration. She was diagnosed with thyrotoxicosis about 7 years ago and treated with radi iodine ablation and now on maintenance therapy. Hyperlipidemia Osteoporosis. She underwent kyphoplasty to T6 whatsoever out 8 weeks ago. She had tonsillectomy at age 40. She had pacemaker placement in February 2013. She had colonoscopy by me in November 2011 because of heme positive stools. She had multiple diverticula at the sigmoid and ascending colon melanosis coli and external hemorrhoids. Allergies; Tramadol. Sulfa. Digital examination; BP 122/70  Pulse 68  Temp 97.9 F (36.6 C) (Oral)  Resp 20  Ht 5\' 5"  (1.651  m)  Wt 147 lb 4.8 oz (66.815 kg)  BMI 24.51 kg/m2 Pleasant well developed well nourished Caucasian female who appears younger than stated age. Conjunctival is pink. Sclera is nonicteric. Oral pharyngeal mucosa is normal. She has partial lower plate. No neck masses or thyromegaly noted. Cardiac exam is regular rhythm normal S1 and S2. No murmur or gallop noted. Lungs are clear to auscultation. Abdomen is symmetrical. Bowel sounds are normal. On palpation soft abdomen with mild midepigastric tenderness. No organomegaly or masses noted. No peripheral edema or clubbing noted. Lab data; Barium study view the patient. Results as  above Assessment; Patient is 78 year old Caucasian female who presents with a few months history of painful swallowing with cold liquids only. His symptoms seem to have gotten worse gradually. Recent barium esophagogram suggestive distal esophageal spasm. I suspect she has developed esophagitis secondary to Fosamax. Mucosal injury may not be obvious on daily and study. If this is the case she should not be on this or similar medications. Therefore direct examination of upper GI tract would be recommended. Chronic constipation. MiraLax as not working. Recommendations; Discontinue Fosamax and MiraLax. Continue omeprazole at current dose. Linaclotide 145 mcg by mouth every morning. 4 week supply given. Esophagogastroduodenoscopy and possible is a fascial dilation in near future. I have reviewed the procedure with the patient and her son and they're both agreeable.  Marland Kitchen

## 2011-10-16 NOTE — Patient Instructions (Signed)
Esophagogastroduodenoscopy and possible esophageal dilation to be scheduled. New medication for constipation is Linzess or Linaclotide. Can take Norvasc Levoxyl and lisinopril on the day of procedure with few sips of water.

## 2011-10-19 ENCOUNTER — Ambulatory Visit (HOSPITAL_COMMUNITY)
Admission: RE | Admit: 2011-10-19 | Discharge: 2011-10-19 | Disposition: A | Discharge status: Home / Self Care | Payer: Medicare Other | Source: Ambulatory Visit | Attending: Internal Medicine | Admitting: Internal Medicine

## 2011-10-19 ENCOUNTER — Encounter (INDEPENDENT_AMBULATORY_CARE_PROVIDER_SITE_OTHER): Payer: Self-pay

## 2011-10-19 ENCOUNTER — Encounter (HOSPITAL_COMMUNITY)
Admission: RE | Disposition: A | Discharge status: Home / Self Care | Payer: Self-pay | Source: Ambulatory Visit | Attending: Internal Medicine

## 2011-10-19 ENCOUNTER — Encounter (HOSPITAL_COMMUNITY): Payer: Self-pay | Admitting: *Deleted

## 2011-10-19 DIAGNOSIS — I10 Essential (primary) hypertension: Secondary | ICD-10-CM | POA: Diagnosis not present

## 2011-10-19 DIAGNOSIS — R1310 Dysphagia, unspecified: Secondary | ICD-10-CM | POA: Insufficient documentation

## 2011-10-19 DIAGNOSIS — D131 Benign neoplasm of stomach: Secondary | ICD-10-CM | POA: Insufficient documentation

## 2011-10-19 DIAGNOSIS — Z79899 Other long term (current) drug therapy: Secondary | ICD-10-CM | POA: Insufficient documentation

## 2011-10-19 DIAGNOSIS — R933 Abnormal findings on diagnostic imaging of other parts of digestive tract: Secondary | ICD-10-CM | POA: Diagnosis not present

## 2011-10-19 DIAGNOSIS — E785 Hyperlipidemia, unspecified: Secondary | ICD-10-CM | POA: Diagnosis not present

## 2011-10-19 DIAGNOSIS — K449 Diaphragmatic hernia without obstruction or gangrene: Secondary | ICD-10-CM | POA: Insufficient documentation

## 2011-10-19 HISTORY — DX: Gastro-esophageal reflux disease without esophagitis: K21.9

## 2011-10-19 SURGERY — ESOPHAGOGASTRODUODENOSCOPY (EGD) WITH ESOPHAGEAL DILATION
Anesthesia: Moderate Sedation

## 2011-10-19 MED ORDER — MIDAZOLAM HCL 5 MG/5ML IJ SOLN
INTRAMUSCULAR | Status: DC | PRN
  Administered 2011-10-19: 2 mg via INTRAVENOUS

## 2011-10-19 MED ORDER — MIDAZOLAM HCL 5 MG/5ML IJ SOLN
INTRAMUSCULAR | Status: AC
  Filled 2011-10-19: qty 10

## 2011-10-19 MED ORDER — MEPERIDINE HCL 50 MG/ML IJ SOLN
INTRAMUSCULAR | Status: AC
  Filled 2011-10-19: qty 1

## 2011-10-19 MED ORDER — SODIUM CHLORIDE 0.45 % IV SOLN
Freq: Once | INTRAVENOUS | Status: AC
  Administered 2011-10-19: 10:00:00 via INTRAVENOUS

## 2011-10-19 MED ORDER — BUTAMBEN-TETRACAINE-BENZOCAINE 2-2-14 % EX AERO
INHALATION_SPRAY | CUTANEOUS | Status: DC | PRN
  Administered 2011-10-19: 2 via TOPICAL

## 2011-10-19 MED ORDER — MEPERIDINE HCL 25 MG/ML IJ SOLN
INTRAMUSCULAR | Status: DC | PRN
  Administered 2011-10-19: 20 mg via INTRAVENOUS

## 2011-10-19 MED ORDER — STERILE WATER FOR IRRIGATION IR SOLN
Status: DC | PRN
  Administered 2011-10-19: 10:00:00

## 2011-10-19 NOTE — H&P (Signed)
[name] is an 78 y.o. female.   Chief Complaint: Patient is for esophagogastroduodenoscopy and possible esophageal dilation. HPI: Patient is a [age]-year-old Caucasian female who has noted dysphagia/odynophagia with cold liquids for the last few months. She did not respond to PPI. She had barium study would suggest spasm at GE junction. Her was some delay in passage of bolus across GE junction. She has been on Fosamax with lost dose one week ago. Patient is advised to stop this medication. She denies anorexia abdominal pain or melena.  Past Medical History  Diagnosis Date  . Hypertension   . Hypothyroidism   . Bradycardia   . Anemia   . Diverticulosis   . External hemorrhoids   . Constipation   . Macular degeneration   . Seasonal allergies   . Osteoporosis   . Degenerative joint disease   . Palpitations   . Hyperlipidemia   . GERD (gastroesophageal reflux disease)     Past Surgical History  Procedure Date  . Thyroidectomy   . Tonsillectomy   . Colonoscopy November 2011    External hemorrhoids, diverticulosis, anal papillae  . Insert / replace / remove pacemaker     Family History  Problem Relation Age of Onset  . Heart attack Mother     Deceased  . Cancer Father     Deceased  . Cancer Brother     Deceased   Social History:  reports that she has never smoked. She has never used smokeless tobacco. She reports that she does not drink alcohol or use illicit drugs.  Allergies:  Allergies  Allergen Reactions  . Tramadol Itching  . Sulfa Antibiotics Nausea And Vomiting    Medications Prior to Admission  Medication Sig Dispense Refill  . acetaminophen (TYLENOL) 325 MG tablet Take 650 mg by mouth every 4 (four) hours as needed. For pain      . alendronate (FOSAMAX) 70 MG tablet Take 70 mg by mouth every 7 (seven) days. Take with a full glass of water on an empty stomach at least 30 min prior to breakfast. Takes on Thursdays.      Marland Kitchen amLODipine (NORVASC) 2.5 MG tablet  Take 2.5 mg by mouth daily.      . beta carotene w/minerals (OCUVITE) tablet Take 1 tablet by mouth 2 (two) times daily.      . cetirizine (ZYRTEC) 10 MG tablet Take 10 mg by mouth at bedtime.      . cycloSPORINE (RESTASIS) 0.05 % ophthalmic emulsion Place 1 drop into both eyes 2 (two) times daily.       Marland Kitchen docusate sodium (COLACE) 100 MG capsule Take 100 mg by mouth at bedtime.      . hydrALAZINE (APRESOLINE) 25 MG tablet Take 25 mg by mouth 2 (two) times daily.        Marland Kitchen levothyroxine (SYNTHROID, LEVOTHROID) 100 MCG tablet Take 100 mcg by mouth daily.      Marland Kitchen lisinopril (PRINIVIL,ZESTRIL) 20 MG tablet Take 20 mg by mouth 2 (two) times daily.        Marland Kitchen omeprazole (PRILOSEC) 20 MG capsule Take 20 mg by mouth daily.      . polyethylene glycol (MIRALAX / GLYCOLAX) packet Take 17 g by mouth daily.      . psyllium (HYDROCIL/METAMUCIL) 95 % PACK Take 1 packet by mouth daily.      . simvastatin (ZOCOR) 40 MG tablet Take 40 mg by mouth every evening.        No results found  for this or any previous visit (from the past 48 hour(s)). No results found.  ROS  Blood pressure 160/73, pulse 88, temperature 98 F (36.7 C), temperature source Oral, resp. rate 23, height 5\' 5"  (1.651 m), weight 147 lb (66.679 kg), SpO2 97.00%. Physical Exam  Constitutional: She appears well-developed and well-nourished.  HENT:  Mouth/Throat: Oropharynx is clear and moist.  Eyes: Conjunctivae are normal. No scleral icterus.  Neck: No thyromegaly present.  Cardiovascular: Normal rate and regular rhythm.   No murmur heard. Respiratory: Effort normal and breath sounds normal.  GI: Soft. She exhibits no distension and no mass. There is no tenderness.  Musculoskeletal: She exhibits no edema.  Lymphadenopathy:    She has no cervical adenopathy.  Neurological: She is alert.  Skin: Skin is warm and dry.     Assessment/Plan Dysphagia/odynophagia to cold liquid and  abnormal barium esophagogram. EGD possible  ED.  Ashten Prats U 10/19/2011, 10:15 AM

## 2011-10-19 NOTE — Discharge Instructions (Signed)
Resume usual medications including Fosamax(alenfronate)   Resume usual diet. No driving for 24 hours.   Please call office with progress report in one week.Esophagogastroduodenoscopy This is an endoscopic procedure (a procedure that uses a device like a flexible telescope) that allows your caregiver to view the upper stomach and small bowel. This test allows your caregiver to look at the esophagus. The esophagus carries food from your mouth to your stomach. They can also look at your duodenum. This is the first part of the small intestine that attaches to the stomach. This test is used to detect problems in the bowel such as ulcers and inflammation. PREPARATION FOR TEST Nothing to eat after midnight the day before the test. NORMAL FINDINGS Normal esophagus, stomach, and duodenum. Ranges for normal findings may vary among different laboratories and hospitals. You should always check with your doctor after having lab work or other tests done to discuss the meaning of your test results and whether your values are considered within normal limits. MEANING OF TEST  Your caregiver will go over the test results with you and discuss the importance and meaning of your results, as well as treatment options and the need for additional tests if necessary. OBTAINING THE TEST RESULTS It is your responsibility to obtain your test results. Ask the lab or department performing the test when and how you will get your results. Document Released: 08/10/2004 Document Revised: 03/29/2011 Document Reviewed: 03/19/2008 [facility] Patient Information [address], [place].

## 2011-10-19 NOTE — Op Note (Signed)
EGD PROCEDURE REPORT  PATIENT:  [name]  MR#:  [id number] Birthdate:  01/01/1923, 78 y.o., female Endoscopist:  Dr. Malissa Hippo, MD Referred By:  Dr. Catalina Pizza, MD Procedure Date: 10/19/2011  Procedure:   EGD with ED.  Indications:  Patient is a [age]-year-old Caucasian female who presents for dysphagia/odynophagia but cold liquids. Barium esophagogram suggested spasm at GE junction with some delay in passage of contrast into stomach. Patient has been on Fosamax and possibly has pill esophagitis.           Informed Consent:  The risks, benefits, alternatives & imponderables which include, but are not limited to, bleeding, infection, perforation, drug reaction and potential missed lesion have been reviewed.  The potential for biopsy, lesion removal, esophageal dilation, etc. have also been discussed.  Questions have been answered.  All parties agreeable.  Please see history & physical in medical record for more information.  Medications:  Demerol  20 mg IV Versed  2 mg IV Cetacaine spray topically for oropharyngeal anesthesia  Description of procedure:  The endoscope was introduced through the mouth and advanced to the second portion of the duodenum without difficulty or limitations. The mucosal surfaces were surveyed very carefully during advancement of the scope and upon withdrawal.  Findings:  Esophagus:  Mucosa of the esophagus was normal. No ring or stricture noted at GE junction. GEJ:  37 cm Hiatus:  39 cm Stomach:  Stomach was empty and distended very well with insufflation. Folds in the proximal stomach are normal. Examination of mucosa at body, antrum, pyloric channel as well as angularis, fundus and cardia was normal with exception of two  small hyperplastic polyps( fundus) which were left alone. Duodenum:  Normal bulbar and post bulbar mucosa.   Therapeutic/Diagnostic Maneuvers Performed:  Esophagus dilated by passing 54 French Maloney dilator to full insertion.  Endoscope was passed again and esophageal mucosa reexamined and no mucosal disruption noted.  Complications:  None  Impression:  Small sliding heart hernia without ring stricture or evidence of esophagitis. Two small hyperplastic-appearing polyps the gastric fundus which are left alone. Esophagus dilated by passing 54 French Maloney dilator but no mucosal disruption noted.  Recommendations:  Patient will continue anti-reflux therapy. Since she does not have evidence of pill esophagitis she can go back on Fosamax. Patient will call with progress report next week.  Lleyton Byers U  10/19/2011  10:38 AM  CC: Dr. Dwana Melena, MD & Dr. Bonnetta Barry ref. provider found

## 2011-11-06 DIAGNOSIS — H353290 Exudative age-related macular degeneration, unspecified eye, stage unspecified: Secondary | ICD-10-CM | POA: Diagnosis not present

## 2011-11-06 DIAGNOSIS — H35359 Cystoid macular degeneration, unspecified eye: Secondary | ICD-10-CM | POA: Diagnosis not present

## 2011-11-06 DIAGNOSIS — H35059 Retinal neovascularization, unspecified, unspecified eye: Secondary | ICD-10-CM | POA: Diagnosis not present

## 2011-12-01 ENCOUNTER — Emergency Department (HOSPITAL_COMMUNITY): Payer: Medicare Other

## 2011-12-01 ENCOUNTER — Encounter (HOSPITAL_COMMUNITY): Payer: Self-pay | Admitting: *Deleted

## 2011-12-01 ENCOUNTER — Emergency Department (HOSPITAL_COMMUNITY)
Admission: EM | Admit: 2011-12-01 | Discharge: 2011-12-01 | Disposition: A | Discharge status: Home / Self Care | Payer: Medicare Other | Attending: Emergency Medicine | Admitting: Emergency Medicine

## 2011-12-01 DIAGNOSIS — E039 Hypothyroidism, unspecified: Secondary | ICD-10-CM | POA: Diagnosis not present

## 2011-12-01 DIAGNOSIS — K5900 Constipation, unspecified: Secondary | ICD-10-CM | POA: Diagnosis not present

## 2011-12-01 DIAGNOSIS — R1032 Left lower quadrant pain: Secondary | ICD-10-CM | POA: Diagnosis not present

## 2011-12-01 DIAGNOSIS — I10 Essential (primary) hypertension: Secondary | ICD-10-CM | POA: Diagnosis not present

## 2011-12-01 DIAGNOSIS — R10819 Abdominal tenderness, unspecified site: Secondary | ICD-10-CM | POA: Diagnosis not present

## 2011-12-01 DIAGNOSIS — R110 Nausea: Secondary | ICD-10-CM | POA: Diagnosis not present

## 2011-12-01 DIAGNOSIS — K5732 Diverticulitis of large intestine without perforation or abscess without bleeding: Secondary | ICD-10-CM | POA: Insufficient documentation

## 2011-12-01 DIAGNOSIS — R109 Unspecified abdominal pain: Secondary | ICD-10-CM | POA: Diagnosis not present

## 2011-12-01 DIAGNOSIS — K5792 Diverticulitis of intestine, part unspecified, without perforation or abscess without bleeding: Secondary | ICD-10-CM

## 2011-12-01 DIAGNOSIS — Z79899 Other long term (current) drug therapy: Secondary | ICD-10-CM | POA: Diagnosis not present

## 2011-12-01 LAB — BASIC METABOLIC PANEL
BUN: 16 mg/dL (ref 6–23)
Calcium: 9 mg/dL (ref 8.4–10.5)
Creatinine, Ser: 1.24 mg/dL — ABNORMAL HIGH (ref 0.50–1.10)
GFR calc Af Amer: 44 mL/min — ABNORMAL LOW (ref >90)
GFR calc non Af Amer: 38 mL/min — ABNORMAL LOW (ref >90)

## 2011-12-01 LAB — CBC WITH DIFFERENTIAL/PLATELET
Basophils Relative: 1 % (ref 0–1)
Eosinophils Absolute: 0.1 10*3/uL (ref 0.0–0.7)
HCT: 28.2 % — ABNORMAL LOW (ref 36.0–46.0)
Hemoglobin: 9.6 g/dL — ABNORMAL LOW (ref 12.0–15.0)
MCH: 32.2 pg (ref 26.0–34.0)
MCHC: 34 g/dL (ref 30.0–36.0)
Monocytes Absolute: 0.6 10*3/uL (ref 0.1–1.0)
Monocytes Relative: 11 % (ref 3–12)

## 2011-12-01 LAB — URINALYSIS, ROUTINE W REFLEX MICROSCOPIC
Bilirubin Urine: NEGATIVE
Ketones, ur: NEGATIVE mg/dL
Nitrite: NEGATIVE
Urobilinogen, UA: 0.2 mg/dL (ref 0.0–1.0)

## 2011-12-01 MED ORDER — HYDROMORPHONE HCL PF 1 MG/ML IJ SOLN
1.0000 mg | Freq: Once | INTRAMUSCULAR | Status: AC
  Administered 2011-12-01: 1 mg via INTRAVENOUS
  Filled 2011-12-01: qty 1

## 2011-12-01 MED ORDER — ONDANSETRON HCL 4 MG/2ML IJ SOLN
4.0000 mg | Freq: Once | INTRAMUSCULAR | Status: AC
  Administered 2011-12-01: 4 mg via INTRAVENOUS
  Filled 2011-12-01: qty 2

## 2011-12-01 MED ORDER — METRONIDAZOLE 500 MG PO TABS: 500.0000 mg | ORAL_TABLET | Freq: Three times a day (TID) | ORAL | Status: AC

## 2011-12-01 MED ORDER — SODIUM CHLORIDE 0.9 % IV SOLN
Freq: Once | INTRAVENOUS | Status: AC
  Administered 2011-12-01: 16:00:00 via INTRAVENOUS

## 2011-12-01 MED ORDER — OXYCODONE-ACETAMINOPHEN 5-325 MG PO TABS: 1.0000 | ORAL_TABLET | ORAL | Status: AC | PRN

## 2011-12-01 MED ORDER — METRONIDAZOLE 500 MG PO TABS
500.0000 mg | ORAL_TABLET | Freq: Once | ORAL | Status: AC
  Administered 2011-12-01: 500 mg via ORAL
  Filled 2011-12-01: qty 1

## 2011-12-01 MED ORDER — CIPROFLOXACIN HCL 250 MG PO TABS
500.0000 mg | ORAL_TABLET | Freq: Once | ORAL | Status: AC
  Administered 2011-12-01: 500 mg via ORAL
  Filled 2011-12-01: qty 2

## 2011-12-01 MED ORDER — IOHEXOL 300 MG/ML  SOLN
80.0000 mL | Freq: Once | INTRAMUSCULAR | Status: AC | PRN
  Administered 2011-12-01: 80 mL via INTRAVENOUS

## 2011-12-01 MED ORDER — CIPROFLOXACIN HCL 500 MG PO TABS: 500.0000 mg | ORAL_TABLET | Freq: Two times a day (BID) | ORAL | Status: AC

## 2011-12-01 NOTE — ED Notes (Signed)
Discharge instructions reviewed with pt, questions answered. Pt verbalized understanding.  

## 2011-12-01 NOTE — ED Provider Notes (Signed)
History   This chart was scribed for Dione Booze, MD by Toya Smothers. The patient was seen in room APA04/APA04. Patient's care was started at 1501.  CSN: [id number]  Arrival date & time 12/01/11  1501   First MD Initiated Contact with Patient 12/01/11 1522      Chief Complaint  Patient presents with  . Abdominal Pain   The history is provided by the patient. No language interpreter was used.    [name] is a 78 y.o. female with a history of diverticulitis presents to the [facility]art[facility] complaining of localized left side abdominal pain onset 2 weeks ago. Abdominal pain has been worsening for the past 2 weeks. Pain is described similar to that of previous episodes of diverticulitis being a constant ache, acutely aggravated with movement, and  rated 6/10. Patient endorses associated nausea and constipation. Denies diarrhea, emesis, fever, chills, sweats.  Pt lists PCP as Dr. Margo Aye  Past Medical History  Diagnosis Date  . Hypertension   . Hypothyroidism   . Bradycardia   . Anemia   . Diverticulosis   . External hemorrhoids   . Constipation   . Macular degeneration   . Seasonal allergies   . Osteoporosis   . Degenerative joint disease   . Palpitations   . Hyperlipidemia   . GERD (gastroesophageal reflux disease)     Past Surgical History  Procedure Date  . Thyroidectomy   . Tonsillectomy   . Colonoscopy November 2011    External hemorrhoids, diverticulosis, anal papillae  . Insert / replace / remove pacemaker     Family History  Problem Relation Age of Onset  . Heart attack Mother     Deceased  . Cancer Father     Deceased  . Cancer Brother     Deceased    History  Substance Use Topics  . Smoking status: Never Smoker   . Smokeless tobacco: Never Used  . Alcohol Use: No   Review of Systems  Constitutional: Negative for fever.  HENT: Negative for rhinorrhea.   Eyes: Negative for pain.  Respiratory: Negative for cough and shortness of breath.     Cardiovascular: Negative for chest pain.  Gastrointestinal: Positive for nausea, abdominal pain and constipation. Negative for vomiting and diarrhea.  Genitourinary: Negative for dysuria.  Musculoskeletal: Negative for back pain.  Skin: Negative for rash.  Neurological: Negative for weakness and headaches.  All other systems reviewed and are negative.    Allergies  Tramadol and Sulfa antibiotics  Home Medications   Current Outpatient Rx  Name Route Sig Dispense Refill  . ACETAMINOPHEN 325 MG PO TABS Oral Take 650 mg by mouth every 4 (four) hours as needed. For pain    . ALPRAZOLAM 0.25 MG PO TABS Oral Take 0.25 mg by mouth every 8 (eight) hours as needed. For anxiety    . AMLODIPINE BESYLATE 2.5 MG PO TABS Oral Take 2.5 mg by mouth daily.    . OCUVITE PO TABS Oral Take 1 tablet by mouth 2 (two) times daily.    Marland Kitchen CETIRIZINE HCL 10 MG PO TABS Oral Take 10 mg by mouth at bedtime.    . CYCLOSPORINE 0.05 % OP EMUL Both Eyes Place 1 drop into both eyes 2 (two) times daily.     Marland Kitchen DOCUSATE SODIUM 100 MG PO CAPS Oral Take 100 mg by mouth at bedtime.    Marland Kitchen HYDRALAZINE HCL 25 MG PO TABS Oral Take 25 mg by mouth 2 (two) times daily.      Marland Kitchen  HYDROCODONE-HOMATROPINE 5-1.5 MG/5ML PO SYRP Oral Take 5 mLs by mouth every 6 (six) hours as needed. For cough    . LEVOTHYROXINE SODIUM 100 MCG PO TABS Oral Take 100 mcg by mouth daily.    Marland Kitchen LINACLOTIDE 145 MCG PO CAPS Oral Take 145 mcg by mouth at bedtime.    Marland Kitchen LISINOPRIL 20 MG PO TABS Oral Take 20 mg by mouth 2 (two) times daily.      Marland Kitchen MAGNESIUM HYDROXIDE 400 MG/5ML PO SUSP Oral Take 30 mLs by mouth daily as needed. For constipation    . OMEPRAZOLE 20 MG PO CPDR Oral Take 20 mg by mouth daily.    Marland Kitchen PROMETHAZINE HCL 25 MG PO TABS Oral Take 25 mg by mouth every 6 (six) hours as needed. For nausea    . PSYLLIUM 95 % PO PACK Oral Take 1 packet by mouth daily.    Marland Kitchen SIMVASTATIN 40 MG PO TABS Oral Take 40 mg by mouth every evening.    Marland Kitchen TRAMADOL HCL 50 MG PO  TABS Oral Take 50 mg by mouth every 8 (eight) hours as needed. For severe pain      BP 169/65  Pulse 61  Temp 97.9 F (36.6 C) (Oral)  Resp 18  Ht 5\' 5"  (1.651 m)  Wt 148 lb (67.132 kg)  BMI 24.63 kg/m2  SpO2 98%  Physical Exam  Nursing note and vitals reviewed. Constitutional:       Awake, alert, nontoxic appearance. Elderly.  HENT:  Head: Atraumatic.  Eyes: Right eye exhibits no discharge. Left eye exhibits no discharge.  Neck: Neck supple.  Pulmonary/Chest: Effort normal. She exhibits no tenderness.  Abdominal: Soft. There is no tenderness. There is no rebound and no guarding.       Slightly distended with moderate periumbilical and LLQ tenderness.   Musculoskeletal: She exhibits no tenderness.       Baseline ROM, no obvious new focal weakness.  Neurological:       Mental status and motor strength appears baseline for patient and situation.  Skin: No rash noted.  Psychiatric: She has a normal mood and affect.    ED Course  Procedures (including critical care time) DIAGNOSTIC STUDIES: Oxygen Saturation is 98% on room ar, normal by my interpretation.    COORDINATION OF CARE: 1532- Evaluated Pt. Pt is without distress, awak, and alert. Pt is felling better since arrival. 1545- Ordered 0.9 % sodium chloride infusion Once. 1545- Ordered HYDROmorphone (DILAUDID) injection 1 mg Once. 1545- Ordered ondansetron (ZOFRAN) injection 4 mg Once.   Results for orders placed during the hospital encounter of 12/01/11  CBC WITH DIFFERENTIAL      Component Value Range   WBC 5.9  4.0 - 10.5 K/uL   RBC 2.98 (*) 3.87 - 5.11 MIL/uL   Hemoglobin 9.6 (*) 12.0 - 15.0 g/dL   HCT 16.1 (*) 09.6 - 04.5 %   MCV 94.6  78.0 - 100.0 fL   MCH 32.2  26.0 - 34.0 pg   MCHC 34.0  30.0 - 36.0 g/dL   RDW 40.9  81.1 - 91.4 %   Platelets 279  150 - 400 K/uL   Neutrophils Relative 60  43 - 77 %   Neutro Abs 3.5  1.7 - 7.7 K/uL   Lymphocytes Relative 27  12 - 46 %   Lymphs Abs 1.6  0.7 - 4.0 K/uL    Monocytes Relative 11  3 - 12 %   Monocytes Absolute 0.6  0.1 - 1.0 K/uL  Eosinophils Relative 2  0 - 5 %   Eosinophils Absolute 0.1  0.0 - 0.7 K/uL   Basophils Relative 1  0 - 1 %   Basophils Absolute 0.0  0.0 - 0.1 K/uL  BASIC METABOLIC PANEL      Component Value Range   Sodium 132 (*) 135 - 145 mEq/L   Potassium 4.4  3.5 - 5.1 mEq/L   Chloride 97  96 - 112 mEq/L   CO2 28  19 - 32 mEq/L   Glucose, Bld 99  70 - 99 mg/dL   BUN 16  6 - 23 mg/dL   Creatinine, Ser 1.61 (*) 0.50 - 1.10 mg/dL   Calcium 9.0  8.4 - 09.6 mg/dL   GFR calc non Af Amer 38 (*) >90 mL/min   GFR calc Af Amer 44 (*) >90 mL/min  URINALYSIS, ROUTINE W REFLEX MICROSCOPIC      Component Value Range   Color, Urine YELLOW  YELLOW   APPearance CLEAR  CLEAR   Specific Gravity, Urine 1.015  1.005 - 1.030   pH 6.5  5.0 - 8.0   Glucose, UA NEGATIVE  NEGATIVE mg/dL   Hgb urine dipstick NEGATIVE  NEGATIVE   Bilirubin Urine NEGATIVE  NEGATIVE   Ketones, ur NEGATIVE  NEGATIVE mg/dL   Protein, ur NEGATIVE  NEGATIVE mg/dL   Urobilinogen, UA 0.2  0.0 - 1.0 mg/dL   Nitrite NEGATIVE  NEGATIVE   Leukocytes, UA NEGATIVE  NEGATIVE   Ct Abdomen Pelvis W Contrast  12/01/2011  *RADIOLOGY REPORT*  Clinical Data: Abdominal pain, history diverticulitis.  CT ABDOMEN AND PELVIS WITH CONTRAST  Technique:  Multidetector CT imaging of the abdomen and pelvis was performed following the standard protocol during bolus administration of intravenous contrast.  Contrast: 80mL OMNIPAQUE IOHEXOL 300 MG/ML  SOLN  Comparison: CT abdomen 01/18/2011  Findings: Lung bases are clear.  A small pericardial effusion is present.  Non-IV contrast images demonstrate no focal hepatic lesion.  The gallbladder, pancreas, spleen, adrenal glands, and kidneys are normal.  Small hiatal hernia is present.  The stomach contains the majority of the ingested oral contrast.  Trace contrast flows into the second portion the duodenum.  No obstructive lesions identified.  The  small bowel, cecum, appendix are normal.  There multiple diverticula throughout the colon and sigmoid colon.  High densities diverticula within the sigmoid colon.  Within the sigmoid colon, there is one focus of pericolonic mild fat stranding and inflammation (image 59, series 2, image 41 of series 4). Inflammation is adjacent to a diverticulum of the sigmoid colon just superior to the uterus.  Abdominal aorta normal caliber.  No retroperitoneal or periportal lymphadenopathy.  The  There are calcified fibroids in the uterus.  The bladder is normal. No pelvic lymphadenopathy.  No free fluid the pelvis.  IMPRESSION:  1.  Mild acute diverticulitis of the sigmoid colon with no complicating features.  2.  Extensive diverticulosis of the sigmoid colon. 3.  Poor egress of oral contrast from the stomach to the duodenum. No obstructing lesion is identified.  This is nonspecific finding but could indicate gastroparesis.  The patient was not experiencing vomiting to suggest a gastric outlet obstruction.  Original Report Authenticated By: Genevive Bi, M.D.      1. Diverticulitis       MDM  Abdominal pain suspicious for diverticulitis. Given the patient's age, CT will be obtained to make sure there is not other pathology make sure there no complications.  CT confirms uncomplicated  diverticulitis. WBC is normal. She'll be sent home with prescriptions for her ciprofloxacin and metronidazole. She is also given a prescription for Percocet for pain. She states she has Phenergan tablets that she can use as needed for nausea. She is to followup with her PCP in 5 days.  I personally performed the services described in this documentation, which was scribed in my presence. The recorded information has been reviewed and considered.        Dione Booze, MD 12/01/11 [phone]

## 2011-12-01 NOTE — ED Notes (Signed)
Pt c/o left side abd pain, states that she has been eating tomatoes, pain started a week ago, has hx of diverticulitis, pt states that this pain feels the same as when she has had diverticulitis in the past.

## 2011-12-12 DIAGNOSIS — R5383 Other fatigue: Secondary | ICD-10-CM | POA: Diagnosis not present

## 2011-12-12 DIAGNOSIS — R630 Anorexia: Secondary | ICD-10-CM | POA: Diagnosis not present

## 2011-12-12 DIAGNOSIS — R5381 Other malaise: Secondary | ICD-10-CM | POA: Diagnosis not present

## 2011-12-12 DIAGNOSIS — M6281 Muscle weakness (generalized): Secondary | ICD-10-CM | POA: Diagnosis not present

## 2011-12-19 DIAGNOSIS — H353290 Exudative age-related macular degeneration, unspecified eye, stage unspecified: Secondary | ICD-10-CM | POA: Diagnosis not present

## 2011-12-19 DIAGNOSIS — H35359 Cystoid macular degeneration, unspecified eye: Secondary | ICD-10-CM | POA: Diagnosis not present

## 2011-12-19 DIAGNOSIS — H35059 Retinal neovascularization, unspecified, unspecified eye: Secondary | ICD-10-CM | POA: Diagnosis not present

## 2012-01-03 DIAGNOSIS — Z23 Encounter for immunization: Secondary | ICD-10-CM | POA: Diagnosis not present

## 2012-01-03 DIAGNOSIS — R1032 Left lower quadrant pain: Secondary | ICD-10-CM | POA: Diagnosis not present

## 2012-01-15 ENCOUNTER — Encounter (HOSPITAL_COMMUNITY): Payer: Self-pay

## 2012-01-15 ENCOUNTER — Emergency Department (HOSPITAL_COMMUNITY): Payer: Medicare Other

## 2012-01-15 ENCOUNTER — Emergency Department (HOSPITAL_COMMUNITY)
Admission: EM | Admit: 2012-01-15 | Discharge: 2012-01-15 | Disposition: A | Discharge status: Home / Self Care | Payer: Medicare Other | Attending: Emergency Medicine | Admitting: Emergency Medicine

## 2012-01-15 DIAGNOSIS — K5900 Constipation, unspecified: Secondary | ICD-10-CM | POA: Diagnosis not present

## 2012-01-15 DIAGNOSIS — I10 Essential (primary) hypertension: Secondary | ICD-10-CM | POA: Diagnosis not present

## 2012-01-15 DIAGNOSIS — Z79899 Other long term (current) drug therapy: Secondary | ICD-10-CM | POA: Diagnosis not present

## 2012-01-15 DIAGNOSIS — K5732 Diverticulitis of large intestine without perforation or abscess without bleeding: Secondary | ICD-10-CM | POA: Insufficient documentation

## 2012-01-15 DIAGNOSIS — D259 Leiomyoma of uterus, unspecified: Secondary | ICD-10-CM | POA: Diagnosis not present

## 2012-01-15 DIAGNOSIS — K5792 Diverticulitis of intestine, part unspecified, without perforation or abscess without bleeding: Secondary | ICD-10-CM

## 2012-01-15 HISTORY — DX: Presence of cardiac pacemaker: Z95.0

## 2012-01-15 LAB — LIPASE, BLOOD: Lipase: 36 U/L (ref 11–59)

## 2012-01-15 LAB — COMPREHENSIVE METABOLIC PANEL
ALT: 8 U/L (ref 0–35)
Alkaline Phosphatase: 85 U/L (ref 39–117)
BUN: 15 mg/dL (ref 6–23)
CO2: 27 mEq/L (ref 19–32)
Calcium: 9.1 mg/dL (ref 8.4–10.5)
GFR calc Af Amer: 62 mL/min — ABNORMAL LOW (ref >90)
GFR calc non Af Amer: 53 mL/min — ABNORMAL LOW (ref >90)
Glucose, Bld: 97 mg/dL (ref 70–99)
Potassium: 4.1 mEq/L (ref 3.5–5.1)
Sodium: 130 mEq/L — ABNORMAL LOW (ref 135–145)
Total Protein: 6.7 g/dL (ref 6.0–8.3)

## 2012-01-15 LAB — URINALYSIS, ROUTINE W REFLEX MICROSCOPIC
Bilirubin Urine: NEGATIVE
Hgb urine dipstick: NEGATIVE
Nitrite: NEGATIVE
Protein, ur: NEGATIVE mg/dL
Urobilinogen, UA: 0.2 mg/dL (ref 0.0–1.0)

## 2012-01-15 LAB — CBC WITH DIFFERENTIAL/PLATELET
Eosinophils Absolute: 0.1 10*3/uL (ref 0.0–0.7)
Eosinophils Relative: 1 % (ref 0–5)
Hemoglobin: 9.9 g/dL — ABNORMAL LOW (ref 12.0–15.0)
Lymphocytes Relative: 25 % (ref 12–46)
Lymphs Abs: 1.8 10*3/uL (ref 0.7–4.0)
MCH: 32.1 pg (ref 26.0–34.0)
MCV: 93.5 fL (ref 78.0–100.0)
Monocytes Relative: 10 % (ref 3–12)
Platelets: 324 10*3/uL (ref 150–400)
RBC: 3.08 MIL/uL — ABNORMAL LOW (ref 3.87–5.11)
WBC: 7.4 10*3/uL (ref 4.0–10.5)

## 2012-01-15 MED ORDER — CIPROFLOXACIN HCL 500 MG PO TABS: 500.0000 mg | ORAL_TABLET | Freq: Two times a day (BID) | ORAL | Status: DC

## 2012-01-15 MED ORDER — CIPROFLOXACIN IN D5W 400 MG/200ML IV SOLN
400.0000 mg | Freq: Once | INTRAVENOUS | Status: AC
  Administered 2012-01-15: 400 mg via INTRAVENOUS
  Filled 2012-01-15: qty 200

## 2012-01-15 MED ORDER — METRONIDAZOLE IN NACL 5-0.79 MG/ML-% IV SOLN
500.0000 mg | Freq: Once | INTRAVENOUS | Status: AC
  Administered 2012-01-15: 500 mg via INTRAVENOUS
  Filled 2012-01-15: qty 100

## 2012-01-15 MED ORDER — METRONIDAZOLE 500 MG PO TABS: 500.0000 mg | ORAL_TABLET | Freq: Three times a day (TID) | ORAL | Status: DC

## 2012-01-15 MED ORDER — SODIUM CHLORIDE 0.9 % IV BOLUS (SEPSIS)
500.0000 mL | Freq: Once | INTRAVENOUS | Status: AC
  Administered 2012-01-15: 500 mL via INTRAVENOUS

## 2012-01-15 NOTE — ED Notes (Signed)
Pt reports having nagging left sided abd pain radiating around to left lower back x 2 weeks.  Reports pain started getting worse yesterday.  Reports problems with constipation.  LBM was today but was small amount.  Denies vomiting or diarrhea.  Has been nauseated.  Reports history of diverticulitis.

## 2012-01-15 NOTE — ED Notes (Signed)
Pt states that she was nauseous today before lunch, but denies it at current time. Pt denies emesis and diarrhea.

## 2012-01-15 NOTE — ED Notes (Signed)
Report called to [facility] at [facility].

## 2012-01-15 NOTE — ED Notes (Signed)
Pt care handoff incorrect. Charted under wrong patient. Pt still in room in ED

## 2012-01-15 NOTE — ED Provider Notes (Signed)
History   Scribed for Donnetta Hutching, MD, the patient was seen in room APA07/APA07 . This chart was scribed by Lewanda Rife.   CSN: [id number]  Arrival date & time 01/15/12  1502   First MD Initiated Contact with Patient 01/15/12 1531      Chief Complaint  Patient presents with  . Abdominal Pain    (Consider location/radiation/quality/duration/timing/severity/associated sxs/prior treatment) HPI [name] is a 78 y.o. female who presents to the Emergency Department complaining of constant moderate left lower quadrant abdominal pain with radiation to left flank for the past 2 weeks. Pt reports pain has increased in severity since yesterday. Pt reports associated bloating and constipation. Pt denies vomiting and diarrhea. Pt reports hx of diverticulitis.   Past Medical History  Diagnosis Date  . Hypertension   . Hypothyroidism   . Bradycardia   . Anemia   . Diverticulosis   . External hemorrhoids   . Constipation   . Macular degeneration   . Seasonal allergies   . Osteoporosis   . Degenerative joint disease   . Palpitations   . Hyperlipidemia   . GERD (gastroesophageal reflux disease)   . Pacemaker     Past Surgical History  Procedure Date  . Thyroidectomy   . Tonsillectomy   . Colonoscopy November 2011    External hemorrhoids, diverticulosis, anal papillae  . Insert / replace / remove pacemaker     Family History  Problem Relation Age of Onset  . Heart attack Mother     Deceased  . Cancer Father     Deceased  . Cancer Brother     Deceased    History  Substance Use Topics  . Smoking status: Never Smoker   . Smokeless tobacco: Never Used  . Alcohol Use: No    OB History    Grav Para Term Preterm Abortions TAB SAB Ect Mult Living                  Review of Systems  Constitutional: Negative.   HENT: Negative.   Respiratory: Negative.   Cardiovascular: Negative.   Gastrointestinal: Positive for abdominal pain.  Musculoskeletal: Negative.     Skin: Negative.   Neurological: Negative.   Hematological: Negative.   Psychiatric/Behavioral: Negative.   All other systems reviewed and are negative.    Allergies  Tramadol and Sulfa antibiotics  Home Medications   Current Outpatient Rx  Name Route Sig Dispense Refill  . ACETAMINOPHEN 325 MG PO TABS Oral Take 650 mg by mouth every 4 (four) hours as needed. For pain    . ALPRAZOLAM 0.25 MG PO TABS Oral Take 0.25 mg by mouth every 8 (eight) hours as needed. For anxiety    . AMLODIPINE BESYLATE 2.5 MG PO TABS Oral Take 2.5 mg by mouth daily.    . OCUVITE PO TABS Oral Take 1 tablet by mouth 2 (two) times daily.    Marland Kitchen CETIRIZINE HCL 10 MG PO TABS Oral Take 10 mg by mouth at bedtime.    . CYCLOSPORINE 0.05 % OP EMUL Both Eyes Place 1 drop into both eyes 2 (two) times daily.     Marland Kitchen DOCUSATE SODIUM 100 MG PO CAPS Oral Take 100 mg by mouth at bedtime.    Marland Kitchen HYDRALAZINE HCL 25 MG PO TABS Oral Take 25 mg by mouth 2 (two) times daily.      Marland Kitchen HYDROCODONE-HOMATROPINE 5-1.5 MG/5ML PO SYRP Oral Take 5 mLs by mouth every 6 (six) hours as needed. For cough    .  LEVOTHYROXINE SODIUM 100 MCG PO TABS Oral Take 100 mcg by mouth daily.    Marland Kitchen LINACLOTIDE 145 MCG PO CAPS Oral Take 145 mcg by mouth at bedtime.    Marland Kitchen LISINOPRIL 20 MG PO TABS Oral Take 20 mg by mouth 2 (two) times daily.      Marland Kitchen MAGNESIUM HYDROXIDE 400 MG/5ML PO SUSP Oral Take 30 mLs by mouth daily as needed. For constipation    . OMEPRAZOLE 20 MG PO CPDR Oral Take 20 mg by mouth daily.    Marland Kitchen PROMETHAZINE HCL 25 MG PO TABS Oral Take 25 mg by mouth every 6 (six) hours as needed. For nausea    . PSYLLIUM 95 % PO PACK Oral Take 1 packet by mouth daily.    Marland Kitchen SIMVASTATIN 40 MG PO TABS Oral Take 40 mg by mouth every evening.    Marland Kitchen TRAMADOL HCL 50 MG PO TABS Oral Take 50 mg by mouth every 8 (eight) hours as needed. For severe pain      BP 186/68  Pulse 71  Temp 98.4 F (36.9 C) (Oral)  Resp 20  Ht 5\' 5"  (1.651 m)  Wt 146 lb (66.225 kg)  BMI 24.30  kg/m2  SpO2 98%  Physical Exam  Nursing note and vitals reviewed. Constitutional: She is oriented to person, place, and time. She appears well-developed and well-nourished.  HENT:  Head: Normocephalic and atraumatic.  Eyes: Conjunctivae normal and EOM are normal. Pupils are equal, round, and reactive to light.  Neck: Normal range of motion. Neck supple.  Cardiovascular: Normal rate, regular rhythm and normal heart sounds.   Pulmonary/Chest: Effort normal and breath sounds normal.  Abdominal: Soft. Bowel sounds are normal. There is tenderness (LLQ minimal tenderness on exam ).       Minimal bloating   Musculoskeletal: Normal range of motion.  Neurological: She is alert and oriented to person, place, and time.  Skin: Skin is warm and dry.  Psychiatric: She has a normal mood and affect.    ED Course  Procedures (including critical care time) 3:48 PM  Pt informed of plan to be given antibiotics in ED for possible dx of diverticulitis. Pt consents to have blood work and urinalysis for work up today. Pt informed of plan for discharge post txt in ED.   Labs Reviewed  CBC WITH DIFFERENTIAL - Abnormal; Notable for the following:    RBC 3.08 (*)     Hemoglobin 9.9 (*)     HCT 28.8 (*)     All other components within normal limits  COMPREHENSIVE METABOLIC PANEL - Abnormal; Notable for the following:    Sodium 130 (*)     Chloride 93 (*)     GFR calc non Af Amer 53 (*)     GFR calc Af Amer 62 (*)     All other components within normal limits  URINALYSIS, ROUTINE W REFLEX MICROSCOPIC - Abnormal; Notable for the following:    Specific Gravity, Urine <1.005 (*)     All other components within normal limits  LIPASE, BLOOD   No results found.   No diagnosis found.    MDM  History and physical consistent with diverticulitis. No acute abdomen. Abdominal series series shows no free air. She had a similar bout of diverticulitis approximately one month ago. Rx IV Cipro and Flagyl in the  emergency room. Discharge him on by mouth    I personally performed the services described in this documentation, which was scribed in my presence. The  recorded information has been reviewed and considered.     Donnetta Hutching, MD 01/18/12 1007

## 2012-01-30 DIAGNOSIS — H35359 Cystoid macular degeneration, unspecified eye: Secondary | ICD-10-CM | POA: Diagnosis not present

## 2012-01-30 DIAGNOSIS — H353290 Exudative age-related macular degeneration, unspecified eye, stage unspecified: Secondary | ICD-10-CM | POA: Diagnosis not present

## 2012-01-30 DIAGNOSIS — H35059 Retinal neovascularization, unspecified, unspecified eye: Secondary | ICD-10-CM | POA: Diagnosis not present

## 2012-02-21 DIAGNOSIS — L299 Pruritus, unspecified: Secondary | ICD-10-CM | POA: Diagnosis not present

## 2012-02-21 DIAGNOSIS — L820 Inflamed seborrheic keratosis: Secondary | ICD-10-CM | POA: Diagnosis not present

## 2012-03-18 DIAGNOSIS — R5381 Other malaise: Secondary | ICD-10-CM | POA: Diagnosis not present

## 2012-03-18 DIAGNOSIS — E785 Hyperlipidemia, unspecified: Secondary | ICD-10-CM | POA: Diagnosis not present

## 2012-03-18 DIAGNOSIS — I10 Essential (primary) hypertension: Secondary | ICD-10-CM | POA: Diagnosis not present

## 2012-03-18 DIAGNOSIS — K5900 Constipation, unspecified: Secondary | ICD-10-CM | POA: Diagnosis not present

## 2012-03-19 DIAGNOSIS — H35359 Cystoid macular degeneration, unspecified eye: Secondary | ICD-10-CM | POA: Diagnosis not present

## 2012-03-19 DIAGNOSIS — H353290 Exudative age-related macular degeneration, unspecified eye, stage unspecified: Secondary | ICD-10-CM | POA: Diagnosis not present

## 2012-03-19 DIAGNOSIS — H35059 Retinal neovascularization, unspecified, unspecified eye: Secondary | ICD-10-CM | POA: Diagnosis not present

## 2012-04-10 DIAGNOSIS — S058X9A Other injuries of unspecified eye and orbit, initial encounter: Secondary | ICD-10-CM | POA: Diagnosis not present

## 2012-05-14 DIAGNOSIS — H353290 Exudative age-related macular degeneration, unspecified eye, stage unspecified: Secondary | ICD-10-CM | POA: Diagnosis not present

## 2012-05-14 DIAGNOSIS — H35359 Cystoid macular degeneration, unspecified eye: Secondary | ICD-10-CM | POA: Diagnosis not present

## 2012-05-14 DIAGNOSIS — H35059 Retinal neovascularization, unspecified, unspecified eye: Secondary | ICD-10-CM | POA: Diagnosis not present

## 2012-05-20 DIAGNOSIS — E039 Hypothyroidism, unspecified: Secondary | ICD-10-CM | POA: Diagnosis not present

## 2012-05-21 DIAGNOSIS — E039 Hypothyroidism, unspecified: Secondary | ICD-10-CM | POA: Diagnosis not present

## 2012-06-09 ENCOUNTER — Encounter: Payer: Self-pay | Admitting: Internal Medicine

## 2012-06-09 ENCOUNTER — Ambulatory Visit (INDEPENDENT_AMBULATORY_CARE_PROVIDER_SITE_OTHER): Payer: Medicare Other | Admitting: Internal Medicine

## 2012-06-09 VITALS — BP 150/62 | HR 73 | Ht 65.5 in | Wt 146.0 lb

## 2012-06-09 DIAGNOSIS — I10 Essential (primary) hypertension: Secondary | ICD-10-CM

## 2012-06-09 DIAGNOSIS — I498 Other specified cardiac arrhythmias: Secondary | ICD-10-CM

## 2012-06-09 DIAGNOSIS — R001 Bradycardia, unspecified: Secondary | ICD-10-CM

## 2012-06-09 LAB — PACEMAKER DEVICE OBSERVATION
AL AMPLITUDE: 3.3 mv
AL IMPEDENCE PM: 387.5 Ohm
ATRIAL PACING PM: 31
BATTERY VOLTAGE: 2.9629 V
RV LEAD IMPEDENCE PM: 450 Ohm
VENTRICULAR PACING PM: 1

## 2012-06-09 MED ORDER — AMLODIPINE BESYLATE 10 MG PO TABS: 5.0000 mg | ORAL_TABLET | Freq: Every day | ORAL | Status: DC

## 2012-06-09 NOTE — Assessment & Plan Note (Signed)
Her blood pressure remains elevated. When she checks it at home, the systolic pressure has been up. I recommended that she increase her dose of amlodipine from 2.5 mg daily to 5 mg daily. I recommended that she followup with her primary physician, Dr. Margo Aye, for additional blood pressure medication titration.

## 2012-06-09 NOTE — Progress Notes (Signed)
HPI  [name] returns today for followup. She is a very pleasant 79 year old woman with a history of hypertension, palpitations, symptomatic bradycardia, status post permanent pacemaker insertion. In the interim, she has done well. She notes that she has occaisional episodes of chest pain lasting seconds, not related to exerertion, and not associated with palpitations. Allergies  Allergen Reactions  . Tramadol Itching and Nausea Only  . Sulfa Antibiotics Nausea And Vomiting     Current Outpatient Prescriptions  Medication Sig Dispense Refill  . acetaminophen (TYLENOL) 325 MG tablet Take 650 mg by mouth every 4 (four) hours as needed. For pain      . ALPRAZolam (XANAX) 0.25 MG tablet Take 0.25 mg by mouth every 8 (eight) hours as needed. For anxiety      . beta carotene w/minerals (OCUVITE) tablet Take 1 tablet by mouth 2 (two) times daily.      . cycloSPORINE (RESTASIS) 0.05 % ophthalmic emulsion Place 1 drop into both eyes 2 (two) times daily.       Marland Kitchen docusate sodium (COLACE) 100 MG capsule Take 100 mg by mouth at bedtime.      . fexofenadine (ALLEGRA) 180 MG tablet Take 180 mg by mouth daily.      . fluticasone (FLONASE) 50 MCG/ACT nasal spray Place 2 sprays into the nose daily as needed. For allergies      . hydrALAZINE (APRESOLINE) 25 MG tablet Take 25 mg by mouth 2 (two) times daily.        Marland Kitchen levothyroxine (SYNTHROID, LEVOTHROID) 100 MCG tablet Take 100 mcg by mouth daily.      . Linaclotide (LINZESS) 145 MCG CAPS Take 145 mcg by mouth 2 (two) times daily. As needed      . lisinopril (PRINIVIL,ZESTRIL) 20 MG tablet Take 20 mg by mouth 2 (two) times daily.        Marland Kitchen ofloxacin (OCUFLOX) 0.3 % ophthalmic solution Place 1 drop into the left eye 3 (three) times daily.      . polyethylene glycol powder (GLYCOLAX/MIRALAX) powder Take 17 g by mouth daily as needed. For constipation. *One capful to be mixed in water or juice*      . promethazine (PHENERGAN) 25 MG tablet Take 25 mg by mouth every  6 (six) hours as needed. For nausea      . psyllium (HYDROCIL/METAMUCIL) 95 % PACK Take 1 packet by mouth daily.      Marland Kitchen tobramycin-dexamethasone (TOBRADEX) ophthalmic ointment Place 1 application into the left eye 2 (two) times daily. 1/4 inch strip bid as needed to left eyelid      . amLODipine (NORVASC) 2.5 MG tablet Take 2.5 mg by mouth daily.       No current facility-administered medications for this visit.     Past Medical History  Diagnosis Date  . Hypertension   . Hypothyroidism   . Bradycardia   . Anemia   . Diverticulosis   . External hemorrhoids   . Constipation   . Macular degeneration   . Seasonal allergies   . Osteoporosis   . Degenerative joint disease   . Palpitations   . Hyperlipidemia   . GERD (gastroesophageal reflux disease)   . Pacemaker     ROS:   All systems reviewed and negative except as noted in the HPI.   Past Surgical History  Procedure Laterality Date  . Thyroidectomy    . Tonsillectomy    . Colonoscopy  November 2011    External hemorrhoids, diverticulosis, anal papillae  .  Insert / replace / remove pacemaker       Family History  Problem Relation Age of Onset  . Heart attack Mother     Deceased  . Cancer Father     Deceased  . Cancer Brother     Deceased     History   Social History  . Marital Status: Widowed    Spouse Name: N/A    Number of Children: N/A  . Years of Education: N/A   Occupational History  . Not on file.   Social History Main Topics  . Smoking status: Never Smoker   . Smokeless tobacco: Never Used  . Alcohol Use: No  . Drug Use: No  . Sexually Active: Not on file   Other Topics Concern  . Not on file   Social History Narrative  . No narrative on file     BP 150/62  Pulse 73  Ht 5' 5.5" (1.664 m)  Wt 146 lb (66.225 kg)  BMI 23.92 kg/m2  Physical Exam:  Well appearing 79 year old woman,NAD HEENT: Unremarkable Neck:  7 cm JVD, no thyromegally Lungs:  Clear with no wheezes, rales, or  rhonchi. HEART:  Regular rate rhythm, no murmurs, no rubs, no clicks Abd:  soft, positive bowel sounds, no organomegally, no rebound, no guarding Ext:  2 plus pulses, no edema, no cyanosis, no clubbing Skin:  No rashes no nodules Neuro:  CN II through XII intact, motor grossly intact  DEVICE  Normal device function.  See PaceArt for details.   Assess/Plan:

## 2012-06-09 NOTE — Assessment & Plan Note (Signed)
Her [employer] Jude dual-chamber pacemaker is working normally. We'll plan to recheck in several months. 

## 2012-06-09 NOTE — Patient Instructions (Addendum)
Your physician recommends that you schedule a follow-up appointment in: ONE YEAR WITH [facility]  YOUR DEVICE WILL BE CHECKED REMOTELY ON MAY 19-2014, YOU WILL NOT NEED TO DO ANYTHING AT THAT TIME   Your physician has recommended you make the following change in your medication:   1) INCREASE AMLODIPINE (NORVASC) TO 5MG  DAILY, TAKE HALF TAB OF 10MG  TAB SENT IN PER INSURANCE PREFERENCE

## 2012-06-09 NOTE — Assessment & Plan Note (Signed)
I suspect these episodes are related to her nonsustained atrial tachycardia which he has for a few seconds in duration and are rare in occurrence. I've reassured the patient. If these episodes were to increase in frequency, we would consider adding a beta blocker.

## 2012-07-07 ENCOUNTER — Telehealth: Payer: Self-pay | Admitting: *Deleted

## 2012-07-07 NOTE — Telephone Encounter (Signed)
Noted incoming request from pt pharmacy for refill, researched pt chart and sent medication requested via escribe for amlodipine 10mg  15tabs, take 1/2 tab (5mg ) by mouth once daily, noted this RX was previously sent for pt on 06-09-12 for #90 with 1 refill, refill too soon, will disregard per pt has sufficient supply

## 2012-07-09 DIAGNOSIS — M7150 Other bursitis, not elsewhere classified, unspecified site: Secondary | ICD-10-CM | POA: Diagnosis not present

## 2012-07-15 DIAGNOSIS — H353290 Exudative age-related macular degeneration, unspecified eye, stage unspecified: Secondary | ICD-10-CM | POA: Diagnosis not present

## 2012-07-15 DIAGNOSIS — H35059 Retinal neovascularization, unspecified, unspecified eye: Secondary | ICD-10-CM | POA: Diagnosis not present

## 2012-07-18 DIAGNOSIS — M76899 Other specified enthesopathies of unspecified lower limb, excluding foot: Secondary | ICD-10-CM | POA: Diagnosis not present

## 2012-07-18 DIAGNOSIS — R262 Difficulty in walking, not elsewhere classified: Secondary | ICD-10-CM | POA: Diagnosis not present

## 2012-07-18 DIAGNOSIS — M5430 Sciatica, unspecified side: Secondary | ICD-10-CM | POA: Diagnosis not present

## 2012-07-18 DIAGNOSIS — M25559 Pain in unspecified hip: Secondary | ICD-10-CM | POA: Diagnosis not present

## 2012-07-21 DIAGNOSIS — M76899 Other specified enthesopathies of unspecified lower limb, excluding foot: Secondary | ICD-10-CM | POA: Diagnosis not present

## 2012-07-21 DIAGNOSIS — M5430 Sciatica, unspecified side: Secondary | ICD-10-CM | POA: Diagnosis not present

## 2012-07-21 DIAGNOSIS — M25559 Pain in unspecified hip: Secondary | ICD-10-CM | POA: Diagnosis not present

## 2012-07-21 DIAGNOSIS — R262 Difficulty in walking, not elsewhere classified: Secondary | ICD-10-CM | POA: Diagnosis not present

## 2012-07-22 DIAGNOSIS — M25559 Pain in unspecified hip: Secondary | ICD-10-CM | POA: Diagnosis not present

## 2012-07-22 DIAGNOSIS — M5430 Sciatica, unspecified side: Secondary | ICD-10-CM | POA: Diagnosis not present

## 2012-07-22 DIAGNOSIS — M76899 Other specified enthesopathies of unspecified lower limb, excluding foot: Secondary | ICD-10-CM | POA: Diagnosis not present

## 2012-07-22 DIAGNOSIS — R262 Difficulty in walking, not elsewhere classified: Secondary | ICD-10-CM | POA: Diagnosis not present

## 2012-08-13 DIAGNOSIS — L600 Ingrowing nail: Secondary | ICD-10-CM | POA: Diagnosis not present

## 2012-08-13 DIAGNOSIS — M79609 Pain in unspecified limb: Secondary | ICD-10-CM | POA: Diagnosis not present

## 2012-08-22 DIAGNOSIS — M76899 Other specified enthesopathies of unspecified lower limb, excluding foot: Secondary | ICD-10-CM | POA: Diagnosis not present

## 2012-08-22 DIAGNOSIS — R262 Difficulty in walking, not elsewhere classified: Secondary | ICD-10-CM | POA: Diagnosis not present

## 2012-08-22 DIAGNOSIS — M5430 Sciatica, unspecified side: Secondary | ICD-10-CM | POA: Diagnosis not present

## 2012-08-22 DIAGNOSIS — M25559 Pain in unspecified hip: Secondary | ICD-10-CM | POA: Diagnosis not present

## 2012-08-25 DIAGNOSIS — M76899 Other specified enthesopathies of unspecified lower limb, excluding foot: Secondary | ICD-10-CM | POA: Diagnosis not present

## 2012-08-25 DIAGNOSIS — R262 Difficulty in walking, not elsewhere classified: Secondary | ICD-10-CM | POA: Diagnosis not present

## 2012-08-25 DIAGNOSIS — M25559 Pain in unspecified hip: Secondary | ICD-10-CM | POA: Diagnosis not present

## 2012-08-25 DIAGNOSIS — M5430 Sciatica, unspecified side: Secondary | ICD-10-CM | POA: Diagnosis not present

## 2012-08-27 DIAGNOSIS — M76899 Other specified enthesopathies of unspecified lower limb, excluding foot: Secondary | ICD-10-CM | POA: Diagnosis not present

## 2012-08-27 DIAGNOSIS — R262 Difficulty in walking, not elsewhere classified: Secondary | ICD-10-CM | POA: Diagnosis not present

## 2012-08-27 DIAGNOSIS — M5430 Sciatica, unspecified side: Secondary | ICD-10-CM | POA: Diagnosis not present

## 2012-08-27 DIAGNOSIS — M25559 Pain in unspecified hip: Secondary | ICD-10-CM | POA: Diagnosis not present

## 2012-08-28 ENCOUNTER — Other Ambulatory Visit: Payer: Self-pay | Admitting: *Deleted

## 2012-08-28 DIAGNOSIS — R262 Difficulty in walking, not elsewhere classified: Secondary | ICD-10-CM | POA: Diagnosis not present

## 2012-08-28 DIAGNOSIS — M76899 Other specified enthesopathies of unspecified lower limb, excluding foot: Secondary | ICD-10-CM | POA: Diagnosis not present

## 2012-08-28 DIAGNOSIS — M25559 Pain in unspecified hip: Secondary | ICD-10-CM | POA: Diagnosis not present

## 2012-08-28 DIAGNOSIS — I10 Essential (primary) hypertension: Secondary | ICD-10-CM

## 2012-08-28 DIAGNOSIS — M5430 Sciatica, unspecified side: Secondary | ICD-10-CM | POA: Diagnosis not present

## 2012-08-28 MED ORDER — AMLODIPINE BESYLATE 10 MG PO TABS
5.0000 mg | ORAL_TABLET | Freq: Every day | ORAL | Status: DC
Start: 2012-08-28 — End: 2012-12-01

## 2012-09-04 DIAGNOSIS — M549 Dorsalgia, unspecified: Secondary | ICD-10-CM | POA: Diagnosis not present

## 2012-09-04 DIAGNOSIS — IMO0002 Reserved for concepts with insufficient information to code with codable children: Secondary | ICD-10-CM | POA: Diagnosis not present

## 2012-09-04 DIAGNOSIS — M5430 Sciatica, unspecified side: Secondary | ICD-10-CM | POA: Diagnosis not present

## 2012-09-05 ENCOUNTER — Other Ambulatory Visit (HOSPITAL_COMMUNITY): Payer: Self-pay | Admitting: Internal Medicine

## 2012-09-05 DIAGNOSIS — M549 Dorsalgia, unspecified: Secondary | ICD-10-CM

## 2012-09-05 DIAGNOSIS — M5410 Radiculopathy, site unspecified: Secondary | ICD-10-CM

## 2012-09-08 ENCOUNTER — Ambulatory Visit (HOSPITAL_COMMUNITY)
Admission: RE | Admit: 2012-09-08 | Discharge: 2012-09-08 | Disposition: A | Discharge status: Home / Self Care | Payer: Medicare Other | Source: Ambulatory Visit | Attending: Internal Medicine | Admitting: Internal Medicine

## 2012-09-08 ENCOUNTER — Ambulatory Visit (INDEPENDENT_AMBULATORY_CARE_PROVIDER_SITE_OTHER): Payer: Medicare Other | Admitting: *Deleted

## 2012-09-08 ENCOUNTER — Encounter (HOSPITAL_COMMUNITY): Payer: Self-pay

## 2012-09-08 ENCOUNTER — Encounter: Payer: Self-pay | Admitting: Internal Medicine

## 2012-09-08 DIAGNOSIS — M25559 Pain in unspecified hip: Secondary | ICD-10-CM | POA: Insufficient documentation

## 2012-09-08 DIAGNOSIS — M545 Low back pain, unspecified: Secondary | ICD-10-CM | POA: Diagnosis not present

## 2012-09-08 DIAGNOSIS — Z950 Presence of cardiac pacemaker: Secondary | ICD-10-CM

## 2012-09-08 DIAGNOSIS — I498 Other specified cardiac arrhythmias: Secondary | ICD-10-CM

## 2012-09-08 DIAGNOSIS — M5137 Other intervertebral disc degeneration, lumbosacral region: Secondary | ICD-10-CM | POA: Diagnosis not present

## 2012-09-08 DIAGNOSIS — M549 Dorsalgia, unspecified: Secondary | ICD-10-CM

## 2012-09-08 DIAGNOSIS — R001 Bradycardia, unspecified: Secondary | ICD-10-CM

## 2012-09-08 DIAGNOSIS — M47817 Spondylosis without myelopathy or radiculopathy, lumbosacral region: Secondary | ICD-10-CM | POA: Insufficient documentation

## 2012-09-08 DIAGNOSIS — M48061 Spinal stenosis, lumbar region without neurogenic claudication: Secondary | ICD-10-CM | POA: Diagnosis not present

## 2012-09-08 DIAGNOSIS — M5410 Radiculopathy, site unspecified: Secondary | ICD-10-CM

## 2012-09-08 LAB — REMOTE PACEMAKER DEVICE
AL IMPEDENCE PM: 400 Ohm
BAMS-0003: 70 {beats}/min
BRDY-0003RV: 110 {beats}/min
DEVICE MODEL PM: 7316836
RV LEAD AMPLITUDE: 12 mv
RV LEAD THRESHOLD: 0.875 V

## 2012-09-11 ENCOUNTER — Encounter: Payer: Self-pay | Admitting: *Deleted

## 2012-09-16 DIAGNOSIS — H35059 Retinal neovascularization, unspecified, unspecified eye: Secondary | ICD-10-CM | POA: Diagnosis not present

## 2012-09-16 DIAGNOSIS — H353290 Exudative age-related macular degeneration, unspecified eye, stage unspecified: Secondary | ICD-10-CM | POA: Diagnosis not present

## 2012-09-16 DIAGNOSIS — H35359 Cystoid macular degeneration, unspecified eye: Secondary | ICD-10-CM | POA: Diagnosis not present

## 2012-09-19 DIAGNOSIS — H6120 Impacted cerumen, unspecified ear: Secondary | ICD-10-CM | POA: Diagnosis not present

## 2012-09-19 DIAGNOSIS — H905 Unspecified sensorineural hearing loss: Secondary | ICD-10-CM | POA: Diagnosis not present

## 2012-11-05 DIAGNOSIS — M545 Low back pain: Secondary | ICD-10-CM | POA: Diagnosis not present

## 2012-11-05 DIAGNOSIS — IMO0002 Reserved for concepts with insufficient information to code with codable children: Secondary | ICD-10-CM | POA: Diagnosis not present

## 2012-11-05 DIAGNOSIS — M48062 Spinal stenosis, lumbar region with neurogenic claudication: Secondary | ICD-10-CM | POA: Diagnosis not present

## 2012-11-05 DIAGNOSIS — G894 Chronic pain syndrome: Secondary | ICD-10-CM | POA: Diagnosis not present

## 2012-11-11 DIAGNOSIS — I10 Essential (primary) hypertension: Secondary | ICD-10-CM | POA: Diagnosis not present

## 2012-11-11 DIAGNOSIS — R5381 Other malaise: Secondary | ICD-10-CM | POA: Diagnosis not present

## 2012-11-11 DIAGNOSIS — E039 Hypothyroidism, unspecified: Secondary | ICD-10-CM | POA: Diagnosis not present

## 2012-11-11 DIAGNOSIS — R5383 Other fatigue: Secondary | ICD-10-CM | POA: Diagnosis not present

## 2012-11-11 DIAGNOSIS — E785 Hyperlipidemia, unspecified: Secondary | ICD-10-CM | POA: Diagnosis not present

## 2012-11-15 DIAGNOSIS — N390 Urinary tract infection, site not specified: Secondary | ICD-10-CM | POA: Diagnosis not present

## 2012-11-25 DIAGNOSIS — M546 Pain in thoracic spine: Secondary | ICD-10-CM | POA: Diagnosis not present

## 2012-11-25 DIAGNOSIS — M76899 Other specified enthesopathies of unspecified lower limb, excluding foot: Secondary | ICD-10-CM | POA: Diagnosis not present

## 2012-11-25 DIAGNOSIS — IMO0002 Reserved for concepts with insufficient information to code with codable children: Secondary | ICD-10-CM | POA: Diagnosis not present

## 2012-11-25 DIAGNOSIS — G894 Chronic pain syndrome: Secondary | ICD-10-CM | POA: Diagnosis not present

## 2012-12-01 ENCOUNTER — Other Ambulatory Visit: Payer: Self-pay | Admitting: *Deleted

## 2012-12-01 DIAGNOSIS — I10 Essential (primary) hypertension: Secondary | ICD-10-CM

## 2012-12-01 MED ORDER — AMLODIPINE BESYLATE 10 MG PO TABS: 5.0000 mg | ORAL_TABLET | Freq: Every day | ORAL | Status: DC

## 2012-12-08 ENCOUNTER — Encounter: Payer: Self-pay | Admitting: Internal Medicine

## 2012-12-08 ENCOUNTER — Ambulatory Visit: Payer: Medicare Other | Admitting: *Deleted

## 2012-12-09 DIAGNOSIS — H35359 Cystoid macular degeneration, unspecified eye: Secondary | ICD-10-CM | POA: Diagnosis not present

## 2012-12-09 DIAGNOSIS — H353190 Nonexudative age-related macular degeneration, unspecified eye, stage unspecified: Secondary | ICD-10-CM | POA: Diagnosis not present

## 2012-12-09 DIAGNOSIS — H35059 Retinal neovascularization, unspecified, unspecified eye: Secondary | ICD-10-CM | POA: Diagnosis not present

## 2012-12-09 DIAGNOSIS — H35369 Drusen (degenerative) of macula, unspecified eye: Secondary | ICD-10-CM | POA: Diagnosis not present

## 2012-12-09 LAB — REMOTE PACEMAKER DEVICE
AL AMPLITUDE: 3.6 mv
AL IMPEDENCE PM: 400 Ohm
BAMS-0001: 150 {beats}/min
BATTERY VOLTAGE: 2.96 V
DEVICE MODEL PM: 7316836
RV LEAD AMPLITUDE: 12 mv

## 2012-12-12 ENCOUNTER — Encounter: Payer: Self-pay | Admitting: *Deleted

## 2012-12-16 DIAGNOSIS — K5900 Constipation, unspecified: Secondary | ICD-10-CM | POA: Diagnosis not present

## 2012-12-16 DIAGNOSIS — R109 Unspecified abdominal pain: Secondary | ICD-10-CM | POA: Diagnosis not present

## 2012-12-17 ENCOUNTER — Other Ambulatory Visit (HOSPITAL_COMMUNITY): Payer: Self-pay | Admitting: Internal Medicine

## 2012-12-17 DIAGNOSIS — R109 Unspecified abdominal pain: Secondary | ICD-10-CM

## 2012-12-17 DIAGNOSIS — R140 Abdominal distension (gaseous): Secondary | ICD-10-CM

## 2012-12-23 ENCOUNTER — Ambulatory Visit (HOSPITAL_COMMUNITY)
Admission: RE | Admit: 2012-12-23 | Discharge: 2012-12-23 | Disposition: A | Discharge status: Home / Self Care | Payer: Medicare Other | Source: Ambulatory Visit | Attending: Internal Medicine | Admitting: Internal Medicine

## 2012-12-23 DIAGNOSIS — R109 Unspecified abdominal pain: Secondary | ICD-10-CM | POA: Insufficient documentation

## 2012-12-23 DIAGNOSIS — R142 Eructation: Secondary | ICD-10-CM | POA: Insufficient documentation

## 2012-12-23 DIAGNOSIS — R141 Gas pain: Secondary | ICD-10-CM | POA: Insufficient documentation

## 2012-12-23 DIAGNOSIS — R140 Abdominal distension (gaseous): Secondary | ICD-10-CM

## 2012-12-27 ENCOUNTER — Emergency Department (HOSPITAL_COMMUNITY): Payer: Medicare Other

## 2012-12-27 ENCOUNTER — Inpatient Hospital Stay (HOSPITAL_COMMUNITY)
Admission: EM | Admit: 2012-12-27 | Discharge: 2013-01-01 | DRG: 481 | Disposition: A | Discharge status: Skilled Nursing Facility | Payer: Medicare Other | Attending: Internal Medicine | Admitting: Internal Medicine

## 2012-12-27 ENCOUNTER — Encounter (HOSPITAL_COMMUNITY): Payer: Self-pay

## 2012-12-27 DIAGNOSIS — K5900 Constipation, unspecified: Secondary | ICD-10-CM

## 2012-12-27 DIAGNOSIS — E785 Hyperlipidemia, unspecified: Secondary | ICD-10-CM | POA: Diagnosis present

## 2012-12-27 DIAGNOSIS — R0789 Other chest pain: Secondary | ICD-10-CM

## 2012-12-27 DIAGNOSIS — T148XXA Other injury of unspecified body region, initial encounter: Secondary | ICD-10-CM | POA: Diagnosis not present

## 2012-12-27 DIAGNOSIS — M25559 Pain in unspecified hip: Secondary | ICD-10-CM | POA: Diagnosis present

## 2012-12-27 DIAGNOSIS — E039 Hypothyroidism, unspecified: Secondary | ICD-10-CM | POA: Diagnosis not present

## 2012-12-27 DIAGNOSIS — I10 Essential (primary) hypertension: Secondary | ICD-10-CM | POA: Diagnosis not present

## 2012-12-27 DIAGNOSIS — M1990 Unspecified osteoarthritis, unspecified site: Secondary | ICD-10-CM | POA: Diagnosis present

## 2012-12-27 DIAGNOSIS — Z9181 History of falling: Secondary | ICD-10-CM | POA: Diagnosis not present

## 2012-12-27 DIAGNOSIS — N289 Disorder of kidney and ureter, unspecified: Secondary | ICD-10-CM

## 2012-12-27 DIAGNOSIS — M810 Age-related osteoporosis without current pathological fracture: Secondary | ICD-10-CM | POA: Diagnosis present

## 2012-12-27 DIAGNOSIS — D62 Acute posthemorrhagic anemia: Secondary | ICD-10-CM | POA: Diagnosis not present

## 2012-12-27 DIAGNOSIS — S72009A Fracture of unspecified part of neck of unspecified femur, initial encounter for closed fracture: Secondary | ICD-10-CM | POA: Diagnosis not present

## 2012-12-27 DIAGNOSIS — S72009D Fracture of unspecified part of neck of unspecified femur, subsequent encounter for closed fracture with routine healing: Secondary | ICD-10-CM | POA: Diagnosis not present

## 2012-12-27 DIAGNOSIS — S72002D Fracture of unspecified part of neck of left femur, subsequent encounter for closed fracture with routine healing: Secondary | ICD-10-CM

## 2012-12-27 DIAGNOSIS — K219 Gastro-esophageal reflux disease without esophagitis: Secondary | ICD-10-CM | POA: Diagnosis not present

## 2012-12-27 DIAGNOSIS — R262 Difficulty in walking, not elsewhere classified: Secondary | ICD-10-CM | POA: Diagnosis not present

## 2012-12-27 DIAGNOSIS — S72143A Displaced intertrochanteric fracture of unspecified femur, initial encounter for closed fracture: Secondary | ICD-10-CM | POA: Diagnosis not present

## 2012-12-27 DIAGNOSIS — E871 Hypo-osmolality and hyponatremia: Secondary | ICD-10-CM

## 2012-12-27 DIAGNOSIS — R001 Bradycardia, unspecified: Secondary | ICD-10-CM

## 2012-12-27 DIAGNOSIS — Y92009 Unspecified place in unspecified non-institutional (private) residence as the place of occurrence of the external cause: Secondary | ICD-10-CM | POA: Diagnosis not present

## 2012-12-27 DIAGNOSIS — R002 Palpitations: Secondary | ICD-10-CM

## 2012-12-27 DIAGNOSIS — K529 Noninfective gastroenteritis and colitis, unspecified: Secondary | ICD-10-CM

## 2012-12-27 DIAGNOSIS — W010XXA Fall on same level from slipping, tripping and stumbling without subsequent striking against object, initial encounter: Secondary | ICD-10-CM | POA: Diagnosis present

## 2012-12-27 DIAGNOSIS — I498 Other specified cardiac arrhythmias: Secondary | ICD-10-CM | POA: Diagnosis not present

## 2012-12-27 DIAGNOSIS — E7800 Pure hypercholesterolemia, unspecified: Secondary | ICD-10-CM

## 2012-12-27 DIAGNOSIS — M6281 Muscle weakness (generalized): Secondary | ICD-10-CM | POA: Diagnosis not present

## 2012-12-27 DIAGNOSIS — D649 Anemia, unspecified: Secondary | ICD-10-CM

## 2012-12-27 DIAGNOSIS — R10816 Epigastric abdominal tenderness: Secondary | ICD-10-CM

## 2012-12-27 DIAGNOSIS — M79609 Pain in unspecified limb: Secondary | ICD-10-CM | POA: Diagnosis not present

## 2012-12-27 DIAGNOSIS — Z950 Presence of cardiac pacemaker: Secondary | ICD-10-CM | POA: Diagnosis not present

## 2012-12-27 DIAGNOSIS — R1310 Dysphagia, unspecified: Secondary | ICD-10-CM

## 2012-12-27 DIAGNOSIS — R0989 Other specified symptoms and signs involving the circulatory and respiratory systems: Secondary | ICD-10-CM | POA: Diagnosis not present

## 2012-12-27 DIAGNOSIS — I5032 Chronic diastolic (congestive) heart failure: Secondary | ICD-10-CM | POA: Diagnosis present

## 2012-12-27 DIAGNOSIS — S72002A Fracture of unspecified part of neck of left femur, initial encounter for closed fracture: Secondary | ICD-10-CM

## 2012-12-27 LAB — URINALYSIS, ROUTINE W REFLEX MICROSCOPIC
Bilirubin Urine: NEGATIVE
Glucose, UA: NEGATIVE mg/dL
Hgb urine dipstick: NEGATIVE
Ketones, ur: NEGATIVE mg/dL
Leukocytes, UA: NEGATIVE
Nitrite: NEGATIVE
Protein, ur: NEGATIVE mg/dL
Specific Gravity, Urine: 1.02 (ref 1.005–1.030)
Urobilinogen, UA: 0.2 mg/dL (ref 0.0–1.0)
pH: 6 (ref 5.0–8.0)

## 2012-12-27 LAB — CBC WITH DIFFERENTIAL/PLATELET
Basophils Absolute: 0 10*3/uL (ref 0.0–0.1)
Basophils Relative: 0 % (ref 0–1)
Eosinophils Absolute: 0 10*3/uL (ref 0.0–0.7)
Eosinophils Relative: 1 % (ref 0–5)
HCT: 33.2 % — ABNORMAL LOW (ref 36.0–46.0)
Hemoglobin: 11.5 g/dL — ABNORMAL LOW (ref 12.0–15.0)
Lymphocytes Relative: 43 % (ref 12–46)
Lymphs Abs: 2.8 10*3/uL (ref 0.7–4.0)
MCH: 33.4 pg (ref 26.0–34.0)
MCHC: 34.6 g/dL (ref 30.0–36.0)
MCV: 96.5 fL (ref 78.0–100.0)
Monocytes Absolute: 0.6 10*3/uL (ref 0.1–1.0)
Monocytes Relative: 9 % (ref 3–12)
Neutro Abs: 3 10*3/uL (ref 1.7–7.7)
Neutrophils Relative %: 47 % (ref 43–77)
Platelets: 324 10*3/uL (ref 150–400)
RBC: 3.44 MIL/uL — ABNORMAL LOW (ref 3.87–5.11)
RDW: 16 % — ABNORMAL HIGH (ref 11.5–15.5)
WBC: 6.4 10*3/uL (ref 4.0–10.5)

## 2012-12-27 LAB — BASIC METABOLIC PANEL
BUN: 16 mg/dL (ref 6–23)
CO2: 24 mEq/L (ref 19–32)
Calcium: 9.2 mg/dL (ref 8.4–10.5)
Chloride: 93 mEq/L — ABNORMAL LOW (ref 96–112)
Creatinine, Ser: 1.09 mg/dL (ref 0.50–1.10)
GFR calc Af Amer: 51 mL/min — ABNORMAL LOW (ref >90)
GFR calc non Af Amer: 44 mL/min — ABNORMAL LOW (ref >90)
Glucose, Bld: 173 mg/dL — ABNORMAL HIGH (ref 70–99)
Potassium: 4.2 mEq/L (ref 3.5–5.1)
Sodium: 128 mEq/L — ABNORMAL LOW (ref 135–145)

## 2012-12-27 LAB — CREATININE, SERUM
Creatinine, Ser: 1.19 mg/dL — ABNORMAL HIGH (ref 0.50–1.10)
GFR calc Af Amer: 46 mL/min — ABNORMAL LOW (ref >90)

## 2012-12-27 LAB — CBC
Hemoglobin: 7.4 g/dL — ABNORMAL LOW (ref 12.0–15.0)
MCH: 29.8 pg (ref 26.0–34.0)
MCV: 89.5 fL (ref 78.0–100.0)
RBC: 2.48 MIL/uL — ABNORMAL LOW (ref 3.87–5.11)
WBC: 12.9 10*3/uL — ABNORMAL HIGH (ref 4.0–10.5)

## 2012-12-27 MED ORDER — FLUTICASONE PROPIONATE 50 MCG/ACT NA SUSP
2.0000 | Freq: Every day | NASAL | Status: DC | PRN
  Filled 2012-12-27: qty 16

## 2012-12-27 MED ORDER — CYCLOSPORINE 0.05 % OP EMUL
1.0000 [drp] | Freq: Two times a day (BID) | OPHTHALMIC | Status: DC
  Administered 2012-12-28: 23:00:00 via OPHTHALMIC
  Administered 2012-12-28 – 2012-12-29 (×3): 1 [drp] via OPHTHALMIC
  Filled 2012-12-27 (×5): qty 1

## 2012-12-27 MED ORDER — ALPRAZOLAM 0.25 MG PO TABS: 0.2500 mg | ORAL_TABLET | Freq: Three times a day (TID) | ORAL | Status: DC | PRN

## 2012-12-27 MED ORDER — SODIUM CHLORIDE 0.9 % IV SOLN
INTRAVENOUS | Status: AC
  Administered 2012-12-28 (×2): 50 mL/h via INTRAVENOUS

## 2012-12-27 MED ORDER — HYDRALAZINE HCL 25 MG PO TABS
25.0000 mg | ORAL_TABLET | Freq: Two times a day (BID) | ORAL | Status: DC
  Administered 2012-12-28 – 2012-12-29 (×2): 25 mg via ORAL
  Filled 2012-12-27 (×5): qty 1

## 2012-12-27 MED ORDER — MORPHINE SULFATE 2 MG/ML IJ SOLN
0.5000 mg | INTRAMUSCULAR | Status: DC | PRN
  Administered 2012-12-27: 0.5 mg via INTRAVENOUS
  Filled 2012-12-27: qty 1

## 2012-12-27 MED ORDER — METHOCARBAMOL 500 MG PO TABS
500.0000 mg | ORAL_TABLET | Freq: Four times a day (QID) | ORAL | Status: DC | PRN
  Administered 2012-12-28 – 2012-12-29 (×3): 500 mg via ORAL
  Filled 2012-12-27 (×3): qty 1

## 2012-12-27 MED ORDER — DIAZEPAM 5 MG/ML IJ SOLN
2.5000 mg | Freq: Once | INTRAMUSCULAR | Status: AC
  Administered 2012-12-27: 2.5 mg via INTRAVENOUS
  Filled 2012-12-27: qty 2

## 2012-12-27 MED ORDER — SODIUM CHLORIDE 0.9 % IV SOLN
INTRAVENOUS | Status: DC
  Administered 2012-12-27: 14:00:00 via INTRAVENOUS

## 2012-12-27 MED ORDER — PANTOPRAZOLE SODIUM 40 MG PO TBEC
40.0000 mg | DELAYED_RELEASE_TABLET | Freq: Every day | ORAL | Status: DC
  Administered 2012-12-28: 40 mg via ORAL
  Filled 2012-12-27: qty 1

## 2012-12-27 MED ORDER — ONDANSETRON HCL 4 MG/2ML IJ SOLN
4.0000 mg | Freq: Once | INTRAMUSCULAR | Status: AC
  Administered 2012-12-27: 4 mg via INTRAVENOUS
  Filled 2012-12-27: qty 2

## 2012-12-27 MED ORDER — DOCUSATE SODIUM 100 MG PO CAPS
100.0000 mg | ORAL_CAPSULE | Freq: Every day | ORAL | Status: DC
  Administered 2012-12-28 (×2): 100 mg via ORAL
  Filled 2012-12-27 (×2): qty 1

## 2012-12-27 MED ORDER — FENTANYL CITRATE 0.05 MG/ML IJ SOLN
100.0000 ug | Freq: Once | INTRAMUSCULAR | Status: AC
  Administered 2012-12-27: 100 ug via INTRAVENOUS
  Filled 2012-12-27: qty 2

## 2012-12-27 MED ORDER — AMLODIPINE BESYLATE 5 MG PO TABS
5.0000 mg | ORAL_TABLET | Freq: Every day | ORAL | Status: DC
  Administered 2012-12-28 – 2012-12-29 (×2): 5 mg via ORAL
  Filled 2012-12-27 (×2): qty 1

## 2012-12-27 MED ORDER — LISINOPRIL 20 MG PO TABS
20.0000 mg | ORAL_TABLET | Freq: Two times a day (BID) | ORAL | Status: DC
  Administered 2012-12-28 – 2012-12-29 (×4): 20 mg via ORAL
  Filled 2012-12-27 (×5): qty 1

## 2012-12-27 MED ORDER — MORPHINE SULFATE 4 MG/ML IJ SOLN
INTRAMUSCULAR | Status: AC
  Administered 2012-12-27: 4 mg
  Filled 2012-12-27: qty 1

## 2012-12-27 MED ORDER — ENOXAPARIN SODIUM 40 MG/0.4ML ~~LOC~~ SOLN
40.0000 mg | SUBCUTANEOUS | Status: DC
  Administered 2012-12-28: 40 mg via SUBCUTANEOUS
  Filled 2012-12-27 (×2): qty 0.4

## 2012-12-27 MED ORDER — MORPHINE SULFATE 4 MG/ML IJ SOLN
4.0000 mg | Freq: Once | INTRAMUSCULAR | Status: DC
  Filled 2012-12-27: qty 1

## 2012-12-27 MED ORDER — LORATADINE 10 MG PO TABS
10.0000 mg | ORAL_TABLET | Freq: Every day | ORAL | Status: DC
  Administered 2012-12-28 (×2): 10 mg via ORAL
  Filled 2012-12-27 (×3): qty 1

## 2012-12-27 MED ORDER — LEVOTHYROXINE SODIUM 88 MCG PO TABS
88.0000 ug | ORAL_TABLET | Freq: Every day | ORAL | Status: DC
  Administered 2012-12-28 – 2012-12-29 (×2): 88 ug via ORAL
  Filled 2012-12-27 (×4): qty 1

## 2012-12-27 MED ORDER — MORPHINE SULFATE 4 MG/ML IJ SOLN
4.0000 mg | INTRAMUSCULAR | Status: AC | PRN
  Administered 2012-12-27 (×3): 4 mg via INTRAVENOUS
  Filled 2012-12-27 (×2): qty 1

## 2012-12-27 MED ORDER — METHOCARBAMOL 100 MG/ML IJ SOLN
500.0000 mg | Freq: Four times a day (QID) | INTRAVENOUS | Status: DC | PRN
  Filled 2012-12-27: qty 5

## 2012-12-27 MED ORDER — HYDROCODONE-ACETAMINOPHEN 5-325 MG PO TABS
1.0000 | ORAL_TABLET | Freq: Four times a day (QID) | ORAL | Status: DC | PRN
  Administered 2012-12-28 – 2012-12-29 (×3): 1 via ORAL
  Filled 2012-12-27 (×3): qty 1

## 2012-12-27 MED ORDER — POLYETHYLENE GLYCOL 3350 17 GM/SCOOP PO POWD
17.0000 g | Freq: Every day | ORAL | Status: DC
  Filled 2012-12-27: qty 255

## 2012-12-27 MED ORDER — LINACLOTIDE 145 MCG PO CAPS
145.0000 ug | ORAL_CAPSULE | Freq: Two times a day (BID) | ORAL | Status: DC
  Administered 2012-12-28 (×3): 145 ug via ORAL
  Filled 2012-12-27 (×5): qty 1

## 2012-12-27 MED ORDER — ASPIRIN EC 81 MG PO TBEC
81.0000 mg | DELAYED_RELEASE_TABLET | Freq: Every day | ORAL | Status: DC
  Administered 2012-12-28: 81 mg via ORAL
  Filled 2012-12-27 (×2): qty 1

## 2012-12-27 MED ORDER — POLYETHYLENE GLYCOL 3350 17 G PO PACK
17.0000 g | PACK | Freq: Every day | ORAL | Status: DC
  Administered 2012-12-28 (×2): 17 g via ORAL
  Filled 2012-12-27 (×3): qty 1

## 2012-12-27 NOTE — ED Notes (Signed)
Pt resident at [facility] and tripped over floor strip separating rooms.  Pt c/o pain to left hip.  Left leg externally rotated and is shorter than the r. Pt c/o severe pain.  Pt alert and oriented, denies any LOC.

## 2012-12-27 NOTE — ED Notes (Signed)
I called [facility] to give bed assignment

## 2012-12-27 NOTE — ED Notes (Signed)
Patients family given drink and crackers at this time.

## 2012-12-27 NOTE — H&P (Signed)
History and Physical  [name] ZOX:[id number] DOB: [birth year] DOA: 12/27/2012  Referring physician: Raeford Razor, MD PCP: Catalina Pizza, MD   Chief Complaint: Left hip pain  HPI:  79 year old woman presented to the emergency department with hip pain after a mechanical fall. Imaging revealed displaced left hip fracture, otherwise workup was essentially unremarkable. Patient requested transfer to [facility] orthopedics. EDP discussed with Dr. Shon Baton who will see in consultation.   History obtained from patient. She was getting things set up card game, moving a chair for friend when she caught her foot on the end of provider between the carpet and linoleum floor, tripped and fell onto her left hip resulting in immediate pain. Previous to that she has felt generally well, recently had an abdominal ultrasound for some bloating which was unremarkable. Besides left hip pain now she has no other complaints. She has no history of cardiac disease and no history of chest pain or shortness of breath. She does have a pacemaker which was placed for bradycardia.  In the emergency department noted be afebrile with stable vital signs. Sodium 128, chloride 93. Normal BUN and creatinine. CBC unremarkable. Urinalysis negative. Chest x-ray unremarkable. EKG not acute.  Review of Systems:  Negative for fever, new visual changes, sore throat, rash, chest pain, SOB, dysuria, bleeding, n/v/abdominal pain.  Positive for poor vision secondary to bilateral macular degeneration  Past Medical History  Diagnosis Date  . Hypertension   . Hypothyroidism   . Bradycardia   . Anemia   . Diverticulosis   . External hemorrhoids   . Constipation   . Macular degeneration   . Seasonal allergies   . Osteoporosis   . Degenerative joint disease   . Palpitations   . Hyperlipidemia   . GERD (gastroesophageal reflux disease)   . Pacemaker     Past Surgical History  Procedure Laterality Date  . Thyroidectomy     . Tonsillectomy    . Colonoscopy  November 2011    External hemorrhoids, diverticulosis, anal papillae  . Insert / replace / remove pacemaker    . Vertebroplasty      Social History:  reports that she has never smoked. She has never used smokeless tobacco. She reports that she does not drink alcohol or use illicit drugs.  Allergies  Allergen Reactions  . Tramadol Itching and Nausea Only  . Sulfa Antibiotics Nausea And Vomiting    Family History  Problem Relation Age of Onset  . Heart attack Mother     Deceased  . Cancer Father     Deceased  . Cancer Brother     Deceased     Prior to Admission medications   Medication Sig Start Date End Date Taking? Authorizing Provider  acetaminophen (TYLENOL) 325 MG tablet Take 650 mg by mouth every 6 (six) hours as needed. For pain   Yes Historical Provider, MD  amLODipine (NORVASC) 10 MG tablet Take 0.5 tablets (5 mg total) by mouth daily. 12/01/12  Yes Marinus Maw, MD  aspirin EC 81 MG tablet Take 81 mg by mouth daily.   Yes Historical Provider, MD  beta carotene w/minerals (OCUVITE) tablet Take 1 tablet by mouth 2 (two) times daily.   Yes Historical Provider, MD  docusate sodium (COLACE) 100 MG capsule Take 100 mg by mouth at bedtime.   Yes Historical Provider, MD  fexofenadine (ALLEGRA) 180 MG tablet Take 180 mg by mouth daily.   Yes Historical Provider, MD  hydrALAZINE (APRESOLINE) 25 MG tablet  Take 25 mg by mouth 2 (two) times daily.     Yes Historical Provider, MD  levothyroxine (SYNTHROID, LEVOTHROID) 88 MCG tablet Take 88 mcg by mouth every morning.   Yes Historical Provider, MD  Linaclotide (LINZESS) 145 MCG CAPS Take 145 mcg by mouth 2 (two) times daily. As needed   Yes Historical Provider, MD  lisinopril (PRINIVIL,ZESTRIL) 20 MG tablet Take 20 mg by mouth 2 (two) times daily.    Yes Historical Provider, MD  Magnesium 250 MG TABS Take 1 tablet by mouth every evening.   Yes Historical Provider, MD  omeprazole (PRILOSEC) 20 MG  capsule Take 20 mg by mouth every morning.  12/12/12  Yes Historical Provider, MD  polyethylene glycol powder (GLYCOLAX/MIRALAX) powder Take 17 g by mouth daily as needed. For constipation. *One capful to be mixed in water or juice*   Yes Historical Provider, MD  VOLTAREN 1 % GEL  11/26/12  Yes Historical Provider, MD  Wheat Dextrin (BENEFIBER DRINK MIX PO) Take by mouth daily. Mix 2 teaspoonfuls with water water and drink daily.   Yes Historical Provider, MD  ALPRAZolam (XANAX) 0.25 MG tablet Take 0.25 mg by mouth every 8 (eight) hours as needed. For anxiety    Historical Provider, MD  cycloSPORINE (RESTASIS) 0.05 % ophthalmic emulsion Place 1 drop into both eyes 2 (two) times daily.     Historical Provider, MD  fluticasone (FLONASE) 50 MCG/ACT nasal spray Place 2 sprays into the nose daily as needed. For allergies    Historical Provider, MD   Physical Exam: Filed Vitals:   12/27/12 1500 12/27/12 1600 12/27/12 1652 12/27/12 1700  BP: 158/64 164/59 150/54 155/51  Pulse: 73 79 87 90  Temp:      TempSrc:      Resp:   16   SpO2: 99% 100% 95% 97%   General: Examined in the emergency department. Appears calm and comfortable Eyes: Pupils, irises, lids appear unremarkable. ENT: grossly normal hearing, lips & tongue Neck: no LAD, masses or thyromegaly Cardiovascular: RRR, no m/r/g. No LE edema. Respiratory: CTA bilaterally, no w/r/r. Normal respiratory effort. Abdomen: soft, ntnd Skin: no rash, bruising or induration seen  Musculoskeletal: grossly normal tone BUE/BLE, left foot warm and dry. Sensation grossly intact. Left leg is shortened and externally rotated. Psychiatric: grossly normal mood and affect, speech fluent and appropriate Neurologic: grossly non-focal.  Wt Readings from Last 3 Encounters:  06/09/12 66.225 kg (146 lb)  01/15/12 66.225 kg (146 lb)  12/01/11 67.132 kg (148 lb)    Labs on Admission:  Basic Metabolic Panel:  Recent Labs Lab 12/27/12 1349  NA 128*  K 4.2  CL  93*  CO2 24  GLUCOSE 173*  BUN 16  CREATININE 1.09  CALCIUM 9.2   CBC:  Recent Labs Lab 12/27/12 1349  WBC 6.4  NEUTROABS 3.0  HGB 11.5*  HCT 33.2*  MCV 96.5  PLT 324   Radiological Exams on Admission: Dg Chest 1 View  12/27/2012   *RADIOLOGY REPORT*  Clinical Data: Possible hip fracture.  CHEST - 1 VIEW  Comparison: 08/02/2011  Findings: Left-sided pacemaker is unchanged.  Lungs are adequately inflated without consolidation or effusion.  There is mild stable cardiomegaly.  Remainder of the exam is unchanged.  IMPRESSION: No acute cardiopulmonary disease.  Mild stable cardiomegaly.   Original Report Authenticated By: Elberta Fortis, M.D.   Dg Hip Complete Left  12/27/2012   *RADIOLOGY REPORT*  Clinical Data: Possible left hip fracture post fall.  LEFT HIP -  COMPLETE 2+ VIEW  Comparison: 12/11/2006  Findings: There is diffuse decreased bone density.  There is a displaced intertrochanteric fracture of the left hip with resulting coxa varum deformity.  Remainder of the exam is unchanged.  IMPRESSION: Displaced intertrochanteric fracture of the left hip.   Original Report Authenticated By: Elberta Fortis, M.D.    EKG: Independently reviewed. Normal sinus rhythm, no acute changes.   Principal Problem:   Closed left hip fracture Active Problems:   ANEMIA   Hypertension   Hypothyroidism   Pacemaker   Hyponatremia   Assessment/Plan 1. Displaced intertrochanteric fracture of the left hip: Pain control. Management per orthopedics, family has requested transfer to [facility], Dr. Shon Baton will see in consultation. No history of coronary disease or recent symptoms of chest pain or shortness of breath. Perioperative risks primarily advanced age, hypertension. No further preoperative evaluation is suggested. Consider perioperative beta blocker. 2. Hyponatremia: Suspect dehydration. IV fluids. Recheck in the morning. Appears asymptomatic. Possible secondary to dehydration. 3. Hypertension:  Appears stable. Continue chronic medications. 4. Hypothyroidism: Continue Synthroid. 5. Status post pacemaker placement 6. Chronic constipation: Will need attention in bowel regimen especially on narcotics. Continue Linzess.  Discussed with flow manager. Accepting physician at [facility] Dr. Sunnie Nielsen, [facility] Team 6.  Code Status: DNR  DVT prophylaxis:Lovenox Family Communication: discussed with son at bedside Disposition Plan/Anticipated LOS: admit to [facility], 3 days  Time spent: 50 minutes  Brendia Sacks, MD  [facility] Pager [phone] 12/27/2012, 5:26 PM

## 2012-12-27 NOTE — ED Provider Notes (Signed)
CSN: [id number]     Arrival date & time 12/27/12  1337 History  This chart was scribed for Raeford Razor, MD by Quintella Reichert, ED scribe.  This patient was seen in room APA01/APA01 and the patient's care was started at 1:42 PM.  Chief Complaint  Patient presents with  . Hip Pain    The history is provided by the patient. No language interpreter was used.    HPI Comments: AMMI HUTT is a 79 y.o. female with h/o osteoporosis and degenerative joint disease who presents to the Emergency Department complaining of a fall that occurred pta with subsequent severe left hip pain.  Pt was walking into a room and tripped over transition between carpeted/hardwood flor.  Fell with impact to her left hip.  She denies head impact or LOC.  Since the fall she has had constant, severe, waxing-and-waning pain to the hip.  Pain is described as "drawing" and spasming and is greatly exacerbated by moving the hip.  She denies neck pain, back pain or nausea.  Pt is not on blood-thinners.   Past Medical History  Diagnosis Date  . Hypertension   . Hypothyroidism   . Bradycardia   . Anemia   . Diverticulosis   . External hemorrhoids   . Constipation   . Macular degeneration   . Seasonal allergies   . Osteoporosis   . Degenerative joint disease   . Palpitations   . Hyperlipidemia   . GERD (gastroesophageal reflux disease)   . Pacemaker     Past Surgical History  Procedure Laterality Date  . Thyroidectomy    . Tonsillectomy    . Colonoscopy  November 2011    External hemorrhoids, diverticulosis, anal papillae  . Insert / replace / remove pacemaker      Family History  Problem Relation Age of Onset  . Heart attack Mother     Deceased  . Cancer Father     Deceased  . Cancer Brother     Deceased    History  Substance Use Topics  . Smoking status: Never Smoker   . Smokeless tobacco: Never Used  . Alcohol Use: No    OB History   Grav Para Term Preterm Abortions TAB SAB Ect Mult Living                  Review of Systems  All other systems reviewed and are negative.    Allergies  Tramadol and Sulfa antibiotics  Home Medications   Current Outpatient Rx  Name  Route  Sig  Dispense  Refill  . acetaminophen (TYLENOL) 325 MG tablet   Oral   Take 650 mg by mouth every 4 (four) hours as needed. For pain         . ALPRAZolam (XANAX) 0.25 MG tablet   Oral   Take 0.25 mg by mouth every 8 (eight) hours as needed. For anxiety         . amLODipine (NORVASC) 10 MG tablet   Oral   Take 0.5 tablets (5 mg total) by mouth daily.   15 tablet   2   . beta carotene w/minerals (OCUVITE) tablet   Oral   Take 1 tablet by mouth 2 (two) times daily.         . cycloSPORINE (RESTASIS) 0.05 % ophthalmic emulsion   Both Eyes   Place 1 drop into both eyes 2 (two) times daily.          Marland Kitchen docusate sodium (COLACE) 100  MG capsule   Oral   Take 100 mg by mouth at bedtime.         . fexofenadine (ALLEGRA) 180 MG tablet   Oral   Take 180 mg by mouth daily.         . fluticasone (FLONASE) 50 MCG/ACT nasal spray   Nasal   Place 2 sprays into the nose daily as needed. For allergies         . hydrALAZINE (APRESOLINE) 25 MG tablet   Oral   Take 25 mg by mouth 2 (two) times daily.           Marland Kitchen levothyroxine (SYNTHROID, LEVOTHROID) 100 MCG tablet   Oral   Take 100 mcg by mouth daily.         . Linaclotide (LINZESS) 145 MCG CAPS   Oral   Take 145 mcg by mouth 2 (two) times daily. As needed         . lisinopril (PRINIVIL,ZESTRIL) 20 MG tablet   Oral   Take 20 mg by mouth 2 (two) times daily.           Marland Kitchen ofloxacin (OCUFLOX) 0.3 % ophthalmic solution   Left Eye   Place 1 drop into the left eye 3 (three) times daily.         . polyethylene glycol powder (GLYCOLAX/MIRALAX) powder   Oral   Take 17 g by mouth daily as needed. For constipation. *One capful to be mixed in water or juice*         . promethazine (PHENERGAN) 25 MG tablet   Oral   Take 25 mg  by mouth every 6 (six) hours as needed. For nausea         . psyllium (HYDROCIL/METAMUCIL) 95 % PACK   Oral   Take 1 packet by mouth daily.         Marland Kitchen tobramycin-dexamethasone (TOBRADEX) ophthalmic ointment   Left Eye   Place 1 application into the left eye 2 (two) times daily. 1/4 inch strip bid as needed to left eyelid          BP 159/67  Pulse 83  Temp(Src) 98.3 F (36.8 C) (Oral)  Resp 16  SpO2 100%  Physical Exam  Nursing note and vitals reviewed. Constitutional: She is oriented to person, place, and time. She appears well-developed and well-nourished.  Appears uncomfortable  HENT:  Head: Normocephalic and atraumatic.  Eyes: EOM are normal. Pupils are equal, round, and reactive to light. Right eye exhibits no discharge. Left eye exhibits no discharge.  Neck: Neck supple.  Cardiovascular: Normal rate and regular rhythm.   No murmur heard. Good DP and PT pulse  Pulmonary/Chest: Effort normal and breath sounds normal. No stridor. No respiratory distress. She has no wheezes. She has no rales. She exhibits no tenderness.  Abdominal: Soft. Bowel sounds are normal. She exhibits no distension and no mass. There is no tenderness. There is no rebound.  Musculoskeletal: She exhibits tenderness.  Left lower extremity slightly shortened and externally rotated Severe pain with attempted ROM of left hip  Lymphadenopathy:    She has no cervical adenopathy.  Neurological: She is alert and oriented to person, place, and time.  Able to move toes  Skin: Skin is warm and dry. No rash noted.  Foot is warm  Psychiatric: She has a normal mood and affect.    ED Course  Procedures (including critical care time)  DIAGNOSTIC STUDIES: Oxygen Saturation is 100% on room air, normal by my  interpretation.    COORDINATION OF CARE: 1:52 PM-Discussed treatment plan which includes pain medication and imaging with pt at bedside and pt agreed to plan.    Labs Review Labs Reviewed  BASIC  METABOLIC PANEL - Abnormal; Notable for the following:    Sodium 128 (*)    Chloride 93 (*)    Glucose, Bld 173 (*)    GFR calc non Af Amer 44 (*)    GFR calc Af Amer 51 (*)    All other components within normal limits  CBC WITH DIFFERENTIAL - Abnormal; Notable for the following:    RBC 3.44 (*)    Hemoglobin 11.5 (*)    HCT 33.2 (*)    RDW 16.0 (*)    All other components within normal limits  URINALYSIS, ROUTINE W REFLEX MICROSCOPIC    Imaging Review Dg Chest 1 View  12/27/2012   *RADIOLOGY REPORT*  Clinical Data: Possible hip fracture.  CHEST - 1 VIEW  Comparison: 08/02/2011  Findings: Left-sided pacemaker is unchanged.  Lungs are adequately inflated without consolidation or effusion.  There is mild stable cardiomegaly.  Remainder of the exam is unchanged.  IMPRESSION: No acute cardiopulmonary disease.  Mild stable cardiomegaly.   Original Report Authenticated By: Elberta Fortis, M.D.   Dg Hip Complete Left  12/27/2012   *RADIOLOGY REPORT*  Clinical Data: Possible left hip fracture post fall.  LEFT HIP - COMPLETE 2+ VIEW  Comparison: 12/11/2006  Findings: There is diffuse decreased bone density.  There is a displaced intertrochanteric fracture of the left hip with resulting coxa varum deformity.  Remainder of the exam is unchanged.  IMPRESSION: Displaced intertrochanteric fracture of the left hip.   Original Report Authenticated By: Elberta Fortis, M.D.    MDM   1. Intertrochanteric fracture, left, closed, initial encounter   2. Hyponatremia     79 year old female with a closed left hip fracture after mechanical fall. Patient/family is requesting [facility] orthopedics. Discussed with Dr. Shon Baton, orthopedic surgery. He is agreeable to this. Discussed with family that surgery may possibly be delayed until Monday. Will discuss with hospitalist service. Transfer to [facility].    I personally preformed the services scribed in my presence. The recorded information has been reviewed is accurate.  Raeford Razor, MD.    Raeford Razor, MD 12/27/12 [phone]

## 2012-12-27 NOTE — ED Notes (Signed)
Pedal pulses present.

## 2012-12-28 LAB — CBC
HCT: 24.6 % — ABNORMAL LOW (ref 36.0–46.0)
MCV: 95.3 fL (ref 78.0–100.0)
Platelets: 267 10*3/uL (ref 150–400)
RBC: 2.58 MIL/uL — ABNORMAL LOW (ref 3.87–5.11)
WBC: 7.2 10*3/uL (ref 4.0–10.5)

## 2012-12-28 LAB — BASIC METABOLIC PANEL
BUN: 18 mg/dL (ref 6–23)
Calcium: 8.1 mg/dL — ABNORMAL LOW (ref 8.4–10.5)
GFR calc non Af Amer: 44 mL/min — ABNORMAL LOW (ref >90)
Glucose, Bld: 130 mg/dL — ABNORMAL HIGH (ref 70–99)

## 2012-12-28 MED ORDER — CEFAZOLIN SODIUM-DEXTROSE 2-3 GM-% IV SOLR
2.0000 g | Freq: Once | INTRAVENOUS | Status: AC
  Administered 2012-12-29: 2 g via INTRAVENOUS
  Filled 2012-12-28: qty 50

## 2012-12-28 MED ORDER — ONDANSETRON HCL 4 MG PO TABS: 4.0000 mg | ORAL_TABLET | Freq: Four times a day (QID) | ORAL | Status: DC | PRN

## 2012-12-28 MED ORDER — ONDANSETRON HCL 4 MG/2ML IJ SOLN
4.0000 mg | Freq: Four times a day (QID) | INTRAMUSCULAR | Status: DC | PRN
  Administered 2012-12-28: 4 mg via INTRAVENOUS
  Filled 2012-12-28: qty 2

## 2012-12-28 MED ORDER — MORPHINE SULFATE 2 MG/ML IJ SOLN
1.0000 mg | INTRAMUSCULAR | Status: DC | PRN
  Administered 2012-12-28 – 2012-12-29 (×3): 1 mg via INTRAVENOUS
  Filled 2012-12-28 (×3): qty 1

## 2012-12-28 NOTE — Consult Note (Signed)
Marilyn Pizza, [name] Chief Complaint: Left hip fracture History: [name] is a 79 y.o. female with h/o osteoporosis and degenerative joint disease who presents to the Emergency Department complaining of a fall that occurred pta with subsequent severe left hip pain. Pt was walking into a room and tripped over transition between carpeted/hardwood flor. Fell with impact to her left hip. She denies head impact or LOC. Since the fall she has had constant, severe, waxing-and-waning pain to the hip. Pain is described as "drawing" and spasming and is greatly exacerbated by moving the hip. She denies neck pain, back pain or nausea. Pt is not on blood-thinners.  Past Medical History  Diagnosis Date  . Hypertension   . Hypothyroidism   . Bradycardia   . Anemia   . Diverticulosis   . External hemorrhoids   . Constipation   . Macular degeneration   . Seasonal allergies   . Osteoporosis   . Degenerative joint disease   . Palpitations   . Hyperlipidemia   . GERD (gastroesophageal reflux disease)   . Pacemaker     Allergies  Allergen Reactions  . Tramadol Itching and Nausea Only  . Sulfa Antibiotics Nausea And Vomiting    No current facility-administered medications on file prior to encounter.   Current Outpatient Prescriptions on File Prior to Encounter  Medication Sig Dispense Refill  . acetaminophen (TYLENOL) 325 MG tablet Take 650 mg by mouth every 6 (six) hours as needed. For pain      . amLODipine (NORVASC) 10 MG tablet Take 0.5 tablets (5 mg total) by mouth daily.  15 tablet  2  . beta carotene w/minerals (OCUVITE) tablet Take 1 tablet by mouth 2 (two) times daily.      Marland Kitchen docusate sodium (COLACE) 100 MG capsule Take 100 mg by mouth at bedtime.      . fexofenadine (ALLEGRA) 180 MG tablet Take 180 mg by mouth daily.      . hydrALAZINE (APRESOLINE) 25 MG tablet Take 25 mg by mouth 2 (two) times daily.        . Linaclotide (LINZESS) 145 MCG CAPS Take 145 mcg by mouth 2 (two) times daily. As  needed      . lisinopril (PRINIVIL,ZESTRIL) 20 MG tablet Take 20 mg by mouth 2 (two) times daily.       . polyethylene glycol powder (GLYCOLAX/MIRALAX) powder Take 17 g by mouth daily as needed. For constipation. *One capful to be mixed in water or juice*      . ALPRAZolam (XANAX) 0.25 MG tablet Take 0.25 mg by mouth every 8 (eight) hours as needed. For anxiety      . cycloSPORINE (RESTASIS) 0.05 % ophthalmic emulsion Place 1 drop into both eyes 2 (two) times daily.       . fluticasone (FLONASE) 50 MCG/ACT nasal spray Place 2 sprays into the nose daily as needed. For allergies        Physical Exam: Filed Vitals:   12/28/12 0649  BP: 129/42  Pulse: 75  Temp: 98.5 F (36.9 C)  Resp: 18  A+O X3 NVI - compartments soft/NT  EHL/TA/GA intact  Sensation intact to LT No SOB/CP Abd soft/NT Intact pulses   Image: Dg Chest 1 View  12/27/2012   *RADIOLOGY REPORT*  Clinical Data: Possible hip fracture.  CHEST - 1 VIEW  Comparison: 08/02/2011  Findings: Left-sided pacemaker is unchanged.  Lungs are adequately inflated without consolidation or effusion.  There is mild stable cardiomegaly.  Remainder of the exam  is unchanged.  IMPRESSION: No acute cardiopulmonary disease.  Mild stable cardiomegaly.   Original Report Authenticated By: Elberta Fortis, M.D.   Dg Hip Complete Left  12/27/2012   *RADIOLOGY REPORT*  Clinical Data: Possible left hip fracture post fall.  LEFT HIP - COMPLETE 2+ VIEW  Comparison: 12/11/2006  Findings: There is diffuse decreased bone density.  There is a displaced intertrochanteric fracture of the left hip with resulting coxa varum deformity.  Remainder of the exam is unchanged.  IMPRESSION: Displaced intertrochanteric fracture of the left hip.   Original Report Authenticated By: Elberta Fortis, M.D.   US Abdomen Complete  12/23/2012   *RADIOLOGY REPORT*  Clinical Data:  Abdominal pain and bloating  COMPLETE ABDOMINAL ULTRASOUND  Comparison:  CT abdomen/pelvis 12/01/2011  Findings:   Gallbladder:  No gallstones, gallbladder wall thickening, or pericholecystic fluid. Per the sonographer, the sonographic Murphy's sign was negative.  Common bile duct:  Within normal limits at 3.8 mm in caliber.  Liver:  No focal lesion identified.  Within normal limits in parenchymal echogenicity.  IVC:  Appears normal.  Pancreas:  No focal abnormality seen.  The tail is partially obscured by overlying bowel gas.  Spleen:  Sonographically unremarkable.  The spleen is normal in size at 6.2 cm in length.  Right Kidney:  Normal reniform contour without hydronephrosis or nephrolithiasis.  9.2 cm in length.  No solid renal mass.  Left Kidney:  Normal reniform contour without hydronephrosis or nephrolithiasis.  9.4 cm in length.  No solid renal mass.  Abdominal aorta:  No aneurysmal dilatation.  Atherosclerotic vascular calcifications noted.  IMPRESSION:  Negative abdominal ultrasound.   Original Report Authenticated By: Malachy Moan, M.D.    A/P: Elderly woman s/p mechanical fall yesterday.  Transferred at her request from [facility] to [facility] for definitive fracture management.  Discussed risks/benefits of surgery - death, stroke, paralysis, post-traumatic arthritis, failure to heal, infection, need for further surgery, worsening pain, and hardware failure. Plan on ORIF with nail Monday PM. Patient cleared by medical team Teds/SCD for DVT pre-op

## 2012-12-28 NOTE — Progress Notes (Signed)
Pt has been put on tele monitoring, 5N04 box

## 2012-12-28 NOTE — Progress Notes (Signed)
[name] ZOX:[id number] DOB: [birth year] DOA: 12/27/2012 PCP: Catalina Pizza, MD  Brief narrative: 79 yo ? admitted from [facility] with L Inter-troch hip # 12/27/12-known h/o diastolic HF, Ef 70%06/04/11, symptomatic bradycardia s/p PPM [employer] 06/07/11, hypothyroid, htn, dysphagia 2/2 to hiatal hernia hyponatremia, normocytic anemia-orthopedics dr. Shon Baton consulted to eval and Rx    Past medical history-As per Problem list Chart reviewed as below- See notes  Consultants:  Ortho  Procedures:  cxr  Hip xrays  Antibiotics:  none   Subjective  Alert pleasant. State [place] is significant on moving LLE NO n/v/cp/sob States that she doesn;t have basline dizzyness.  Can walk about 1 mile [3x around [place] place hallways] Can walk up at least 20 stairs ? can perform about 4 mets exercise   Objective    Interim History:   Telemetry: Non tele-requested tele 2/2 to brady  Objective: Filed Vitals:   12/27/12 2017 12/27/12 2137 12/28/12 0036 12/28/12 0649  BP: 142/58 148/51 128/48 129/42  Pulse: 89 77  75  Temp: 98.7 F (37.1 C) 98.2 F (36.8 C)  98.5 F (36.9 C)  TempSrc:  Oral    Resp: 20 18  18   Height:  5\' 5"  (1.651 m)    Weight:  65.499 kg (144 lb 6.4 oz)    SpO2: 99% 99%  100%    Intake/Output Summary (Last 24 hours) at 12/28/12 0750 Last data filed at 12/28/12 0650  Gross per 24 hour  Intake    360 ml  Output   1050 ml  Net   -690 ml    Exam:  General: EOMI, NCAT Cardiovascular:  s1 s2  NSR  Respiratory: clear, no added sound Abdomen:  Soft, NT/ND Skin intact, LLE externally rotated Neurointact forssly.  Good memory  Data Reviewed: Basic Metabolic Panel:  Recent Labs Lab 12/27/12 1349 12/27/12 2200 12/28/12 0520  NA 128*  --  131*  K 4.2  --  4.0  CL 93*  --  98  CO2 24  --  24  GLUCOSE 173*  --  130*  BUN 16  --  18  CREATININE 1.09 1.19* 1.10  CALCIUM 9.2  --  8.1*   Liver Function Tests: No results found for this basename: AST, ALT,  ALKPHOS, BILITOT, PROT, ALBUMIN,  in the last 168 hours No results found for this basename: LIPASE, AMYLASE,  in the last 168 hours No results found for this basename: AMMONIA,  in the last 168 hours CBC:  Recent Labs Lab 12/27/12 1349 12/27/12 2200 12/28/12 0520  WBC 6.4 12.9* 7.2  NEUTROABS 3.0  --   --   HGB 11.5* 7.4* 8.6*  HCT 33.2* 22.2* 24.6*  MCV 96.5 89.5 95.3  PLT 324 178 267   Cardiac Enzymes: No results found for this basename: CKTOTAL, CKMB, CKMBINDEX, TROPONINI,  in the last 168 hours BNP: No components found with this basename: POCBNP,  CBG: No results found for this basename: GLUCAP,  in the last 168 hours  No results found for this or any previous visit (from the past 240 hour(s)).   Studies:              All Imaging reviewed and is as per above notation   Scheduled Meds: . amLODipine  5 mg Oral Daily  . aspirin EC  81 mg Oral Daily  . cycloSPORINE  1 drop Both Eyes BID  . docusate sodium  100 mg Oral QHS  . enoxaparin (LOVENOX) injection  40 mg  Subcutaneous Q24H  . hydrALAZINE  25 mg Oral BID  . levothyroxine  88 mcg Oral QAC breakfast  . Linaclotide  145 mcg Oral BID  . lisinopril  20 mg Oral BID  . loratadine  10 mg Oral Daily  . pantoprazole  40 mg Oral Daily  . polyethylene glycol  17 g Oral Daily   Continuous Infusions:     Assessment/Plan  1. Displaced intertrochanteric fracture of the left hip: Pain control, Weight bearing, Anticoag post-op per orthopedics, family has requested transfer to [facility], Dr. Darrelyn Hillock aware of patient.  Intermediate risk given nature of surggery, but medically cleared for surgery given exercise tolerance 4-5 mets 2. Hyponatremia: Suspect dehydration. IV fluids. Sodium improved. Possible secondary to Vol depletion 3. Htn: Appears stable. Continue chronic Lisinopril 20 q am, Amlodipine 5 mg 4. Hypothyroidism: Continue Synthroid 88 MCg qam-rpt TSH 6-8 wk 5. Status post pacemaker placement-stable.  Started on  tele 6. Chronic constipation: Will need attention in bowel regimen especially on narcotics. Continue Linzess.   Code Status: Full Family Communication:  Discussed with patient/son at bedisde Disposition Plan:  Inpt-UP with therapy per Ortho, ?CIR   Pleas Koch, MD  [facility] Pager [phone] 12/28/2012, 7:50 AM    LOS: 1 day

## 2012-12-28 NOTE — Progress Notes (Signed)
Orthopedic Tech Progress Note Patient Details:  [name] 10-12-1922 [id number]  Patient ID: [name], female   DOB: 06-Sep-1922, 79 y.o.   MRN: [id number]   [name] 12/28/2012, 2:13 PMTrapeze bar.

## 2012-12-29 ENCOUNTER — Encounter (HOSPITAL_COMMUNITY): Payer: Self-pay | Admitting: Certified Registered"

## 2012-12-29 ENCOUNTER — Inpatient Hospital Stay (HOSPITAL_COMMUNITY): Payer: Medicare Other | Admitting: Certified Registered"

## 2012-12-29 ENCOUNTER — Inpatient Hospital Stay (HOSPITAL_COMMUNITY): Payer: Medicare Other

## 2012-12-29 ENCOUNTER — Encounter (HOSPITAL_COMMUNITY): Payer: Self-pay | Admitting: General Practice

## 2012-12-29 ENCOUNTER — Encounter (HOSPITAL_COMMUNITY)
Admission: EM | Disposition: A | Discharge status: Skilled Nursing Facility | Payer: Self-pay | Source: Home / Self Care | Attending: Family Medicine

## 2012-12-29 HISTORY — PX: FEMUR IM NAIL: SHX1597

## 2012-12-29 SURGERY — INSERTION, INTRAMEDULLARY ROD, FEMUR
Anesthesia: General | Site: Hip | Laterality: Left | Wound class: Clean

## 2012-12-29 MED ORDER — FENTANYL CITRATE 0.05 MG/ML IJ SOLN
25.0000 ug | INTRAMUSCULAR | Status: DC | PRN
  Administered 2012-12-29: 25 ug via INTRAVENOUS
  Administered 2012-12-29: 50 ug via INTRAVENOUS

## 2012-12-29 MED ORDER — NEOSTIGMINE METHYLSULFATE 1 MG/ML IJ SOLN
INTRAMUSCULAR | Status: DC | PRN
  Administered 2012-12-29: 3 mg via INTRAVENOUS

## 2012-12-29 MED ORDER — ENOXAPARIN SODIUM 40 MG/0.4ML ~~LOC~~ SOLN
40.0000 mg | SUBCUTANEOUS | Status: DC
  Administered 2012-12-30 – 2013-01-01 (×3): 40 mg via SUBCUTANEOUS
  Filled 2012-12-29 (×4): qty 0.4

## 2012-12-29 MED ORDER — HYDROCODONE-ACETAMINOPHEN 5-325 MG PO TABS
1.0000 | ORAL_TABLET | Freq: Four times a day (QID) | ORAL | Status: DC | PRN
  Administered 2012-12-30: 2 via ORAL
  Administered 2012-12-30 (×2): 1 via ORAL
  Filled 2012-12-29: qty 2
  Filled 2012-12-29 (×2): qty 1

## 2012-12-29 MED ORDER — ASPIRIN EC 325 MG PO TBEC
325.0000 mg | DELAYED_RELEASE_TABLET | Freq: Every day | ORAL | Status: DC
  Administered 2012-12-30 – 2013-01-01 (×3): 325 mg via ORAL
  Filled 2012-12-29 (×4): qty 1

## 2012-12-29 MED ORDER — 0.9 % SODIUM CHLORIDE (POUR BTL) OPTIME
TOPICAL | Status: DC | PRN
  Administered 2012-12-29: 1000 mL

## 2012-12-29 MED ORDER — ACETAMINOPHEN 325 MG PO TABS: 650.0000 mg | ORAL_TABLET | Freq: Four times a day (QID) | ORAL | Status: DC | PRN

## 2012-12-29 MED ORDER — METOCLOPRAMIDE HCL 5 MG/ML IJ SOLN: 5.0000 mg | Freq: Three times a day (TID) | INTRAMUSCULAR | Status: DC | PRN

## 2012-12-29 MED ORDER — PROPOFOL 10 MG/ML IV BOLUS
INTRAVENOUS | Status: DC | PRN
  Administered 2012-12-29: 130 mg via INTRAVENOUS

## 2012-12-29 MED ORDER — PHENOL 1.4 % MT LIQD: 1.0000 | OROMUCOSAL | Status: DC | PRN

## 2012-12-29 MED ORDER — MORPHINE SULFATE 2 MG/ML IJ SOLN: 0.5000 mg | INTRAMUSCULAR | Status: DC | PRN

## 2012-12-29 MED ORDER — BUPIVACAINE HCL (PF) 0.25 % IJ SOLN
INTRAMUSCULAR | Status: DC | PRN
  Administered 2012-12-29: 20 mL

## 2012-12-29 MED ORDER — FERROUS SULFATE 325 (65 FE) MG PO TABS
325.0000 mg | ORAL_TABLET | Freq: Three times a day (TID) | ORAL | Status: DC
  Administered 2012-12-30 – 2013-01-01 (×8): 325 mg via ORAL
  Filled 2012-12-29 (×11): qty 1

## 2012-12-29 MED ORDER — ONDANSETRON HCL 4 MG PO TABS: 4.0000 mg | ORAL_TABLET | Freq: Four times a day (QID) | ORAL | Status: DC | PRN

## 2012-12-29 MED ORDER — CEFAZOLIN SODIUM-DEXTROSE 2-3 GM-% IV SOLR
2.0000 g | Freq: Four times a day (QID) | INTRAVENOUS | Status: AC
  Administered 2012-12-29 – 2012-12-30 (×2): 2 g via INTRAVENOUS
  Filled 2012-12-29 (×2): qty 50

## 2012-12-29 MED ORDER — ROCURONIUM BROMIDE 100 MG/10ML IV SOLN
INTRAVENOUS | Status: DC | PRN
  Administered 2012-12-29: 35 mg via INTRAVENOUS

## 2012-12-29 MED ORDER — MUPIROCIN 2 % EX OINT
TOPICAL_OINTMENT | Freq: Two times a day (BID) | CUTANEOUS | Status: DC
  Administered 2012-12-29 (×2): via NASAL
  Filled 2012-12-29: qty 22

## 2012-12-29 MED ORDER — FENTANYL CITRATE 0.05 MG/ML IJ SOLN
INTRAMUSCULAR | Status: AC
  Administered 2012-12-29: 25 ug via INTRAVENOUS
  Filled 2012-12-29: qty 2

## 2012-12-29 MED ORDER — ACETAMINOPHEN 650 MG RE SUPP: 650.0000 mg | Freq: Four times a day (QID) | RECTAL | Status: DC | PRN

## 2012-12-29 MED ORDER — GLYCOPYRROLATE 0.2 MG/ML IJ SOLN
INTRAMUSCULAR | Status: DC | PRN
  Administered 2012-12-29: .5 mg via INTRAVENOUS

## 2012-12-29 MED ORDER — METHOCARBAMOL 100 MG/ML IJ SOLN
500.0000 mg | Freq: Four times a day (QID) | INTRAMUSCULAR | Status: DC | PRN
  Filled 2012-12-29: qty 5

## 2012-12-29 MED ORDER — ONDANSETRON HCL 4 MG/2ML IJ SOLN: 4.0000 mg | Freq: Once | INTRAMUSCULAR | Status: DC | PRN

## 2012-12-29 MED ORDER — LACTATED RINGERS IV SOLN
INTRAVENOUS | Status: DC
  Administered 2012-12-29: 16:00:00 via INTRAVENOUS

## 2012-12-29 MED ORDER — FENTANYL CITRATE 0.05 MG/ML IJ SOLN
INTRAMUSCULAR | Status: DC | PRN
  Administered 2012-12-29: 100 ug via INTRAVENOUS
  Administered 2012-12-29 (×2): 50 ug via INTRAVENOUS

## 2012-12-29 MED ORDER — METOCLOPRAMIDE HCL 10 MG PO TABS: 5.0000 mg | ORAL_TABLET | Freq: Three times a day (TID) | ORAL | Status: DC | PRN

## 2012-12-29 MED ORDER — ACETAMINOPHEN 10 MG/ML IV SOLN
INTRAVENOUS | Status: DC | PRN
  Administered 2012-12-29: 1000 mg via INTRAVENOUS

## 2012-12-29 MED ORDER — ONDANSETRON HCL 4 MG/2ML IJ SOLN
INTRAMUSCULAR | Status: DC | PRN
  Administered 2012-12-29: 4 mg via INTRAVENOUS

## 2012-12-29 MED ORDER — MENTHOL 3 MG MT LOZG: 1.0000 | LOZENGE | OROMUCOSAL | Status: DC | PRN

## 2012-12-29 MED ORDER — LACTATED RINGERS IV SOLN
INTRAVENOUS | Status: DC
  Administered 2012-12-29: 21:00:00 via INTRAVENOUS

## 2012-12-29 MED ORDER — PHENYLEPHRINE HCL 10 MG/ML IJ SOLN
INTRAMUSCULAR | Status: DC | PRN
  Administered 2012-12-29: 120 ug via INTRAVENOUS
  Administered 2012-12-29 (×2): 80 ug via INTRAVENOUS

## 2012-12-29 MED ORDER — BUPIVACAINE HCL (PF) 0.25 % IJ SOLN
INTRAMUSCULAR | Status: AC
  Filled 2012-12-29: qty 30

## 2012-12-29 MED ORDER — DOCUSATE SODIUM 100 MG PO CAPS
100.0000 mg | ORAL_CAPSULE | Freq: Two times a day (BID) | ORAL | Status: DC
  Administered 2012-12-29 – 2013-01-01 (×6): 100 mg via ORAL
  Filled 2012-12-29 (×7): qty 1

## 2012-12-29 MED ORDER — METHOCARBAMOL 500 MG PO TABS
500.0000 mg | ORAL_TABLET | Freq: Four times a day (QID) | ORAL | Status: DC | PRN
  Administered 2012-12-29 – 2013-01-01 (×8): 500 mg via ORAL
  Filled 2012-12-29 (×8): qty 1

## 2012-12-29 MED ORDER — ONDANSETRON HCL 4 MG/2ML IJ SOLN: 4.0000 mg | Freq: Four times a day (QID) | INTRAMUSCULAR | Status: DC | PRN

## 2012-12-29 MED ORDER — LIDOCAINE HCL (CARDIAC) 20 MG/ML IV SOLN
INTRAVENOUS | Status: DC | PRN
  Administered 2012-12-29: 80 mg via INTRAVENOUS

## 2012-12-29 SURGICAL SUPPLY — 41 items
BIT DRILL SPINE 4.0X260 (BIT) ×2 IMPLANT
BLADE HELICAL 105 (Orthopedic Implant) ×2 IMPLANT
CLOTH BEACON ORANGE TIMEOUT ST (SAFETY) ×2 IMPLANT
COVER SURGICAL LIGHT HANDLE (MISCELLANEOUS) ×2 IMPLANT
DRAPE STERI IOBAN 125X83 (DRAPES) ×2 IMPLANT
DRAPE U-SHAPE 47X51 STRL (DRAPES) IMPLANT
DRSG MEPILEX BORDER 4X4 (GAUZE/BANDAGES/DRESSINGS) ×4 IMPLANT
DRSG MEPILEX BORDER 4X8 (GAUZE/BANDAGES/DRESSINGS) ×2 IMPLANT
ELECT REM PT RETURN 9FT ADLT (ELECTROSURGICAL) ×2
ELECTRODE REM PT RTRN 9FT ADLT (ELECTROSURGICAL) ×1 IMPLANT
GLOVE BIOGEL PI IND STRL 8 (GLOVE) ×1 IMPLANT
GLOVE BIOGEL PI IND STRL 8.5 (GLOVE) ×1 IMPLANT
GLOVE BIOGEL PI INDICATOR 8 (GLOVE) ×1
GLOVE BIOGEL PI INDICATOR 8.5 (GLOVE) ×1
GLOVE BIOGEL PI ORTHO PRO SZ7 (GLOVE) ×1
GLOVE ECLIPSE 8.5 STRL (GLOVE) ×2 IMPLANT
GLOVE ORTHO TXT STRL SZ7.5 (GLOVE) ×2 IMPLANT
GLOVE PI ORTHO PRO STRL SZ7 (GLOVE) ×1 IMPLANT
GLOVE SS N UNI LF 7.0 STRL (GLOVE) ×2 IMPLANT
GOWN BRE IMP PREV XXLGXLNG (GOWN DISPOSABLE) ×6 IMPLANT
GOWN PREVENTION PLUS XXLARGE (GOWN DISPOSABLE) IMPLANT
GOWN STRL NON-REIN LRG LVL3 (GOWN DISPOSABLE) IMPLANT
GOWN STRL REIN 2XL XLG LVL4 (GOWN DISPOSABLE) IMPLANT
GUIDEWIRE 3.2X400 (WIRE) ×4 IMPLANT
KIT BASIN OR (CUSTOM PROCEDURE TRAY) ×2 IMPLANT
KIT ROOM TURNOVER OR (KITS) ×2 IMPLANT
MANIFOLD NEPTUNE II (INSTRUMENTS) ×2 IMPLANT
NAIL TORCH 11X235MM (Nail) ×2 IMPLANT
NEEDLE 22X1 1/2 (OR ONLY) (NEEDLE) ×2 IMPLANT
NS IRRIG 1000ML POUR BTL (IV SOLUTION) ×2 IMPLANT
PACK GENERAL/GYN (CUSTOM PROCEDURE TRAY) ×2 IMPLANT
PAD ARMBOARD 7.5X6 YLW CONV (MISCELLANEOUS) ×4 IMPLANT
SCREW LOCKING IM 5.0MX40M (Screw) ×2 IMPLANT
STAPLER VISISTAT 35W (STAPLE) ×2 IMPLANT
SURGIFLO TRUKIT (HEMOSTASIS) IMPLANT
SUT VIC AB 1 CT1 27 (SUTURE) ×2
SUT VIC AB 1 CT1 27XBRD ANBCTR (SUTURE) ×2 IMPLANT
SUT VIC AB 2-0 CT1 18 (SUTURE) ×2 IMPLANT
SUT VIC AB 2-0 CTB1 (SUTURE) ×4 IMPLANT
SYR CONTROL 10ML LL (SYRINGE) ×2 IMPLANT
WATER STERILE IRR 1000ML POUR (IV SOLUTION) ×4 IMPLANT

## 2012-12-29 NOTE — Anesthesia Preprocedure Evaluation (Addendum)
Anesthesia Evaluation  Patient identified by MRN, date of birth, ID band Patient awake    Reviewed: Allergy & Precautions, H&P , NPO status , Patient's Chart, lab work & pertinent test results  Airway Mallampati: II TM Distance: >3 FB Neck ROM: Full    Dental  (+) Teeth Intact and Dental Advisory Given   Pulmonary  breath sounds clear to auscultation        Cardiovascular hypertension, Pt. on medications + Peripheral Vascular Disease + dysrhythmias + pacemaker Rhythm:Regular Rate:Normal     Neuro/Psych    GI/Hepatic GERD-  Medicated and Controlled,  Endo/Other  Hypothyroidism   Renal/GU      Musculoskeletal   Abdominal   Peds  Hematology   Anesthesia Other Findings   Reproductive/Obstetrics                          Anesthesia Physical Anesthesia Plan  ASA: III  Anesthesia Plan: General   Post-op Pain Management:    Induction: Intravenous  Airway Management Planned: Oral ETT  Additional Equipment:   Intra-op Plan:   Post-operative Plan: Extubation in OR  Informed Consent: I have reviewed the patients History and Physical, chart, labs and discussed the procedure including the risks, benefits and alternatives for the proposed anesthesia with the patient or authorized representative who has indicated his/her understanding and acceptance.   Dental advisory given  Plan Discussed with: CRNA, Anesthesiologist and Surgeon  Anesthesia Plan Comments:        Anesthesia Quick Evaluation

## 2012-12-29 NOTE — Progress Notes (Signed)
[name] ZOX:[id number] DOB: 08-01-1922 DOA: 12/27/2012 PCP: Catalina Pizza, MD  Brief narrative: 79 yo ? admitted from [facility] with L Inter-troch hip # 12/27/12-known h/o diastolic HF, Ef 70%06/04/11, symptomatic bradycardia s/p PPM [employer] Jude 06/07/11, hypothyroid, htn, dysphagia 2/2 to hiatal hernia hyponatremia, normocytic anemia-orthopedics dr. Shon Baton consulted to eval and Rx     Past medical history-As per Problem list Chart reviewed as below- See notes  Consultants:  Ortho  Procedures:  cxr  Hip xrays  Antibiotics:  none   Subjective  Alert pleasant. State [place] is significant on moving LLE NO n/v/cp/sob NPO for surgery   Objective    Interim History:   Telemetry: Non tele-requested tele 2/2 to brady  Objective: Filed Vitals:   12/28/12 1600 12/28/12 2225 12/29/12 0644 12/29/12 1524  BP:  135/40 146/45 154/42  Pulse:  80 85 108  Temp:  98.3 F (36.8 C) 98 F (36.7 C) 100.2 F (37.9 C)  TempSrc:      Resp: 16 16 16 16   Height:      Weight:      SpO2: 98% 98% 98% 99%    Intake/Output Summary (Last 24 hours) at 12/29/12 1553 Last data filed at 12/29/12 1500  Gross per 24 hour  Intake    240 ml  Output   2400 ml  Net  -2160 ml    Exam:  General: EOMI, NCAT Cardiovascular:  s1 s2  NSR  Respiratory: clear, no added sound Abdomen:  Soft, NT/ND Skin intact, LLE externally rotated Neurointact forssly.  Good memory  Data Reviewed: Basic Metabolic Panel:  Recent Labs Lab 12/27/12 1349 12/27/12 2200 12/28/12 0520  NA 128*  --  131*  K 4.2  --  4.0  CL 93*  --  98  CO2 24  --  24  GLUCOSE 173*  --  130*  BUN 16  --  18  CREATININE 1.09 1.19* 1.10  CALCIUM 9.2  --  8.1*   Liver Function Tests: No results found for this basename: AST, ALT, ALKPHOS, BILITOT, PROT, ALBUMIN,  in the last 168 hours No results found for this basename: LIPASE, AMYLASE,  in the last 168 hours No results found for this basename: AMMONIA,  in the last 168  hours CBC:  Recent Labs Lab 12/27/12 1349 12/27/12 2200 12/28/12 0520  WBC 6.4 12.9* 7.2  NEUTROABS 3.0  --   --   HGB 11.5* 7.4* 8.6*  HCT 33.2* 22.2* 24.6*  MCV 96.5 89.5 95.3  PLT 324 178 267   Cardiac Enzymes: No results found for this basename: CKTOTAL, CKMB, CKMBINDEX, TROPONINI,  in the last 168 hours BNP: No components found with this basename: POCBNP,  CBG: No results found for this basename: GLUCAP,  in the last 168 hours  Recent Results (from the past 240 hour(s))  SURGICAL PCR SCREEN     Status: Abnormal   Collection Time    12/28/12  9:37 PM      Result Value Range Status   MRSA, PCR NEGATIVE  NEGATIVE Final   Staphylococcus aureus POSITIVE (*) NEGATIVE Final   Comment:            The Xpert SA Assay (FDA     approved for NASAL specimens     in patients over 59 years of age),     is one component of     a comprehensive surveillance     program.  Test performance has     been validated by  [facility] for patients greater     than or equal to 104 year old.     It is not intended     to diagnose infection nor to     guide or monitor treatment.     RESULT CALLED TO, READ BACK BY AND VERIFIED WITH:     Cheral [place] RN [phone] 0149 EBANKS COLCLOUGH, S     Studies:              All Imaging reviewed and is as per above notation   Scheduled Meds: . amLODipine  5 mg Oral Daily  . aspirin EC  81 mg Oral Daily  .  ceFAZolin (ANCEF) IV  2 g Intravenous Once  . cycloSPORINE  1 drop Both Eyes BID  . docusate sodium  100 mg Oral QHS  . hydrALAZINE  25 mg Oral BID  . levothyroxine  88 mcg Oral QAC breakfast  . Linaclotide  145 mcg Oral BID  . lisinopril  20 mg Oral BID  . loratadine  10 mg Oral Daily  . mupirocin ointment   Nasal BID  . pantoprazole  40 mg Oral Daily  . polyethylene glycol  17 g Oral Daily   Continuous Infusions:     Assessment/Plan  1. Displaced intertrochanteric fracture of the left hip: Pain control, Weight bearing, Anticoag post-op  per orthopedics-For IM nailing later 9/8 2. Hyponatremia: Suspect dehydration. IV fluids. Sodium improved. Possible secondary to Vol depletion 3. Htn: Appears stable. Continue chronic Lisinopril 20 q am, Amlodipine 5 mg 4. Hypothyroidism: Continue Synthroid 88 MCg qam-rpt TSH 6-8 wk 5. Status post pacemaker placement-stable.  Started on tele  6. Chronic constipation: Will need attention in bowel regimen especially on narcotics. Continue Linzess.   Code Status: Full Family Communication:  None present currently Disposition Plan:  Inpt-UP with therapy per Ortho, ?CIR   Pleas Koch, MD  [facility] Pager [phone] 12/29/2012, 3:53 PM    LOS: 2 days

## 2012-12-29 NOTE — Anesthesia Procedure Notes (Signed)
Procedure Name: Intubation Date/Time: 12/29/2012 4:28 PM Performed by: Charm Barges, Oneika Simonian R Pre-anesthesia Checklist: Patient identified, Emergency Drugs available, Suction available, Patient being monitored and Timeout performed Patient Re-evaluated:Patient Re-evaluated prior to inductionOxygen Delivery Method: Circle system utilized Preoxygenation: Pre-oxygenation with 100% oxygen Intubation Type: IV induction Ventilation: Mask ventilation without difficulty Laryngoscope Size: Mac and 3 Grade View: Grade II Tube type: Oral Tube size: 7.5 mm Number of attempts: 1 Airway Equipment and Method: Stylet Placement Confirmation: ETT inserted through vocal cords under direct vision,  positive ETCO2 and breath sounds checked- equal and bilateral Secured at: 20 cm Tube secured with: Tape Dental Injury: Teeth and Oropharynx as per pre-operative assessment

## 2012-12-29 NOTE — Transfer of Care (Signed)
Immediate Anesthesia Transfer of Care Note  Patient: [name]  Procedure(s) Performed: Procedure(s): INTRAMEDULLARY (IM) NAIL INTERTROCHANTRIC (Left)  Patient Location: PACU  Anesthesia Type:General  Level of Consciousness: awake  Airway & Oxygen Therapy: Patient Spontanous Breathing and Patient connected to nasal cannula oxygen  Post-op Assessment: Report given to PACU RN, Post -op Vital signs reviewed and stable and Patient moving all extremities  Post vital signs: Reviewed and stable  Complications: No apparent anesthesia complications

## 2012-12-29 NOTE — Brief Op Note (Signed)
12/27/2012 - 12/29/2012  5:42 PM  PATIENT:  [name]  79 y.o. female  PRE-OPERATIVE DIAGNOSIS:  lt intertroch fx  POST-OPERATIVE DIAGNOSIS:  Left intertroch fracture  PROCEDURE:  Procedure(s): INTRAMEDULLARY (IM) NAIL INTERTROCHANTRIC (Left)  SURGEON:  Surgeon(s) and Role:    * Venita Lick, MD - Primary  PHYSICIAN ASSISTANT:   ASSISTANTS: none   ANESTHESIA:   general  EBL:  Total I/O In: 600 [I.V.:600] Out: 1900 [Urine:1500; Blood:400]  BLOOD ADMINISTERED:none  DRAINS: none   LOCAL MEDICATIONS USED:  MARCAINE     SPECIMEN:  No Specimen  DISPOSITION OF SPECIMEN:  N/A  COUNTS:  YES  TOURNIQUET:  * No tourniquets in log *  DICTATION: .Other Dictation: Dictation Number [id number]  PLAN OF CARE: Admit to inpatient   PATIENT DISPOSITION:  PACU - hemodynamically stable.

## 2012-12-29 NOTE — Progress Notes (Signed)
UR COMPLETED  

## 2012-12-29 NOTE — Progress Notes (Signed)
    Subjective:   Procedure(s) (LRB): INTRAMEDULLARY (IM) NAIL INTERTROCHANTRIC (Left) Patient reports pain as 4 on 0-10 scale.   Denies CP or SOB.  Voiding without difficulty. Positive flatus. Objective: Vital signs in last 24 hours: Temp:  [98 F (36.7 C)-98.6 F (37 C)] 98 F (36.7 C) (09/08 0644) Pulse Rate:  [76-85] 85 (09/08 0644) Resp:  [16-18] 16 (09/08 0644) BP: (135-146)/(40-53) 146/45 mmHg (09/08 0644) SpO2:  [97 %-98 %] 98 % (09/08 0644)  Intake/Output from previous day: 09/07 0701 - 09/08 0700 In: 240 [P.O.:240] Out: 1100 [Urine:1100] Intake/Output this shift:    Labs:  Recent Labs  12/27/12 1349 12/27/12 2200 12/28/12 0520  HGB 11.5* 7.4* 8.6*    Recent Labs  12/27/12 2200 12/28/12 0520  WBC 12.9* 7.2  RBC 2.48* 2.58*  HCT 22.2* 24.6*  PLT 178 267    Recent Labs  12/27/12 1349 12/27/12 2200 12/28/12 0520  NA 128*  --  131*  K 4.2  --  4.0  CL 93*  --  98  CO2 24  --  24  BUN 16  --  18  CREATININE 1.09 1.19* 1.10  GLUCOSE 173*  --  130*  CALCIUM 9.2  --  8.1*   No results found for this basename: LABPT, INR,  in the last 72 hours  Physical Exam: Neurologically intact Compartment soft  Assessment/Plan:  plan on surgery this afternoon No change in clinical exam  Dorotha Hirschi D for Dr. Venita Lick [facility] [phone] 12/29/2012, 12:11 PM

## 2012-12-29 NOTE — Preoperative (Signed)
Beta Blockers   Reason not to administer Beta Blockers:Not Applicable 

## 2012-12-29 NOTE — Anesthesia Postprocedure Evaluation (Signed)
  Anesthesia Post-op Note  Patient: [name]  Procedure(s) Performed: Procedure(s): INTRAMEDULLARY (IM) NAIL INTERTROCHANTRIC (Left)  Patient Location: PACU  Anesthesia Type:General  Level of Consciousness: awake, alert  and oriented  Airway and Oxygen Therapy: Patient Spontanous Breathing and Patient connected to nasal cannula oxygen  Post-op Pain: mild  Post-op Assessment: Post-op Vital signs reviewed  Post-op Vital Signs: Reviewed  Complications: No apparent anesthesia complications

## 2012-12-30 ENCOUNTER — Encounter (HOSPITAL_COMMUNITY): Payer: Self-pay | Admitting: Orthopedic Surgery

## 2012-12-30 LAB — CBC
HCT: 17.8 % — ABNORMAL LOW (ref 36.0–46.0)
Hemoglobin: 6.1 g/dL — CL (ref 12.0–15.0)
MCH: 32.8 pg (ref 26.0–34.0)
MCHC: 34.3 g/dL (ref 30.0–36.0)
MCV: 95.7 fL (ref 78.0–100.0)
RBC: 1.86 MIL/uL — ABNORMAL LOW (ref 3.87–5.11)

## 2012-12-30 LAB — BASIC METABOLIC PANEL
BUN: 13 mg/dL (ref 6–23)
CO2: 26 mEq/L (ref 19–32)
Calcium: 8.1 mg/dL — ABNORMAL LOW (ref 8.4–10.5)
Creatinine, Ser: 0.94 mg/dL (ref 0.50–1.10)
GFR calc non Af Amer: 53 mL/min — ABNORMAL LOW (ref >90)
Glucose, Bld: 125 mg/dL — ABNORMAL HIGH (ref 70–99)
Sodium: 129 mEq/L — ABNORMAL LOW (ref 135–145)

## 2012-12-30 LAB — PREPARE RBC (CROSSMATCH)

## 2012-12-30 LAB — HEMOGLOBIN AND HEMATOCRIT, BLOOD: Hemoglobin: 10.3 g/dL — ABNORMAL LOW (ref 12.0–15.0)

## 2012-12-30 MED ORDER — WHITE PETROLATUM GEL
Status: AC
  Administered 2012-12-30: 0.2
  Filled 2012-12-30: qty 5

## 2012-12-30 MED ORDER — HYDROCODONE-ACETAMINOPHEN 5-325 MG PO TABS
1.0000 | ORAL_TABLET | ORAL | Status: DC | PRN
  Administered 2012-12-30 – 2013-01-01 (×8): 2 via ORAL
  Filled 2012-12-30 (×8): qty 2

## 2012-12-30 MED ORDER — SORBITOL 70 % SOLN
15.0000 mL | Freq: Two times a day (BID) | Status: DC
  Administered 2012-12-30 – 2013-01-01 (×5): 15 mL via ORAL
  Filled 2012-12-30 (×6): qty 30

## 2012-12-30 NOTE — Op Note (Signed)
[name], RABON NO.:  [id number]  MEDICAL RECORD NO.:  [id number]  LOCATION:                               FACILITY:  [facility]  PHYSICIAN:  Alvy Beal, MD    DATE OF BIRTH:  1922-08-14  DATE OF PROCEDURE:  12/29/2012 DATE OF DISCHARGE:                              OPERATIVE REPORT   PREOPERATIVE DIAGNOSIS:  Left intertrochanteric hip fracture.  POSTOPERATIVE DIAGNOSIS:  Left intertrochanteric hip fracture.  OPERATIVE PROCEDURE:  Open reduction, internal fixation with a trochanteric entry nail.  COMPLICATIONS:  None.  CONDITION:  Stable.  INSTRUMENTATION SYSTEM:  Used was a Synthes 210 length trochanteric nail with a 105 lag screw.  HISTORY:  This is a very pleasant elderly woman who fell and suffered a hip fracture.  She was transferred from an outside institution, admitted for definitive fracture management.  Appropriate preoperative medical clearance was obtained.  Risks and benefits were reviewed with the patient and her son, and she consented to the aforementioned procedure.  OPERATIVE NOTE:  The patient was brought to the operating room, placed supine on the operating table.  After successful induction of general anesthesia and endotracheal intubation, the left lower extremity was placed into the traction.  The right was placed in the flexed abducted position in the well leg holder.  The left hip was prepped and draped in a standard fashion.  A gentle closed reduction maneuver was performed, and locked into place and satisfactory reduction was confirmed in the AP and lateral planes.  Once this was done, time-out was taken to confirm patient procedure and all other pertinent important data.  Once this was established, we then proceed with the surgery.  With the patient properly reduced, a incision was made along the superior aspect of the greater trochanter.  I sharply dissected down to the deep fascia.  I then advanced the guide pin into  the tip of the trochanter.  I malleted it through the tip of the trochanter and down into the proximal femur. I confirmed satisfactory position in both the AP and lateral planes. Once this was done, I then used the all-in-one step drill and drilled the proximal segment.  I then placed the intertrochanteric nail and malleted it down to the appropriate depth.  A second incision was made on the lateral side of the femur to allow for the portal for the trocar for the lag screw to be placed.  Once this was properly positioned, a guide pin was advanced through this and into the proximal femoral head. I confirmed satisfactory position in both the AP and lateral planes. Once I confirmed satisfactory position, I set my step drill to 105, and then drilled for the lag screw.  The lag screw was then placed over the guide pin to the appropriate depth.  I had excellent purchase and good fracture reduction with compression.  The locking nut was screwed down and then released a quarter turn.  I then removed the targeting trocar and then placed a second one for the lag screw.  Through the same lateral incision, the lag screw was placed.  This was a 40 mm length screws.  At this point, I confirmed AP and lateral planes.  Both of the reduction was satisfactory and the hardware was properly positioned. Once this was done, the inserting device for the nail was removed.  Both wounds were irrigated copiously with normal saline.  They were closed in layered fashion with #1 Vicryl sutures, 2-0 Vicryl sutures and staples.  A dry dressing was applied.  The patient was extubated, transferred to the PACU without incident.  At the end of the case, all needle and sponge counts were correct.  No adverse intraoperative event.     Alvy Beal, MD   ______________________________ Alvy Beal, MD    DDB/MEDQ  D:  12/29/2012  T:  12/30/2012  Job:  [id number]

## 2012-12-30 NOTE — Evaluation (Signed)
Occupational Therapy Evaluation Patient Details Name: [name] MRN: [id number] DOB: 04-05-1923 Today's Date: 12/30/2012 Time: 6962-9528 OT Time Calculation (min): 39 min  OT Assessment / Plan / Recommendation History of present illness Pt admitted after fall resulting in left greater trochanter fracture.  She underwent IM nailing on 12/29/12 via Dr. Shon Baton.     Clinical Impression   Pt currently needing total assist to total assist +2 for all bed mobility and selfcare.  Only able to tolerate minimal LLE movement to the side of the bed during session.  She was not able to tolerate attempt to sit EOB this session secondary to pain and tolerated only limited activity during PT session.  Feel currently based on pt's pain level and limited activity tolerance that she will likely need SNF for follow-up rehab for discharge.  Will benefit from acute care OT while in the hospital to help increase independence with basic selfcare tasks.    OT Assessment  Patient needs continued OT Services    Follow Up Recommendations  SNF    Barriers to Discharge Decreased caregiver support    Equipment Recommendations  3 in 1 bedside comode       Frequency  Min 2X/week    Precautions / Restrictions Precautions Precautions: Fall Restrictions Weight Bearing Restrictions: No LLE Weight Bearing: Weight bearing as tolerated   Pertinent Vitals/Pain Pain 8/10 in her left hip, repositioned and notified nursing of pain O2 sats 95% on room air    ADL  Eating/Feeding: Simulated;Independent Where Assessed - Eating/Feeding: Bed level Grooming: Simulated;Set up Where Assessed - Grooming: Supine, head of bed up Upper Body Bathing: Simulated;Set up Where Assessed - Upper Body Bathing: Supine, head of bed up Lower Body Bathing: +2 Total assistance;Simulated Lower Body Bathing: Patient Percentage: 20% Where Assessed - Lower Body Bathing: Supine, head of bed up;Supine, head of bed flat Transfers/Ambulation  Related to ADLs: Pt unable to transfer this visit secondary to pain. ADL Comments: Limited OT evaluation.  Pt needing total assist to move the LLE toward the EOB and unable to attempt sitting up secondary to having too much pain in the left hip.  Nursing aware and muscle relaxer was given approximately 30 mins before session.    OT Diagnosis: Generalized weakness;Acute pain  OT Problem List: Decreased strength;Decreased range of motion;Decreased activity tolerance;Impaired balance (sitting and/or standing);Decreased knowledge of use of DME or AE;Pain OT Treatment Interventions: Self-care/ADL training;Patient/family education;Balance training;Therapeutic activities;DME and/or AE instruction   OT Goals(Current goals can be found in the care plan section) Acute Rehab OT Goals Patient Stated Goal: To have her pain decreased when she moves. OT Goal Formulation: With patient/family Time For Goal Achievement: 01/13/13 Potential to Achieve Goals: Fair  Visit Information  Last OT Received On: 12/30/12 Assistance Needed: +2 History of Present Illness: Pt admitted after fall resulting in left greater trochanter fracture.  She underwent IM nailing on 12/29/12 via Dr. Shon Baton.         Prior Functioning     Home Living Family/patient expects to be discharged to:: Assisted living Home Equipment: Dan Humphreys - 2 wheels;Shower seat - built in Prior Function Level of Independence: Independent Comments: [place] house takes care of all of her meals Communication Communication: No difficulties         Vision/Perception Vision - History Visual History: Macular degeneration Patient Visual Report: No change from baseline Vision - Assessment Vision Assessment: Vision not tested Perception Perception: Within Functional Limits Praxis Praxis: Intact   Cognition  Cognition Arousal/Alertness: Awake/alert  Behavior During Therapy: WFL for tasks assessed/performed Overall Cognitive Status: Within  Functional Limits for tasks assessed    Extremity/Trunk Assessment Upper Extremity Assessment Upper Extremity Assessment: Overall WFL for tasks assessed Lower Extremity Assessment Lower Extremity Assessment: Defer to PT evaluation     Mobility Bed Mobility Bed Mobility: Scooting to HOB Scooting to HOB: 1: +2 Total assist Scooting to [facility]: Patient Percentage: 10%        Balance Balance Balance Assessed: No   End of Session OT - End of Session Activity Tolerance: Patient limited by pain Patient left: in bed;with call bell/phone within reach;with nursing/sitter in room Nurse Communication: Mobility status;Patient requests pain meds     Antwion Carpenter OTR/L Pager number [phone] 12/30/2012, 4:02 PM

## 2012-12-30 NOTE — Progress Notes (Signed)
Notified by lab of critical hemoglobin 6.1  On-call hospitalist notified.  New orders to transfuse 2u PRBC and to recheck H/H 2 hrs after transfusion.  BP 99/44, skin pale, denies dizziness.  Will keep foley in at this time d/t blood transfusion and limited mobility with low hemoglobin and have RN/MD to reassess.

## 2012-12-30 NOTE — Evaluation (Signed)
Physical Therapy Evaluation Patient Details Name: [name] MRN: [id number] DOB: [birth year] Today's Date: 12/30/2012 Time: 2130-8657 PT Time Calculation (min): 36 min  PT Assessment / Plan / Recommendation History of Present Illness  Pt admitted after fall resulting in left greater trochanter fracture.  She underwent IM nailing on 12/29/12 via Dr. Shon Baton.    Clinical Impression  Patient is s/p above surgery resulting in functional limitations due to the deficits listed below (see PT Problem List).   Patient will benefit from skilled PT to increase their independence and safety with mobility to allow discharge to the venue listed below.        PT Assessment  Patient needs continued PT services    Follow Up Recommendations  SNF    Does the patient have the potential to tolerate intense rehabilitation      Barriers to Discharge Decreased caregiver support      Equipment Recommendations  Rolling walker with 5" wheels;3in1 (PT)    Recommendations for Other Services     Frequency Min 5X/week    Precautions / Restrictions Precautions Precautions: Fall Restrictions Weight Bearing Restrictions: No LLE Weight Bearing: Weight bearing as tolerated   Pertinent Vitals/Pain 10+/10 Pain L hip patient repositioned for comfort RN notified      Mobility  Bed Mobility Bed Mobility: Sit to Supine Sit to Supine: 1: +2 Total assist Sit to Supine: Patient Percentage: 10% Scooting to HOB: 1: +2 Total assist Scooting to [facility]: Patient Percentage: 10% Details for Bed Mobility Assistance: Cues for technique; Pt in quite a lot of pain, and therefore unable to help with laying down; Required assist with laying trunk down and assisting LEs into bed Transfers Transfers: Sit to Stand;Stand to Sit Sit to Stand: 1: +2 Total assist;With armrests;From chair/3-in-1 Sit to Stand: Patient Percentage: 60% Stand to Sit: 1: +2 Total assist;To bed Stand to Sit: Patient Percentage: 60% Details for  Transfer Assistance: cues for hand palcement and safety; physical assist to power up Ambulation/Gait Ambulation/Gait Assistance: 1: +2 Total assist Ambulation/Gait: Patient Percentage: 60% Ambulation Distance (Feet): 3 Feet (chair to bed) Assistive device: Rolling walker Ambulation/Gait Assistance Details: Small, pivot steps chair to bed; verbal and demo cues for sequence and to push down into RW to unweigh painful LLE; Difficulty stepping RLE as pt did not accept weight onto LLE    Exercises     PT Diagnosis: Difficulty walking;Acute pain  PT Problem List: Decreased strength;Decreased range of motion;Decreased activity tolerance;Decreased balance;Decreased mobility;Decreased knowledge of use of DME;Pain PT Treatment Interventions: DME instruction;Gait training;Functional mobility training;Therapeutic activities;Therapeutic exercise;Patient/family education     PT Goals(Current goals can be found in the care plan section) Acute Rehab PT Goals Patient Stated Goal: To have her pain decreased when she moves. PT Goal Formulation: With patient Time For Goal Achievement: 01/13/13 Potential to Achieve Goals: Good  Visit Information  Last PT Received On: 12/30/12 Assistance Needed: +2 History of Present Illness: Pt admitted after fall resulting in left greater trochanter fracture.  She underwent IM nailing on 12/29/12 via Dr. Shon Baton.         Prior Functioning  Home Living Family/patient expects to be discharged to:: Assisted living Home Equipment: Dan Humphreys - 2 wheels;Shower seat - built in Prior Function Level of Independence: Independent Comments: [facility] takes care of all of her meals Communication Communication: No difficulties    Cognition  Cognition Arousal/Alertness: Awake/alert Behavior During Therapy: WFL for tasks assessed/performed Overall Cognitive Status: Within Functional Limits for tasks assessed  Extremity/Trunk Assessment Upper Extremity Assessment Upper  Extremity Assessment: Overall WFL for tasks assessed Lower Extremity Assessment Lower Extremity Assessment: LLE deficits/detail LLE: Unable to fully assess due to pain   Balance Balance Balance Assessed: No  End of Session PT - End of Session Equipment Utilized During Treatment: Gait belt Activity Tolerance: Patient limited by pain Patient left: in bed;with call bell/phone within reach;with family/visitor present Nurse Communication: Mobility status  GP     Olen Pel [place], [place] [phone]  12/30/2012, 4:42 PM

## 2012-12-30 NOTE — Progress Notes (Signed)
Subjective: Doing ok this morning.  Pain controlled.  Being transfused for Hgb 6.1.  No complaints of CP, sob.    Objective: Vital signs in last 24 hours: Temp:  [97.3 F (36.3 C)-100.2 F (37.9 C)] 97.3 F (36.3 C) (09/09 1225) Pulse Rate:  [71-108] 86 (09/09 1225) Resp:  [10-18] 18 (09/09 1225) BP: (99-158)/(36-76) 144/76 mmHg (09/09 1225) SpO2:  [90 %-100 %] 100 % (09/09 1225)  Intake/Output from previous day: 09/08 0701 - 09/09 0700 In: 2200 [P.O.:480; I.V.:1620; IV Piggyback:100] Out: 2450 [Urine:2050; Blood:400] Intake/Output this shift: Total I/O In: 280 [Blood:280] Out: -    Recent Labs  12/27/12 2200 12/28/12 0520 12/30/12 0430  HGB 7.4* 8.6* 6.1*    Recent Labs  12/28/12 0520 12/30/12 0430  WBC 7.2 9.2  RBC 2.58* 1.86*  HCT 24.6* 17.8*  PLT 267 201    Recent Labs  12/28/12 0520 12/30/12 0430  NA 131* 129*  K 4.0 4.6  CL 98 97  CO2 24 26  BUN 18 13  CREATININE 1.10 0.94  GLUCOSE 130* 125*  CALCIUM 8.1* 8.1*   No results found for this basename: LABPT, INR,  in the last 72 hours  Exam:  Dressing c/d/i.  Calf nt, nvi.    Assessment/Plan: Start PT today.  Anticipate transfer back to assisted living facility Friday or Saturday if stable.     Everitt Wenner M 12/30/2012, 1:52 PM

## 2012-12-30 NOTE — Progress Notes (Signed)
[name] ZOX:[id number] DOB: [birth year] DOA: 12/27/2012 PCP: Catalina Pizza, MD  Brief narrative: 79 yo ? admitted from [facility] with L Inter-troch hip # 12/27/12-known h/o diastolic HF, Ef 70%06/04/11, symptomatic bradycardia s/p PPM [facility] [facility] 06/07/11, hypothyroid, htn, dysphagia 2/2 to hiatal hernia hyponatremia, normocytic anemia-orthopedics dr. Shon Baton consulted to eval and Rx-she underwent IM nailing of L hip 9/8    Past medical history-As per Problem list Chart reviewed as below- See notes  Consultants:  Ortho  Procedures:  cxr  Hip xrays  Antibiotics:  none   Subjective  Alert pleasant.  Son at bedside. No sob, cp, dizzy, blurred or double vision Cp, n Tolerating 50-75% diet   Objective    Interim History: Noted Hb 6.1, Na 129   Telemetry: Non tele-requested tele 2/2 to brady  Objective: Filed Vitals:   12/30/12 0158 12/30/12 0624 12/30/12 0658 12/30/12 0725  BP: 123/48 99/44 109/39 106/58  Pulse: 92 71 75 87  Temp: 98 F (36.7 C) 98.4 F (36.9 C) 98.1 F (36.7 C) 98.5 F (36.9 C)  TempSrc: Oral Oral Oral   Resp: 18 18 18    Height:      Weight:      SpO2: 100% 100% 97% 97%    Intake/Output Summary (Last 24 hours) at 12/30/12 0758 Last data filed at 12/30/12 0715  Gross per 24 hour  Intake   1145 ml  Output   2450 ml  Net  -1305 ml    Exam:  General: EOMI, NCAT Cardiovascular:  s1 s2  NSR  Respiratory: clear, no added sound Abdomen:  Soft, NT/ND Skin intact, LLE externally rotated-wound clear. Neuro: intact .  Good memory  Data Reviewed: Basic Metabolic Panel:  Recent Labs Lab 12/27/12 1349 12/27/12 2200 12/28/12 0520 12/30/12 0430  NA 128*  --  131* 129*  K 4.2  --  4.0 4.6  CL 93*  --  98 97  CO2 24  --  24 26  GLUCOSE 173*  --  130* 125*  BUN 16  --  18 13  CREATININE 1.09 1.19* 1.10 0.94  CALCIUM 9.2  --  8.1* 8.1*   Liver Function Tests: No results found for this basename: AST, ALT, ALKPHOS, BILITOT, PROT, ALBUMIN,  in  the last 168 hours No results found for this basename: LIPASE, AMYLASE,  in the last 168 hours No results found for this basename: AMMONIA,  in the last 168 hours CBC:  Recent Labs Lab 12/27/12 1349 12/27/12 2200 12/28/12 0520 12/30/12 0430  WBC 6.4 12.9* 7.2 9.2  NEUTROABS 3.0  --   --   --   HGB 11.5* 7.4* 8.6* 6.1*  HCT 33.2* 22.2* 24.6* 17.8*  MCV 96.5 89.5 95.3 95.7  PLT 324 178 267 201   Cardiac Enzymes: No results found for this basename: CKTOTAL, CKMB, CKMBINDEX, TROPONINI,  in the last 168 hours BNP: No components found with this basename: POCBNP,  CBG: No results found for this basename: GLUCAP,  in the last 168 hours  Recent Results (from the past 240 hour(s))  SURGICAL PCR SCREEN     Status: Abnormal   Collection Time    12/28/12  9:37 PM      Result Value Range Status   MRSA, PCR NEGATIVE  NEGATIVE Final   Staphylococcus aureus POSITIVE (*) NEGATIVE Final   Comment:            The Xpert SA Assay (FDA     approved for NASAL specimens  in patients over 53 years of age),     is one component of     a comprehensive surveillance     program.  Test performance has     been validated by [employer] for patients greater     than or equal to 57 year old.     It is not intended     to diagnose infection nor to     guide or monitor treatment.     RESULT CALLED TO, READ BACK BY AND VERIFIED WITH:     Marilyn Bay RN [phone] EBANKS COLCLOUGH, S     Studies:              All Imaging reviewed and is as per above notation   Scheduled Meds: . aspirin EC  325 mg Oral Q breakfast  . docusate sodium  100 mg Oral BID  . enoxaparin (LOVENOX) injection  40 mg Subcutaneous Q24H  . ferrous sulfate  325 mg Oral TID PC   Continuous Infusions:      Assessment/Plan  1. Displaced intertrochanteric fracture of the left hip: Pain control, Weight bearing, Anticoag post-op per orthopedics-For IM nailing later 9/8 2. Anemia of acute blood loss 2/2 to surgery?   Possible dilutional compnent-tranfusing 2 u PRBC-rpt cbc 2 hr post tx, then rpt am. 3. Hyponatremia: Suspect dehydration. IV fluids held.  Fluid restrict 1.5 liters, rpt bmet am 4. Htn: Appears stable. Continue chronic Lisinopril 20 q am, Amlodipine 5 mg 5. Hypothyroidism: Continue Synthroid 88 MCg qam-rpt TSH 6-8 wk 6. Status post pacemaker placement-stable.  Started on tele-this was d/c 12/30/12 as no arrythmias 7. Chronic constipation: Will need attention in bowel regimen especially on narcotics. Continue Linzess.  Consider addition sorbitol prn if no stool 1-2 days   Code Status: Full Family Communication:  None present currently Disposition Plan:  Inpt-UP with therapy WBAT-likely SNF-Social woorker to be made aware   Pleas Koch, MD  [facility] Pager [phone] 12/30/2012, 7:58 AM    LOS: 3 days

## 2012-12-30 NOTE — Progress Notes (Signed)
SW spoke with patient and patient's son. Patient would like to return to [facility] with in house PT. However, SNF might be a better option. Patient and patient's son are aware of this and are agreeable to going to SNF. [facility] is there first choice. SW will continue to follow and will complete a comprehensive assessment in the am.  Sabino Niemann, MSW, [employer] [phone]

## 2012-12-31 DIAGNOSIS — M79609 Pain in unspecified limb: Secondary | ICD-10-CM

## 2012-12-31 DIAGNOSIS — S72009D Fracture of unspecified part of neck of unspecified femur, subsequent encounter for closed fracture with routine healing: Secondary | ICD-10-CM

## 2012-12-31 DIAGNOSIS — D649 Anemia, unspecified: Secondary | ICD-10-CM

## 2012-12-31 DIAGNOSIS — I498 Other specified cardiac arrhythmias: Secondary | ICD-10-CM

## 2012-12-31 DIAGNOSIS — N289 Disorder of kidney and ureter, unspecified: Secondary | ICD-10-CM

## 2012-12-31 DIAGNOSIS — K5900 Constipation, unspecified: Secondary | ICD-10-CM

## 2012-12-31 LAB — TYPE AND SCREEN
ABO/RH(D): O POS
Antibody Screen: NEGATIVE
Unit division: 0
Unit division: 0

## 2012-12-31 LAB — CBC
Hemoglobin: 8.9 g/dL — ABNORMAL LOW (ref 12.0–15.0)
MCH: 30.7 pg (ref 26.0–34.0)
RBC: 2.9 MIL/uL — ABNORMAL LOW (ref 3.87–5.11)
WBC: 6.5 10*3/uL (ref 4.0–10.5)

## 2012-12-31 LAB — BASIC METABOLIC PANEL
CO2: 27 mEq/L (ref 19–32)
Calcium: 8.1 mg/dL — ABNORMAL LOW (ref 8.4–10.5)
Chloride: 96 mEq/L (ref 96–112)
Glucose, Bld: 121 mg/dL — ABNORMAL HIGH (ref 70–99)
Potassium: 4.2 mEq/L (ref 3.5–5.1)
Sodium: 130 mEq/L — ABNORMAL LOW (ref 135–145)

## 2012-12-31 MED ORDER — POLYETHYLENE GLYCOL 3350 17 G PO PACK
17.0000 g | PACK | Freq: Every day | ORAL | Status: DC
  Administered 2012-12-31 – 2013-01-01 (×2): 17 g via ORAL
  Filled 2012-12-31 (×3): qty 1

## 2012-12-31 MED ORDER — FLEET ENEMA 7-19 GM/118ML RE ENEM
1.0000 | ENEMA | Freq: Once | RECTAL | Status: AC
  Administered 2012-12-31: 1 via RECTAL
  Filled 2012-12-31: qty 1

## 2012-12-31 NOTE — Progress Notes (Signed)
Physical Therapy Treatment Patient Details Name: [name] MRN: [id number] DOB: 12-09-1922 Today's Date: 12/31/2012 Time: 4166-0630 PT Time Calculation (min): 23 min  PT Assessment / Plan / Recommendation  History of Present Illness Pt admitted after fall resulting in left greater trochanter fracture.  She underwent IM nailing on 12/29/12 via Dr. Shon Baton.     PT Comments   Continuing progress, moves slowly, and benefits from step-by-step cueing Much improved activity tolerance from eval   Follow Up Recommendations  SNF     Does the patient have the potential to tolerate intense rehabilitation     Barriers to Discharge        Equipment Recommendations  Rolling walker with 5" wheels;3in1 (PT)    Recommendations for Other Services    Frequency Min 5X/week   Progress towards PT Goals Progress towards PT goals: Progressing toward goals  Plan Current plan remains appropriate    Precautions / Restrictions Precautions Precautions: Fall Restrictions LLE Weight Bearing: Weight bearing as tolerated   Pertinent Vitals/Pain 5/10 L Hip, especially with WBing    Mobility  Bed Mobility Bed Mobility: Supine to Sit;Sitting - Scoot to Edge of Bed Supine to Sit: 3: Mod assist;With rails Sitting - Scoot to Edge of Bed: 3: Mod assist (with bed pad) Details for Bed Mobility Assistance: Step-by-step cues for technique; Mod assist mostly to acheive fully upright sitting and square hips at EOB; Pt was ableto initiate lifting trunk from bed Transfers Transfers: Sit to Stand;Stand to Sit Sit to Stand: 3: Mod assist;From bed Stand to Sit: 4: Min assist;To chair/3-in-1 Details for Transfer Assistance: cues for hand palcement and safety; physical assist to power up Ambulation/Gait Ambulation/Gait Assistance: 3: Mod assist Ambulation Distance (Feet): 6 Feet Assistive device: Rolling walker Ambulation/Gait Assistance Details: Cues for gait sequence and for optimal step length, and weight shift  into stance (R and L); Physical assist to progress RW Gait Pattern: Step-to pattern    Exercises Total Joint Exercises Ankle Circles/Pumps: AROM;Both;20 reps Quad Sets: AROM;Left;10 reps Gluteal Sets: AROM;Both;10 reps Heel Slides: AAROM;Left;10 reps   PT Diagnosis:    PT Problem List:   PT Treatment Interventions:     PT Goals (current goals can now be found in the care plan section) Acute Rehab PT Goals Patient Stated Goal: To have her pain decreased when she moves. PT Goal Formulation: With patient Time For Goal Achievement: 01/13/13 Potential to Achieve Goals: Good  Visit Information  Last PT Received On: 12/31/12 Assistance Needed: +1 History of Present Illness: Pt admitted after fall resulting in left greater trochanter fracture.  She underwent IM nailing on 12/29/12 via Dr. Shon Baton.      Subjective Data  Subjective: Seemed pleased with her performance at end of sesion Patient Stated Goal: To have her pain decreased when she moves.   Cognition  Cognition Arousal/Alertness: Awake/alert Behavior During Therapy: WFL for tasks assessed/performed Overall Cognitive Status: Within Functional Limits for tasks assessed    Balance     End of Session PT - End of Session Equipment Utilized During Treatment: Gait belt Activity Tolerance: Patient tolerated treatment well Patient left: in chair;with call bell/phone within reach Nurse Communication: Mobility status   GP     [name] [place], [place] [phone]  12/31/2012, 4:04 PM

## 2012-12-31 NOTE — Progress Notes (Signed)
Subjective: Doing well.  Better after transfusion.  Hip pain controlled.  C/o some left calf pain and thigh spasms.    Objective: Vital signs in last 24 hours: Temp:  [97.3 F (36.3 C)-98.5 F (36.9 C)] 97.9 F (36.6 C) (09/10 0644) Pulse Rate:  [75-89] 75 (09/10 0644) Resp:  [16-18] 18 (09/10 0644) BP: (101-144)/(36-76) 120/49 mmHg (09/10 0644) SpO2:  [90 %-100 %] 96 % (09/10 0644)  Intake/Output from previous day: 09/09 0701 - 09/10 0700 In: 2130 [P.O.:1150; Blood:980] Out: 600 [Urine:600] Intake/Output this shift:     Recent Labs  12/30/12 0430 12/30/12 1520 12/31/12 0445  HGB 6.1* 10.3* 8.9*    Recent Labs  12/30/12 0430 12/30/12 1520 12/31/12 0445  WBC 9.2  --  6.5  RBC 1.86*  --  2.90*  HCT 17.8* 29.0* 25.3*  PLT 201  --  171    Recent Labs  12/30/12 0430 12/31/12 0445  NA 129* 130*  K 4.6 4.2  CL 97 96  CO2 26 27  BUN 13 15  CREATININE 0.94 0.96  GLUCOSE 125* 121*  CALCIUM 8.1* 8.1*   No results found for this basename: LABPT, INR,  in the last 72 hours  Exam:  Wounds look good.  Staples intact.  Mod-marked left calf TTP.  Neurologically intact.    Assessment/Plan: Will get venous doppler left LE to r/o DVT today.  Currently on ASA.  Will see how she does with PT.  If she feels lightheaded/fatigued, she may need one more unit of PRBC's.  Agree with transfer to SNF for rehab before return to assisted facility.  Anticipate transfer tomorrow or Friday if stable and bed available.     Luva Metzger M 12/31/2012, 8:33 AM

## 2012-12-31 NOTE — Progress Notes (Addendum)
Patient ID: [name]  female  WUJ:[id number]    DOB: 77/01/1923    DOA: 12/27/2012  PCP: Catalina Pizza, MD  Assessment/Plan: Principal Problem:   Closed left hip fracture - Status post ORIF with trochanteric and drainage on 12/29/2012 - Pain somewhat controlled, start physical therapy - Doppler US LLE showed no DVT  Active Problems:  Chronic Constipation: - Per pt, last BM on Friday 5 days ago - Currently on sorbitol, continue Colace, add MiraLax, enema today - Discussed with the family member to bring linzess from home.    ANEMIA - Hemoglobin 8.9 today - Will start physical therapy, if she still feels dizzy and fatigued, will need another unit of packed RBC transfusion     Hypertension: Stable     Hypothyroidism: Continue Synthroid     Pacemaker    Hyponatremia improving   DVT Prophylaxis: SCDs, Lovenox   Code Status:  Disposition:Await physical therapy evaluation today, will need skilled nursing facility. Discussed with patient and her family member at the bedside, interested in [facility] in [place]. Hopefully tomorrow.    Subjective: Complaining of constipation, pain currently controlled  objective: Weight change:   Intake/Output Summary (Last 24 hours) at 12/31/12 1422 Last data filed at 12/31/12 1345  Gross per 24 hour  Intake   1710 ml  Output    200 ml  Net   1510 ml   Blood pressure 131/53, pulse 88, temperature 98.6 F (37 C), temperature source Oral, resp. rate 16, height 5\' 5"  (1.651 m), weight 65.499 kg (144 lb 6.4 oz), SpO2 98.00%.  Physical Exam: General: Alert and awake, oriented x3, not in any acute distress. CVS: S1-S2 clear, no murmur rubs or gallops Chest: clear to auscultation bilaterally Abdomen: soft nontender, nondistended, normal bowel sounds  Extremities: no cyanosis, clubbing or edema noted bilaterally   Lab Results: Basic Metabolic Panel:  Recent Labs Lab 12/30/12 0430 12/31/12 0445  NA 129* 130*  K 4.6 4.2  CL 97 96   CO2 26 27  GLUCOSE 125* 121*  BUN 13 15  CREATININE 0.94 0.96  CALCIUM 8.1* 8.1*   Liver Function Tests: No results found for this basename: AST, ALT, ALKPHOS, BILITOT, PROT, ALBUMIN,  in the last 168 hours No results found for this basename: LIPASE, AMYLASE,  in the last 168 hours No results found for this basename: AMMONIA,  in the last 168 hours CBC:  Recent Labs Lab 12/27/12 1349  12/30/12 0430 12/30/12 1520 12/31/12 0445  WBC 6.4  < > 9.2  --  6.5  NEUTROABS 3.0  --   --   --   --   HGB 11.5*  < > 6.1* 10.3* 8.9*  HCT 33.2*  < > 17.8* 29.0* 25.3*  MCV 96.5  < > 95.7  --  87.2  PLT 324  < > 201  --  171  < > = values in this interval not displayed. Cardiac Enzymes: No results found for this basename: CKTOTAL, CKMB, CKMBINDEX, TROPONINI,  in the last 168 hours BNP: No components found with this basename: POCBNP,  CBG: No results found for this basename: GLUCAP,  in the last 168 hours   Micro Results: Recent Results (from the past 240 hour(s))  SURGICAL PCR SCREEN     Status: Abnormal   Collection Time    12/28/12  9:37 PM      Result Value Range Status   MRSA, PCR NEGATIVE  NEGATIVE Final   Staphylococcus aureus POSITIVE (*) NEGATIVE Final  Comment:            The Xpert SA Assay (FDA     approved for NASAL specimens     in patients over 53 years of age),     is one component of     a comprehensive surveillance     program.  Test performance has     been validated by [employer] for patients greater     than or equal to 22 year old.     It is not intended     to diagnose infection nor to     guide or monitor treatment.     RESULT CALLED TO, READ BACK BY AND VERIFIED WITH:     Cheral Bay RN [phone] 0149 EBANKS COLCLOUGH, S    Studies/Results: Dg Chest 1 View  12/27/2012   *RADIOLOGY REPORT*  Clinical Data: Possible hip fracture.  CHEST - 1 VIEW  Comparison: 08/02/2011  Findings: Left-sided pacemaker is unchanged.  Lungs are adequately inflated  without consolidation or effusion.  There is mild stable cardiomegaly.  Remainder of the exam is unchanged.  IMPRESSION: No acute cardiopulmonary disease.  Mild stable cardiomegaly.   Original Report Authenticated By: Elberta Fortis, M.D.   Dg Hip Complete Left  12/27/2012   *RADIOLOGY REPORT*  Clinical Data: Possible left hip fracture post fall.  LEFT HIP - COMPLETE 2+ VIEW  Comparison: 12/11/2006  Findings: There is diffuse decreased bone density.  There is a displaced intertrochanteric fracture of the left hip with resulting coxa varum deformity.  Remainder of the exam is unchanged.  IMPRESSION: Displaced intertrochanteric fracture of the left hip.   Original Report Authenticated By: Elberta Fortis, M.D.   Dg Hip Operative Left  12/30/2012   *RADIOLOGY REPORT*  Clinical Data: Status post ORIF of left hip fracture.  OPERATIVE LEFT HIP  Comparison: 12/27/2012.  Findings: Four intraoperative views document gamma nail fixation of a left-sided intertrochanteric hip fracture.  The hardware appears properly located, without definite periprosthetic fracture or other immediate complicating features.  The femoral head is located.  IMPRESSION: 1.  Status post gamma nail fixation for left intratrochanteric hip fracture without complicating features, as above.   Original Report Authenticated By: Trudie Reed, M.D.   US Abdomen Complete  12/23/2012   *RADIOLOGY REPORT*  Clinical Data:  Abdominal pain and bloating  COMPLETE ABDOMINAL ULTRASOUND  Comparison:  CT abdomen/pelvis 12/01/2011  Findings:  Gallbladder:  No gallstones, gallbladder wall thickening, or pericholecystic fluid. Per the sonographer, the sonographic Murphy's sign was negative.  Common bile duct:  Within normal limits at 3.8 mm in caliber.  Liver:  No focal lesion identified.  Within normal limits in parenchymal echogenicity.  IVC:  Appears normal.  Pancreas:  No focal abnormality seen.  The tail is partially obscured by overlying bowel gas.  Spleen:   Sonographically unremarkable.  The spleen is normal in size at 6.2 cm in length.  Right Kidney:  Normal reniform contour without hydronephrosis or nephrolithiasis.  9.2 cm in length.  No solid renal mass.  Left Kidney:  Normal reniform contour without hydronephrosis or nephrolithiasis.  9.4 cm in length.  No solid renal mass.  Abdominal aorta:  No aneurysmal dilatation.  Atherosclerotic vascular calcifications noted.  IMPRESSION:  Negative abdominal ultrasound.   Original Report Authenticated By: Malachy Moan, M.D.   Dg Pelvis Portable  12/29/2012   *RADIOLOGY REPORT*  Clinical Data: Status post left hip surgery  PORTABLE PELVIS  Comparison: 12/27/2012  Findings:  There is been interval ORIF of the intertrochanteric fracture involving the proximal left femur.  Screw and intramedullary rod device is identified.  The hardware components and fracture fragments are in anatomic alignment.  IMPRESSION:  1.  Status post ORIF of intertrochanteric fracture involving the proximal left femur.   Original Report Authenticated By: Signa Kell, M.D.    Medications: Scheduled Meds: . aspirin EC  325 mg Oral Q breakfast  . docusate sodium  100 mg Oral BID  . enoxaparin (LOVENOX) injection  40 mg Subcutaneous Q24H  . ferrous sulfate  325 mg Oral TID PC  . sorbitol  15 mL Oral BID      LOS: 4 days   RAI,RIPUDEEP M.D. [facility] 12/31/2012, 2:22 PM Pager: [phone]  If 7PM-7AM, please contact night-coverage [email or web address] Password [email or web address]

## 2012-12-31 NOTE — Progress Notes (Signed)
VASCULAR LAB PRELIMINARY  PRELIMINARY  PRELIMINARY  PRELIMINARY  Left lower extremity venous duplex completed.    Preliminary report:  Left:  No evidence of DVT, superficial thrombosis, or Baker's cyst.  Rodney Wigger, RVS 12/31/2012, 11:58 AM

## 2013-01-01 ENCOUNTER — Other Ambulatory Visit: Payer: Self-pay | Admitting: *Deleted

## 2013-01-01 ENCOUNTER — Encounter (HOSPITAL_COMMUNITY): Payer: Self-pay | Admitting: Emergency Medicine

## 2013-01-01 ENCOUNTER — Inpatient Hospital Stay
Admission: RE | Admit: 2013-01-01 | Discharge: 2013-01-26 | Disposition: A | Discharge status: Home / Self Care | Payer: Medicare Other | Source: Ambulatory Visit | Attending: Internal Medicine | Admitting: Internal Medicine

## 2013-01-01 DIAGNOSIS — Z9181 History of falling: Secondary | ICD-10-CM | POA: Diagnosis not present

## 2013-01-01 DIAGNOSIS — R55 Syncope and collapse: Secondary | ICD-10-CM | POA: Diagnosis not present

## 2013-01-01 DIAGNOSIS — S72009A Fracture of unspecified part of neck of unspecified femur, initial encounter for closed fracture: Secondary | ICD-10-CM | POA: Diagnosis not present

## 2013-01-01 DIAGNOSIS — IMO0002 Reserved for concepts with insufficient information to code with codable children: Secondary | ICD-10-CM | POA: Diagnosis not present

## 2013-01-01 DIAGNOSIS — N289 Disorder of kidney and ureter, unspecified: Secondary | ICD-10-CM | POA: Diagnosis not present

## 2013-01-01 DIAGNOSIS — M25559 Pain in unspecified hip: Secondary | ICD-10-CM | POA: Diagnosis not present

## 2013-01-01 DIAGNOSIS — I10 Essential (primary) hypertension: Secondary | ICD-10-CM | POA: Diagnosis not present

## 2013-01-01 DIAGNOSIS — S72009D Fracture of unspecified part of neck of unspecified femur, subsequent encounter for closed fracture with routine healing: Secondary | ICD-10-CM | POA: Diagnosis not present

## 2013-01-01 DIAGNOSIS — R279 Unspecified lack of coordination: Secondary | ICD-10-CM | POA: Diagnosis not present

## 2013-01-01 DIAGNOSIS — I498 Other specified cardiac arrhythmias: Secondary | ICD-10-CM | POA: Diagnosis not present

## 2013-01-01 DIAGNOSIS — S72143A Displaced intertrochanteric fracture of unspecified femur, initial encounter for closed fracture: Secondary | ICD-10-CM | POA: Diagnosis not present

## 2013-01-01 DIAGNOSIS — D649 Anemia, unspecified: Secondary | ICD-10-CM | POA: Diagnosis not present

## 2013-01-01 DIAGNOSIS — E871 Hypo-osmolality and hyponatremia: Secondary | ICD-10-CM | POA: Diagnosis not present

## 2013-01-01 DIAGNOSIS — E039 Hypothyroidism, unspecified: Secondary | ICD-10-CM | POA: Diagnosis not present

## 2013-01-01 DIAGNOSIS — K5900 Constipation, unspecified: Secondary | ICD-10-CM | POA: Diagnosis not present

## 2013-01-01 DIAGNOSIS — R262 Difficulty in walking, not elsewhere classified: Secondary | ICD-10-CM | POA: Diagnosis not present

## 2013-01-01 DIAGNOSIS — E236 Other disorders of pituitary gland: Secondary | ICD-10-CM | POA: Diagnosis not present

## 2013-01-01 DIAGNOSIS — M6281 Muscle weakness (generalized): Secondary | ICD-10-CM | POA: Diagnosis not present

## 2013-01-01 LAB — CBC
HCT: 24.2 % — ABNORMAL LOW (ref 36.0–46.0)
Hemoglobin: 8.4 g/dL — ABNORMAL LOW (ref 12.0–15.0)
MCH: 30.7 pg (ref 26.0–34.0)
MCV: 88.3 fL (ref 78.0–100.0)
RBC: 2.74 MIL/uL — ABNORMAL LOW (ref 3.87–5.11)
WBC: 5.8 10*3/uL (ref 4.0–10.5)

## 2013-01-01 MED ORDER — FLEET ENEMA 7-19 GM/118ML RE ENEM: 1.0000 | ENEMA | RECTAL | Status: DC | PRN

## 2013-01-01 MED ORDER — INFLUENZA VAC SPLIT QUAD 0.5 ML IM SUSP: 0.5000 mL | INTRAMUSCULAR | Status: DC

## 2013-01-01 MED ORDER — DOCUSATE SODIUM 100 MG PO CAPS: 100.0000 mg | ORAL_CAPSULE | Freq: Two times a day (BID) | ORAL | Status: DC

## 2013-01-01 MED ORDER — ASPIRIN 325 MG PO TBEC: 325.0000 mg | DELAYED_RELEASE_TABLET | Freq: Every day | ORAL | Status: DC

## 2013-01-01 MED ORDER — ALPRAZOLAM 0.25 MG PO TABS: 0.2500 mg | ORAL_TABLET | Freq: Three times a day (TID) | ORAL | Status: DC | PRN

## 2013-01-01 MED ORDER — FERROUS SULFATE 325 (65 FE) MG PO TABS: 325.0000 mg | ORAL_TABLET | Freq: Three times a day (TID) | ORAL | Status: DC

## 2013-01-01 MED ORDER — HYDROCODONE-ACETAMINOPHEN 5-325 MG PO TABS
1.0000 | ORAL_TABLET | Freq: Four times a day (QID) | ORAL | Status: DC | PRN
Start: 2013-01-01 — End: 2013-01-02

## 2013-01-01 MED ORDER — ENOXAPARIN SODIUM 40 MG/0.4ML ~~LOC~~ SOLN: 40.0000 mg | SUBCUTANEOUS | Status: DC

## 2013-01-01 MED ORDER — ASPIRIN EC 81 MG PO TBEC: 81.0000 mg | DELAYED_RELEASE_TABLET | Freq: Every day | ORAL | Status: DC

## 2013-01-01 MED ORDER — METHOCARBAMOL 500 MG PO TABS: 500.0000 mg | ORAL_TABLET | Freq: Four times a day (QID) | ORAL | Status: DC | PRN

## 2013-01-01 NOTE — Progress Notes (Signed)
Physical Therapy Treatment Patient Details Name: [name] MRN: [id number] DOB: 10-17-1922 Today's Date: 01/01/2013 Time: 4098-1191 PT Time Calculation (min): 24 min  PT Assessment / Plan / Recommendation  History of Present Illness Pt admitted after fall resulting in left greater trochanter fracture.  She underwent IM nailing on 12/29/12 via Dr. Shon Baton.     PT Comments   Patient progressing slowly with ambulation this session. Patient is motivated but limited by overall weakness and fatique  Follow Up Recommendations  SNF     Does the patient have the potential to tolerate intense rehabilitation     Barriers to Discharge        Equipment Recommendations  Rolling walker with 5" wheels;3in1 (PT)    Recommendations for Other Services    Frequency Min 5X/week   Progress towards PT Goals Progress towards PT goals: Progressing toward goals  Plan Current plan remains appropriate    Precautions / Restrictions Precautions Precautions: Fall Restrictions LLE Weight Bearing: Weight bearing as tolerated   Pertinent Vitals/Pain 5/10 L hip pain. patient repositioned for comfort     Mobility  Bed Mobility Supine to Sit: 3: Mod assist;With rails Sitting - Scoot to Edge of Bed: 3: Mod assist Details for Bed Mobility Assistance: Step-by-step cues for technique; Mod assist mostly to acheive fully upright sitting and square hips at EOB; Pt was ableto initiate lifting trunk from bed Transfers Sit to Stand: From bed;4: Min assist Stand to Sit: 4: Min assist;To chair/3-in-1 Details for Transfer Assistance: A to ensure balance. Cues for technique Ambulation/Gait Ambulation/Gait Assistance: 4: Min assist Ambulation Distance (Feet): 15 Feet Assistive device: Rolling walker Ambulation/Gait Assistance Details: Patient cued for technique and sequency Gait Pattern: Step-to pattern Gait velocity: very decreased    Exercises Total Joint Exercises Long Arc Quad: AROM;10 reps;Seated   PT  Diagnosis:    PT Problem List:   PT Treatment Interventions:     PT Goals (current goals can now be found in the care plan section)    Visit Information  Last PT Received On: 01/01/13 Assistance Needed: +1 History of Present Illness: Pt admitted after fall resulting in left greater trochanter fracture.  She underwent IM nailing on 12/29/12 via Dr. Shon Baton.      Subjective Data      Cognition  Cognition Arousal/Alertness: Awake/alert Behavior During Therapy: WFL for tasks assessed/performed Overall Cognitive Status: Within Functional Limits for tasks assessed    Balance     End of Session PT - End of Session Activity Tolerance: Patient tolerated treatment well Patient left: in chair;with call bell/phone within reach Nurse Communication: Mobility status   GP     Fredrich Birks 01/01/2013, 10:31 AM 01/01/2013 Fredrich Birks PTA [phone] pager [phone] office

## 2013-01-01 NOTE — Progress Notes (Signed)
[facility] to give pt flu shot in am

## 2013-01-01 NOTE — Discharge Summary (Signed)
Physician Discharge Summary  Patient ID: [name] MRN: [id number] DOB/AGE: 07/07/1922 79 y.o.  Admit date: 12/27/2012 Discharge date: 01/01/2013  Primary Care Physician:  Catalina Pizza, MD  Discharge Diagnoses:   Closed left hip fracture s/p ORIF with trochanteric entry nail on 12/29/2012 . ANEMIA- post op . Hypertension . Hypothyroidism . Pacemaker . Hyponatremia  Consults: Orthopedics, Dr Shon Baton   Recommendations for Outpatient Follow-up:  Weight bearing as tolerated LLE Incentive spirometry Aspirin 325 mg daily for one month then resume aspirin 81 mg daily Lovenox 40 mg subcutaneous daily for DVT prophylaxis for 2 weeks Follow with Dr. Shon Baton, orthopedics in 2 weeks.  Allergies:   Allergies  Allergen Reactions  . Tramadol Itching and Nausea Only  . Sulfa Antibiotics Nausea And Vomiting     Discharge Medications:   Medication List         acetaminophen 325 MG tablet  Commonly known as:  TYLENOL  Take 650 mg by mouth every 6 (six) hours as needed. For pain     ALPRAZolam 0.25 MG tablet  Commonly known as:  XANAX  Take 1 tablet (0.25 mg total) by mouth every 8 (eight) hours as needed for sleep or anxiety. For anxiety     amLODipine 10 MG tablet  Commonly known as:  NORVASC  Take 0.5 tablets (5 mg total) by mouth daily.     aspirin 325 MG EC tablet  Take 1 tablet (325 mg total) by mouth daily with breakfast. X 1 month, then continue ASA 81mg  daily     aspirin EC 81 MG tablet  Take 1 tablet (81 mg total) by mouth daily. Restart after ASA 325mg  for 1 month is completed     BENEFIBER DRINK MIX PO  Take by mouth daily. Mix 2 teaspoonfuls with water water and drink daily.     beta carotene w/minerals tablet  Take 1 tablet by mouth 2 (two) times daily.     cycloSPORINE 0.05 % ophthalmic emulsion  Commonly known as:  RESTASIS  Place 1 drop into both eyes 2 (two) times daily.     docusate sodium 100 MG capsule  Commonly known as:  COLACE  Take 1 capsule  (100 mg total) by mouth 2 (two) times daily.     enoxaparin 40 MG/0.4ML injection  Commonly known as:  LOVENOX  Inject 0.4 mLs (40 mg total) into the skin daily. X 2 weeks     ferrous sulfate 325 (65 FE) MG tablet  Take 1 tablet (325 mg total) by mouth 3 (three) times daily after meals.     fexofenadine 180 MG tablet  Commonly known as:  ALLEGRA  Take 180 mg by mouth daily.     fluticasone 50 MCG/ACT nasal spray  Commonly known as:  FLONASE  Place 2 sprays into the nose daily as needed. For allergies     hydrALAZINE 25 MG tablet  Commonly known as:  APRESOLINE  Take 25 mg by mouth 2 (two) times daily.     HYDROcodone-acetaminophen 5-325 MG per tablet  Commonly known as:  NORCO/VICODIN  Take 1-2 tablets by mouth every 6 (six) hours as needed for pain.     levothyroxine 88 MCG tablet  Commonly known as:  SYNTHROID, LEVOTHROID  Take 88 mcg by mouth every morning.     LINZESS 145 MCG Caps capsule  Generic drug:  Linaclotide  Take 145 mcg by mouth 2 (two) times daily. As needed     lisinopril 20 MG tablet  Commonly known  as:  PRINIVIL,ZESTRIL  Take 20 mg by mouth 2 (two) times daily.     Magnesium 250 MG Tabs  Take 1 tablet by mouth every evening.     methocarbamol 500 MG tablet  Commonly known as:  ROBAXIN  Take 1 tablet (500 mg total) by mouth every 6 (six) hours as needed (muscle spasms).     omeprazole 20 MG capsule  Commonly known as:  PRILOSEC  Take 20 mg by mouth every morning.     polyethylene glycol powder powder  Commonly known as:  GLYCOLAX/MIRALAX  Take 17 g by mouth daily as needed. For constipation. *One capful to be mixed in water or juice*     sodium phosphate 7-19 GM/118ML Enem  Place 1 enema rectally every other day as needed (if patient has no BM for 2 days).     VOLTAREN 1 % Gel  Generic drug:  diclofenac sodium         Brief H and P: For complete details please refer to admission H and P, but in brief patient is 79 year old female with  history of hypertension, hypothyroidism, anemia, osteoporosis, hyperlipidemia, GERD, pacemaker presented to [facility] emergency room after a mechanical fall. Imaging revealed displaced left hip fracture, otherwise workup was essentially unremarkable. Patient requested transfer to [facility]. Patient reported that she had a mechanical fall when her foot was caught between the carpet and linoleum floor.  In the emergency department noted be afebrile with stable vital signs. Sodium 128, chloride 93. Normal BUN and creatinine. CBC unremarkable. Urinalysis negative. Chest x-ray unremarkable. EKG not acute.      Hospital Course:  Closed left hip fracture -patient was transferred to [facility]. Orthopedics, Dr Shon Baton was consulted. Patient underwent open reduction and internal fixation with trochanteric entry nail on 12/29/2012 . Patient has been working with physical therapy. She is recommended skilled nursing facility for rehabilitation. Postoperatively patient was noted to have hemoglobin of 6.1 likely due to acute blood loss anemia from surgery. Patient received 2 units packed RBC on 12/30/12 Doppler US LLE showed no DVT.  Chronic Constipation: Patient has a history of chronic constipation however due to the surgery this was exacerbated. She received an enema on 9/10 with resulting bowel movement. Please continue on scheduled stool softeners, laxatives and enema every other day as needed.   ANEMIA : Currently stable at 8.4. Patient received 2 units packed RBCs on 12/30/12   Hypertension: Stable   Hypothyroidism: Continue Synthroid    Day of Discharge BP 138/49  Pulse 84  Temp(Src) 98.8 F (37.1 C) (Oral)  Resp 16  Ht 5\' 5"  (1.651 m)  Wt 65.499 kg (144 lb 6.4 oz)  BMI 24.03 kg/m2  SpO2 92%  Physical Exam: General: Alert and awake oriented x3 not in any acute distress. CVS: S1-S2 clear no murmur rubs or gallops Chest: clear to auscultation bilaterally, no wheezing rales or  rhonchi Abdomen: soft nontender, nondistended, normal bowel sounds Extremities: no cyanosis, clubbing or edema noted bilaterally Neuro: Cranial nerves II-XII intact, no focal neurological deficits   The results of significant diagnostics from this hospitalization (including imaging, microbiology, ancillary and laboratory) are listed below for reference.    LAB RESULTS: Basic Metabolic Panel:  Recent Labs Lab 12/30/12 0430 12/31/12 0445  NA 129* 130*  K 4.6 4.2  CL 97 96  CO2 26 27  GLUCOSE 125* 121*  BUN 13 15  CREATININE 0.94 0.96  CALCIUM 8.1* 8.1*   Liver Function Tests: No results  found for this basename: AST, ALT, ALKPHOS, BILITOT, PROT, ALBUMIN,  in the last 168 hours No results found for this basename: LIPASE, AMYLASE,  in the last 168 hours No results found for this basename: AMMONIA,  in the last 168 hours CBC:  Recent Labs Lab 12/27/12 1349  12/31/12 0445 12/31/12 1747 01/01/13 0620  WBC 6.4  < > 6.5  --  5.8  NEUTROABS 3.0  --   --   --   --   HGB 11.5*  < > 8.9* 9.0* 8.4*  HCT 33.2*  < > 25.3* 25.8* 24.2*  MCV 96.5  < > 87.2  --  88.3  PLT 324  < > 171  --  188  < > = values in this interval not displayed. Cardiac Enzymes: No results found for this basename: CKTOTAL, CKMB, CKMBINDEX, TROPONINI,  in the last 168 hours BNP: No components found with this basename: POCBNP,  CBG: No results found for this basename: GLUCAP,  in the last 168 hours  Significant Diagnostic Studies:  Dg Chest 1 View  12/27/2012   *RADIOLOGY REPORT*  Clinical Data: Possible hip fracture.  CHEST - 1 VIEW  Comparison: 08/02/2011  Findings: Left-sided pacemaker is unchanged.  Lungs are adequately inflated without consolidation or effusion.  There is mild stable cardiomegaly.  Remainder of the exam is unchanged.  IMPRESSION: No acute cardiopulmonary disease.  Mild stable cardiomegaly.   Original Report Authenticated By: Elberta Fortis, M.D.   Dg Hip Complete Left  12/27/2012    *RADIOLOGY REPORT*  Clinical Data: Possible left hip fracture post fall.  LEFT HIP - COMPLETE 2+ VIEW  Comparison: 12/11/2006  Findings: There is diffuse decreased bone density.  There is a displaced intertrochanteric fracture of the left hip with resulting coxa varum deformity.  Remainder of the exam is unchanged.  IMPRESSION: Displaced intertrochanteric fracture of the left hip.   Original Report Authenticated By: Elberta Fortis, M.D.    2D ECHO:   Disposition and Follow-up:     Discharge Orders   Future Appointments Provider Department Dept Phone   03/16/2013 9:40 AM [facility] Main Office [place]) [phone]   Future Orders Complete By Expires   Diet - low sodium heart healthy  As directed    Increase activity slowly  As directed        DISPOSITION:Skilled nursing facility   DIET:Heart healthy diet   ACTIVITY: As tolerated   TESTS THAT NEED FOLLOW-UP CBC next week   DISCHARGE FOLLOW-UP Follow-up Information   Follow up with Catalina Pizza, MD. Schedule an appointment as soon as possible for a visit in 2 weeks.   Specialty:  Internal Medicine   Contact information:    [address]  [place] [address] [phone]       Follow up with Alvy Beal, MD. Schedule an appointment as soon as possible for a visit in 2 weeks. (for follow-up)    Specialty:  Orthopedic Surgery   Contact information:   [address] [place] [address] [phone]       Time spent on Discharge: 40 minutes   Signed:   RAI,RIPUDEEP M.D. [facility] 01/01/2013, 12:06 PM Pager: [phone]

## 2013-01-01 NOTE — Progress Notes (Signed)
Clinical social worker assisted with patient discharge to skilled nursing facility, [facility].  CSW addressed all family questions and concerns. CSW copied chart and added all important documents. CSW also set up patient transportation with [facility]. Clinical Social Worker will sign off for now as social work intervention is no longer needed.   Tulio Facundo, MSW,  [phone] 

## 2013-01-02 ENCOUNTER — Non-Acute Institutional Stay (SKILLED_NURSING_FACILITY): Payer: Medicare Other | Admitting: Internal Medicine

## 2013-01-02 ENCOUNTER — Other Ambulatory Visit: Payer: Self-pay | Admitting: *Deleted

## 2013-01-02 DIAGNOSIS — K219 Gastro-esophageal reflux disease without esophagitis: Secondary | ICD-10-CM

## 2013-01-02 DIAGNOSIS — D649 Anemia, unspecified: Secondary | ICD-10-CM

## 2013-01-02 DIAGNOSIS — S72002P Fracture of unspecified part of neck of left femur, subsequent encounter for closed fracture with malunion: Secondary | ICD-10-CM

## 2013-01-02 DIAGNOSIS — E871 Hypo-osmolality and hyponatremia: Secondary | ICD-10-CM

## 2013-01-02 DIAGNOSIS — IMO0002 Reserved for concepts with insufficient information to code with codable children: Secondary | ICD-10-CM | POA: Diagnosis not present

## 2013-01-02 DIAGNOSIS — E039 Hypothyroidism, unspecified: Secondary | ICD-10-CM

## 2013-01-02 DIAGNOSIS — K5900 Constipation, unspecified: Secondary | ICD-10-CM | POA: Diagnosis not present

## 2013-01-02 DIAGNOSIS — J309 Allergic rhinitis, unspecified: Secondary | ICD-10-CM

## 2013-01-02 MED ORDER — HYDROCODONE-ACETAMINOPHEN 5-325 MG PO TABS: ORAL_TABLET | ORAL | Status: DC

## 2013-01-02 NOTE — Progress Notes (Signed)
Patient ID: [name], female   DOB: 16-Jun-1922, 79 y.o.   MRN: [id number] This is an acute visit.  Level of care skilled.  Facility [facility].  Chief complaint-acute visit status post hospitalization for left hip fracture complicated with anemia.  History of present illness.  Patient is a pleasant 78 year old female who is here for rehabilitation after sustaining a left hip fracture-she is status post open reduction internal fixation with trochanteric entry nail...  She sustained the fracture after an accidental fall.  Her postop course was complicated with a hemoglobin of 6.1 thought to be postop anemia-she did require transfusion of 2 units packed red blood cells on September 9 in her hemoglobin at discharge was 8.4.  However I did lab today shows her hemoglobin has dropped to 7.3 this is a normocytic anemia.--3  She denies any previous history of peptic ulcer disease or GI bleed-although she states when she had bowel movements in the hospital- apparently required some straining and there was a small amount of blood noted  She also has a history of chronic constipation this was apparently aggravated by her surgery and inactivity she did receive an enema in the hospital she does continue on Benefiber Colace as well as MiraLax and when necessary enema  Previous medical history.  Post left hip fracture status post open reduction internal fixation with nailing.  Anemia thought to be postop blood loss.  Hypertension.  Hypothyroidism.  Pacemaker placement.  Hyponatremia.  Hyperlipidemia.  Chronic constipation.  Allergic rhinitis.  Diverticulitis-September 2013  Previous surgical history.  Previous history of thyroidectomy tonsillectomy and vertebroplasty.  [facility] studies include a venous Doppler of the left leg which was negative for DVT  Indications.  Tylenol 650 mg every 6 hours when necessary.  Xanax 0.25 mg every 8 hours when necessary.  Norvasc 5 mg  daily.  Aspirin 325 mg daily for one month then switch to aspirin 81 mg daily.  Rest ASIS one drop in both eyes twice a day.  Colace 100 mg twice a day.  Lovenox 40 mg daily x2 weeks.  Ferrous sulfate 325 mg 3 times a day.  Allegra 180 mg daily.  Flonase 2 sprays in nose daily when necessary.  Hydralazine 25 mg twice a day.  Vicodin 5-325 mg every 6 hours when necessary.  Synthroid 88 mcg a day.  Lyinzess  145 mcg twice a day.  Lisinopril 20 mg twice a day.  Magnesium 250 mg each bedtime.  Robaxin 500 mg every 6 hours when necessary.  Prilosec 20 mg every morning.  MiraLax daily when necessary    .    Family history.  Father with a history of cancer.  Mother with a history of coronary artery disease and did have an MI  Of note she also has a brother who had cancer.  Social history.  She has lived in an assisted living facility the past 2 years-states she really likes it there-she does have a son who is very supportive  No history of tobacco alcohol or illicit drug use.  .  .  Review of systems.  In general denies any fever or chills.  Skin-denies bruising or itching.  Head ears eyes nose mouth and throat-he is hard of hearing has a hearing aid -does not complaining of any visual changes does occasionally have nasal congestion with a history of allergic rhinitis.  Respiratory-denies shortness of breath or cough.  Cardiac-does not complaining of chest pain or palpitations.  GI-has a history of chronic constipation  occasionally will require an enema she states denies any abdominal pain nausea or vomiting or diarrhea.  Muscle skeletal-does have at times some left hip pain this is currently controlled with her medications otherwise denies discomfort.  Neurologic-no complaints of headache says occasionally when she sits up quickly she'll have a short episode of dizziness.  Psych-does not complain of sadness depressive symptoms or  anxiety.      .  Physical exam  Temperature is 97.1 pulse 80 respirations 20 blood pressure 126/62.  In general this is a somewhat frail elderly female in no distress lying comfortably in bed.  Her skin is warm and dry.  Eyes pupils appear equal round reactive to light extraocular movements intact is --visual acuity. Grossly intact sclera and conjunctiva are clear.  . Oropharynx-clear mucous membranes moist.  Chest-clear to auscultation without rhonchi rales or wheezes no labored breathing.  Heart is regular rate and rhythm without murmur gallop or rub.--She has some mild edema a left thigh area again previous Doppler was negative for DVT-she has somewhat reduced pedal pulses bilaterally  Abdomen is somewhat obese soft nontender with active bowel sounds.  GU-is grossly within normal limits no drainage bleeding or discharge noted or rash.  Rectal-I did not note any bleeding hemorrhoids-digital exam was performed--occult blood test was negative  Muscle skeletal moves upper and. Extremities with appropriate range of motion and strength as well as right lower leg.  Surgical changes to left hip there are 2 small surgical sites that appear to be fairly benign-looking with no drainage or bleeding or surrounding erythema--Staples are in place There is some mild postop edema of the left thigh--.  Neurologic-grossly intact no lateralizing findings her speech is clear  Psych-she is alert and oriented x3 pleasant and appropriate  .  Labs.  01/02/2013.  WBC 5.5 hemoglobin 7.3 platelets 236.  Sodium 129 potassium 4.5 BUN 19 creatinine 0.94.  01/01/2013.  WBC 5.8 hemoglobin 8.4 platelets 188.  12/31/2012.  Sodium 1:30 potassium 4.2 he went 15 creatinine 0.96.  01/15/2012.  Liver function tests were within normal limits.  .  Assessment and plan.  #1-anemia--patient's hemoglobin has dropped.  She does not appear to be overtly symptomatic-do notet she is on aspirin  325 as well as Lovenox I discussed this with Dr. Leanord Hawking via phone and we will discontinue the aspirin-she is on a proton pump inhibitor as well as iron certainly will continue these.  We'll guaiac stools x3.--On digital exam today guaiac was negative  And also obtain a stat CBC on Monday, September 15-certainly monitor with vital signs every shift with pulse ox as well to monitor for any changes--including shortness of breath chest pain palpitations or increased dizziness   #2-left hip fracture with repair-will need continued PT and OT she is on Lovenox as well as aspirin for anticoagulation-she is receiving Vicodin as well as muscle relaxer when necessary.  [facility]  #3-constipation-apparently this is chronic she is on numerous agents as stated above will continue to monitor does not complaining of constipation today but I suspect this could be an issue especially with her narcotics.  #4-history of hyponatremia-this appears to be somewhat chronic per chart review and labs done in hospital we'll recheck this first lab date next week.  #5-hypothyroidism-she is on Synthroid Will update TSH as well next week.  #6 hypertension-at this point appears to be stable she is on numerous agents including Norvasc hydralazine and lisinopril.  #7-history of allergic rhinitis she continues on Allegra as well as Flonase when  necessary at this point stable. This is chronic situation as well.  #8-GERD she continues on a proton pump inhibitor.  [id number] note greater than 45 minutes spent assessing patient-reviewing hospital records-and coordinating and formulating a plan of care for numerous diagnoses--of note greater than 50% of time spent coordinating and formulating a plan of care

## 2013-01-05 ENCOUNTER — Non-Acute Institutional Stay (SKILLED_NURSING_FACILITY): Payer: Medicare Other | Admitting: Internal Medicine

## 2013-01-05 DIAGNOSIS — E871 Hypo-osmolality and hyponatremia: Secondary | ICD-10-CM | POA: Diagnosis not present

## 2013-01-05 DIAGNOSIS — S72002P Fracture of unspecified part of neck of left femur, subsequent encounter for closed fracture with malunion: Secondary | ICD-10-CM

## 2013-01-05 DIAGNOSIS — IMO0002 Reserved for concepts with insufficient information to code with codable children: Secondary | ICD-10-CM | POA: Diagnosis not present

## 2013-01-05 DIAGNOSIS — D649 Anemia, unspecified: Secondary | ICD-10-CM | POA: Diagnosis not present

## 2013-01-06 ENCOUNTER — Non-Acute Institutional Stay (SKILLED_NURSING_FACILITY): Payer: Medicare Other | Admitting: Internal Medicine

## 2013-01-06 DIAGNOSIS — S72009D Fracture of unspecified part of neck of unspecified femur, subsequent encounter for closed fracture with routine healing: Secondary | ICD-10-CM

## 2013-01-06 DIAGNOSIS — S72002D Fracture of unspecified part of neck of left femur, subsequent encounter for closed fracture with routine healing: Secondary | ICD-10-CM

## 2013-01-06 DIAGNOSIS — E039 Hypothyroidism, unspecified: Secondary | ICD-10-CM

## 2013-01-06 DIAGNOSIS — I10 Essential (primary) hypertension: Secondary | ICD-10-CM | POA: Diagnosis not present

## 2013-01-06 DIAGNOSIS — E871 Hypo-osmolality and hyponatremia: Secondary | ICD-10-CM

## 2013-01-06 DIAGNOSIS — R55 Syncope and collapse: Secondary | ICD-10-CM | POA: Diagnosis not present

## 2013-01-06 DIAGNOSIS — D649 Anemia, unspecified: Secondary | ICD-10-CM | POA: Diagnosis not present

## 2013-01-06 NOTE — Progress Notes (Signed)
Patient ID: [name], female   DOB: [birth year], 79 y.o.   MRN: [id number]        7 This is an acute visit.   Level of care skilled.   Facility [facility].   Chief complaint-acute visit --followup question syncopal episode this morning.   History of present illness.   Patient is a pleasant 79 year old female who is here for rehabilitation after sustaining a left hip fracture-she is status post open reduction internal fixation with trochanteric entry nail...   She sustained the fracture after an accidental fall.   Her postop course was complicated with a hemoglobin of 6.1 thought to be postop anemia-she did require transfusion of 2 units packed red blood cells on September 9 in her hemoglobin at discharge was 8.4.  Labs on the facility initially showed a drop in hemoglobin to 7.3 this was rechecked yesterday and hemoglobin had improved somewhat to 7.6.  We did discontinue her aspirin she continues on Lovenox for anticoagulation.  She is also on iron as well as a proton pump inhibitor  Nursing staff says that earlier today patient was in the bathroom and apparently felt like she was going to black out or faint although she never apparently did-I am following up on this  Apparently a nursing assistant was with her assisting her in washing herself while she was in the bathroom sitting in a wheelchair and patient stated that she felt like she was blacking out although she never did lose consciousness apparently she looked pale she was placed back in bed and apparently improved.  Her vital signs were stable throughout this--she denied any shortness of breath or chest pain or dizziness with this said it just felt that she was blacking out.--But was aware  of everything happening at the time  I did review her records in [place]  and do not note  extensive cardiac history I did see in 2008 she did have a Myoview done which showed normal left ventricular size and function no convincing evidence  of  Ischemia   Of note she does have a history of hypothyroidism did order a TSH which came back elevated at over 8-she is on Synthroid 88 mcg a day      Previous medical history.   Post left hip fracture status post open reduction internal fixation with nailing.   Anemia thought to be postop blood loss.   Hypertension.   Hypothyroidism.   Pacemaker placement.   Hyponatremia.   Hyperlipidemia.   Chronic constipation.   Allergic rhinitis.   Diverticulitis-September 2013   Previous surgical history.   Previous history of thyroidectomy tonsillectomy and vertebroplasty.   [facility] include a venous Doppler of the left leg which was negative for DVT   .   Marland Kitchen  .         .       Family Hydralazine 25 mg twice a day.   Vicodin 5-325 mg every 6 hours when necessary  MiraLax daily when  history.   Father with a history of cancer.   Mother with a history of coronary artery disease and did have an MI   Of note she also has a brother who had cancer.   Social history.   She has lived in an assisted living facility the past 2 years-states she really likes it there-she does have a son who is very supportive   No history of tobacco alcohol or illicit drug use.   Marland Kitchen   Marland Kitchen  Review of systems Gen. no complaints of fever or chills says she just feels weak ever since surgery  Eyes-denies any visual changes.  Marland Kitchen  Respiratory does not complain of shortness of breath or cough.  Cardiac no complaints of chest pain.  Muscle skeletal does have history of left hip fracture with repair apparently pain in his well controlled at this point.  Neurologic does not complaining of any dizziness or headache again did note that she felt like she was blacking out earlier today.  Psych do not note any significant issues today continues to be pleasant.      Physical exam  She is afebrile pulse of 86 respirations 20 blood  pressure 140/60 I did take it manually  as well as sitting on the side of the bed and got similar results  o.   In general this is a somewhat frail elderly female in no distress lying comfortably in bed.   Her skin is warm and dry.   Eyes pupils appear equal round reactive to light extraocular movements intact is --visual acuity. Grossly intact sclera and conjunctiva are clear.   . Oropharynx-clear mucous membranes moist.   Chest-clear to auscultation without rhonchi rales or wheezes no labored breathing.   Heart is regular rate and rhythm without murmur gallop or rub.--She has some mild edema a left thigh area again previous Doppler was negative for DVT--   Abdomen is somewhat obese soft nontender with active bowel sounds.   GU-is grossly within normal limits no drainage bleeding or discharge noted or rash.       Muscle skeletal moves upper and. Extremities with appropriate range of motion and strength as well as right lower leg.      Neurologic-grossly intact no lateralizing findings her speech is clear   Psych-she is alert and oriented x3 pleasant and appropriate   .   Labs  01/05/2013.  WBC 13.0 hemoglobin 7.6 platelets 335.  Sodium 1:30 potassium 4.8 BUN 17 creatinine 0.85 liver function tests within normal limits except albumin of 2.4 of note calcium 8.2.  TSH-8.288.  Marland Kitchen   01/02/2013.   WBC 5.5 hemoglobin 7.3 platelets 236.   Sodium 129 potassium 4.5 BUN 19 creatinine 0.94.   01/01/2013.   WBC 5.8 hemoglobin 8.4 platelets 188.   12/31/2012.   Sodium 1:30 potassium 4.2 he went 15 creatinine 0.96.   01/15/2012.   Liver function tests were within normal limits.   .   Assessment and plan.  #1-syncopal episode?-She appears to be at her baseline apparently this was quite transitory while she was sitting in the wheelchair-her vital signs remained stable-I did discuss this with Dr. Leanord Hawking via phone he will be here tomorrow to  followup but at this point will continue to monitor with vital signs and neuro checks every shift --she has been back at her baseline ever since this incident--physical exam is quite benign but we'll have to monitor this.      #2-anemia -- hemoglobin has improved slightly since late last week-followup CBC has been ordered for tomorrow by Dr. Drue Novel is on iron as well as a proton pump inhibitor     #3-left hip fracture with repair-will need continued PT and OT she is on Lovenox --aspirin discontinued secondary to anemia.   Marland Kitchen      #4-history of hyponatremia-this appears to be somewhat chronic per chart review and labs done in hospital -- appears to remain at baseline.   #5-hypothyroidism-she is on Synthroid --TSH is elevated will increase  to 100   mcg a day and recheck in 4 weeks   #6 hypertension-at this point appears to be stable she is on numerous agents including Norvasc hydralazine and lisinopril.--Blood pressures taken manually today 140/60-previous  ones have been 118/72 130/58 142/68 I do not see consistent lows or elevation  [id number]--of note 30 minutes spent assessing patient --- reviewing her records-assessing her status with nursing staff-and formulating plan of care for numerous diagnoses   .   e

## 2013-01-12 ENCOUNTER — Encounter (HOSPITAL_COMMUNITY): Payer: Medicare Other | Attending: Orthopedic Surgery

## 2013-01-12 DIAGNOSIS — D500 Iron deficiency anemia secondary to blood loss (chronic): Secondary | ICD-10-CM | POA: Insufficient documentation

## 2013-01-12 LAB — HEMOGLOBIN AND HEMATOCRIT, BLOOD
HCT: 27.5 % — ABNORMAL LOW (ref 36.0–46.0)
Hemoglobin: 9.1 g/dL — ABNORMAL LOW (ref 12.0–15.0)

## 2013-01-12 LAB — ABO/RH: ABO/RH(D): O POS

## 2013-01-12 LAB — PREPARE RBC (CROSSMATCH)

## 2013-01-12 NOTE — Progress Notes (Addendum)
Patient ID: [name], female   DOB: 02/24/1923, 79 y.o.   MRN: [id number]           HISTORY & PHYSICAL  DATE:  01/05/2013  FACILITY: [facility]    LEVEL OF CARE:   SNF   CHIEF COMPLAINT:  Status post admission to [facility], 12/27/2012 through 01/01/2013.    HISTORY OF PRESENT ILLNESS:  This is a patient who lived in [place] assisted living.  She is 79 years old.  She came to the emergency room after a mechanical fall.   She had a displaced left hip fracture.  She underwent an ORIF of the left hip.    As worrisome than this is the fact that she has had three falls in the last 2-3 weeks.  These were non-syncopal, usually when she was trying to remove something.  On the fall that occurred with the hip fracture, she was coming back from church and walking back into the building.  She does not use ambulatory assist devices.    The perioperative issue seemed to be anemia.  She received 2 U of packed cells on 12/30/2012.  Her hemoglobin went from 6.1 up to 10.3.  However, by discharge on 01/01/2013, this was 8.4; and as of 01/02/2013, this was 7.3.    PAST MEDICAL HISTORY/PROBLEM LIST:  Closed left hip fracture, status post ORIF on 12/29/2012.    Multiple recent falls of unclear etiology.    Postop anemia.  She was transfused.  However, her hemoglobin is again falling.    Hypertension.    Hypothyroidism.    History of a pacemaker implant.    Hyponatremia with a sodium between 129 and 130.    CURRENT MEDICATIONS:  Medication list is reviewed.     Xanax 0.25 every 8 hours p.r.n.    Norvasc 5 q.d.    ASA 325 q.d. for a month, then 81 q.d.    Restasis 1 drop into both eyes two times daily.    Enoxaparin 40 mg daily for two weeks.    Ferrous sulfate 325 three times daily.    Allegra 180 mg daily for allergic rhinitis.    Flonase 2 sprays as needed for allergies.    Hydralazine 25 two times a day.    Norco/Vicodin 1-2 tablets by mouth every 6 hours.     Synthroid 88 mcg q.d.    Linzess 145 b.i.d. p.r.n.    Lisinopril 20 mg b.i.d.    Magnesium 250, 1 tablet every morning.    Robaxin 500 q.6 p.r.n.    Prilosec 20 q.d.    MiraLAX 17 g daily.    Enemas, Fleet Phospho-Soda, 1 enema every other day as needed.    Voltaren 1% gel p.r.n.    SOCIAL HISTORY: HOUSING:  The patient lives at [place] assisted living for the last two years.   FUNCTIONAL STATUS:  She is not using an ambulatory assist device.  Fall history noted.    REVIEW OF SYSTEMS:   CHEST/RESPIRATORY:  No cough.  No sputum.   CARDIAC:   No chest pain.   GI:  Has severe recurrent constipation.   GU:  Describes urinary frequency, up every hour or two at night.  She has been tried on medications for this, but states this makes her constipation worse.   MUSCULOSKELETAL:  Has been on medications for osteoporosis in the past, but these were stopped.  I have no other information.    PHYSICAL EXAMINATION:  VITAL SIGNS:   PULSE:  68.   RESPIRATIONS:  18.   GENERAL APPEARANCE:  Very functional, coherent, 79 year-old lady in no distress.   HEENT:   EYES:  Macular degeneration, per description of the family and the patient.    CHEST/RESPIRATORY:  Clear air entry bilaterally.   CARDIOVASCULAR:  CARDIAC:   Heart sounds are normal.  There are no murmurs.  No bruits.   GASTROINTESTINAL:  ABDOMEN:   Somewhat distended.  Bowel sounds are positive.   LIVER/SPLEEN/KIDNEYS:  No liver, no spleen.  No tenderness.   GENITOURINARY:  BLADDER:   Bladder is not overtly distended.  There is no costovertebral angle tenderness.   MUSCULOSKELETAL:   EXTREMITIES:   BILATERAL LOWER EXTREMITIES:  I could not get at the hip.  There is a lot of swelling in the left thigh, but no signs of a distal DVT.  I suspect this is all blood loss into the thigh itself.  Unfortunately, she has already been dressed.  Her knees are unstable, at least on the right side.  I suspect there is significant  osteoarthritis.     ASSESSMENT/PLAN:  Closed left hip fracture.  Status post ORIF.    Probable osteoporosis.  The reason that she is not on medication treatment for this is unclear.  Further, her son states that she was on this in the past and it was stopped.     Marked urinary urgency.  She has not tolerated any cholinergic medications due to constipation.  I will order a bladder ultrasound.    Severe idiopathic constipation.  On treatment for this.  I think she should take the MiraLAX regularly and I will order this.      Postoperative anemia.  Her hemoglobin was 7.3.  A CBC is supposed to be coming back.  No doubt, this will come back later this morning.  The aspirin has appropriately been put on hold.  I suspect all of this is probably blood loss into the thigh surrounding surgery.   Hyponatremia.  The cause of this is not completely clear at the bedside.  She appears to be euvolemic.  A follow-up sodium is coming back.

## 2013-01-13 ENCOUNTER — Encounter (HOSPITAL_COMMUNITY): Payer: Medicare Other

## 2013-01-13 NOTE — Progress Notes (Signed)
Resting quietly. Refuses lunch tray. Voices no c/o at this time.. Blood infusing without difficulty.

## 2013-01-13 NOTE — Progress Notes (Signed)
Second unit PRBC;s completed. Line flushed with saline. Tolerated well. Returned to [facility] via w/c in good condition.

## 2013-01-13 NOTE — Progress Notes (Signed)
Here for blood transfusion. Placed on stretcher. Connected to monitors. Warm blankets applied. Voices no c/o at this time.

## 2013-01-14 LAB — TYPE AND SCREEN
ABO/RH(D): O POS
Antibody Screen: NEGATIVE
Unit division: 0

## 2013-01-19 ENCOUNTER — Non-Acute Institutional Stay (SKILLED_NURSING_FACILITY): Payer: Medicare Other | Admitting: Internal Medicine

## 2013-01-19 DIAGNOSIS — E871 Hypo-osmolality and hyponatremia: Secondary | ICD-10-CM | POA: Diagnosis not present

## 2013-01-19 LAB — GLUCOSE, CAPILLARY: Glucose-Capillary: 94 mg/dL (ref 70–99)

## 2013-01-19 NOTE — Progress Notes (Signed)
Patient ID: [name], female   DOB: 1923-01-09, 79 y.o.   MRN: [id number] Facility; [facility] Chief complaint hyponatremia, predischarge review.  Mrs. Illescas is a lady who came to [place] in mid-September. She is from [facility]. She suffered a left hip fracture and underwent an ORIF of the left hip. She has had several falls prior to this these were non-syncopal. She is making a decent recovery here. I I noted a history of hyponatremia with her with a sodium between 129 and 1:30 have not really looked into this other than to exclude common medication causes. We discharge her lab work was done that showed a white count of 4 hemoglobin of 10.9 her sodium is 125 a BUN of 24. She was also treated for a recent UTI. Her last sodium was 131 on September 17. He tells me she feels a little tired and weak otherwise has no specific complaints.  Past Medical History  Diagnosis Date  . Hypertension   . Hypothyroidism   . Bradycardia   . Anemia   . Diverticulosis   . External hemorrhoids   . Constipation   . Macular degeneration   . Seasonal allergies   . Osteoporosis   . Degenerative joint disease   . Palpitations   . Hyperlipidemia   . GERD (gastroesophageal reflux disease)   . Pacemaker    Medication; Benadryl 25 mg by mouth every 6 hours when necessary itching, Cipro 1 tablet twice a day for 7 days, Colace 100 twice a day, Lovenox 40 mg subcutaneously, ferrous sulfate 325 orally 3 times a day, fexofenadine 180 mg, hydralazine 25 twice a day, hydrocodone/acetaminophen one to 2 tablets by mouth every 4 hours, lisinopril 20 mg daily, magnesium 250 one tablet once a day, methocarbamol 500 every 6 hours when necessary, MiraLax 17 g once a day, Norvasc 5 mg once a day, omeprazole 20 mg once a day, Synthroid 100 mg daily, full tear in 1% topically twice a day when necessary.  Review of systems Gen. patient states that she is somewhat weak Respiratory no cough no shortness of breath Cardiac no chest  pain GU urinary frequency at night  Physical examination Gen. somewhat fatigued-looking woman but alert and conversational. HEENT moist mucous membranes Respiratory clear entry bilaterally Cardiac heart sounds are normal no murmurs. She appears to be euvolemic Abdomen no liver no spleen no tenderness. GU no suprapubic or costovertebral angle tenderness.  Impression/plan #1 hyponatremia which appears to be euvolemic. Other medications that she is on the only culprit might be her ACE inhibitor. I'm going to do the SIADH workup to include a serum osmolality urine sodium and urine osmolality. I am going to put the lisinopril on hold monitor her blood pressure and repeat her basic metabolic panel on Wednesday. The patient is aware enough to know that she has been told this before. Therefore I suspect this is not an acute issue. Nevertheless I am concerned about sending patient home who falls with a sodium of 125

## 2013-01-21 ENCOUNTER — Non-Acute Institutional Stay (SKILLED_NURSING_FACILITY): Payer: Medicare Other | Admitting: Internal Medicine

## 2013-01-21 DIAGNOSIS — E236 Other disorders of pituitary gland: Secondary | ICD-10-CM | POA: Diagnosis not present

## 2013-01-21 DIAGNOSIS — E222 Syndrome of inappropriate secretion of antidiuretic hormone: Secondary | ICD-10-CM

## 2013-01-21 NOTE — Progress Notes (Signed)
Patient ID: [name], female   DOB: 1922/05/09, 79 y.o.   MRN: [id number] Facility; [facility] Chief complaint hyponatremia.  [name] is a lady who came to [place] in mid-September. She is from [facility]. She suffered a left hip fracture and underwent an ORIF of the left hip. She has had several falls prior to this these were non-syncopal. She is making a decent recovery here. I I noted a history of hyponatremia with her with a sodium between 129 and 130 have not really looked into this other than to exclude common medication causes. Her discharge her lab work was done that showed a white count of 4 hemoglobin of 10.9 her sodium is 125 a BUN of 24. She was also treated for a recent UTI. Her last sodium was 131 on September 17. A repeat sodium today is 123. I put her lisinopril on hold as of Monday although she had received a dose on that day. He tells me she feels a little tired and weak otherwise has no specific complaints. She has been drinking fluid while she was on antibiotics however I don't really have a great sense of whether this was "excessive"  I reviewed her [place] records and while there is a suggestion that her low sodium has been chronic I don't see this as specifically diagnose the. Her serum osmolality is 269 urine osmolality at 358. Her urine sodium is 33.   Past Medical History  Diagnosis Date  . Hypertension   . Hypothyroidism   . Bradycardia   . Anemia   . Diverticulosis   . External hemorrhoids   . Constipation   . Macular degeneration   . Seasonal allergies   . Osteoporosis   . Degenerative joint disease   . Palpitations   . Hyperlipidemia   . GERD (gastroesophageal reflux disease)   . Pacemaker    Technique:  Multidetector CT imaging of the abdomen and pelvis was performed following the standard protocol during bolus administration of intravenous contrast.   Contrast: 80mL OMNIPAQUE IOHEXOL 300 MG/ML  SOLN   Comparison: CT abdomen 01/18/2011    Findings: Lung bases are clear.  A small pericardial effusion is present.   Non-IV contrast images demonstrate no focal hepatic lesion.  The gallbladder, pancreas, spleen, adrenal glands, and kidneys are normal.   Small hiatal hernia is present.  The stomach contains the majority of the ingested oral contrast.  Trace contrast flows into the second portion the duodenum.  No obstructive lesions identified.   The small bowel, cecum, appendix are normal.  There multiple diverticula throughout the colon and sigmoid colon.  High densities diverticula within the sigmoid colon.   Within the sigmoid colon, there is one focus of pericolonic mild fat stranding and inflammation (image 59, series 2, image 41 of series 4). Inflammation is adjacent to a diverticulum of the sigmoid colon just superior to the uterus.   Abdominal aorta normal caliber.  No retroperitoneal or periportal lymphadenopathy.  The   There are calcified fibroids in the uterus.  The bladder is normal. No pelvic lymphadenopathy.  No free fluid the pelvis.   IMPRESSION:   1.  Mild acute diverticulitis of the sigmoid colon with no complicating features.   2.  Extensive diverticulosis of the sigmoid colon. 3.  Poor egress of oral contrast from the stomach to the duodenum. No obstructing lesion is identified.  This is nonspecific finding but could indicate gastroparesis.  The patient was not experiencing vomiting to suggest a gastric  outlet obstruction.   CHEST - 1 VIEW   Comparison: 08/02/2011   Findings: Left-sided pacemaker is unchanged.  Lungs are adequately inflated without consolidation or effusion.  There is mild stable cardiomegaly.  Remainder of the exam is unchanged.   IMPRESSION: No acute cardiopulmonary disease.   Mild stable cardiomegaly.     Original Report Authenticated By: Reuel Boom     Medication; Benadryl 25 mg by mouth every 6 hours when necessary itching, Cipro 1 tablet twice a day for 7  days, Colace 100 twice a day, Lovenox 40 mg subcutaneously, ferrous sulfate 325 orally 3 times a day, fexofenadine 180 mg, hydralazine 25 twice a day, hydrocodone/acetaminophen one to 2 tablets by mouth every 4 hours, lisinopril 20 mg daily, magnesium 250 one tablet once a day, methocarbamol 500 every 6 hours when necessary, MiraLax 17 g once a day, Norvasc 5 mg once a day, omeprazole 20 mg once a day, Synthroid 100 mg daily, full tear in 1% topically twice a day when necessary.  Review of systems Gen. patient states that she is somewhat weak Respiratory no cough no shortness of breath Cardiac no chest pain GU urinary frequency at night  Physical examination temperature 97.8 pulse 81 respirations 20 blood pressure 158/72 O2 sat is 99% on room Gen. She does not look unwell. HEENT moist mucous membranes Respiratory clear entry bilaterally Cardiac heart sounds are normal no murmurs. She appears to be euvolemic Abdomen no liver no spleen no tenderness. GU no suprapubic or costovertebral angle tenderness.  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 I have discontinued the ACE inhibitor for now. I am going to fluid restrict her.  Ultimately a CT scan of the head and CT scan of the chest may be necessary however if this has been present chronically for years I think this would make the reset osmostat theory much more likely. Therefore we may be able to forego the academic work up. Whether or not in assisted living can provide a fluid restriction or not I am uncertain. All of this has been extensively discussed with the patient and her son who is present. I also spoke with her primary physician Dr. Margo Aye

## 2013-01-26 ENCOUNTER — Non-Acute Institutional Stay (SKILLED_NURSING_FACILITY): Payer: Medicare Other | Admitting: Internal Medicine

## 2013-01-26 DIAGNOSIS — D649 Anemia, unspecified: Secondary | ICD-10-CM | POA: Diagnosis not present

## 2013-01-26 DIAGNOSIS — E871 Hypo-osmolality and hyponatremia: Secondary | ICD-10-CM | POA: Diagnosis not present

## 2013-01-26 DIAGNOSIS — E236 Other disorders of pituitary gland: Secondary | ICD-10-CM | POA: Diagnosis not present

## 2013-01-26 DIAGNOSIS — S72002D Fracture of unspecified part of neck of left femur, subsequent encounter for closed fracture with routine healing: Secondary | ICD-10-CM

## 2013-01-26 DIAGNOSIS — S72009D Fracture of unspecified part of neck of unspecified femur, subsequent encounter for closed fracture with routine healing: Secondary | ICD-10-CM

## 2013-01-26 DIAGNOSIS — E222 Syndrome of inappropriate secretion of antidiuretic hormone: Secondary | ICD-10-CM

## 2013-01-26 NOTE — Progress Notes (Signed)
Patient ID: [name], female   DOB: [birth year], Facility [facility] Chief complaint predischarge review History; [name] is a lady initially came to [place] after a left hip fracture status post ORIF. She had had several falls prior to this that were non-syncopal. She is done well in terms of rehabilitation here and will be going back with a walker. The issues are recently have been hyponatremia. As far as I could tell she seems to run a low sodium between 1:30 and 132. She developed a UTI late last month and pushed fluids on herself. Her sodium got down to 123. The pattern of this at that time was consistent with SIADH her urine osmolality was 358 urine sodium 33 vs. a serum osmolality of 269 and a serum sodium of 123. With simple fluid restriction to 1200 cc her sodium has come up to 132. I suspect she has a reset Osmostat. The deterioration in her serum sodium to 123 driven by an excess fluid intake but the patient admits was happening at the time she was on antibiotics for UTI.   She was also transfused 2 units somewhere in this time frame (Sept 23). I am not particularly sure why it was felt necessary to transfuse her as her hemoglobin was coming up nicely on its own although I may not have complete information on her. Her posttransfusion hemoglobin was 10.9 on September 29. I was not aware of this when I reviewed her for the hyponatremia with a sodium of 123. I hope that they did not draw the lab work out of her PICC line might have provided an additional explanation for the hyponatremia. In any case his sodium has come up to 132, according to what I know this is a chronic issue.   Review of systems Respiratory no shortness of breath Cardiac No chest pain GI no abdominal pain or diarrhea.  Examination; her vital signs are stable Respiratory clear entry bilaterally Cardiac heart sounds are normal. She is euvolemic Ext; no edema   Impression/plan #1 status post ORIF of the left hip; she is  rehabilitated satisfactorily. She will need a rolling walker. She was off her postoperative for DVT prophylaxis. 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 If this is a chronic problem dating back years I would be quite confident about this. If it is more recent she may need CNS M. and chest imaging. I suspect the recent deterioration was due to a combination of increased fluid intake perhaps the transfusion. As mentioned I was not aware of her transfusion #3 anemia her stool tested guaiac negative for. She was transfused from a hemoglobin of 8-10.9. I'm not completely certain why he was also strongly to give her 2 units however she is stable for discharge.  I am not planning to send her back to assisted living on a fluid restriction. I am not confident that assisted living could manage her fluid restriction. We will need to see how she follows up but I am confident that Dr. Margo Aye can follow this up. I have asked her to take her to be seen in the week with followup chemistry at that point

## 2013-01-27 DIAGNOSIS — H543 Unqualified visual loss, both eyes: Secondary | ICD-10-CM | POA: Diagnosis not present

## 2013-01-27 DIAGNOSIS — I10 Essential (primary) hypertension: Secondary | ICD-10-CM | POA: Diagnosis not present

## 2013-01-27 DIAGNOSIS — S72009D Fracture of unspecified part of neck of unspecified femur, subsequent encounter for closed fracture with routine healing: Secondary | ICD-10-CM | POA: Diagnosis not present

## 2013-01-27 DIAGNOSIS — H3530 Unspecified macular degeneration: Secondary | ICD-10-CM | POA: Diagnosis not present

## 2013-01-28 DIAGNOSIS — S72009D Fracture of unspecified part of neck of unspecified femur, subsequent encounter for closed fracture with routine healing: Secondary | ICD-10-CM | POA: Diagnosis not present

## 2013-01-28 DIAGNOSIS — I10 Essential (primary) hypertension: Secondary | ICD-10-CM | POA: Diagnosis not present

## 2013-01-28 DIAGNOSIS — H3530 Unspecified macular degeneration: Secondary | ICD-10-CM | POA: Diagnosis not present

## 2013-01-28 DIAGNOSIS — H543 Unqualified visual loss, both eyes: Secondary | ICD-10-CM | POA: Diagnosis not present

## 2013-01-29 DIAGNOSIS — H3530 Unspecified macular degeneration: Secondary | ICD-10-CM | POA: Diagnosis not present

## 2013-01-29 DIAGNOSIS — I10 Essential (primary) hypertension: Secondary | ICD-10-CM | POA: Diagnosis not present

## 2013-01-29 DIAGNOSIS — S72009D Fracture of unspecified part of neck of unspecified femur, subsequent encounter for closed fracture with routine healing: Secondary | ICD-10-CM | POA: Diagnosis not present

## 2013-01-29 DIAGNOSIS — H543 Unqualified visual loss, both eyes: Secondary | ICD-10-CM | POA: Diagnosis not present

## 2013-01-30 DIAGNOSIS — I10 Essential (primary) hypertension: Secondary | ICD-10-CM | POA: Diagnosis not present

## 2013-01-30 DIAGNOSIS — S72009D Fracture of unspecified part of neck of unspecified femur, subsequent encounter for closed fracture with routine healing: Secondary | ICD-10-CM | POA: Diagnosis not present

## 2013-01-30 DIAGNOSIS — H543 Unqualified visual loss, both eyes: Secondary | ICD-10-CM | POA: Diagnosis not present

## 2013-01-30 DIAGNOSIS — H3530 Unspecified macular degeneration: Secondary | ICD-10-CM | POA: Diagnosis not present

## 2013-02-02 DIAGNOSIS — D649 Anemia, unspecified: Secondary | ICD-10-CM | POA: Diagnosis not present

## 2013-02-02 DIAGNOSIS — H3530 Unspecified macular degeneration: Secondary | ICD-10-CM | POA: Diagnosis not present

## 2013-02-02 DIAGNOSIS — I10 Essential (primary) hypertension: Secondary | ICD-10-CM | POA: Diagnosis not present

## 2013-02-02 DIAGNOSIS — H543 Unqualified visual loss, both eyes: Secondary | ICD-10-CM | POA: Diagnosis not present

## 2013-02-02 DIAGNOSIS — S72009D Fracture of unspecified part of neck of unspecified femur, subsequent encounter for closed fracture with routine healing: Secondary | ICD-10-CM | POA: Diagnosis not present

## 2013-02-02 DIAGNOSIS — R5381 Other malaise: Secondary | ICD-10-CM | POA: Diagnosis not present

## 2013-02-02 DIAGNOSIS — E871 Hypo-osmolality and hyponatremia: Secondary | ICD-10-CM | POA: Diagnosis not present

## 2013-02-02 DIAGNOSIS — E039 Hypothyroidism, unspecified: Secondary | ICD-10-CM | POA: Diagnosis not present

## 2013-02-03 DIAGNOSIS — H543 Unqualified visual loss, both eyes: Secondary | ICD-10-CM | POA: Diagnosis not present

## 2013-02-03 DIAGNOSIS — S72009D Fracture of unspecified part of neck of unspecified femur, subsequent encounter for closed fracture with routine healing: Secondary | ICD-10-CM | POA: Diagnosis not present

## 2013-02-03 DIAGNOSIS — I10 Essential (primary) hypertension: Secondary | ICD-10-CM | POA: Diagnosis not present

## 2013-02-03 DIAGNOSIS — H3530 Unspecified macular degeneration: Secondary | ICD-10-CM | POA: Diagnosis not present

## 2013-02-04 DIAGNOSIS — H3530 Unspecified macular degeneration: Secondary | ICD-10-CM | POA: Diagnosis not present

## 2013-02-04 DIAGNOSIS — S72009D Fracture of unspecified part of neck of unspecified femur, subsequent encounter for closed fracture with routine healing: Secondary | ICD-10-CM | POA: Diagnosis not present

## 2013-02-04 DIAGNOSIS — I10 Essential (primary) hypertension: Secondary | ICD-10-CM | POA: Diagnosis not present

## 2013-02-04 DIAGNOSIS — H543 Unqualified visual loss, both eyes: Secondary | ICD-10-CM | POA: Diagnosis not present

## 2013-02-05 DIAGNOSIS — M79609 Pain in unspecified limb: Secondary | ICD-10-CM | POA: Diagnosis not present

## 2013-02-05 DIAGNOSIS — I10 Essential (primary) hypertension: Secondary | ICD-10-CM | POA: Diagnosis not present

## 2013-02-05 DIAGNOSIS — H543 Unqualified visual loss, both eyes: Secondary | ICD-10-CM | POA: Diagnosis not present

## 2013-02-05 DIAGNOSIS — L600 Ingrowing nail: Secondary | ICD-10-CM | POA: Diagnosis not present

## 2013-02-05 DIAGNOSIS — S72009D Fracture of unspecified part of neck of unspecified femur, subsequent encounter for closed fracture with routine healing: Secondary | ICD-10-CM | POA: Diagnosis not present

## 2013-02-05 DIAGNOSIS — H3530 Unspecified macular degeneration: Secondary | ICD-10-CM | POA: Diagnosis not present

## 2013-02-06 DIAGNOSIS — H3530 Unspecified macular degeneration: Secondary | ICD-10-CM | POA: Diagnosis not present

## 2013-02-06 DIAGNOSIS — S72009D Fracture of unspecified part of neck of unspecified femur, subsequent encounter for closed fracture with routine healing: Secondary | ICD-10-CM | POA: Diagnosis not present

## 2013-02-06 DIAGNOSIS — I10 Essential (primary) hypertension: Secondary | ICD-10-CM | POA: Diagnosis not present

## 2013-02-06 DIAGNOSIS — H543 Unqualified visual loss, both eyes: Secondary | ICD-10-CM | POA: Diagnosis not present

## 2013-02-07 DIAGNOSIS — S72009D Fracture of unspecified part of neck of unspecified femur, subsequent encounter for closed fracture with routine healing: Secondary | ICD-10-CM | POA: Diagnosis not present

## 2013-02-07 DIAGNOSIS — H3530 Unspecified macular degeneration: Secondary | ICD-10-CM | POA: Diagnosis not present

## 2013-02-07 DIAGNOSIS — H543 Unqualified visual loss, both eyes: Secondary | ICD-10-CM | POA: Diagnosis not present

## 2013-02-07 DIAGNOSIS — I10 Essential (primary) hypertension: Secondary | ICD-10-CM | POA: Diagnosis not present

## 2013-02-08 DIAGNOSIS — I10 Essential (primary) hypertension: Secondary | ICD-10-CM | POA: Diagnosis not present

## 2013-02-08 DIAGNOSIS — S72009D Fracture of unspecified part of neck of unspecified femur, subsequent encounter for closed fracture with routine healing: Secondary | ICD-10-CM | POA: Diagnosis not present

## 2013-02-08 DIAGNOSIS — H543 Unqualified visual loss, both eyes: Secondary | ICD-10-CM | POA: Diagnosis not present

## 2013-02-08 DIAGNOSIS — H3530 Unspecified macular degeneration: Secondary | ICD-10-CM | POA: Diagnosis not present

## 2013-02-09 DIAGNOSIS — H3530 Unspecified macular degeneration: Secondary | ICD-10-CM | POA: Diagnosis not present

## 2013-02-09 DIAGNOSIS — H543 Unqualified visual loss, both eyes: Secondary | ICD-10-CM | POA: Diagnosis not present

## 2013-02-09 DIAGNOSIS — S72009D Fracture of unspecified part of neck of unspecified femur, subsequent encounter for closed fracture with routine healing: Secondary | ICD-10-CM | POA: Diagnosis not present

## 2013-02-09 DIAGNOSIS — I10 Essential (primary) hypertension: Secondary | ICD-10-CM | POA: Diagnosis not present

## 2013-02-10 DIAGNOSIS — I10 Essential (primary) hypertension: Secondary | ICD-10-CM | POA: Diagnosis not present

## 2013-02-10 DIAGNOSIS — H543 Unqualified visual loss, both eyes: Secondary | ICD-10-CM | POA: Diagnosis not present

## 2013-02-10 DIAGNOSIS — H3530 Unspecified macular degeneration: Secondary | ICD-10-CM | POA: Diagnosis not present

## 2013-02-10 DIAGNOSIS — S72009D Fracture of unspecified part of neck of unspecified femur, subsequent encounter for closed fracture with routine healing: Secondary | ICD-10-CM | POA: Diagnosis not present

## 2013-02-11 DIAGNOSIS — H3530 Unspecified macular degeneration: Secondary | ICD-10-CM | POA: Diagnosis not present

## 2013-02-11 DIAGNOSIS — I10 Essential (primary) hypertension: Secondary | ICD-10-CM | POA: Diagnosis not present

## 2013-02-11 DIAGNOSIS — S72009D Fracture of unspecified part of neck of unspecified femur, subsequent encounter for closed fracture with routine healing: Secondary | ICD-10-CM | POA: Diagnosis not present

## 2013-02-11 DIAGNOSIS — H543 Unqualified visual loss, both eyes: Secondary | ICD-10-CM | POA: Diagnosis not present

## 2013-02-13 DIAGNOSIS — Z8781 Personal history of (healed) traumatic fracture: Secondary | ICD-10-CM | POA: Diagnosis not present

## 2013-02-14 DIAGNOSIS — I10 Essential (primary) hypertension: Secondary | ICD-10-CM | POA: Diagnosis not present

## 2013-02-14 DIAGNOSIS — H3530 Unspecified macular degeneration: Secondary | ICD-10-CM | POA: Diagnosis not present

## 2013-02-14 DIAGNOSIS — S72009D Fracture of unspecified part of neck of unspecified femur, subsequent encounter for closed fracture with routine healing: Secondary | ICD-10-CM | POA: Diagnosis not present

## 2013-02-14 DIAGNOSIS — H543 Unqualified visual loss, both eyes: Secondary | ICD-10-CM | POA: Diagnosis not present

## 2013-02-17 DIAGNOSIS — I10 Essential (primary) hypertension: Secondary | ICD-10-CM | POA: Diagnosis not present

## 2013-02-17 DIAGNOSIS — S72009D Fracture of unspecified part of neck of unspecified femur, subsequent encounter for closed fracture with routine healing: Secondary | ICD-10-CM | POA: Diagnosis not present

## 2013-02-17 DIAGNOSIS — H35369 Drusen (degenerative) of macula, unspecified eye: Secondary | ICD-10-CM | POA: Diagnosis not present

## 2013-02-17 DIAGNOSIS — H543 Unqualified visual loss, both eyes: Secondary | ICD-10-CM | POA: Diagnosis not present

## 2013-02-17 DIAGNOSIS — H353190 Nonexudative age-related macular degeneration, unspecified eye, stage unspecified: Secondary | ICD-10-CM | POA: Diagnosis not present

## 2013-02-17 DIAGNOSIS — H3530 Unspecified macular degeneration: Secondary | ICD-10-CM | POA: Diagnosis not present

## 2013-02-17 DIAGNOSIS — H35359 Cystoid macular degeneration, unspecified eye: Secondary | ICD-10-CM | POA: Diagnosis not present

## 2013-02-18 DIAGNOSIS — I10 Essential (primary) hypertension: Secondary | ICD-10-CM | POA: Diagnosis not present

## 2013-02-18 DIAGNOSIS — H3530 Unspecified macular degeneration: Secondary | ICD-10-CM | POA: Diagnosis not present

## 2013-02-18 DIAGNOSIS — H543 Unqualified visual loss, both eyes: Secondary | ICD-10-CM | POA: Diagnosis not present

## 2013-02-18 DIAGNOSIS — S72009D Fracture of unspecified part of neck of unspecified femur, subsequent encounter for closed fracture with routine healing: Secondary | ICD-10-CM | POA: Diagnosis not present

## 2013-02-19 DIAGNOSIS — S72009D Fracture of unspecified part of neck of unspecified femur, subsequent encounter for closed fracture with routine healing: Secondary | ICD-10-CM | POA: Diagnosis not present

## 2013-02-19 DIAGNOSIS — H3530 Unspecified macular degeneration: Secondary | ICD-10-CM | POA: Diagnosis not present

## 2013-02-19 DIAGNOSIS — I10 Essential (primary) hypertension: Secondary | ICD-10-CM | POA: Diagnosis not present

## 2013-02-19 DIAGNOSIS — H543 Unqualified visual loss, both eyes: Secondary | ICD-10-CM | POA: Diagnosis not present

## 2013-02-20 DIAGNOSIS — I10 Essential (primary) hypertension: Secondary | ICD-10-CM | POA: Diagnosis not present

## 2013-02-20 DIAGNOSIS — H543 Unqualified visual loss, both eyes: Secondary | ICD-10-CM | POA: Diagnosis not present

## 2013-02-20 DIAGNOSIS — H3530 Unspecified macular degeneration: Secondary | ICD-10-CM | POA: Diagnosis not present

## 2013-02-20 DIAGNOSIS — S72009D Fracture of unspecified part of neck of unspecified femur, subsequent encounter for closed fracture with routine healing: Secondary | ICD-10-CM | POA: Diagnosis not present

## 2013-02-23 DIAGNOSIS — I10 Essential (primary) hypertension: Secondary | ICD-10-CM | POA: Diagnosis not present

## 2013-02-23 DIAGNOSIS — H3530 Unspecified macular degeneration: Secondary | ICD-10-CM | POA: Diagnosis not present

## 2013-02-23 DIAGNOSIS — H543 Unqualified visual loss, both eyes: Secondary | ICD-10-CM | POA: Diagnosis not present

## 2013-02-23 DIAGNOSIS — S72009D Fracture of unspecified part of neck of unspecified femur, subsequent encounter for closed fracture with routine healing: Secondary | ICD-10-CM | POA: Diagnosis not present

## 2013-02-24 DIAGNOSIS — D649 Anemia, unspecified: Secondary | ICD-10-CM | POA: Diagnosis not present

## 2013-02-24 DIAGNOSIS — H543 Unqualified visual loss, both eyes: Secondary | ICD-10-CM | POA: Diagnosis not present

## 2013-02-24 DIAGNOSIS — H3530 Unspecified macular degeneration: Secondary | ICD-10-CM | POA: Diagnosis not present

## 2013-02-24 DIAGNOSIS — E039 Hypothyroidism, unspecified: Secondary | ICD-10-CM | POA: Diagnosis not present

## 2013-02-24 DIAGNOSIS — I10 Essential (primary) hypertension: Secondary | ICD-10-CM | POA: Diagnosis not present

## 2013-02-24 DIAGNOSIS — S72009D Fracture of unspecified part of neck of unspecified femur, subsequent encounter for closed fracture with routine healing: Secondary | ICD-10-CM | POA: Diagnosis not present

## 2013-02-26 DIAGNOSIS — H3530 Unspecified macular degeneration: Secondary | ICD-10-CM | POA: Diagnosis not present

## 2013-02-26 DIAGNOSIS — I10 Essential (primary) hypertension: Secondary | ICD-10-CM | POA: Diagnosis not present

## 2013-02-26 DIAGNOSIS — S72009D Fracture of unspecified part of neck of unspecified femur, subsequent encounter for closed fracture with routine healing: Secondary | ICD-10-CM | POA: Diagnosis not present

## 2013-02-26 DIAGNOSIS — H543 Unqualified visual loss, both eyes: Secondary | ICD-10-CM | POA: Diagnosis not present

## 2013-02-27 DIAGNOSIS — I10 Essential (primary) hypertension: Secondary | ICD-10-CM | POA: Diagnosis not present

## 2013-02-27 DIAGNOSIS — S72009D Fracture of unspecified part of neck of unspecified femur, subsequent encounter for closed fracture with routine healing: Secondary | ICD-10-CM | POA: Diagnosis not present

## 2013-02-27 DIAGNOSIS — H3530 Unspecified macular degeneration: Secondary | ICD-10-CM | POA: Diagnosis not present

## 2013-02-27 DIAGNOSIS — H543 Unqualified visual loss, both eyes: Secondary | ICD-10-CM | POA: Diagnosis not present

## 2013-03-02 DIAGNOSIS — M6281 Muscle weakness (generalized): Secondary | ICD-10-CM | POA: Diagnosis not present

## 2013-03-02 DIAGNOSIS — R262 Difficulty in walking, not elsewhere classified: Secondary | ICD-10-CM | POA: Diagnosis not present

## 2013-03-02 DIAGNOSIS — S72009D Fracture of unspecified part of neck of unspecified femur, subsequent encounter for closed fracture with routine healing: Secondary | ICD-10-CM | POA: Diagnosis not present

## 2013-03-03 DIAGNOSIS — R262 Difficulty in walking, not elsewhere classified: Secondary | ICD-10-CM | POA: Diagnosis not present

## 2013-03-03 DIAGNOSIS — M6281 Muscle weakness (generalized): Secondary | ICD-10-CM | POA: Diagnosis not present

## 2013-03-03 DIAGNOSIS — S72009D Fracture of unspecified part of neck of unspecified femur, subsequent encounter for closed fracture with routine healing: Secondary | ICD-10-CM | POA: Diagnosis not present

## 2013-03-04 DIAGNOSIS — R262 Difficulty in walking, not elsewhere classified: Secondary | ICD-10-CM | POA: Diagnosis not present

## 2013-03-04 DIAGNOSIS — M6281 Muscle weakness (generalized): Secondary | ICD-10-CM | POA: Diagnosis not present

## 2013-03-04 DIAGNOSIS — S72009D Fracture of unspecified part of neck of unspecified femur, subsequent encounter for closed fracture with routine healing: Secondary | ICD-10-CM | POA: Diagnosis not present

## 2013-03-05 DIAGNOSIS — M6281 Muscle weakness (generalized): Secondary | ICD-10-CM | POA: Diagnosis not present

## 2013-03-05 DIAGNOSIS — S72009D Fracture of unspecified part of neck of unspecified femur, subsequent encounter for closed fracture with routine healing: Secondary | ICD-10-CM | POA: Diagnosis not present

## 2013-03-05 DIAGNOSIS — R262 Difficulty in walking, not elsewhere classified: Secondary | ICD-10-CM | POA: Diagnosis not present

## 2013-03-06 DIAGNOSIS — S72009D Fracture of unspecified part of neck of unspecified femur, subsequent encounter for closed fracture with routine healing: Secondary | ICD-10-CM | POA: Diagnosis not present

## 2013-03-06 DIAGNOSIS — R262 Difficulty in walking, not elsewhere classified: Secondary | ICD-10-CM | POA: Diagnosis not present

## 2013-03-06 DIAGNOSIS — M6281 Muscle weakness (generalized): Secondary | ICD-10-CM | POA: Diagnosis not present

## 2013-03-09 DIAGNOSIS — R262 Difficulty in walking, not elsewhere classified: Secondary | ICD-10-CM | POA: Diagnosis not present

## 2013-03-09 DIAGNOSIS — M6281 Muscle weakness (generalized): Secondary | ICD-10-CM | POA: Diagnosis not present

## 2013-03-09 DIAGNOSIS — S72009D Fracture of unspecified part of neck of unspecified femur, subsequent encounter for closed fracture with routine healing: Secondary | ICD-10-CM | POA: Diagnosis not present

## 2013-03-10 DIAGNOSIS — M6281 Muscle weakness (generalized): Secondary | ICD-10-CM | POA: Diagnosis not present

## 2013-03-10 DIAGNOSIS — S72009D Fracture of unspecified part of neck of unspecified femur, subsequent encounter for closed fracture with routine healing: Secondary | ICD-10-CM | POA: Diagnosis not present

## 2013-03-10 DIAGNOSIS — R262 Difficulty in walking, not elsewhere classified: Secondary | ICD-10-CM | POA: Diagnosis not present

## 2013-03-11 DIAGNOSIS — R262 Difficulty in walking, not elsewhere classified: Secondary | ICD-10-CM | POA: Diagnosis not present

## 2013-03-11 DIAGNOSIS — S72009D Fracture of unspecified part of neck of unspecified femur, subsequent encounter for closed fracture with routine healing: Secondary | ICD-10-CM | POA: Diagnosis not present

## 2013-03-11 DIAGNOSIS — M6281 Muscle weakness (generalized): Secondary | ICD-10-CM | POA: Diagnosis not present

## 2013-03-12 DIAGNOSIS — S72009D Fracture of unspecified part of neck of unspecified femur, subsequent encounter for closed fracture with routine healing: Secondary | ICD-10-CM | POA: Diagnosis not present

## 2013-03-12 DIAGNOSIS — M6281 Muscle weakness (generalized): Secondary | ICD-10-CM | POA: Diagnosis not present

## 2013-03-12 DIAGNOSIS — R262 Difficulty in walking, not elsewhere classified: Secondary | ICD-10-CM | POA: Diagnosis not present

## 2013-03-13 DIAGNOSIS — R262 Difficulty in walking, not elsewhere classified: Secondary | ICD-10-CM | POA: Diagnosis not present

## 2013-03-13 DIAGNOSIS — S72009D Fracture of unspecified part of neck of unspecified femur, subsequent encounter for closed fracture with routine healing: Secondary | ICD-10-CM | POA: Diagnosis not present

## 2013-03-13 DIAGNOSIS — M6281 Muscle weakness (generalized): Secondary | ICD-10-CM | POA: Diagnosis not present

## 2013-03-16 ENCOUNTER — Encounter: Payer: Self-pay | Admitting: Internal Medicine

## 2013-03-16 ENCOUNTER — Other Ambulatory Visit: Payer: Self-pay | Admitting: Internal Medicine

## 2013-03-16 ENCOUNTER — Ambulatory Visit (INDEPENDENT_AMBULATORY_CARE_PROVIDER_SITE_OTHER): Payer: Medicare Other | Admitting: *Deleted

## 2013-03-16 DIAGNOSIS — M6281 Muscle weakness (generalized): Secondary | ICD-10-CM | POA: Diagnosis not present

## 2013-03-16 DIAGNOSIS — I498 Other specified cardiac arrhythmias: Secondary | ICD-10-CM

## 2013-03-16 DIAGNOSIS — S72009D Fracture of unspecified part of neck of unspecified femur, subsequent encounter for closed fracture with routine healing: Secondary | ICD-10-CM | POA: Diagnosis not present

## 2013-03-16 DIAGNOSIS — R262 Difficulty in walking, not elsewhere classified: Secondary | ICD-10-CM | POA: Diagnosis not present

## 2013-03-17 DIAGNOSIS — R262 Difficulty in walking, not elsewhere classified: Secondary | ICD-10-CM | POA: Diagnosis not present

## 2013-03-17 DIAGNOSIS — M6281 Muscle weakness (generalized): Secondary | ICD-10-CM | POA: Diagnosis not present

## 2013-03-17 DIAGNOSIS — S72009D Fracture of unspecified part of neck of unspecified femur, subsequent encounter for closed fracture with routine healing: Secondary | ICD-10-CM | POA: Diagnosis not present

## 2013-03-20 LAB — MDC_IDC_ENUM_SESS_TYPE_REMOTE
Battery Remaining Longevity: 92 mo
Battery Voltage: 2.95 V
Brady Statistic RA Percent Paced: 30 %
Brady Statistic RV Percent Paced: 1 %
Date Time Interrogation Session: 20141124070018
Lead Channel Impedance Value: 390 Ohm
Lead Channel Pacing Threshold Amplitude: 0.5 V
Lead Channel Sensing Intrinsic Amplitude: 3.6 mV
Lead Channel Setting Pacing Amplitude: 1.25 V
Lead Channel Setting Sensing Sensitivity: 2 mV

## 2013-03-23 DIAGNOSIS — S72009D Fracture of unspecified part of neck of unspecified femur, subsequent encounter for closed fracture with routine healing: Secondary | ICD-10-CM | POA: Diagnosis not present

## 2013-03-23 DIAGNOSIS — R262 Difficulty in walking, not elsewhere classified: Secondary | ICD-10-CM | POA: Diagnosis not present

## 2013-03-23 DIAGNOSIS — M6281 Muscle weakness (generalized): Secondary | ICD-10-CM | POA: Diagnosis not present

## 2013-03-25 DIAGNOSIS — E039 Hypothyroidism, unspecified: Secondary | ICD-10-CM | POA: Diagnosis not present

## 2013-03-27 ENCOUNTER — Encounter: Payer: Self-pay | Admitting: *Deleted

## 2013-03-31 DIAGNOSIS — M25569 Pain in unspecified knee: Secondary | ICD-10-CM | POA: Diagnosis not present

## 2013-03-31 DIAGNOSIS — Z4889 Encounter for other specified surgical aftercare: Secondary | ICD-10-CM | POA: Diagnosis not present

## 2013-03-31 DIAGNOSIS — M76899 Other specified enthesopathies of unspecified lower limb, excluding foot: Secondary | ICD-10-CM | POA: Diagnosis not present

## 2013-04-07 DIAGNOSIS — J069 Acute upper respiratory infection, unspecified: Secondary | ICD-10-CM | POA: Diagnosis not present

## 2013-04-07 DIAGNOSIS — E039 Hypothyroidism, unspecified: Secondary | ICD-10-CM | POA: Diagnosis not present

## 2013-04-24 DIAGNOSIS — R262 Difficulty in walking, not elsewhere classified: Secondary | ICD-10-CM | POA: Diagnosis not present

## 2013-04-24 DIAGNOSIS — M6281 Muscle weakness (generalized): Secondary | ICD-10-CM | POA: Diagnosis not present

## 2013-04-24 DIAGNOSIS — S72009D Fracture of unspecified part of neck of unspecified femur, subsequent encounter for closed fracture with routine healing: Secondary | ICD-10-CM | POA: Diagnosis not present

## 2013-05-12 DIAGNOSIS — M545 Low back pain, unspecified: Secondary | ICD-10-CM | POA: Diagnosis not present

## 2013-05-12 DIAGNOSIS — Z4889 Encounter for other specified surgical aftercare: Secondary | ICD-10-CM | POA: Diagnosis not present

## 2013-05-12 DIAGNOSIS — M1710 Unilateral primary osteoarthritis, unspecified knee: Secondary | ICD-10-CM | POA: Diagnosis not present

## 2013-05-15 DIAGNOSIS — E039 Hypothyroidism, unspecified: Secondary | ICD-10-CM | POA: Diagnosis not present

## 2013-05-19 DIAGNOSIS — E039 Hypothyroidism, unspecified: Secondary | ICD-10-CM | POA: Diagnosis not present

## 2013-05-19 DIAGNOSIS — J309 Allergic rhinitis, unspecified: Secondary | ICD-10-CM | POA: Diagnosis not present

## 2013-05-19 DIAGNOSIS — N318 Other neuromuscular dysfunction of bladder: Secondary | ICD-10-CM | POA: Diagnosis not present

## 2013-06-11 ENCOUNTER — Encounter: Payer: Self-pay | Admitting: Internal Medicine

## 2013-06-11 ENCOUNTER — Ambulatory Visit (INDEPENDENT_AMBULATORY_CARE_PROVIDER_SITE_OTHER): Payer: Medicare Other | Admitting: Internal Medicine

## 2013-06-11 VITALS — BP 132/68 | HR 77 | Ht 65.5 in | Wt 140.2 lb

## 2013-06-11 DIAGNOSIS — I498 Other specified cardiac arrhythmias: Secondary | ICD-10-CM | POA: Diagnosis not present

## 2013-06-11 DIAGNOSIS — Z950 Presence of cardiac pacemaker: Secondary | ICD-10-CM | POA: Diagnosis not present

## 2013-06-11 DIAGNOSIS — R002 Palpitations: Secondary | ICD-10-CM

## 2013-06-11 DIAGNOSIS — R001 Bradycardia, unspecified: Secondary | ICD-10-CM

## 2013-06-11 LAB — MDC_IDC_ENUM_SESS_TYPE_INCLINIC
Battery Remaining Longevity: 110.4 mo
Brady Statistic RA Percent Paced: 30 %
Brady Statistic RV Percent Paced: 0.16 %
Date Time Interrogation Session: 20150219120656
Implantable Pulse Generator Serial Number: 7316836
Lead Channel Impedance Value: 412.5 Ohm
Lead Channel Pacing Threshold Amplitude: 0.75 V
Lead Channel Pacing Threshold Amplitude: 1 V
Lead Channel Pacing Threshold Pulse Width: 0.5 ms
Lead Channel Pacing Threshold Pulse Width: 0.5 ms
Lead Channel Setting Sensing Sensitivity: 2 mV
MDC IDC MSMT BATTERY VOLTAGE: 2.96 V
MDC IDC MSMT LEADCHNL RA IMPEDANCE VALUE: 387.5 Ohm
MDC IDC MSMT LEADCHNL RA SENSING INTR AMPL: 4 mV
MDC IDC MSMT LEADCHNL RV PACING THRESHOLD AMPLITUDE: 1 V
MDC IDC MSMT LEADCHNL RV PACING THRESHOLD PULSEWIDTH: 0.5 ms
MDC IDC MSMT LEADCHNL RV SENSING INTR AMPL: 12 mV
MDC IDC SET LEADCHNL RA PACING AMPLITUDE: 2 V
MDC IDC SET LEADCHNL RV PACING AMPLITUDE: 2.5 V
MDC IDC SET LEADCHNL RV PACING PULSEWIDTH: 0.5 ms

## 2013-06-11 NOTE — Patient Instructions (Signed)
Your physician wants you to follow-up in: 12 months in the [facility] office with Dr Knox Saliva will receive a reminder letter in the mail two months in advance. If you don't receive a letter, please call our office to schedule the follow-up appointment.   Remote monitoring is used to monitor your Pacemaker or ICD from home. This monitoring reduces the number of office visits required to check your device to one time per year. It allows [place] to keep an eye on the functioning of your device to ensure it is working properly. You are scheduled for a device check from home on 09/15/13. You may send your transmission at any time that day. If you have a wireless device, the transmission will be sent automatically. After your physician reviews your transmission, you will receive a postcard with your next transmission date.

## 2013-06-11 NOTE — Progress Notes (Signed)
HPI  [name] returns today for followup. She is a very pleasant 80 year old woman with a history of hypertension, palpitations, symptomatic bradycardia, status post permanent pacemaker insertion. In the interim, she has done well except she notes that she broke her left hip in September 2014. She notes that she has occaisional episodes of chest pain lasting seconds, not related to exerertion, and not associated with palpitations. Allergies  Allergen Reactions  . Tramadol Itching and Nausea Only  . Sulfa Antibiotics Nausea And Vomiting     Current Outpatient Prescriptions  Medication Sig Dispense Refill  . acetaminophen (TYLENOL) 325 MG tablet Take 650 mg by mouth every 6 (six) hours as needed.      . ALPRAZolam (XANAX) 0.25 MG tablet Take 1 tablet (0.25 mg total) by mouth every 8 (eight) hours as needed for sleep or anxiety. For anxiety  90 tablet  5  . amLODipine (NORVASC) 10 MG tablet Take 0.5 tablets (5 mg total) by mouth daily.  15 tablet  2  . aspirin 325 MG EC tablet Take 325 mg by mouth daily with breakfast.      . beta carotene w/minerals (OCUVITE) tablet Take 1 tablet by mouth 2 (two) times daily.      . cetirizine (ZYRTEC) 10 MG tablet Take 10 mg by mouth at bedtime.      . cycloSPORINE (RESTASIS) 0.05 % ophthalmic emulsion Place 1 drop into both eyes 2 (two) times daily.       Marland Kitchen docusate sodium (COLACE) 100 MG capsule Take 100 mg by mouth at bedtime.      . fluticasone (FLONASE) 50 MCG/ACT nasal spray Place 2 sprays into the nose daily. For allergies      . hydrALAZINE (APRESOLINE) 25 MG tablet Take 25 mg by mouth 2 (two) times daily.        Marland Kitchen HYDROcodone-acetaminophen (NORCO/VICODIN) 5-325 MG per tablet Take 1 tablet by mouth at bedtime as needed.      Marland Kitchen levothyroxine (SYNTHROID, LEVOTHROID) 112 MCG tablet Take 112 mcg by mouth daily before breakfast.      . lisinopril (PRINIVIL,ZESTRIL) 20 MG tablet Take 20 mg by mouth 2 (two) times daily.       Marland Kitchen loperamide (IMODIUM) 2 MG  capsule Take 2 mg by mouth every 4 (four) hours as needed for diarrhea or loose stools.      . Magnesium 250 MG TABS Take 1 tablet by mouth every evening.      . Melatonin 1 MG TABS Take 1 tablet by mouth at bedtime.      . Multiple Vitamins-Minerals (THERATRUM COMPLETE PO) Take 1 tablet by mouth 2 (two) times daily.      Marland Kitchen omeprazole (PRILOSEC) 20 MG capsule Take 20 mg by mouth every morning.       . polyethylene glycol powder (GLYCOLAX/MIRALAX) powder Take 17 g by mouth daily. For constipation. *One capful to be mixed in water or juice*      . sodium phosphate (FLEET) 7-19 GM/118ML ENEM Place 1 enema rectally every other day as needed (if patient has no BM for 2 days).    0  . Wheat Dextrin (BENEFIBER DRINK MIX PO) Take by mouth daily. Mix 2 teaspoonfuls with water water and drink daily.       No current facility-administered medications for this visit.     Past Medical History  Diagnosis Date  . Hypertension   . Hypothyroidism   . Bradycardia   . Anemia   . Diverticulosis   .  External hemorrhoids   . Constipation   . Macular degeneration   . Seasonal allergies   . Osteoporosis   . Degenerative joint disease   . Palpitations   . Hyperlipidemia   . GERD (gastroesophageal reflux disease)   . Pacemaker   . Thyrotoxicosis without mention of goiter or other cause, without mention of thyrotoxic crisis or storm   . Goiter, unspecified     ROS:   All systems reviewed and negative except as noted in the HPI.   Past Surgical History  Procedure Laterality Date  . Thyroidectomy    . Tonsillectomy    . Colonoscopy  November 2011    External hemorrhoids, diverticulosis, anal papillae  . Insert / replace / remove pacemaker    . Vertebroplasty    . Femur im nail Left 12/29/2012    Procedure: INTRAMEDULLARY (IM) NAIL INTERTROCHANTRIC;  Surgeon: Melina Schools, MD;  Location: [facility];  Service: Orthopedics;  Laterality: Left;     Family History  Problem Relation Age of Onset  . Heart  attack Mother     Deceased  . Cancer Father     Deceased  . Cancer Brother     Deceased     History   Social History  . Marital Status: Widowed    Spouse Name: N/A    Number of Children: N/A  . Years of Education: N/A   Occupational History  . Not on file.   Social History Main Topics  . Smoking status: Never Smoker   . Smokeless tobacco: Never Used  . Alcohol Use: No  . Drug Use: No  . Sexual Activity: Not on file   Other Topics Concern  . Not on file   Social History Narrative  . No narrative on file     BP 132/68  Pulse 77  Ht 5' 5.5" (1.664 m)  Wt 140 lb 3.2 oz (63.594 kg)  BMI 22.97 kg/m2  Physical Exam:  Well appearing 80 year old woman,NAD HEENT: Unremarkable Neck:  7 cm JVD, no thyromegally Lungs:  Clear with no wheezes, rales, or rhonchi. HEART:  Regular rate rhythm, no murmurs, no rubs, no clicks Abd:  soft, positive bowel sounds, no organomegally, no rebound, no guarding Ext:  2 plus pulses, no edema, no cyanosis, no clubbing Skin:  No rashes no nodules Neuro:  CN II through XII intact, motor grossly intact  DEVICE  Normal device function.  See PaceArt for details.   Assess/Plan:

## 2013-06-11 NOTE — Assessment & Plan Note (Signed)
She had palpitations around the time of her hip fracture and interogation demonstrates non-sustained atrial tachycardia. Will follow.

## 2013-06-11 NOTE — Assessment & Plan Note (Signed)
Her [employer] DDD PM is working normally. Will recheck in several months.  

## 2013-06-15 DIAGNOSIS — H353190 Nonexudative age-related macular degeneration, unspecified eye, stage unspecified: Secondary | ICD-10-CM | POA: Diagnosis not present

## 2013-06-15 DIAGNOSIS — H35059 Retinal neovascularization, unspecified, unspecified eye: Secondary | ICD-10-CM | POA: Diagnosis not present

## 2013-06-15 DIAGNOSIS — H35359 Cystoid macular degeneration, unspecified eye: Secondary | ICD-10-CM | POA: Diagnosis not present

## 2013-06-15 DIAGNOSIS — H353290 Exudative age-related macular degeneration, unspecified eye, stage unspecified: Secondary | ICD-10-CM | POA: Diagnosis not present

## 2013-06-22 DIAGNOSIS — J069 Acute upper respiratory infection, unspecified: Secondary | ICD-10-CM | POA: Diagnosis not present

## 2013-07-13 DIAGNOSIS — M76899 Other specified enthesopathies of unspecified lower limb, excluding foot: Secondary | ICD-10-CM | POA: Diagnosis not present

## 2013-07-27 DIAGNOSIS — M76899 Other specified enthesopathies of unspecified lower limb, excluding foot: Secondary | ICD-10-CM | POA: Diagnosis not present

## 2013-07-27 DIAGNOSIS — M1710 Unilateral primary osteoarthritis, unspecified knee: Secondary | ICD-10-CM | POA: Diagnosis not present

## 2013-08-11 DIAGNOSIS — I10 Essential (primary) hypertension: Secondary | ICD-10-CM | POA: Diagnosis not present

## 2013-08-11 DIAGNOSIS — E785 Hyperlipidemia, unspecified: Secondary | ICD-10-CM | POA: Diagnosis not present

## 2013-08-11 DIAGNOSIS — E039 Hypothyroidism, unspecified: Secondary | ICD-10-CM | POA: Diagnosis not present

## 2013-08-17 DIAGNOSIS — E039 Hypothyroidism, unspecified: Secondary | ICD-10-CM | POA: Diagnosis not present

## 2013-08-17 DIAGNOSIS — E785 Hyperlipidemia, unspecified: Secondary | ICD-10-CM | POA: Diagnosis not present

## 2013-08-17 DIAGNOSIS — R351 Nocturia: Secondary | ICD-10-CM | POA: Diagnosis not present

## 2013-08-19 DIAGNOSIS — IMO0002 Reserved for concepts with insufficient information to code with codable children: Secondary | ICD-10-CM | POA: Diagnosis not present

## 2013-08-19 DIAGNOSIS — M1710 Unilateral primary osteoarthritis, unspecified knee: Secondary | ICD-10-CM | POA: Diagnosis not present

## 2013-08-26 DIAGNOSIS — M1710 Unilateral primary osteoarthritis, unspecified knee: Secondary | ICD-10-CM | POA: Diagnosis not present

## 2013-09-02 DIAGNOSIS — M1710 Unilateral primary osteoarthritis, unspecified knee: Secondary | ICD-10-CM | POA: Diagnosis not present

## 2013-09-15 ENCOUNTER — Ambulatory Visit (INDEPENDENT_AMBULATORY_CARE_PROVIDER_SITE_OTHER): Payer: Medicare Other | Admitting: *Deleted

## 2013-09-15 ENCOUNTER — Encounter: Payer: Self-pay | Admitting: Internal Medicine

## 2013-09-15 DIAGNOSIS — I498 Other specified cardiac arrhythmias: Secondary | ICD-10-CM | POA: Diagnosis not present

## 2013-09-15 NOTE — Progress Notes (Signed)
Remote pacemaker transmission.   

## 2013-09-23 LAB — MDC_IDC_ENUM_SESS_TYPE_REMOTE
Battery Remaining Longevity: 97 mo
Brady Statistic AP VS Percent: 26 %
Brady Statistic AS VP Percent: 1 %
Brady Statistic AS VS Percent: 74 %
Brady Statistic RA Percent Paced: 25 %
Date Time Interrogation Session: 20150526060019
Lead Channel Impedance Value: 410 Ohm
Lead Channel Pacing Threshold Amplitude: 0.75 V
Lead Channel Pacing Threshold Amplitude: 1 V
Lead Channel Pacing Threshold Pulse Width: 0.5 ms
Lead Channel Sensing Intrinsic Amplitude: 12 mV
Lead Channel Setting Pacing Amplitude: 2.5 V
Lead Channel Setting Pacing Pulse Width: 0.5 ms
MDC IDC MSMT BATTERY REMAINING PERCENTAGE: 81 %
MDC IDC MSMT BATTERY VOLTAGE: 2.96 V
MDC IDC MSMT LEADCHNL RA IMPEDANCE VALUE: 390 Ohm
MDC IDC MSMT LEADCHNL RA PACING THRESHOLD PULSEWIDTH: 0.5 ms
MDC IDC MSMT LEADCHNL RA SENSING INTR AMPL: 3.2 mV
MDC IDC PG SERIAL: 7316836
MDC IDC SET LEADCHNL RA PACING AMPLITUDE: 2 V
MDC IDC SET LEADCHNL RV SENSING SENSITIVITY: 2 mV
MDC IDC STAT BRADY AP VP PERCENT: 1 %
MDC IDC STAT BRADY RV PERCENT PACED: 1 %

## 2013-10-02 ENCOUNTER — Encounter: Payer: Self-pay | Admitting: Cardiology

## 2013-10-19 DIAGNOSIS — M1710 Unilateral primary osteoarthritis, unspecified knee: Secondary | ICD-10-CM | POA: Diagnosis not present

## 2013-11-16 DIAGNOSIS — M999 Biomechanical lesion, unspecified: Secondary | ICD-10-CM | POA: Diagnosis not present

## 2013-11-16 DIAGNOSIS — M47814 Spondylosis without myelopathy or radiculopathy, thoracic region: Secondary | ICD-10-CM | POA: Diagnosis not present

## 2013-11-20 DIAGNOSIS — M47814 Spondylosis without myelopathy or radiculopathy, thoracic region: Secondary | ICD-10-CM | POA: Diagnosis not present

## 2013-11-20 DIAGNOSIS — M999 Biomechanical lesion, unspecified: Secondary | ICD-10-CM | POA: Diagnosis not present

## 2013-11-25 DIAGNOSIS — M47814 Spondylosis without myelopathy or radiculopathy, thoracic region: Secondary | ICD-10-CM | POA: Diagnosis not present

## 2013-11-25 DIAGNOSIS — M999 Biomechanical lesion, unspecified: Secondary | ICD-10-CM | POA: Diagnosis not present

## 2013-11-30 DIAGNOSIS — M999 Biomechanical lesion, unspecified: Secondary | ICD-10-CM | POA: Diagnosis not present

## 2013-11-30 DIAGNOSIS — M47814 Spondylosis without myelopathy or radiculopathy, thoracic region: Secondary | ICD-10-CM | POA: Diagnosis not present

## 2013-12-07 DIAGNOSIS — M47814 Spondylosis without myelopathy or radiculopathy, thoracic region: Secondary | ICD-10-CM | POA: Diagnosis not present

## 2013-12-07 DIAGNOSIS — M999 Biomechanical lesion, unspecified: Secondary | ICD-10-CM | POA: Diagnosis not present

## 2013-12-07 DIAGNOSIS — H35369 Drusen (degenerative) of macula, unspecified eye: Secondary | ICD-10-CM | POA: Diagnosis not present

## 2013-12-07 DIAGNOSIS — H353190 Nonexudative age-related macular degeneration, unspecified eye, stage unspecified: Secondary | ICD-10-CM | POA: Diagnosis not present

## 2013-12-07 DIAGNOSIS — H353290 Exudative age-related macular degeneration, unspecified eye, stage unspecified: Secondary | ICD-10-CM | POA: Diagnosis not present

## 2013-12-07 DIAGNOSIS — H35359 Cystoid macular degeneration, unspecified eye: Secondary | ICD-10-CM | POA: Diagnosis not present

## 2013-12-14 DIAGNOSIS — M999 Biomechanical lesion, unspecified: Secondary | ICD-10-CM | POA: Diagnosis not present

## 2013-12-14 DIAGNOSIS — M47814 Spondylosis without myelopathy or radiculopathy, thoracic region: Secondary | ICD-10-CM | POA: Diagnosis not present

## 2013-12-17 DIAGNOSIS — M999 Biomechanical lesion, unspecified: Secondary | ICD-10-CM | POA: Diagnosis not present

## 2013-12-17 DIAGNOSIS — M47814 Spondylosis without myelopathy or radiculopathy, thoracic region: Secondary | ICD-10-CM | POA: Diagnosis not present

## 2013-12-21 ENCOUNTER — Encounter: Payer: Self-pay | Admitting: Internal Medicine

## 2013-12-21 ENCOUNTER — Ambulatory Visit (INDEPENDENT_AMBULATORY_CARE_PROVIDER_SITE_OTHER): Payer: Medicare Other | Admitting: *Deleted

## 2013-12-21 DIAGNOSIS — M47814 Spondylosis without myelopathy or radiculopathy, thoracic region: Secondary | ICD-10-CM | POA: Diagnosis not present

## 2013-12-21 DIAGNOSIS — M999 Biomechanical lesion, unspecified: Secondary | ICD-10-CM | POA: Diagnosis not present

## 2013-12-21 DIAGNOSIS — I498 Other specified cardiac arrhythmias: Secondary | ICD-10-CM

## 2013-12-21 LAB — MDC_IDC_ENUM_SESS_TYPE_REMOTE
Battery Remaining Percentage: 80 %
Battery Voltage: 2.96 V
Brady Statistic AP VP Percent: 1 %
Brady Statistic AS VS Percent: 73 %
Date Time Interrogation Session: 20150831074929
Lead Channel Impedance Value: 390 Ohm
Lead Channel Pacing Threshold Amplitude: 1 V
Lead Channel Pacing Threshold Pulse Width: 0.5 ms
Lead Channel Sensing Intrinsic Amplitude: 3.9 mV
Lead Channel Setting Pacing Amplitude: 2 V
Lead Channel Setting Pacing Amplitude: 2.5 V
Lead Channel Setting Pacing Pulse Width: 0.5 ms
MDC IDC MSMT BATTERY REMAINING LONGEVITY: 95 mo
MDC IDC MSMT LEADCHNL RA PACING THRESHOLD AMPLITUDE: 0.75 V
MDC IDC MSMT LEADCHNL RA PACING THRESHOLD PULSEWIDTH: 0.5 ms
MDC IDC MSMT LEADCHNL RV IMPEDANCE VALUE: 410 Ohm
MDC IDC MSMT LEADCHNL RV SENSING INTR AMPL: 12 mV
MDC IDC PG SERIAL: 7316836
MDC IDC SET LEADCHNL RV SENSING SENSITIVITY: 2 mV
MDC IDC STAT BRADY AP VS PERCENT: 27 %
MDC IDC STAT BRADY AS VP PERCENT: 1 %
MDC IDC STAT BRADY RA PERCENT PACED: 26 %
MDC IDC STAT BRADY RV PERCENT PACED: 1 %

## 2013-12-21 NOTE — Progress Notes (Signed)
Remote pacemaker transmission.   

## 2013-12-24 DIAGNOSIS — M47814 Spondylosis without myelopathy or radiculopathy, thoracic region: Secondary | ICD-10-CM | POA: Diagnosis not present

## 2013-12-24 DIAGNOSIS — M999 Biomechanical lesion, unspecified: Secondary | ICD-10-CM | POA: Diagnosis not present

## 2013-12-29 DIAGNOSIS — M47814 Spondylosis without myelopathy or radiculopathy, thoracic region: Secondary | ICD-10-CM | POA: Diagnosis not present

## 2013-12-29 DIAGNOSIS — M999 Biomechanical lesion, unspecified: Secondary | ICD-10-CM | POA: Diagnosis not present

## 2013-12-31 ENCOUNTER — Encounter: Payer: Self-pay | Admitting: Cardiology

## 2013-12-31 DIAGNOSIS — M47814 Spondylosis without myelopathy or radiculopathy, thoracic region: Secondary | ICD-10-CM | POA: Diagnosis not present

## 2013-12-31 DIAGNOSIS — M999 Biomechanical lesion, unspecified: Secondary | ICD-10-CM | POA: Diagnosis not present

## 2014-01-04 DIAGNOSIS — M47814 Spondylosis without myelopathy or radiculopathy, thoracic region: Secondary | ICD-10-CM | POA: Diagnosis not present

## 2014-01-04 DIAGNOSIS — M999 Biomechanical lesion, unspecified: Secondary | ICD-10-CM | POA: Diagnosis not present

## 2014-01-08 DIAGNOSIS — M47814 Spondylosis without myelopathy or radiculopathy, thoracic region: Secondary | ICD-10-CM | POA: Diagnosis not present

## 2014-01-08 DIAGNOSIS — M999 Biomechanical lesion, unspecified: Secondary | ICD-10-CM | POA: Diagnosis not present

## 2014-01-13 DIAGNOSIS — M999 Biomechanical lesion, unspecified: Secondary | ICD-10-CM | POA: Diagnosis not present

## 2014-01-13 DIAGNOSIS — M47814 Spondylosis without myelopathy or radiculopathy, thoracic region: Secondary | ICD-10-CM | POA: Diagnosis not present

## 2014-01-26 DIAGNOSIS — M47812 Spondylosis without myelopathy or radiculopathy, cervical region: Secondary | ICD-10-CM | POA: Diagnosis not present

## 2014-01-26 DIAGNOSIS — M9901 Segmental and somatic dysfunction of cervical region: Secondary | ICD-10-CM | POA: Diagnosis not present

## 2014-02-15 DIAGNOSIS — D509 Iron deficiency anemia, unspecified: Secondary | ICD-10-CM | POA: Diagnosis not present

## 2014-02-15 DIAGNOSIS — E782 Mixed hyperlipidemia: Secondary | ICD-10-CM | POA: Diagnosis not present

## 2014-02-15 DIAGNOSIS — E039 Hypothyroidism, unspecified: Secondary | ICD-10-CM | POA: Diagnosis not present

## 2014-02-16 DIAGNOSIS — I10 Essential (primary) hypertension: Secondary | ICD-10-CM | POA: Diagnosis not present

## 2014-02-16 DIAGNOSIS — F413 Other mixed anxiety disorders: Secondary | ICD-10-CM | POA: Diagnosis not present

## 2014-02-16 DIAGNOSIS — E038 Other specified hypothyroidism: Secondary | ICD-10-CM | POA: Diagnosis not present

## 2014-02-16 DIAGNOSIS — Z23 Encounter for immunization: Secondary | ICD-10-CM | POA: Diagnosis not present

## 2014-02-23 DIAGNOSIS — M9901 Segmental and somatic dysfunction of cervical region: Secondary | ICD-10-CM | POA: Diagnosis not present

## 2014-02-23 DIAGNOSIS — M47812 Spondylosis without myelopathy or radiculopathy, cervical region: Secondary | ICD-10-CM | POA: Diagnosis not present

## 2014-03-01 DIAGNOSIS — N390 Urinary tract infection, site not specified: Secondary | ICD-10-CM | POA: Diagnosis not present

## 2014-03-17 DIAGNOSIS — F413 Other mixed anxiety disorders: Secondary | ICD-10-CM | POA: Diagnosis not present

## 2014-03-17 DIAGNOSIS — I10 Essential (primary) hypertension: Secondary | ICD-10-CM | POA: Diagnosis not present

## 2014-03-17 DIAGNOSIS — E038 Other specified hypothyroidism: Secondary | ICD-10-CM | POA: Diagnosis not present

## 2014-03-23 ENCOUNTER — Encounter (INDEPENDENT_AMBULATORY_CARE_PROVIDER_SITE_OTHER): Payer: Self-pay | Admitting: *Deleted

## 2014-03-23 DIAGNOSIS — R2689 Other abnormalities of gait and mobility: Secondary | ICD-10-CM | POA: Diagnosis not present

## 2014-03-23 DIAGNOSIS — D500 Iron deficiency anemia secondary to blood loss (chronic): Secondary | ICD-10-CM | POA: Diagnosis not present

## 2014-03-23 DIAGNOSIS — J069 Acute upper respiratory infection, unspecified: Secondary | ICD-10-CM | POA: Diagnosis not present

## 2014-03-23 DIAGNOSIS — M6281 Muscle weakness (generalized): Secondary | ICD-10-CM | POA: Diagnosis not present

## 2014-03-23 DIAGNOSIS — Z950 Presence of cardiac pacemaker: Secondary | ICD-10-CM | POA: Diagnosis not present

## 2014-03-23 DIAGNOSIS — I10 Essential (primary) hypertension: Secondary | ICD-10-CM | POA: Diagnosis not present

## 2014-03-23 DIAGNOSIS — M47812 Spondylosis without myelopathy or radiculopathy, cervical region: Secondary | ICD-10-CM | POA: Diagnosis not present

## 2014-03-23 DIAGNOSIS — M9901 Segmental and somatic dysfunction of cervical region: Secondary | ICD-10-CM | POA: Diagnosis not present

## 2014-03-23 DIAGNOSIS — Z7982 Long term (current) use of aspirin: Secondary | ICD-10-CM | POA: Diagnosis not present

## 2014-03-24 ENCOUNTER — Ambulatory Visit (INDEPENDENT_AMBULATORY_CARE_PROVIDER_SITE_OTHER): Payer: Medicare Other | Admitting: Internal Medicine

## 2014-03-24 ENCOUNTER — Other Ambulatory Visit (INDEPENDENT_AMBULATORY_CARE_PROVIDER_SITE_OTHER): Payer: Self-pay | Admitting: *Deleted

## 2014-03-24 ENCOUNTER — Encounter (INDEPENDENT_AMBULATORY_CARE_PROVIDER_SITE_OTHER): Payer: Self-pay | Admitting: Internal Medicine

## 2014-03-24 ENCOUNTER — Ambulatory Visit (INDEPENDENT_AMBULATORY_CARE_PROVIDER_SITE_OTHER): Payer: Medicare Other | Admitting: *Deleted

## 2014-03-24 ENCOUNTER — Telehealth (INDEPENDENT_AMBULATORY_CARE_PROVIDER_SITE_OTHER): Payer: Self-pay | Admitting: *Deleted

## 2014-03-24 ENCOUNTER — Encounter: Payer: Self-pay | Admitting: Internal Medicine

## 2014-03-24 VITALS — BP 120/40 | HR 64 | Temp 98.1°F | Ht 65.5 in | Wt 142.1 lb

## 2014-03-24 DIAGNOSIS — K625 Hemorrhage of anus and rectum: Secondary | ICD-10-CM | POA: Diagnosis not present

## 2014-03-24 DIAGNOSIS — D649 Anemia, unspecified: Secondary | ICD-10-CM

## 2014-03-24 DIAGNOSIS — Z1211 Encounter for screening for malignant neoplasm of colon: Secondary | ICD-10-CM

## 2014-03-24 DIAGNOSIS — R001 Bradycardia, unspecified: Secondary | ICD-10-CM | POA: Diagnosis not present

## 2014-03-24 DIAGNOSIS — R195 Other fecal abnormalities: Secondary | ICD-10-CM

## 2014-03-24 NOTE — Progress Notes (Signed)
Remote pacemaker transmission.   

## 2014-03-24 NOTE — Patient Instructions (Signed)
Colonoscopy.  The risks and benefits such as perforation, bleeding, and infection were reviewed with the patient and is agreeable. 

## 2014-03-24 NOTE — Progress Notes (Signed)
   Subjective:    Patient ID: [name], female    DOB: 22-Apr-1923, 80 y.o.   MRN: [id number]  HPI Referred to our office for + stool card in October. She says she saw Dr. Nevada Crane and said she felt bad. She did not have any energy. She has not lost any weight. She says yesterday her left abdomen hurt off and on but not has resolved.  She usually has a BM x 2 a day. She takes daily stool softener. She occasionally has constipation.  She does have bloating frequently. She tells me the last of October her hemoglobin was 7.1 One year ago hemoglobin 8.1 after a hip fracture. Family hx of colon cancer in father.   08/11/2013 H and H 11.5 and 32.2. MCV 93.9  Her last colonoscopy was 03/22/2010 for anemia and hem positive stools.: Multiple diverticula at sigmoid and descending colon with one at the hepatic flexure. Melanosis coli. External hemorrhoids with anal papillae. No evidence of arteriovenous malformations or polyps.  Review of Systems Widowed with one child with hx of prostate cancer.    Objective:   Physical Exam Filed Vitals:   03/24/14 1421  Height: 5' 5.5" (1.664 m)  Weight: 142 lb 1.6 oz (64.456 kg)   Alert and oriented. Skin warm and dry. Oral mucosa is moist.   . Sclera anicteric, conjunctivae is pink. Thyroid not enlarged. No cervical lymphadenopathy. Lungs clear. Heart regular rate and rhythm.  Abdomen is soft. Bowel sounds are positive. No hepatomegaly. No abdominal masses felt. No tenderness.  No edema to lower extremities. Stool brown and guaiac negative.       Assessment & Plan:  Guaiac positive stool card by Dr. Nevada Crane. CBC today. Colonoscopy.The risks and benefits such as perforation, bleeding, and infection were reviewed with the patient and is agreeable.

## 2014-03-24 NOTE — Telephone Encounter (Signed)
Patient needs trilyte 

## 2014-03-24 NOTE — Progress Notes (Signed)
   Subjective:    Patient ID: [name], female    DOB: Feb 22, 1923, 80 y.o.   MRN: [id number]  HPI Referred to our office for + stool card in October. She says she saw Dr. Nevada Crane and said she felt bad. She did not have any energy. She has not lost any weight. She says yesterday her left abdomen hurt off and on but not has resolved.  She usually has a BM x 2 a day. She takes daily stool softener. She occasionally has constipation.  She does have bloating frequently.   One year ago hemoglobin 8.1 after a hip fracture. Family hx of colon cancer in father age 42  01/2014 Hemoglobin 9.4 08/11/2013 H and H 11.5 and 32.2. MCV 93.9  Her last colonoscopy was 03/22/2010 for anemia and hem positive stools.: Multiple diverticula at sigmoid and descending colon with one at the hepatic flexure. Melanosis coli. External hemorrhoids with anal papillae. No evidence of arteriovenous malformations or polyps.  Review of Systems Widowed with one child with hx of prostate cancer.    Objective:   Physical Exam Filed Vitals:   03/24/14 1421  Height: 5' 5.5" (1.664 m)  Weight: 142 lb 1.6 oz (64.456 kg)   Alert and oriented. Skin warm and dry. Oral mucosa is moist.   . Sclera anicteric, conjunctivae is pink. Thyroid not enlarged. No cervical lymphadenopathy. Lungs clear. Heart regular rate and rhythm.  Abdomen is soft. Bowel sounds are positive. No hepatomegaly. No abdominal masses felt. No tenderness.  No edema to lower extremities. Stool brown and guaiac negative.       Assessment & Plan:  Guaiac positive stool card by Dr. Nevada Crane. Colonic neoplasm, AVM, diverticular disease needs to be ruled out. Had a 2 gm drop in her hemoglobin since April. CBC today. Colonoscopy.The risks and benefits such as perforation, bleeding, and infection were reviewed with the patient and is agreeable.

## 2014-03-25 ENCOUNTER — Telehealth (INDEPENDENT_AMBULATORY_CARE_PROVIDER_SITE_OTHER): Payer: Self-pay | Admitting: Internal Medicine

## 2014-03-25 DIAGNOSIS — Z950 Presence of cardiac pacemaker: Secondary | ICD-10-CM | POA: Diagnosis not present

## 2014-03-25 DIAGNOSIS — I10 Essential (primary) hypertension: Secondary | ICD-10-CM | POA: Diagnosis not present

## 2014-03-25 DIAGNOSIS — J069 Acute upper respiratory infection, unspecified: Secondary | ICD-10-CM | POA: Diagnosis not present

## 2014-03-25 DIAGNOSIS — D500 Iron deficiency anemia secondary to blood loss (chronic): Secondary | ICD-10-CM | POA: Diagnosis not present

## 2014-03-25 DIAGNOSIS — R2689 Other abnormalities of gait and mobility: Secondary | ICD-10-CM | POA: Diagnosis not present

## 2014-03-25 DIAGNOSIS — M6281 Muscle weakness (generalized): Secondary | ICD-10-CM | POA: Diagnosis not present

## 2014-03-25 LAB — CBC WITH DIFFERENTIAL/PLATELET
BASOS ABS: 0 10*3/uL (ref 0.0–0.1)
Basophils Relative: 0 % (ref 0–1)
EOS PCT: 2 % (ref 0–5)
Eosinophils Absolute: 0.1 10*3/uL (ref 0.0–0.7)
HCT: 25.6 % — ABNORMAL LOW (ref 36.0–46.0)
Hemoglobin: 8.7 g/dL — ABNORMAL LOW (ref 12.0–15.0)
LYMPHS PCT: 38 % (ref 12–46)
Lymphs Abs: 2.1 10*3/uL (ref 0.7–4.0)
MCH: 30.1 pg (ref 26.0–34.0)
MCHC: 34 g/dL (ref 30.0–36.0)
MCV: 88.6 fL (ref 78.0–100.0)
MONO ABS: 0.8 10*3/uL (ref 0.1–1.0)
MONOS PCT: 14 % — AB (ref 3–12)
MPV: 9 fL — ABNORMAL LOW (ref 9.4–12.4)
Neutro Abs: 2.5 10*3/uL (ref 1.7–7.7)
Neutrophils Relative %: 46 % (ref 43–77)
PLATELETS: 387 10*3/uL (ref 150–400)
RBC: 2.89 MIL/uL — AB (ref 3.87–5.11)
RDW: 16.5 % — ABNORMAL HIGH (ref 11.5–15.5)
WBC: 5.4 10*3/uL (ref 4.0–10.5)

## 2014-03-25 MED ORDER — PEG 3350-KCL-NA BICARB-NACL 420 G PO SOLR: 4000.0000 mL | Freq: Once | ORAL | Status: DC

## 2014-03-25 MED ORDER — BISACODYL 5 MG PO TBEC: 10.0000 mg | DELAYED_RELEASE_TABLET | Freq: Every day | ORAL | Status: DC | PRN

## 2014-03-25 NOTE — Telephone Encounter (Signed)
error 

## 2014-03-29 DIAGNOSIS — R2689 Other abnormalities of gait and mobility: Secondary | ICD-10-CM | POA: Diagnosis not present

## 2014-03-29 DIAGNOSIS — D500 Iron deficiency anemia secondary to blood loss (chronic): Secondary | ICD-10-CM | POA: Diagnosis not present

## 2014-03-29 DIAGNOSIS — J069 Acute upper respiratory infection, unspecified: Secondary | ICD-10-CM | POA: Diagnosis not present

## 2014-03-29 DIAGNOSIS — Z950 Presence of cardiac pacemaker: Secondary | ICD-10-CM | POA: Diagnosis not present

## 2014-03-29 DIAGNOSIS — M6281 Muscle weakness (generalized): Secondary | ICD-10-CM | POA: Diagnosis not present

## 2014-03-29 DIAGNOSIS — I10 Essential (primary) hypertension: Secondary | ICD-10-CM | POA: Diagnosis not present

## 2014-03-30 ENCOUNTER — Telehealth (INDEPENDENT_AMBULATORY_CARE_PROVIDER_SITE_OTHER): Payer: Self-pay | Admitting: *Deleted

## 2014-03-30 DIAGNOSIS — I10 Essential (primary) hypertension: Secondary | ICD-10-CM | POA: Diagnosis not present

## 2014-03-30 DIAGNOSIS — D500 Iron deficiency anemia secondary to blood loss (chronic): Secondary | ICD-10-CM | POA: Diagnosis not present

## 2014-03-30 DIAGNOSIS — J069 Acute upper respiratory infection, unspecified: Secondary | ICD-10-CM | POA: Diagnosis not present

## 2014-03-30 DIAGNOSIS — R2689 Other abnormalities of gait and mobility: Secondary | ICD-10-CM | POA: Diagnosis not present

## 2014-03-30 DIAGNOSIS — Z950 Presence of cardiac pacemaker: Secondary | ICD-10-CM | POA: Diagnosis not present

## 2014-03-30 DIAGNOSIS — M6281 Muscle weakness (generalized): Secondary | ICD-10-CM | POA: Diagnosis not present

## 2014-03-30 DIAGNOSIS — K625 Hemorrhage of anus and rectum: Secondary | ICD-10-CM

## 2014-03-30 NOTE — Telephone Encounter (Signed)
.  Per Lelon Perla on 03/24/14 , the patient will need to have lab work drawn 1 week. This is noted for 03/31/14.

## 2014-03-31 LAB — CBC WITH DIFFERENTIAL/PLATELET
BASOS ABS: 0 10*3/uL (ref 0.0–0.1)
BASOS PCT: 1 % (ref 0–1)
Eosinophils Absolute: 0 10*3/uL (ref 0.0–0.7)
Eosinophils Relative: 1 % (ref 0–5)
HEMATOCRIT: 26.9 % — AB (ref 36.0–46.0)
Hemoglobin: 9.2 g/dL — ABNORMAL LOW (ref 12.0–15.0)
LYMPHS PCT: 37 % (ref 12–46)
Lymphs Abs: 1.7 10*3/uL (ref 0.7–4.0)
MCH: 30.9 pg (ref 26.0–34.0)
MCHC: 34.2 g/dL (ref 30.0–36.0)
MCV: 90.3 fL (ref 78.0–100.0)
MONO ABS: 0.6 10*3/uL (ref 0.1–1.0)
MONOS PCT: 14 % — AB (ref 3–12)
MPV: 8.9 fL — ABNORMAL LOW (ref 9.4–12.4)
NEUTROS ABS: 2.2 10*3/uL (ref 1.7–7.7)
Neutrophils Relative %: 47 % (ref 43–77)
PLATELETS: 404 10*3/uL — AB (ref 150–400)
RBC: 2.98 MIL/uL — AB (ref 3.87–5.11)
RDW: 16.8 % — ABNORMAL HIGH (ref 11.5–15.5)
WBC: 4.6 10*3/uL (ref 4.0–10.5)

## 2014-04-01 ENCOUNTER — Ambulatory Visit (HOSPITAL_COMMUNITY)
Admission: RE | Admit: 2014-04-01 | Discharge: 2014-04-01 | Disposition: A | Discharge status: Home / Self Care | Payer: Medicare Other | Source: Ambulatory Visit | Attending: Internal Medicine | Admitting: Internal Medicine

## 2014-04-01 ENCOUNTER — Encounter (HOSPITAL_COMMUNITY): Payer: Self-pay | Admitting: *Deleted

## 2014-04-01 ENCOUNTER — Encounter (HOSPITAL_COMMUNITY)
Admission: RE | Disposition: A | Discharge status: Home / Self Care | Payer: Self-pay | Source: Ambulatory Visit | Attending: Internal Medicine

## 2014-04-01 DIAGNOSIS — E039 Hypothyroidism, unspecified: Secondary | ICD-10-CM | POA: Insufficient documentation

## 2014-04-01 DIAGNOSIS — K219 Gastro-esophageal reflux disease without esophagitis: Secondary | ICD-10-CM | POA: Insufficient documentation

## 2014-04-01 DIAGNOSIS — M810 Age-related osteoporosis without current pathological fracture: Secondary | ICD-10-CM | POA: Insufficient documentation

## 2014-04-01 DIAGNOSIS — D649 Anemia, unspecified: Secondary | ICD-10-CM | POA: Diagnosis not present

## 2014-04-01 DIAGNOSIS — K6389 Other specified diseases of intestine: Secondary | ICD-10-CM | POA: Diagnosis not present

## 2014-04-01 DIAGNOSIS — Z950 Presence of cardiac pacemaker: Secondary | ICD-10-CM | POA: Diagnosis not present

## 2014-04-01 DIAGNOSIS — K6289 Other specified diseases of anus and rectum: Secondary | ICD-10-CM | POA: Diagnosis not present

## 2014-04-01 DIAGNOSIS — K644 Residual hemorrhoidal skin tags: Secondary | ICD-10-CM | POA: Insufficient documentation

## 2014-04-01 DIAGNOSIS — R195 Other fecal abnormalities: Secondary | ICD-10-CM

## 2014-04-01 DIAGNOSIS — K5730 Diverticulosis of large intestine without perforation or abscess without bleeding: Secondary | ICD-10-CM | POA: Diagnosis not present

## 2014-04-01 DIAGNOSIS — E785 Hyperlipidemia, unspecified: Secondary | ICD-10-CM | POA: Insufficient documentation

## 2014-04-01 DIAGNOSIS — Z800 Family history of malignant neoplasm of digestive organs: Secondary | ICD-10-CM | POA: Insufficient documentation

## 2014-04-01 DIAGNOSIS — I10 Essential (primary) hypertension: Secondary | ICD-10-CM | POA: Insufficient documentation

## 2014-04-01 DIAGNOSIS — Z7982 Long term (current) use of aspirin: Secondary | ICD-10-CM | POA: Insufficient documentation

## 2014-04-01 HISTORY — PX: COLONOSCOPY: SHX5424

## 2014-04-01 SURGERY — COLONOSCOPY
Anesthesia: Moderate Sedation

## 2014-04-01 MED ORDER — MIDAZOLAM HCL 5 MG/5ML IJ SOLN
INTRAMUSCULAR | Status: AC
  Filled 2014-04-01: qty 10

## 2014-04-01 MED ORDER — STERILE WATER FOR IRRIGATION IR SOLN
Status: DC | PRN
  Administered 2014-04-01: 09:00:00

## 2014-04-01 MED ORDER — MEPERIDINE HCL 50 MG/ML IJ SOLN
INTRAMUSCULAR | Status: DC | PRN
  Administered 2014-04-01: 15 mg via INTRAVENOUS
  Administered 2014-04-01: 5 mg via INTRAVENOUS
  Administered 2014-04-01 (×2): 15 mg via INTRAVENOUS

## 2014-04-01 MED ORDER — MEPERIDINE HCL 50 MG/ML IJ SOLN
INTRAMUSCULAR | Status: AC
  Filled 2014-04-01: qty 1

## 2014-04-01 MED ORDER — MIDAZOLAM HCL 5 MG/5ML IJ SOLN
INTRAMUSCULAR | Status: DC | PRN
  Administered 2014-04-01: 0.5 mg via INTRAVENOUS
  Administered 2014-04-01 (×2): 1 mg via INTRAVENOUS
  Administered 2014-04-01 (×3): 0.5 mg via INTRAVENOUS
  Administered 2014-04-01: 1 mg via INTRAVENOUS

## 2014-04-01 MED ORDER — ASPIRIN EC 81 MG PO TBEC: 81.0000 mg | DELAYED_RELEASE_TABLET | Freq: Every day | ORAL | Status: DC

## 2014-04-01 MED ORDER — SODIUM CHLORIDE 0.9 % IV SOLN
INTRAVENOUS | Status: DC
  Administered 2014-04-01: 10:00:00 via INTRAVENOUS

## 2014-04-01 NOTE — Op Note (Signed)
COLONOSCOPY PROCEDURE REPORT  PATIENT:  [name]  MR#:  [id number] Birthdate:  [birth year], 80 y.o., female Endoscopist:  Dr. Rogene Houston, MD Referred By:  Dr. Delphina Cahill, MD  Procedure Date: 04/01/2014  Procedure:   Colonoscopy  Indications:  Patient is 80 year old Caucasian female who was recent note have 2 g drop in hemoglobin and heme positive stool. She is on low-dose aspirin. Last colonoscopy was in November 2011 revealing colonic diverticulosis. Family history significant for colon carcinoma in her father who is 72 or 61 none the time of diagnosis.  Informed Consent:  The procedure and risks were reviewed with the patient and informed consent was obtained.  Medications:  Demerol 50 mg IV Versed 5 mg IV  Description of procedure:  After a digital rectal exam was performed, that colonoscope was advanced from the anus through the rectum and colon to the area of the cecum, ileocecal valve and area behind the valve was not seen. . From the level of  ileocecal valve, the scope was slowly and cautiously withdrawn. The mucosal surfaces were carefully surveyed utilizing scope tip to flexion to facilitate fold flattening as needed. The scope was pulled down into the rectum where a thorough exam including retroflexion was performed.  Findings:  Prep excellent. Lipomatous ileocecal valve. Area behind the valve was not seen. Scattered diverticula throughout the colon but most of these were at sigmoid colon and some were quite large. Normal rectal mucosa. External hemorrhoids along with a large and a small anal papillae.  Therapeutic/Diagnostic Maneuvers Performed:   None  Complications:  None  Cecal Withdrawal Time:  5 minutes  Impression:  Examination performed to cecum. Appendiceal orifice was not seen. Pancolonic diverticulosis. External hemorrhoids and 2 anal papillae.  Recommendations:  Standard instructions given. Patient will resume by mouth iron as before. She will  have CBC and iron studies on 04/19/2014. If she remains with anemia and iron studies confirm iron deficiency will consider further evaluation.  REHMAN,NAJEEB U  04/01/2014 11:02 AM  CC: Dr. Delphina Cahill, MD & Dr. Rayne Du ref. provider found

## 2014-04-01 NOTE — Discharge Instructions (Signed)
Resume usual medications including iron. High fiber diet. Will check hemoglobin and serum iron on 04/19/2014. Office will call  High-Fiber Diet Fiber is found in fruits, vegetables, and grains. A high-fiber diet encourages the addition of more whole grains, legumes, fruits, and vegetables in your diet. The recommended amount of fiber for adult males is 38 g per day. For adult females, it is 25 g per day. Pregnant and lactating women should get 28 g of fiber per day. If you have a digestive or bowel problem, ask your caregiver for advice before adding high-fiber foods to your diet. Eat a variety of high-fiber foods instead of only a select few type of foods.  PURPOSE  To increase stool bulk.  To make bowel movements more regular to prevent constipation.  To lower cholesterol.  To prevent overeating. WHEN IS THIS DIET USED?  It may be used if you have constipation and hemorrhoids.  It may be used if you have uncomplicated diverticulosis (intestine condition) and irritable bowel syndrome.  It may be used if you need help with weight management.  It may be used if you want to add it to your diet as a protective measure against atherosclerosis, diabetes, and cancer. SOURCES OF FIBER  Whole-grain breads and cereals.  Fruits, such as apples, oranges, bananas, berries, prunes, and pears.  Vegetables, such as green peas, carrots, sweet potatoes, beets, broccoli, cabbage, spinach, and artichokes.  Legumes, such split peas, soy, lentils.  Almonds. FIBER CONTENT IN FOODS Starches and Grains / Dietary Fiber (g)  Cheerios, 1 cup / 3 g  Corn Flakes cereal, 1 cup / 0.7 g  Rice crispy treat cereal, 1 cup / 0.3 g  Instant oatmeal (cooked),  cup / 2 g  Frosted wheat cereal, 1 cup / 5.1 g  Brown, long-grain rice (cooked), 1 cup / 3.5 g  White, long-grain rice (cooked), 1 cup / 0.6 g  Enriched macaroni (cooked), 1 cup / 2.5 g Legumes / Dietary Fiber (g)  Baked beans (canned,  plain, or vegetarian),  cup / 5.2 g  Kidney beans (canned),  cup / 6.8 g  Pinto beans (cooked),  cup / 5.5 g Breads and Crackers / Dietary Fiber (g)  Plain or honey graham crackers, 2 squares / 0.7 g  Saltine crackers, 3 squares / 0.3 g  Plain, salted pretzels, 10 pieces / 1.8 g  Whole-wheat bread, 1 slice / 1.9 g  White bread, 1 slice / 0.7 g  Raisin bread, 1 slice / 1.2 g  Plain bagel, 3 oz / 2 g  Flour tortilla, 1 oz / 0.9 g  Corn tortilla, 1 small / 1.5 g  Hamburger or hotdog bun, 1 small / 0.9 g Fruits / Dietary Fiber (g)  Apple with skin, 1 medium / 4.4 g  Sweetened applesauce,  cup / 1.5 g  Banana,  medium / 1.5 g  Grapes, 10 grapes / 0.4 g  Orange, 1 small / 2.3 g  Raisin, 1.5 oz / 1.6 g  Melon, 1 cup / 1.4 g Vegetables / Dietary Fiber (g)  Green beans (canned),  cup / 1.3 g  Carrots (cooked),  cup / 2.3 g  Broccoli (cooked),  cup / 2.8 g  Peas (cooked),  cup / 4.4 g  Mashed potatoes,  cup / 1.6 g  Lettuce, 1 cup / 0.5 g  Corn (canned),  cup / 1.6 g  Tomato,  cup / 1.1 g Document Released: 04/09/2005 Document Revised: 10/09/2011 Document Reviewed: 07/12/2011 [employer] Patient  Information 2015 [place], [place]. This information is not intended to replace advice given to you by your health care provider. Make sure you discuss any questions you have with your health care provider. Colonoscopy, Care After Refer to this sheet in the next few weeks. These instructions provide you with information on caring for yourself after your procedure. Your health care provider may also give you more specific instructions. Your treatment has been planned according to current medical practices, but problems sometimes occur. Call your health care provider if you have any problems or questions after your procedure. WHAT TO EXPECT AFTER THE PROCEDURE  After your procedure, it is typical to have the following:  A small amount of blood in your  stool.  Moderate amounts of gas and mild abdominal cramping or bloating. HOME CARE INSTRUCTIONS  Do not drive, operate machinery, or sign important documents for 24 hours.  You may shower and resume your regular physical activities, but move at a slower pace for the first 24 hours.  Take frequent rest periods for the first 24 hours.  Walk around or put a warm pack on your abdomen to help reduce abdominal cramping and bloating.  Drink enough fluids to keep your urine clear or pale yellow.  You may resume your normal diet as instructed by your health care provider. Avoid heavy or fried foods that are hard to digest.  Avoid drinking alcohol for 24 hours or as instructed by your health care provider.  Only take over-the-counter or prescription medicines as directed by your health care provider.  If a tissue sample (biopsy) was taken during your procedure:  Do not take aspirin or blood thinners for 7 days, or as instructed by your health care provider.  Do not drink alcohol for 7 days, or as instructed by your health care provider.  Eat soft foods for the first 24 hours. SEEK MEDICAL CARE IF: You have persistent spotting of blood in your stool 2-3 days after the procedure. SEEK IMMEDIATE MEDICAL CARE IF:  You have more than a small spotting of blood in your stool.  You pass large blood clots in your stool.  Your abdomen is swollen (distended).  You have nausea or vomiting.  You have a fever.  You have increasing abdominal pain that is not relieved with medicine. Document Released: 11/22/2003 Document Revised: 01/28/2013 Document Reviewed: 12/15/2012 [facility] Patient Information 2015 [place], [place]. This information is not intended to replace advice given to you by your health care provider. Make sure you discuss any questions you have with your health care provider.

## 2014-04-01 NOTE — H&P (Signed)
[name] is an 80 y.o. female.   Chief Complaint: Patient is here for colonoscopy. HPI: Patient is 80 year old Caucasian female who was found to have heme-positive stool when she presented with weakness and noted to be anemic. She denies diarrhea. She has chronic constipation which she takes medication. She has macular degeneration and therefore unable to tell color of stool. She denies abdominal pain nausea vomiting. Heartburn is well controlled with therapy. She is on low-dose aspirin. His last colonoscopy was 4 years ago. Family history significant for colon carcinoma in her father who was either 2 or 80 at the time of diagnosis.  H&H in 03-2014 was 8.7 and 25.6 H&H 2 days ago was 9.2 and 36.9.  Past Medical History  Diagnosis Date  . Hypertension   . Hypothyroidism   . Bradycardia   . Anemia   . Diverticulosis   . External hemorrhoids   . Constipation   . Macular degeneration   . Seasonal allergies   . Osteoporosis   . Degenerative joint disease   . Palpitations   . Hyperlipidemia   . GERD (gastroesophageal reflux disease)   . Pacemaker   . Thyrotoxicosis without mention of goiter or other cause, without mention of thyrotoxic crisis or storm   . Goiter, unspecified     Past Surgical History  Procedure Laterality Date  . Thyroidectomy    . Tonsillectomy    . Colonoscopy  November 2011    External hemorrhoids, diverticulosis, anal papillae  . Insert / replace / remove pacemaker    . Vertebroplasty    . Femur im nail Left 12/29/2012    Procedure: INTRAMEDULLARY (IM) NAIL INTERTROCHANTRIC;  Surgeon: Melina Schools, MD;  Location: [place];  Service: Orthopedics;  Laterality: Left;    Family History  Problem Relation Age of Onset  . Heart attack Mother     Deceased  . Cancer Father     Deceased, colon cancer age 71  . Cancer Brother     Deceased, throat and lung   Social History:  reports that she has never smoked. She has never used smokeless tobacco. She reports  that she does not drink alcohol or use illicit drugs.  Allergies:  Allergies  Allergen Reactions  . Tramadol Itching and Nausea Only  . Sulfa Antibiotics Nausea And Vomiting    Medications Prior to Admission  Medication Sig Dispense Refill  . acetaminophen (TYLENOL) 325 MG tablet Take 650 mg by mouth every 6 (six) hours as needed for moderate pain or headache.     Marland Kitchen amLODipine (NORVASC) 10 MG tablet Take 0.5 tablets (5 mg total) by mouth daily. 15 tablet 2  . aspirin 325 MG EC tablet Take 325 mg by mouth daily with breakfast.    . beta carotene w/minerals (OCUVITE) tablet Take 1 tablet by mouth 2 (two) times daily.    . bisacodyl (DULCOLAX) 5 MG EC tablet Take 2 tablets (10 mg total) by mouth daily as needed for moderate constipation. Take at 2pm day before procedure 1 tablet 0  . cetirizine (ZYRTEC) 10 MG tablet Take 10 mg by mouth at bedtime.    . cycloSPORINE (RESTASIS) 0.05 % ophthalmic emulsion Place 1 drop into both eyes 2 (two) times daily.     Marland Kitchen docusate sodium (COLACE) 100 MG capsule Take 100 mg by mouth at bedtime.    . fluticasone (FLONASE) 50 MCG/ACT nasal spray Place 2 sprays into the nose daily. For allergies    . hydrALAZINE (APRESOLINE) 25 MG tablet  Take 25 mg by mouth 2 (two) times daily.      . Iron-FA-B Cmp-C-Biot-Probiotic (FUSION PLUS PO) Take 1 capsule by mouth daily.    Marland Kitchen levothyroxine (SYNTHROID, LEVOTHROID) 112 MCG tablet Take 112 mcg by mouth daily before breakfast.    . lisinopril (PRINIVIL,ZESTRIL) 20 MG tablet Take 20 mg by mouth 2 (two) times daily.     Marland Kitchen loperamide (IMODIUM) 2 MG capsule Take 2 mg by mouth every 4 (four) hours as needed for diarrhea or loose stools.    . Magnesium 250 MG TABS Take 1 tablet by mouth every evening.    . Melatonin 1 MG TABS Take 1 tablet by mouth at bedtime.    . Multiple Vitamins-Minerals (THERATRUM COMPLETE PO) Take 1 tablet by mouth 2 (two) times daily.    Marland Kitchen omeprazole (PRILOSEC) 20 MG capsule Take 20 mg by mouth every  morning.     . polyethylene glycol powder (GLYCOLAX/MIRALAX) powder Take 17 g by mouth daily. For constipation. *One capful to be mixed in water or juice*    . polyethylene glycol-electrolytes (TRILYTE) 420 G solution Take 4,000 mLs by mouth once. 4000 mL 0  . sodium phosphate (FLEET) 7-19 GM/118ML ENEM Place 1 enema rectally every other day as needed (if patient has no BM for 2 days).  0  . Wheat Dextrin (BENEFIBER DRINK MIX PO) Take by mouth daily. Mix 2 teaspoonfuls with water water and drink daily.      No results found for this or any previous visit (from the past 48 hour(s)). No results found.  ROS  Blood pressure 139/71, pulse 66, temperature 98.3 F (36.8 C), temperature source Oral, resp. rate 12, SpO2 98 %. Physical Exam  Constitutional: She appears well-developed and well-nourished.  HENT:  Mouth/Throat: Oropharynx is clear and moist.  Eyes: Conjunctivae are normal. No scleral icterus.  Neck: No thyromegaly present.  Cardiovascular: Normal rate, regular rhythm and normal heart sounds.   No murmur heard. Respiratory: Effort normal and breath sounds normal.  GI: Soft. She exhibits no distension and no mass. There is no tenderness.  Musculoskeletal: She exhibits no edema.  Lymphadenopathy:    She has no cervical adenopathy.  Neurological: She is alert.  Skin: Skin is warm and dry.     Assessment/Plan Heme positive stool and anemia. Family history of colon carcinoma in father at late onset.  Akul Leggette U 04/01/2014, 9:20 AM

## 2014-04-02 ENCOUNTER — Encounter (HOSPITAL_COMMUNITY): Payer: Self-pay | Admitting: Internal Medicine

## 2014-04-02 DIAGNOSIS — D500 Iron deficiency anemia secondary to blood loss (chronic): Secondary | ICD-10-CM | POA: Diagnosis not present

## 2014-04-02 DIAGNOSIS — Z950 Presence of cardiac pacemaker: Secondary | ICD-10-CM | POA: Diagnosis not present

## 2014-04-02 DIAGNOSIS — I10 Essential (primary) hypertension: Secondary | ICD-10-CM | POA: Diagnosis not present

## 2014-04-02 DIAGNOSIS — J069 Acute upper respiratory infection, unspecified: Secondary | ICD-10-CM | POA: Diagnosis not present

## 2014-04-02 DIAGNOSIS — R2689 Other abnormalities of gait and mobility: Secondary | ICD-10-CM | POA: Diagnosis not present

## 2014-04-02 DIAGNOSIS — M6281 Muscle weakness (generalized): Secondary | ICD-10-CM | POA: Diagnosis not present

## 2014-04-02 LAB — MDC_IDC_ENUM_SESS_TYPE_REMOTE
Brady Statistic AP VP Percent: 1 %
Brady Statistic AP VS Percent: 26 %
Brady Statistic AS VP Percent: 1 %
Brady Statistic AS VS Percent: 73 %
Brady Statistic RA Percent Paced: 26 %
Brady Statistic RV Percent Paced: 1 %
Implantable Pulse Generator Model: 2210
Implantable Pulse Generator Serial Number: 7316836
Lead Channel Impedance Value: 390 Ohm
Lead Channel Impedance Value: 410 Ohm
Lead Channel Pacing Threshold Amplitude: 1 V
Lead Channel Pacing Threshold Pulse Width: 0.5 ms
Lead Channel Pacing Threshold Pulse Width: 0.5 ms
Lead Channel Sensing Intrinsic Amplitude: 12 mV
Lead Channel Setting Pacing Amplitude: 2 V
Lead Channel Setting Pacing Pulse Width: 0.5 ms
MDC IDC MSMT BATTERY REMAINING LONGEVITY: 93 mo
MDC IDC MSMT BATTERY REMAINING PERCENTAGE: 78 %
MDC IDC MSMT BATTERY VOLTAGE: 2.96 V
MDC IDC MSMT LEADCHNL RA PACING THRESHOLD AMPLITUDE: 0.75 V
MDC IDC MSMT LEADCHNL RA SENSING INTR AMPL: 3 mV
MDC IDC SESS DTM: 20151202082007
MDC IDC SET LEADCHNL RV PACING AMPLITUDE: 2.5 V
MDC IDC SET LEADCHNL RV SENSING SENSITIVITY: 2 mV

## 2014-04-03 DIAGNOSIS — J069 Acute upper respiratory infection, unspecified: Secondary | ICD-10-CM | POA: Diagnosis not present

## 2014-04-03 DIAGNOSIS — R2689 Other abnormalities of gait and mobility: Secondary | ICD-10-CM | POA: Diagnosis not present

## 2014-04-03 DIAGNOSIS — Z950 Presence of cardiac pacemaker: Secondary | ICD-10-CM | POA: Diagnosis not present

## 2014-04-03 DIAGNOSIS — M6281 Muscle weakness (generalized): Secondary | ICD-10-CM | POA: Diagnosis not present

## 2014-04-03 DIAGNOSIS — I10 Essential (primary) hypertension: Secondary | ICD-10-CM | POA: Diagnosis not present

## 2014-04-03 DIAGNOSIS — D500 Iron deficiency anemia secondary to blood loss (chronic): Secondary | ICD-10-CM | POA: Diagnosis not present

## 2014-04-05 DIAGNOSIS — I10 Essential (primary) hypertension: Secondary | ICD-10-CM | POA: Diagnosis not present

## 2014-04-05 DIAGNOSIS — Z950 Presence of cardiac pacemaker: Secondary | ICD-10-CM | POA: Diagnosis not present

## 2014-04-05 DIAGNOSIS — R2689 Other abnormalities of gait and mobility: Secondary | ICD-10-CM | POA: Diagnosis not present

## 2014-04-05 DIAGNOSIS — D500 Iron deficiency anemia secondary to blood loss (chronic): Secondary | ICD-10-CM | POA: Diagnosis not present

## 2014-04-05 DIAGNOSIS — J069 Acute upper respiratory infection, unspecified: Secondary | ICD-10-CM | POA: Diagnosis not present

## 2014-04-05 DIAGNOSIS — M6281 Muscle weakness (generalized): Secondary | ICD-10-CM | POA: Diagnosis not present

## 2014-04-06 DIAGNOSIS — Z950 Presence of cardiac pacemaker: Secondary | ICD-10-CM | POA: Diagnosis not present

## 2014-04-06 DIAGNOSIS — M6281 Muscle weakness (generalized): Secondary | ICD-10-CM | POA: Diagnosis not present

## 2014-04-06 DIAGNOSIS — I10 Essential (primary) hypertension: Secondary | ICD-10-CM | POA: Diagnosis not present

## 2014-04-06 DIAGNOSIS — D509 Iron deficiency anemia, unspecified: Secondary | ICD-10-CM | POA: Diagnosis not present

## 2014-04-06 DIAGNOSIS — R2689 Other abnormalities of gait and mobility: Secondary | ICD-10-CM | POA: Diagnosis not present

## 2014-04-06 DIAGNOSIS — K649 Unspecified hemorrhoids: Secondary | ICD-10-CM | POA: Diagnosis not present

## 2014-04-06 DIAGNOSIS — J069 Acute upper respiratory infection, unspecified: Secondary | ICD-10-CM | POA: Diagnosis not present

## 2014-04-06 DIAGNOSIS — D500 Iron deficiency anemia secondary to blood loss (chronic): Secondary | ICD-10-CM | POA: Diagnosis not present

## 2014-04-06 DIAGNOSIS — R05 Cough: Secondary | ICD-10-CM | POA: Diagnosis not present

## 2014-04-07 DIAGNOSIS — D500 Iron deficiency anemia secondary to blood loss (chronic): Secondary | ICD-10-CM | POA: Diagnosis not present

## 2014-04-07 DIAGNOSIS — Z950 Presence of cardiac pacemaker: Secondary | ICD-10-CM | POA: Diagnosis not present

## 2014-04-07 DIAGNOSIS — R2689 Other abnormalities of gait and mobility: Secondary | ICD-10-CM | POA: Diagnosis not present

## 2014-04-07 DIAGNOSIS — J069 Acute upper respiratory infection, unspecified: Secondary | ICD-10-CM | POA: Diagnosis not present

## 2014-04-07 DIAGNOSIS — M6281 Muscle weakness (generalized): Secondary | ICD-10-CM | POA: Diagnosis not present

## 2014-04-07 DIAGNOSIS — I10 Essential (primary) hypertension: Secondary | ICD-10-CM | POA: Diagnosis not present

## 2014-04-08 DIAGNOSIS — M6281 Muscle weakness (generalized): Secondary | ICD-10-CM | POA: Diagnosis not present

## 2014-04-08 DIAGNOSIS — Z950 Presence of cardiac pacemaker: Secondary | ICD-10-CM | POA: Diagnosis not present

## 2014-04-08 DIAGNOSIS — D500 Iron deficiency anemia secondary to blood loss (chronic): Secondary | ICD-10-CM | POA: Diagnosis not present

## 2014-04-08 DIAGNOSIS — I10 Essential (primary) hypertension: Secondary | ICD-10-CM | POA: Diagnosis not present

## 2014-04-08 DIAGNOSIS — R2689 Other abnormalities of gait and mobility: Secondary | ICD-10-CM | POA: Diagnosis not present

## 2014-04-08 DIAGNOSIS — J069 Acute upper respiratory infection, unspecified: Secondary | ICD-10-CM | POA: Diagnosis not present

## 2014-04-09 ENCOUNTER — Encounter: Payer: Self-pay | Admitting: Cardiology

## 2014-04-09 DIAGNOSIS — M6281 Muscle weakness (generalized): Secondary | ICD-10-CM | POA: Diagnosis not present

## 2014-04-09 DIAGNOSIS — R2689 Other abnormalities of gait and mobility: Secondary | ICD-10-CM | POA: Diagnosis not present

## 2014-04-09 DIAGNOSIS — D500 Iron deficiency anemia secondary to blood loss (chronic): Secondary | ICD-10-CM | POA: Diagnosis not present

## 2014-04-09 DIAGNOSIS — I10 Essential (primary) hypertension: Secondary | ICD-10-CM | POA: Diagnosis not present

## 2014-04-09 DIAGNOSIS — Z950 Presence of cardiac pacemaker: Secondary | ICD-10-CM | POA: Diagnosis not present

## 2014-04-09 DIAGNOSIS — J069 Acute upper respiratory infection, unspecified: Secondary | ICD-10-CM | POA: Diagnosis not present

## 2014-04-12 DIAGNOSIS — R2689 Other abnormalities of gait and mobility: Secondary | ICD-10-CM | POA: Diagnosis not present

## 2014-04-12 DIAGNOSIS — M6281 Muscle weakness (generalized): Secondary | ICD-10-CM | POA: Diagnosis not present

## 2014-04-12 DIAGNOSIS — J069 Acute upper respiratory infection, unspecified: Secondary | ICD-10-CM | POA: Diagnosis not present

## 2014-04-12 DIAGNOSIS — I10 Essential (primary) hypertension: Secondary | ICD-10-CM | POA: Diagnosis not present

## 2014-04-12 DIAGNOSIS — D500 Iron deficiency anemia secondary to blood loss (chronic): Secondary | ICD-10-CM | POA: Diagnosis not present

## 2014-04-12 DIAGNOSIS — Z950 Presence of cardiac pacemaker: Secondary | ICD-10-CM | POA: Diagnosis not present

## 2014-04-13 DIAGNOSIS — D500 Iron deficiency anemia secondary to blood loss (chronic): Secondary | ICD-10-CM | POA: Diagnosis not present

## 2014-04-13 DIAGNOSIS — M6281 Muscle weakness (generalized): Secondary | ICD-10-CM | POA: Diagnosis not present

## 2014-04-13 DIAGNOSIS — R2689 Other abnormalities of gait and mobility: Secondary | ICD-10-CM | POA: Diagnosis not present

## 2014-04-13 DIAGNOSIS — I10 Essential (primary) hypertension: Secondary | ICD-10-CM | POA: Diagnosis not present

## 2014-04-13 DIAGNOSIS — Z950 Presence of cardiac pacemaker: Secondary | ICD-10-CM | POA: Diagnosis not present

## 2014-04-13 DIAGNOSIS — J069 Acute upper respiratory infection, unspecified: Secondary | ICD-10-CM | POA: Diagnosis not present

## 2014-04-14 DIAGNOSIS — R2689 Other abnormalities of gait and mobility: Secondary | ICD-10-CM | POA: Diagnosis not present

## 2014-04-14 DIAGNOSIS — J069 Acute upper respiratory infection, unspecified: Secondary | ICD-10-CM | POA: Diagnosis not present

## 2014-04-14 DIAGNOSIS — M6281 Muscle weakness (generalized): Secondary | ICD-10-CM | POA: Diagnosis not present

## 2014-04-14 DIAGNOSIS — D500 Iron deficiency anemia secondary to blood loss (chronic): Secondary | ICD-10-CM | POA: Diagnosis not present

## 2014-04-14 DIAGNOSIS — Z950 Presence of cardiac pacemaker: Secondary | ICD-10-CM | POA: Diagnosis not present

## 2014-04-14 DIAGNOSIS — I10 Essential (primary) hypertension: Secondary | ICD-10-CM | POA: Diagnosis not present

## 2014-04-15 DIAGNOSIS — Z950 Presence of cardiac pacemaker: Secondary | ICD-10-CM | POA: Diagnosis not present

## 2014-04-15 DIAGNOSIS — R2689 Other abnormalities of gait and mobility: Secondary | ICD-10-CM | POA: Diagnosis not present

## 2014-04-15 DIAGNOSIS — J069 Acute upper respiratory infection, unspecified: Secondary | ICD-10-CM | POA: Diagnosis not present

## 2014-04-15 DIAGNOSIS — D500 Iron deficiency anemia secondary to blood loss (chronic): Secondary | ICD-10-CM | POA: Diagnosis not present

## 2014-04-15 DIAGNOSIS — I10 Essential (primary) hypertension: Secondary | ICD-10-CM | POA: Diagnosis not present

## 2014-04-15 DIAGNOSIS — M6281 Muscle weakness (generalized): Secondary | ICD-10-CM | POA: Diagnosis not present

## 2014-04-19 ENCOUNTER — Encounter (INDEPENDENT_AMBULATORY_CARE_PROVIDER_SITE_OTHER): Payer: Self-pay

## 2014-04-19 ENCOUNTER — Telehealth (INDEPENDENT_AMBULATORY_CARE_PROVIDER_SITE_OTHER): Payer: Self-pay | Admitting: *Deleted

## 2014-04-19 DIAGNOSIS — D509 Iron deficiency anemia, unspecified: Secondary | ICD-10-CM

## 2014-04-19 DIAGNOSIS — R531 Weakness: Secondary | ICD-10-CM

## 2014-04-19 DIAGNOSIS — I10 Essential (primary) hypertension: Secondary | ICD-10-CM | POA: Diagnosis not present

## 2014-04-19 DIAGNOSIS — K625 Hemorrhage of anus and rectum: Secondary | ICD-10-CM | POA: Diagnosis not present

## 2014-04-19 DIAGNOSIS — J069 Acute upper respiratory infection, unspecified: Secondary | ICD-10-CM | POA: Diagnosis not present

## 2014-04-19 DIAGNOSIS — D649 Anemia, unspecified: Secondary | ICD-10-CM

## 2014-04-19 DIAGNOSIS — Z950 Presence of cardiac pacemaker: Secondary | ICD-10-CM | POA: Diagnosis not present

## 2014-04-19 DIAGNOSIS — D500 Iron deficiency anemia secondary to blood loss (chronic): Secondary | ICD-10-CM | POA: Diagnosis not present

## 2014-04-19 DIAGNOSIS — R2689 Other abnormalities of gait and mobility: Secondary | ICD-10-CM | POA: Diagnosis not present

## 2014-04-19 DIAGNOSIS — M6281 Muscle weakness (generalized): Secondary | ICD-10-CM | POA: Diagnosis not present

## 2014-04-19 DIAGNOSIS — R109 Unspecified abdominal pain: Secondary | ICD-10-CM

## 2014-04-19 LAB — CBC WITH DIFFERENTIAL/PLATELET
Basophils Absolute: 0.1 10*3/uL (ref 0.0–0.1)
Basophils Relative: 1 % (ref 0–1)
EOS ABS: 0.1 10*3/uL (ref 0.0–0.7)
EOS PCT: 2 % (ref 0–5)
HCT: 31.6 % — ABNORMAL LOW (ref 36.0–46.0)
HEMOGLOBIN: 10.4 g/dL — AB (ref 12.0–15.0)
LYMPHS ABS: 1.7 10*3/uL (ref 0.7–4.0)
Lymphocytes Relative: 32 % (ref 12–46)
MCH: 30.8 pg (ref 26.0–34.0)
MCHC: 32.9 g/dL (ref 30.0–36.0)
MCV: 93.5 fL (ref 78.0–100.0)
MONO ABS: 0.6 10*3/uL (ref 0.1–1.0)
MPV: 8.9 fL — ABNORMAL LOW (ref 9.4–12.4)
Monocytes Relative: 11 % (ref 3–12)
Neutro Abs: 2.9 10*3/uL (ref 1.7–7.7)
Neutrophils Relative %: 54 % (ref 43–77)
Platelets: 419 10*3/uL — ABNORMAL HIGH (ref 150–400)
RBC: 3.38 MIL/uL — ABNORMAL LOW (ref 3.87–5.11)
RDW: 17.1 % — ABNORMAL HIGH (ref 11.5–15.5)
WBC: 5.4 10*3/uL (ref 4.0–10.5)

## 2014-04-19 LAB — IRON AND TIBC
%SAT: 22 % (ref 20–55)
Iron: 44 ug/dL (ref 42–145)
TIBC: 202 ug/dL — AB (ref 250–470)
UIBC: 158 ug/dL (ref 125–400)

## 2014-04-19 NOTE — Telephone Encounter (Signed)
Per Dr.Rehman the patient will need to have labs drawn 04/19/14.

## 2014-04-20 ENCOUNTER — Telehealth (INDEPENDENT_AMBULATORY_CARE_PROVIDER_SITE_OTHER): Payer: Self-pay | Admitting: *Deleted

## 2014-04-20 DIAGNOSIS — M9901 Segmental and somatic dysfunction of cervical region: Secondary | ICD-10-CM | POA: Diagnosis not present

## 2014-04-20 DIAGNOSIS — M6281 Muscle weakness (generalized): Secondary | ICD-10-CM | POA: Diagnosis not present

## 2014-04-20 DIAGNOSIS — Z950 Presence of cardiac pacemaker: Secondary | ICD-10-CM | POA: Diagnosis not present

## 2014-04-20 DIAGNOSIS — M47812 Spondylosis without myelopathy or radiculopathy, cervical region: Secondary | ICD-10-CM | POA: Diagnosis not present

## 2014-04-20 DIAGNOSIS — D500 Iron deficiency anemia secondary to blood loss (chronic): Secondary | ICD-10-CM | POA: Diagnosis not present

## 2014-04-20 DIAGNOSIS — R2689 Other abnormalities of gait and mobility: Secondary | ICD-10-CM | POA: Diagnosis not present

## 2014-04-20 DIAGNOSIS — J069 Acute upper respiratory infection, unspecified: Secondary | ICD-10-CM | POA: Diagnosis not present

## 2014-04-20 DIAGNOSIS — I10 Essential (primary) hypertension: Secondary | ICD-10-CM | POA: Diagnosis not present

## 2014-04-20 LAB — FERRITIN: FERRITIN: 184 ng/mL (ref 10–291)

## 2014-04-20 NOTE — Telephone Encounter (Signed)
Would like to get the lab results from 04/19/14. Her return phone number is [phone].

## 2014-04-20 NOTE — Telephone Encounter (Signed)
No answer at home.  Please call tomorrow and give her results of her CBC

## 2014-04-21 NOTE — Telephone Encounter (Signed)
Patient was called and given results of her labs. Hgb is coming up. 03/30/14 it was 9.2, 03/24/14 it was 8.7. 04/19/14 it resulted at 10.4. Ferritin - 184 on 04/19/14. On 12/29/09 this was 62. Iron -44 on 04/19/14. On 12/29/13 it was 50 TIBC 22 on 04/19/14. On 01/18/14 it was 239.  Deberah Castle NP-C reviewed results and states that results are good and ask that I call patient with the results. Patient was called and given results,made her aware that Dr.Rehman would review and I would call her back with his recommendation. She states that she is weak, when she tries to walk,she feels that she has to stop and set down.

## 2014-04-22 ENCOUNTER — Encounter (INDEPENDENT_AMBULATORY_CARE_PROVIDER_SITE_OTHER): Payer: Self-pay

## 2014-04-22 DIAGNOSIS — M6281 Muscle weakness (generalized): Secondary | ICD-10-CM | POA: Diagnosis not present

## 2014-04-22 DIAGNOSIS — J069 Acute upper respiratory infection, unspecified: Secondary | ICD-10-CM | POA: Diagnosis not present

## 2014-04-22 DIAGNOSIS — Z950 Presence of cardiac pacemaker: Secondary | ICD-10-CM | POA: Diagnosis not present

## 2014-04-22 DIAGNOSIS — I10 Essential (primary) hypertension: Secondary | ICD-10-CM | POA: Diagnosis not present

## 2014-04-22 DIAGNOSIS — D500 Iron deficiency anemia secondary to blood loss (chronic): Secondary | ICD-10-CM | POA: Diagnosis not present

## 2014-04-22 DIAGNOSIS — R2689 Other abnormalities of gait and mobility: Secondary | ICD-10-CM | POA: Diagnosis not present

## 2014-04-27 DIAGNOSIS — Z950 Presence of cardiac pacemaker: Secondary | ICD-10-CM | POA: Diagnosis not present

## 2014-04-27 DIAGNOSIS — J069 Acute upper respiratory infection, unspecified: Secondary | ICD-10-CM | POA: Diagnosis not present

## 2014-04-27 DIAGNOSIS — D500 Iron deficiency anemia secondary to blood loss (chronic): Secondary | ICD-10-CM | POA: Diagnosis not present

## 2014-04-27 DIAGNOSIS — I10 Essential (primary) hypertension: Secondary | ICD-10-CM | POA: Diagnosis not present

## 2014-04-27 DIAGNOSIS — R2689 Other abnormalities of gait and mobility: Secondary | ICD-10-CM | POA: Diagnosis not present

## 2014-04-27 DIAGNOSIS — M6281 Muscle weakness (generalized): Secondary | ICD-10-CM | POA: Diagnosis not present

## 2014-04-29 DIAGNOSIS — J069 Acute upper respiratory infection, unspecified: Secondary | ICD-10-CM | POA: Diagnosis not present

## 2014-04-29 DIAGNOSIS — I10 Essential (primary) hypertension: Secondary | ICD-10-CM | POA: Diagnosis not present

## 2014-04-29 DIAGNOSIS — Z950 Presence of cardiac pacemaker: Secondary | ICD-10-CM | POA: Diagnosis not present

## 2014-04-29 DIAGNOSIS — D500 Iron deficiency anemia secondary to blood loss (chronic): Secondary | ICD-10-CM | POA: Diagnosis not present

## 2014-04-29 DIAGNOSIS — M6281 Muscle weakness (generalized): Secondary | ICD-10-CM | POA: Diagnosis not present

## 2014-04-29 DIAGNOSIS — R2689 Other abnormalities of gait and mobility: Secondary | ICD-10-CM | POA: Diagnosis not present

## 2014-05-03 ENCOUNTER — Telehealth (INDEPENDENT_AMBULATORY_CARE_PROVIDER_SITE_OTHER): Payer: Self-pay | Admitting: *Deleted

## 2014-05-03 DIAGNOSIS — D509 Iron deficiency anemia, unspecified: Secondary | ICD-10-CM

## 2014-05-03 DIAGNOSIS — K625 Hemorrhage of anus and rectum: Secondary | ICD-10-CM

## 2014-05-03 NOTE — Telephone Encounter (Signed)
Labs are noted for 06/03/14. Hemoccult Card will be mail to patient. Report results to [facility]. Lelon Frohlich)

## 2014-05-03 NOTE — Telephone Encounter (Signed)
Per Dr.Rehman the patient will need to have labs drawn in 1 month and she will need to have hemoccult cards at this time. Per Dr. Laural Golden mail them to her.

## 2014-05-18 DIAGNOSIS — M47812 Spondylosis without myelopathy or radiculopathy, cervical region: Secondary | ICD-10-CM | POA: Diagnosis not present

## 2014-05-18 DIAGNOSIS — M9901 Segmental and somatic dysfunction of cervical region: Secondary | ICD-10-CM | POA: Diagnosis not present

## 2014-05-20 DIAGNOSIS — M1712 Unilateral primary osteoarthritis, left knee: Secondary | ICD-10-CM | POA: Diagnosis not present

## 2014-05-24 DIAGNOSIS — M79671 Pain in right foot: Secondary | ICD-10-CM | POA: Diagnosis not present

## 2014-05-24 DIAGNOSIS — B351 Tinea unguium: Secondary | ICD-10-CM | POA: Diagnosis not present

## 2014-05-24 DIAGNOSIS — L600 Ingrowing nail: Secondary | ICD-10-CM | POA: Diagnosis not present

## 2014-05-24 DIAGNOSIS — L609 Nail disorder, unspecified: Secondary | ICD-10-CM | POA: Diagnosis not present

## 2014-05-25 ENCOUNTER — Other Ambulatory Visit (INDEPENDENT_AMBULATORY_CARE_PROVIDER_SITE_OTHER): Payer: Self-pay | Admitting: *Deleted

## 2014-05-25 ENCOUNTER — Encounter (INDEPENDENT_AMBULATORY_CARE_PROVIDER_SITE_OTHER): Payer: Self-pay | Admitting: *Deleted

## 2014-05-25 DIAGNOSIS — K625 Hemorrhage of anus and rectum: Secondary | ICD-10-CM

## 2014-05-25 DIAGNOSIS — D509 Iron deficiency anemia, unspecified: Secondary | ICD-10-CM

## 2014-05-26 DIAGNOSIS — M1712 Unilateral primary osteoarthritis, left knee: Secondary | ICD-10-CM | POA: Diagnosis not present

## 2014-06-03 ENCOUNTER — Telehealth (INDEPENDENT_AMBULATORY_CARE_PROVIDER_SITE_OTHER): Payer: Self-pay | Admitting: *Deleted

## 2014-06-03 DIAGNOSIS — K625 Hemorrhage of anus and rectum: Secondary | ICD-10-CM | POA: Diagnosis not present

## 2014-06-03 DIAGNOSIS — D509 Iron deficiency anemia, unspecified: Secondary | ICD-10-CM | POA: Diagnosis not present

## 2014-06-03 DIAGNOSIS — M1712 Unilateral primary osteoarthritis, left knee: Secondary | ICD-10-CM | POA: Diagnosis not present

## 2014-06-03 LAB — CBC
HCT: 28.6 % — ABNORMAL LOW (ref 36.0–46.0)
Hemoglobin: 9.6 g/dL — ABNORMAL LOW (ref 12.0–15.0)
MCH: 31.3 pg (ref 26.0–34.0)
MCHC: 33.6 g/dL (ref 30.0–36.0)
MCV: 93.2 fL (ref 78.0–100.0)
MPV: 9.1 fL (ref 8.6–12.4)
PLATELETS: 372 10*3/uL (ref 150–400)
RBC: 3.07 MIL/uL — AB (ref 3.87–5.11)
RDW: 17.2 % — AB (ref 11.5–15.5)
WBC: 5.3 10*3/uL (ref 4.0–10.5)

## 2014-06-03 NOTE — Telephone Encounter (Signed)
   Diagnosis:    Result(s)   Card 1: Negative:            Completed by: Markasia Carrol,LPN   HEMOCCULT SENSA DEVELOPER: LOT#:  [id number] EXPIRATION DATE: 2016-05   HEMOCCULT SENSA CARD:  LOT#:  02/14 EXPIRATION DATE: 07/18   CARD CONTROL RESULTS:  POSITIVE:  NEGATIVE:     ADDITIONAL COMMENTS: Patient was made aware of her Hemoccult result. Lab (Blood) results to follow.

## 2014-06-04 NOTE — Telephone Encounter (Signed)
Stool guaiac is negative

## 2014-06-08 ENCOUNTER — Telehealth (INDEPENDENT_AMBULATORY_CARE_PROVIDER_SITE_OTHER): Payer: Self-pay | Admitting: *Deleted

## 2014-06-08 DIAGNOSIS — D508 Other iron deficiency anemias: Secondary | ICD-10-CM

## 2014-06-08 NOTE — Telephone Encounter (Signed)
Stool is quite negative.

## 2014-06-08 NOTE — Telephone Encounter (Signed)
Per Dr.Rehman the patient will need to have labs drawn in 1 month. 

## 2014-06-10 ENCOUNTER — Encounter (INDEPENDENT_AMBULATORY_CARE_PROVIDER_SITE_OTHER): Payer: Self-pay | Admitting: *Deleted

## 2014-06-10 ENCOUNTER — Other Ambulatory Visit (INDEPENDENT_AMBULATORY_CARE_PROVIDER_SITE_OTHER): Payer: Self-pay | Admitting: *Deleted

## 2014-06-10 DIAGNOSIS — D508 Other iron deficiency anemias: Secondary | ICD-10-CM

## 2014-06-14 DIAGNOSIS — H00024 Hordeolum internum left upper eyelid: Secondary | ICD-10-CM | POA: Diagnosis not present

## 2014-07-06 DIAGNOSIS — H672 Otitis media in diseases classified elsewhere, left ear: Secondary | ICD-10-CM | POA: Diagnosis not present

## 2014-07-06 DIAGNOSIS — H6122 Impacted cerumen, left ear: Secondary | ICD-10-CM | POA: Diagnosis not present

## 2014-07-06 DIAGNOSIS — Z6825 Body mass index (BMI) 25.0-25.9, adult: Secondary | ICD-10-CM | POA: Diagnosis not present

## 2014-07-07 DIAGNOSIS — D508 Other iron deficiency anemias: Secondary | ICD-10-CM | POA: Diagnosis not present

## 2014-07-08 LAB — CBC
HCT: 29.8 % — ABNORMAL LOW (ref 36.0–46.0)
Hemoglobin: 9.9 g/dL — ABNORMAL LOW (ref 12.0–15.0)
MCH: 32 pg (ref 26.0–34.0)
MCHC: 33.2 g/dL (ref 30.0–36.0)
MCV: 96.4 fL (ref 78.0–100.0)
MPV: 9.2 fL (ref 8.6–12.4)
PLATELETS: 373 10*3/uL (ref 150–400)
RBC: 3.09 MIL/uL — AB (ref 3.87–5.11)
RDW: 17.4 % — AB (ref 11.5–15.5)
WBC: 5.2 10*3/uL (ref 4.0–10.5)

## 2014-07-13 ENCOUNTER — Telehealth (INDEPENDENT_AMBULATORY_CARE_PROVIDER_SITE_OTHER): Payer: Self-pay | Admitting: *Deleted

## 2014-07-13 DIAGNOSIS — D509 Iron deficiency anemia, unspecified: Secondary | ICD-10-CM

## 2014-07-13 NOTE — Telephone Encounter (Signed)
Per Dr.Rehman the patient will need to have labs drawn in 3 months 

## 2014-08-04 ENCOUNTER — Encounter: Payer: Medicare Other | Admitting: Internal Medicine

## 2014-08-16 DIAGNOSIS — D509 Iron deficiency anemia, unspecified: Secondary | ICD-10-CM | POA: Diagnosis not present

## 2014-08-16 DIAGNOSIS — E039 Hypothyroidism, unspecified: Secondary | ICD-10-CM | POA: Diagnosis not present

## 2014-08-16 DIAGNOSIS — I10 Essential (primary) hypertension: Secondary | ICD-10-CM | POA: Diagnosis not present

## 2014-08-23 DIAGNOSIS — Z8781 Personal history of (healed) traumatic fracture: Secondary | ICD-10-CM | POA: Diagnosis not present

## 2014-08-23 DIAGNOSIS — M6281 Muscle weakness (generalized): Secondary | ICD-10-CM | POA: Diagnosis not present

## 2014-08-23 DIAGNOSIS — M1712 Unilateral primary osteoarthritis, left knee: Secondary | ICD-10-CM | POA: Diagnosis not present

## 2014-08-23 DIAGNOSIS — I10 Essential (primary) hypertension: Secondary | ICD-10-CM | POA: Diagnosis not present

## 2014-08-23 DIAGNOSIS — D500 Iron deficiency anemia secondary to blood loss (chronic): Secondary | ICD-10-CM | POA: Diagnosis not present

## 2014-08-23 DIAGNOSIS — D509 Iron deficiency anemia, unspecified: Secondary | ICD-10-CM | POA: Diagnosis not present

## 2014-08-23 DIAGNOSIS — R262 Difficulty in walking, not elsewhere classified: Secondary | ICD-10-CM | POA: Diagnosis not present

## 2014-08-25 DIAGNOSIS — R262 Difficulty in walking, not elsewhere classified: Secondary | ICD-10-CM | POA: Diagnosis not present

## 2014-08-25 DIAGNOSIS — M1712 Unilateral primary osteoarthritis, left knee: Secondary | ICD-10-CM | POA: Diagnosis not present

## 2014-08-25 DIAGNOSIS — M6281 Muscle weakness (generalized): Secondary | ICD-10-CM | POA: Diagnosis not present

## 2014-08-25 DIAGNOSIS — Z8781 Personal history of (healed) traumatic fracture: Secondary | ICD-10-CM | POA: Diagnosis not present

## 2014-08-26 DIAGNOSIS — M1712 Unilateral primary osteoarthritis, left knee: Secondary | ICD-10-CM | POA: Diagnosis not present

## 2014-08-26 DIAGNOSIS — R262 Difficulty in walking, not elsewhere classified: Secondary | ICD-10-CM | POA: Diagnosis not present

## 2014-08-26 DIAGNOSIS — M6281 Muscle weakness (generalized): Secondary | ICD-10-CM | POA: Diagnosis not present

## 2014-08-26 DIAGNOSIS — Z8781 Personal history of (healed) traumatic fracture: Secondary | ICD-10-CM | POA: Diagnosis not present

## 2014-08-30 DIAGNOSIS — Z8781 Personal history of (healed) traumatic fracture: Secondary | ICD-10-CM | POA: Diagnosis not present

## 2014-08-30 DIAGNOSIS — R262 Difficulty in walking, not elsewhere classified: Secondary | ICD-10-CM | POA: Diagnosis not present

## 2014-08-30 DIAGNOSIS — M1712 Unilateral primary osteoarthritis, left knee: Secondary | ICD-10-CM | POA: Diagnosis not present

## 2014-08-30 DIAGNOSIS — M6281 Muscle weakness (generalized): Secondary | ICD-10-CM | POA: Diagnosis not present

## 2014-08-31 ENCOUNTER — Encounter: Payer: Self-pay | Admitting: Internal Medicine

## 2014-08-31 ENCOUNTER — Ambulatory Visit (INDEPENDENT_AMBULATORY_CARE_PROVIDER_SITE_OTHER): Payer: Medicare Other | Admitting: Internal Medicine

## 2014-08-31 VITALS — BP 142/60 | HR 77 | Ht 65.5 in | Wt 144.6 lb

## 2014-08-31 DIAGNOSIS — R001 Bradycardia, unspecified: Secondary | ICD-10-CM

## 2014-08-31 DIAGNOSIS — R002 Palpitations: Secondary | ICD-10-CM | POA: Diagnosis not present

## 2014-08-31 DIAGNOSIS — I10 Essential (primary) hypertension: Secondary | ICD-10-CM | POA: Diagnosis not present

## 2014-08-31 DIAGNOSIS — M1712 Unilateral primary osteoarthritis, left knee: Secondary | ICD-10-CM | POA: Diagnosis not present

## 2014-08-31 DIAGNOSIS — Z950 Presence of cardiac pacemaker: Secondary | ICD-10-CM | POA: Diagnosis not present

## 2014-08-31 DIAGNOSIS — Z8781 Personal history of (healed) traumatic fracture: Secondary | ICD-10-CM | POA: Diagnosis not present

## 2014-08-31 DIAGNOSIS — R262 Difficulty in walking, not elsewhere classified: Secondary | ICD-10-CM | POA: Diagnosis not present

## 2014-08-31 DIAGNOSIS — M6281 Muscle weakness (generalized): Secondary | ICD-10-CM | POA: Diagnosis not present

## 2014-08-31 LAB — CUP PACEART INCLINIC DEVICE CHECK
Brady Statistic RV Percent Paced: 0.03 %
Date Time Interrogation Session: 20160510131802
Lead Channel Impedance Value: 425 Ohm
Lead Channel Pacing Threshold Amplitude: 0.75 V
Lead Channel Pacing Threshold Amplitude: 1 V
Lead Channel Pacing Threshold Pulse Width: 0.5 ms
Lead Channel Pacing Threshold Pulse Width: 0.5 ms
Lead Channel Sensing Intrinsic Amplitude: 12 mV
Lead Channel Sensing Intrinsic Amplitude: 4.1 mV
Lead Channel Setting Pacing Amplitude: 2 V
MDC IDC MSMT BATTERY REMAINING LONGEVITY: 122.4 mo
MDC IDC MSMT BATTERY VOLTAGE: 2.95 V
MDC IDC MSMT LEADCHNL RA IMPEDANCE VALUE: 412.5 Ohm
MDC IDC SET LEADCHNL RV PACING AMPLITUDE: 2.5 V
MDC IDC SET LEADCHNL RV PACING PULSEWIDTH: 0.5 ms
MDC IDC SET LEADCHNL RV SENSING SENSITIVITY: 2 mV
MDC IDC STAT BRADY RA PERCENT PACED: 26 %
Pulse Gen Serial Number: 7316836

## 2014-08-31 NOTE — Assessment & Plan Note (Signed)
Her [employer] Jude dual-chamber pacemaker is working normally. We'll plan to recheck in several months. 

## 2014-08-31 NOTE — Assessment & Plan Note (Signed)
Her blood pressure is minimally elevated today. She has whitecoat hypertension. She will continue her current medications. She will maintain a low-sodium diet.

## 2014-08-31 NOTE — Progress Notes (Signed)
HPI  [name] returns today for followup. She is a very pleasant 81 year old woman with a history of hypertension, palpitations, symptomatic bradycardia, status post permanent pacemaker insertion. She has been stable in the past year and has not had to go to the hospital. She is living in assisted living. She admits to be fairly sedentary. Allergies  Allergen Reactions  . Tramadol Itching and Nausea Only  . Sulfa Antibiotics Nausea And Vomiting     Current Outpatient Prescriptions  Medication Sig Dispense Refill  . acetaminophen (TYLENOL) 325 MG tablet Take 650 mg by mouth every 6 (six) hours as needed for moderate pain or headache.     Marland Kitchen amLODipine (NORVASC) 5 MG tablet Take 1 tablet by mouth daily.    Marland Kitchen aspirin 325 MG tablet Take 325 mg by mouth daily.    . beta carotene w/minerals (OCUVITE) tablet Take 1 tablet by mouth 2 (two) times daily.    . bisacodyl (DULCOLAX) 5 MG EC tablet Take 2 tablets (10 mg total) by mouth daily as needed for moderate constipation. Take at 2pm day before procedure 1 tablet 0  . cetirizine (ZYRTEC) 10 MG tablet Take 10 mg by mouth at bedtime.    . cycloSPORINE (RESTASIS) 0.05 % ophthalmic emulsion Place 1 drop into both eyes 2 (two) times daily.     Marland Kitchen docusate sodium (COLACE) 100 MG capsule Take 100 mg by mouth at bedtime.    . fluticasone (FLONASE) 50 MCG/ACT nasal spray Place 2 sprays into the nose daily. For allergies    . hydrALAZINE (APRESOLINE) 25 MG tablet Take 25 mg by mouth 2 (two) times daily.      . Iron-FA-B Cmp-C-Biot-Probiotic (FUSION PLUS PO) Take 1 capsule by mouth daily.    Marland Kitchen levothyroxine (SYNTHROID, LEVOTHROID) 112 MCG tablet Take 112 mcg by mouth daily before breakfast.    . lisinopril (PRINIVIL,ZESTRIL) 20 MG tablet Take 20 mg by mouth 2 (two) times daily.     Marland Kitchen loperamide (IMODIUM) 2 MG capsule Take 2 mg by mouth every 4 (four) hours as needed for diarrhea or loose stools.    . Magnesium 250 MG TABS Take 1 tablet by mouth every evening.     . Melatonin 1 MG TABS Take 1 tablet by mouth at bedtime.    . Multiple Vitamins-Minerals (THERATRUM COMPLETE PO) Take 1 tablet by mouth 2 (two) times daily.    Marland Kitchen omeprazole (PRILOSEC) 20 MG capsule Take 20 mg by mouth every morning.     . polyethylene glycol powder (GLYCOLAX/MIRALAX) powder Take 17 g by mouth daily. For constipation. *One capful to be mixed in water or juice*    . Probiotic Product (ALIGN) 4 MG CAPS Take 1 capsule by mouth daily.    . sodium phosphate (FLEET) 7-19 GM/118ML ENEM Place 1 enema rectally every other day as needed (if patient has no BM for 2 days).  0  . Wheat Dextrin (BENEFIBER DRINK MIX PO) Take by mouth daily. Mix 2 teaspoonfuls with water and drink daily.     No current facility-administered medications for this visit.     Past Medical History  Diagnosis Date  . Hypertension   . Hypothyroidism   . Bradycardia   . Anemia   . Diverticulosis   . External hemorrhoids   . Constipation   . Macular degeneration   . Seasonal allergies   . Osteoporosis   . Degenerative joint disease   . Palpitations   . Hyperlipidemia   . GERD (gastroesophageal reflux disease)   .  Pacemaker   . Thyrotoxicosis without mention of goiter or other cause, without mention of thyrotoxic crisis or storm   . Goiter, unspecified     ROS:   All systems reviewed and negative except as noted in the HPI.   Past Surgical History  Procedure Laterality Date  . Thyroidectomy    . Tonsillectomy    . Colonoscopy  November 2011    External hemorrhoids, diverticulosis, anal papillae  . Insert / replace / remove pacemaker    . Vertebroplasty    . Femur im nail Left 12/29/2012    Procedure: INTRAMEDULLARY (IM) NAIL INTERTROCHANTRIC;  Surgeon: Melina Schools, MD;  Location: [place];  Service: Orthopedics;  Laterality: Left;  . Permanent pacemaker insertion N/A 06/07/2011    Procedure: PERMANENT PACEMAKER INSERTION;  Surgeon: Evans Lance, MD;  Location: [facility] CATH LAB;  Service:  Cardiovascular;  Laterality: N/A;  . Colonoscopy N/A 04/01/2014    Procedure: COLONOSCOPY;  Surgeon: Rogene Houston, MD;  Location: [facility];  Service: Endoscopy;  Laterality: N/A;  930     Family History  Problem Relation Age of Onset  . Heart attack Mother     Deceased  . Cancer Father     Deceased, colon cancer age 57  . Cancer Brother     Deceased, throat and lung     History   Social History  . Marital Status: Widowed    Spouse Name: N/A  . Number of Children: N/A  . Years of Education: N/A   Occupational History  . Not on file.   Social History Main Topics  . Smoking status: Never Smoker   . Smokeless tobacco: Never Used  . Alcohol Use: No  . Drug Use: No  . Sexual Activity: Not on file   Other Topics Concern  . Not on file   Social History Narrative     BP 142/60 mmHg  Pulse 77  Ht 5' 5.5" (1.664 m)  Wt 144 lb 9.6 oz (65.59 kg)  BMI 23.69 kg/m2  Physical Exam:  Well appearing 81 year old woman,NAD HEENT: Unremarkable Neck:  7 cm JVD, no thyromegally Lungs:  Clear with no wheezes, rales, or rhonchi. HEART:  Regular rate rhythm, no murmurs, no rubs, no clicks Abd:  soft, positive bowel sounds, no organomegally, no rebound, no guarding Ext:  2 plus pulses, no edema, no cyanosis, no clubbing Skin:  No rashes no nodules Neuro:  CN II through XII intact, motor grossly intact  DEVICE  Normal device function.  See PaceArt for details.   Assess/Plan:

## 2014-08-31 NOTE — Assessment & Plan Note (Signed)
Her symptoms are very minimal. No change in medications. She will avoid caffeine.

## 2014-08-31 NOTE — Patient Instructions (Signed)
Medication Instructions:  Your physician recommends that you continue on your current medications as directed. Please refer to the Current Medication list given to you today.   Labwork: None ordered  Testing/Procedures: None ordered  Follow-Up: Remote monitoring is used to monitor your Pacemaker or ICD from home. This monitoring reduces the number of office visits required to check your device to one time per year. It allows us to keep an eye on the functioning of your device to ensure it is working properly. You are scheduled for a device check from home on 11/30/14. You may send your transmission at any time that day. If you have a wireless device, the transmission will be sent automatically. After your physician reviews your transmission, you will receive a postcard with your next transmission date.  Your physician wants you to follow-up in: 12 months with Dr Taylor You will receive a reminder letter in the mail two months in advance. If you don't receive a letter, please call our office to schedule the follow-up appointment.   Any Other Special Instructions Will Be Listed Below (If Applicable).   

## 2014-09-02 DIAGNOSIS — R262 Difficulty in walking, not elsewhere classified: Secondary | ICD-10-CM | POA: Diagnosis not present

## 2014-09-02 DIAGNOSIS — Z8781 Personal history of (healed) traumatic fracture: Secondary | ICD-10-CM | POA: Diagnosis not present

## 2014-09-02 DIAGNOSIS — M1712 Unilateral primary osteoarthritis, left knee: Secondary | ICD-10-CM | POA: Diagnosis not present

## 2014-09-02 DIAGNOSIS — M6281 Muscle weakness (generalized): Secondary | ICD-10-CM | POA: Diagnosis not present

## 2014-09-06 DIAGNOSIS — M6281 Muscle weakness (generalized): Secondary | ICD-10-CM | POA: Diagnosis not present

## 2014-09-06 DIAGNOSIS — H3531 Nonexudative age-related macular degeneration: Secondary | ICD-10-CM | POA: Diagnosis not present

## 2014-09-06 DIAGNOSIS — Z8781 Personal history of (healed) traumatic fracture: Secondary | ICD-10-CM | POA: Diagnosis not present

## 2014-09-06 DIAGNOSIS — H35363 Drusen (degenerative) of macula, bilateral: Secondary | ICD-10-CM | POA: Diagnosis not present

## 2014-09-06 DIAGNOSIS — M1712 Unilateral primary osteoarthritis, left knee: Secondary | ICD-10-CM | POA: Diagnosis not present

## 2014-09-06 DIAGNOSIS — H35351 Cystoid macular degeneration, right eye: Secondary | ICD-10-CM | POA: Diagnosis not present

## 2014-09-06 DIAGNOSIS — R262 Difficulty in walking, not elsewhere classified: Secondary | ICD-10-CM | POA: Diagnosis not present

## 2014-09-07 DIAGNOSIS — M1712 Unilateral primary osteoarthritis, left knee: Secondary | ICD-10-CM | POA: Diagnosis not present

## 2014-09-07 DIAGNOSIS — R262 Difficulty in walking, not elsewhere classified: Secondary | ICD-10-CM | POA: Diagnosis not present

## 2014-09-07 DIAGNOSIS — M6281 Muscle weakness (generalized): Secondary | ICD-10-CM | POA: Diagnosis not present

## 2014-09-07 DIAGNOSIS — Z8781 Personal history of (healed) traumatic fracture: Secondary | ICD-10-CM | POA: Diagnosis not present

## 2014-09-09 DIAGNOSIS — Z8781 Personal history of (healed) traumatic fracture: Secondary | ICD-10-CM | POA: Diagnosis not present

## 2014-09-09 DIAGNOSIS — M6281 Muscle weakness (generalized): Secondary | ICD-10-CM | POA: Diagnosis not present

## 2014-09-09 DIAGNOSIS — M1712 Unilateral primary osteoarthritis, left knee: Secondary | ICD-10-CM | POA: Diagnosis not present

## 2014-09-09 DIAGNOSIS — R262 Difficulty in walking, not elsewhere classified: Secondary | ICD-10-CM | POA: Diagnosis not present

## 2014-09-13 DIAGNOSIS — M1712 Unilateral primary osteoarthritis, left knee: Secondary | ICD-10-CM | POA: Diagnosis not present

## 2014-09-13 DIAGNOSIS — Z8781 Personal history of (healed) traumatic fracture: Secondary | ICD-10-CM | POA: Diagnosis not present

## 2014-09-13 DIAGNOSIS — R262 Difficulty in walking, not elsewhere classified: Secondary | ICD-10-CM | POA: Diagnosis not present

## 2014-09-13 DIAGNOSIS — M6281 Muscle weakness (generalized): Secondary | ICD-10-CM | POA: Diagnosis not present

## 2014-09-14 DIAGNOSIS — Z8781 Personal history of (healed) traumatic fracture: Secondary | ICD-10-CM | POA: Diagnosis not present

## 2014-09-14 DIAGNOSIS — M1712 Unilateral primary osteoarthritis, left knee: Secondary | ICD-10-CM | POA: Diagnosis not present

## 2014-09-14 DIAGNOSIS — M6281 Muscle weakness (generalized): Secondary | ICD-10-CM | POA: Diagnosis not present

## 2014-09-14 DIAGNOSIS — R262 Difficulty in walking, not elsewhere classified: Secondary | ICD-10-CM | POA: Diagnosis not present

## 2014-09-15 ENCOUNTER — Encounter (INDEPENDENT_AMBULATORY_CARE_PROVIDER_SITE_OTHER): Payer: Self-pay | Admitting: *Deleted

## 2014-09-15 ENCOUNTER — Other Ambulatory Visit (INDEPENDENT_AMBULATORY_CARE_PROVIDER_SITE_OTHER): Payer: Self-pay | Admitting: *Deleted

## 2014-09-15 DIAGNOSIS — M6281 Muscle weakness (generalized): Secondary | ICD-10-CM | POA: Diagnosis not present

## 2014-09-15 DIAGNOSIS — D509 Iron deficiency anemia, unspecified: Secondary | ICD-10-CM

## 2014-09-15 DIAGNOSIS — G603 Idiopathic progressive neuropathy: Secondary | ICD-10-CM | POA: Diagnosis not present

## 2014-09-15 DIAGNOSIS — R5383 Other fatigue: Secondary | ICD-10-CM | POA: Diagnosis not present

## 2014-09-15 DIAGNOSIS — E039 Hypothyroidism, unspecified: Secondary | ICD-10-CM | POA: Diagnosis not present

## 2014-09-15 DIAGNOSIS — M353 Polymyalgia rheumatica: Secondary | ICD-10-CM | POA: Diagnosis not present

## 2014-09-16 DIAGNOSIS — Z8781 Personal history of (healed) traumatic fracture: Secondary | ICD-10-CM | POA: Diagnosis not present

## 2014-09-16 DIAGNOSIS — M1712 Unilateral primary osteoarthritis, left knee: Secondary | ICD-10-CM | POA: Diagnosis not present

## 2014-09-16 DIAGNOSIS — R262 Difficulty in walking, not elsewhere classified: Secondary | ICD-10-CM | POA: Diagnosis not present

## 2014-09-16 DIAGNOSIS — M6281 Muscle weakness (generalized): Secondary | ICD-10-CM | POA: Diagnosis not present

## 2014-09-23 ENCOUNTER — Encounter (HOSPITAL_COMMUNITY)
Admission: RE | Admit: 2014-09-23 | Discharge: 2014-09-23 | Disposition: A | Discharge status: Home / Self Care | Payer: Medicare Other | Source: Ambulatory Visit | Attending: Internal Medicine | Admitting: Internal Medicine

## 2014-09-23 ENCOUNTER — Encounter (HOSPITAL_COMMUNITY): Payer: Self-pay

## 2014-09-23 DIAGNOSIS — D509 Iron deficiency anemia, unspecified: Secondary | ICD-10-CM | POA: Diagnosis not present

## 2014-09-23 MED ORDER — SODIUM CHLORIDE 0.9 % IV SOLN
Freq: Once | INTRAVENOUS | Status: AC
  Administered 2014-09-23: 13:00:00 via INTRAVENOUS

## 2014-09-23 MED ORDER — SODIUM CHLORIDE 0.9 % IV SOLN
510.0000 mg | Freq: Once | INTRAVENOUS | Status: AC
  Administered 2014-09-23: 510 mg via INTRAVENOUS
  Filled 2014-09-23: qty 17

## 2014-09-23 NOTE — Discharge Instructions (Signed)
Ferumoxytol injection What is this medicine? FERUMOXYTOL is an iron complex. Iron is used to make healthy red blood cells, which carry oxygen and nutrients throughout the body. This medicine is used to treat iron deficiency anemia in people with chronic kidney disease. This medicine may be used for other purposes; ask your health care provider or pharmacist if you have questions. COMMON BRAND NAME(S): Feraheme What should I tell my health care provider before I take this medicine? They need to know if you have any of these conditions: -anemia not caused by low iron levels -high levels of iron in the blood -magnetic resonance imaging (MRI) test scheduled -an unusual or allergic reaction to iron, other medicines, foods, dyes, or preservatives -pregnant or trying to get pregnant -breast-feeding How should I use this medicine? This medicine is for injection into a vein. It is given by a health care professional in a hospital or clinic setting. Talk to your pediatrician regarding the use of this medicine in children. Special care may be needed. Overdosage: If you think you've taken too much of this medicine contact a poison control center or emergency room at once. Overdosage: If you think you have taken too much of this medicine contact a poison control center or emergency room at once. NOTE: This medicine is only for you. Do not share this medicine with others. What if I miss a dose? It is important not to miss your dose. Call your doctor or health care professional if you are unable to keep an appointment. What may interact with this medicine? This medicine may interact with the following medications: -other iron products This list may not describe all possible interactions. Give your health care provider a list of all the medicines, herbs, non-prescription drugs, or dietary supplements you use. Also tell them if you smoke, drink alcohol, or use illegal drugs. Some items may interact with your  medicine. What should I watch for while using this medicine? Visit your doctor or healthcare professional regularly. Tell your doctor or healthcare professional if your symptoms do not start to get better or if they get worse. You may need blood work done while you are taking this medicine. You may need to follow a special diet. Talk to your doctor. Foods that contain iron include: whole grains/cereals, dried fruits, beans, or peas, leafy green vegetables, and organ meats (liver, kidney). What side effects may I notice from receiving this medicine? Side effects that you should report to your doctor or health care professional as soon as possible: -allergic reactions like skin rash, itching or hives, swelling of the face, lips, or tongue -breathing problems -changes in blood pressure -feeling faint or lightheaded, falls -fever or chills -flushing, sweating, or hot feelings -swelling of the ankles or feet Side effects that usually do not require medical attention (Report these to your doctor or health care professional if they continue or are bothersome.): -diarrhea -headache -nausea, vomiting -stomach pain This list may not describe all possible side effects. Call your doctor for medical advice about side effects. You may report side effects to FDA at 1-800-FDA-1088. Where should I keep my medicine? This drug is given in a hospital or clinic and will not be stored at home. NOTE: This sheet is a summary. It may not cover all possible information. If you have questions about this medicine, talk to your doctor, pharmacist, or health care provider.  2015, Elsevier/Gold Standard. (2011-11-23 15:23:36) Iron Deficiency Anemia Anemia is a condition in which there are less red blood  cells or hemoglobin in the blood than normal. Hemoglobin is the part of red blood cells that carries oxygen. Iron deficiency anemia is anemia caused by too little iron. It is the most common type of anemia. It may leave  you tired and short of breath. CAUSES   Lack of iron in the diet.  Poor absorption of iron, as seen with intestinal disorders.  Intestinal bleeding.  Heavy periods. SIGNS AND SYMPTOMS  Mild anemia may not be noticeable. Symptoms may include:  Fatigue.  Headache.  Pale skin.  Weakness.  Tiredness.  Shortness of breath.  Dizziness.  Cold hands and feet.  Fast or irregular heartbeat. DIAGNOSIS  Diagnosis requires a thorough evaluation and physical exam by your health care provider. Blood tests are generally used to confirm iron deficiency anemia. Additional tests may be done to find the underlying cause of your anemia. These may include:  Testing for blood in the stool (fecal occult blood test).  A procedure to see inside the colon and rectum (colonoscopy).  A procedure to see inside the esophagus and stomach (endoscopy). TREATMENT  Iron deficiency anemia is treated by correcting the cause of the deficiency. Treatment may involve:  Adding iron-rich foods to your diet.  Taking iron supplements. Pregnant or breastfeeding women need to take extra iron because their normal diet usually does not provide the required amount.  Taking vitamins. Vitamin C improves the absorption of iron. Your health care provider may recommend that you take your iron tablets with a glass of orange juice or vitamin C supplement.  Medicines to make heavy menstrual flow lighter.  Surgery. HOME CARE INSTRUCTIONS   Take iron as directed by your health care provider.  If you cannot tolerate taking iron supplements by mouth, talk to your health care provider about taking them through a vein (intravenously) or an injection into a muscle.  For the best iron absorption, iron supplements should be taken on an empty stomach. If you cannot tolerate them on an empty stomach, you may need to take them with food.  Do not drink milk or take antacids at the same time as your iron supplements. Milk and  antacids may interfere with the absorption of iron.  Iron supplements can cause constipation. Make sure to include fiber in your diet to prevent constipation. A stool softener may also be recommended.  Take vitamins as directed by your health care provider.  Eat a diet rich in iron. Foods high in iron include liver, lean beef, whole-grain bread, eggs, dried fruit, and dark green leafy vegetables. SEEK IMMEDIATE MEDICAL CARE IF:   You faint. If this happens, do not drive. Call your local emergency services (911 in [place] if no other help is available.  You have chest pain.  You feel nauseous or vomit.  You have severe or increased shortness of breath with activity.  You feel weak.  You have a rapid heartbeat.  You have unexplained sweating.  You become light-headed when getting up from a chair or bed. MAKE SURE YOU:   Understand these instructions.  Will watch your condition.  Will get help right away if you are not doing well or get worse. Document Released: 04/06/2000 Document Revised: 04/14/2013 Document Reviewed: 12/15/2012 [facility] Patient Information 2015 [place], [place]. This information is not intended to replace advice given to you by your health care provider. Make sure you discuss any questions you have with your health care provider.

## 2014-09-27 DIAGNOSIS — M6281 Muscle weakness (generalized): Secondary | ICD-10-CM | POA: Diagnosis not present

## 2014-09-27 DIAGNOSIS — M1712 Unilateral primary osteoarthritis, left knee: Secondary | ICD-10-CM | POA: Diagnosis not present

## 2014-09-27 DIAGNOSIS — R262 Difficulty in walking, not elsewhere classified: Secondary | ICD-10-CM | POA: Diagnosis not present

## 2014-09-27 DIAGNOSIS — Z8781 Personal history of (healed) traumatic fracture: Secondary | ICD-10-CM | POA: Diagnosis not present

## 2014-09-28 DIAGNOSIS — M1712 Unilateral primary osteoarthritis, left knee: Secondary | ICD-10-CM | POA: Diagnosis not present

## 2014-09-28 DIAGNOSIS — R262 Difficulty in walking, not elsewhere classified: Secondary | ICD-10-CM | POA: Diagnosis not present

## 2014-09-28 DIAGNOSIS — M6281 Muscle weakness (generalized): Secondary | ICD-10-CM | POA: Diagnosis not present

## 2014-09-28 DIAGNOSIS — Z8781 Personal history of (healed) traumatic fracture: Secondary | ICD-10-CM | POA: Diagnosis not present

## 2014-09-30 DIAGNOSIS — M6281 Muscle weakness (generalized): Secondary | ICD-10-CM | POA: Diagnosis not present

## 2014-09-30 DIAGNOSIS — R262 Difficulty in walking, not elsewhere classified: Secondary | ICD-10-CM | POA: Diagnosis not present

## 2014-09-30 DIAGNOSIS — M1712 Unilateral primary osteoarthritis, left knee: Secondary | ICD-10-CM | POA: Diagnosis not present

## 2014-09-30 DIAGNOSIS — Z8781 Personal history of (healed) traumatic fracture: Secondary | ICD-10-CM | POA: Diagnosis not present

## 2014-10-05 DIAGNOSIS — M1712 Unilateral primary osteoarthritis, left knee: Secondary | ICD-10-CM | POA: Diagnosis not present

## 2014-10-05 DIAGNOSIS — R262 Difficulty in walking, not elsewhere classified: Secondary | ICD-10-CM | POA: Diagnosis not present

## 2014-10-05 DIAGNOSIS — M6281 Muscle weakness (generalized): Secondary | ICD-10-CM | POA: Diagnosis not present

## 2014-10-05 DIAGNOSIS — Z8781 Personal history of (healed) traumatic fracture: Secondary | ICD-10-CM | POA: Diagnosis not present

## 2014-10-06 DIAGNOSIS — M6281 Muscle weakness (generalized): Secondary | ICD-10-CM | POA: Diagnosis not present

## 2014-10-06 DIAGNOSIS — Z8781 Personal history of (healed) traumatic fracture: Secondary | ICD-10-CM | POA: Diagnosis not present

## 2014-10-06 DIAGNOSIS — M1712 Unilateral primary osteoarthritis, left knee: Secondary | ICD-10-CM | POA: Diagnosis not present

## 2014-10-06 DIAGNOSIS — R262 Difficulty in walking, not elsewhere classified: Secondary | ICD-10-CM | POA: Diagnosis not present

## 2014-10-07 DIAGNOSIS — R262 Difficulty in walking, not elsewhere classified: Secondary | ICD-10-CM | POA: Diagnosis not present

## 2014-10-07 DIAGNOSIS — Z8781 Personal history of (healed) traumatic fracture: Secondary | ICD-10-CM | POA: Diagnosis not present

## 2014-10-07 DIAGNOSIS — M1712 Unilateral primary osteoarthritis, left knee: Secondary | ICD-10-CM | POA: Diagnosis not present

## 2014-10-07 DIAGNOSIS — M6281 Muscle weakness (generalized): Secondary | ICD-10-CM | POA: Diagnosis not present

## 2014-10-11 DIAGNOSIS — Z8781 Personal history of (healed) traumatic fracture: Secondary | ICD-10-CM | POA: Diagnosis not present

## 2014-10-11 DIAGNOSIS — D509 Iron deficiency anemia, unspecified: Secondary | ICD-10-CM | POA: Diagnosis not present

## 2014-10-11 DIAGNOSIS — I10 Essential (primary) hypertension: Secondary | ICD-10-CM | POA: Diagnosis not present

## 2014-10-11 DIAGNOSIS — M6281 Muscle weakness (generalized): Secondary | ICD-10-CM | POA: Diagnosis not present

## 2014-10-11 DIAGNOSIS — R262 Difficulty in walking, not elsewhere classified: Secondary | ICD-10-CM | POA: Diagnosis not present

## 2014-10-11 DIAGNOSIS — M1712 Unilateral primary osteoarthritis, left knee: Secondary | ICD-10-CM | POA: Diagnosis not present

## 2014-10-11 DIAGNOSIS — E782 Mixed hyperlipidemia: Secondary | ICD-10-CM | POA: Diagnosis not present

## 2014-10-11 DIAGNOSIS — M353 Polymyalgia rheumatica: Secondary | ICD-10-CM | POA: Diagnosis not present

## 2014-10-12 DIAGNOSIS — Z8781 Personal history of (healed) traumatic fracture: Secondary | ICD-10-CM | POA: Diagnosis not present

## 2014-10-12 DIAGNOSIS — M6281 Muscle weakness (generalized): Secondary | ICD-10-CM | POA: Diagnosis not present

## 2014-10-12 DIAGNOSIS — R262 Difficulty in walking, not elsewhere classified: Secondary | ICD-10-CM | POA: Diagnosis not present

## 2014-10-12 DIAGNOSIS — M1712 Unilateral primary osteoarthritis, left knee: Secondary | ICD-10-CM | POA: Diagnosis not present

## 2014-10-13 DIAGNOSIS — R5382 Chronic fatigue, unspecified: Secondary | ICD-10-CM | POA: Diagnosis not present

## 2014-10-13 DIAGNOSIS — M353 Polymyalgia rheumatica: Secondary | ICD-10-CM | POA: Diagnosis not present

## 2014-10-13 DIAGNOSIS — D509 Iron deficiency anemia, unspecified: Secondary | ICD-10-CM | POA: Diagnosis not present

## 2014-10-15 DIAGNOSIS — R262 Difficulty in walking, not elsewhere classified: Secondary | ICD-10-CM | POA: Diagnosis not present

## 2014-10-15 DIAGNOSIS — M1712 Unilateral primary osteoarthritis, left knee: Secondary | ICD-10-CM | POA: Diagnosis not present

## 2014-10-15 DIAGNOSIS — Z8781 Personal history of (healed) traumatic fracture: Secondary | ICD-10-CM | POA: Diagnosis not present

## 2014-10-15 DIAGNOSIS — M6281 Muscle weakness (generalized): Secondary | ICD-10-CM | POA: Diagnosis not present

## 2014-10-18 DIAGNOSIS — Z8781 Personal history of (healed) traumatic fracture: Secondary | ICD-10-CM | POA: Diagnosis not present

## 2014-10-18 DIAGNOSIS — M6281 Muscle weakness (generalized): Secondary | ICD-10-CM | POA: Diagnosis not present

## 2014-10-18 DIAGNOSIS — R262 Difficulty in walking, not elsewhere classified: Secondary | ICD-10-CM | POA: Diagnosis not present

## 2014-10-18 DIAGNOSIS — M1712 Unilateral primary osteoarthritis, left knee: Secondary | ICD-10-CM | POA: Diagnosis not present

## 2014-10-19 DIAGNOSIS — M6281 Muscle weakness (generalized): Secondary | ICD-10-CM | POA: Diagnosis not present

## 2014-10-19 DIAGNOSIS — R262 Difficulty in walking, not elsewhere classified: Secondary | ICD-10-CM | POA: Diagnosis not present

## 2014-10-19 DIAGNOSIS — M1712 Unilateral primary osteoarthritis, left knee: Secondary | ICD-10-CM | POA: Diagnosis not present

## 2014-10-19 DIAGNOSIS — Z8781 Personal history of (healed) traumatic fracture: Secondary | ICD-10-CM | POA: Diagnosis not present

## 2014-10-21 DIAGNOSIS — M1712 Unilateral primary osteoarthritis, left knee: Secondary | ICD-10-CM | POA: Diagnosis not present

## 2014-10-21 DIAGNOSIS — R262 Difficulty in walking, not elsewhere classified: Secondary | ICD-10-CM | POA: Diagnosis not present

## 2014-10-21 DIAGNOSIS — Z8781 Personal history of (healed) traumatic fracture: Secondary | ICD-10-CM | POA: Diagnosis not present

## 2014-10-21 DIAGNOSIS — M6281 Muscle weakness (generalized): Secondary | ICD-10-CM | POA: Diagnosis not present

## 2014-10-26 DIAGNOSIS — M1712 Unilateral primary osteoarthritis, left knee: Secondary | ICD-10-CM | POA: Diagnosis not present

## 2014-10-26 DIAGNOSIS — M542 Cervicalgia: Secondary | ICD-10-CM | POA: Diagnosis not present

## 2014-10-26 DIAGNOSIS — R262 Difficulty in walking, not elsewhere classified: Secondary | ICD-10-CM | POA: Diagnosis not present

## 2014-10-26 DIAGNOSIS — M545 Low back pain: Secondary | ICD-10-CM | POA: Diagnosis not present

## 2014-10-26 DIAGNOSIS — M47816 Spondylosis without myelopathy or radiculopathy, lumbar region: Secondary | ICD-10-CM | POA: Diagnosis not present

## 2014-10-26 DIAGNOSIS — M9903 Segmental and somatic dysfunction of lumbar region: Secondary | ICD-10-CM | POA: Diagnosis not present

## 2014-10-26 DIAGNOSIS — M6281 Muscle weakness (generalized): Secondary | ICD-10-CM | POA: Diagnosis not present

## 2014-10-26 DIAGNOSIS — M9901 Segmental and somatic dysfunction of cervical region: Secondary | ICD-10-CM | POA: Diagnosis not present

## 2014-10-26 DIAGNOSIS — M353 Polymyalgia rheumatica: Secondary | ICD-10-CM | POA: Diagnosis not present

## 2014-10-26 DIAGNOSIS — M5136 Other intervertebral disc degeneration, lumbar region: Secondary | ICD-10-CM | POA: Diagnosis not present

## 2014-10-26 DIAGNOSIS — Z8781 Personal history of (healed) traumatic fracture: Secondary | ICD-10-CM | POA: Diagnosis not present

## 2014-10-26 DIAGNOSIS — M47812 Spondylosis without myelopathy or radiculopathy, cervical region: Secondary | ICD-10-CM | POA: Diagnosis not present

## 2014-10-27 DIAGNOSIS — M1712 Unilateral primary osteoarthritis, left knee: Secondary | ICD-10-CM | POA: Diagnosis not present

## 2014-10-27 DIAGNOSIS — R262 Difficulty in walking, not elsewhere classified: Secondary | ICD-10-CM | POA: Diagnosis not present

## 2014-10-27 DIAGNOSIS — Z8781 Personal history of (healed) traumatic fracture: Secondary | ICD-10-CM | POA: Diagnosis not present

## 2014-10-27 DIAGNOSIS — M545 Low back pain: Secondary | ICD-10-CM | POA: Diagnosis not present

## 2014-10-27 DIAGNOSIS — M6281 Muscle weakness (generalized): Secondary | ICD-10-CM | POA: Diagnosis not present

## 2014-10-27 DIAGNOSIS — M353 Polymyalgia rheumatica: Secondary | ICD-10-CM | POA: Diagnosis not present

## 2014-10-28 DIAGNOSIS — M353 Polymyalgia rheumatica: Secondary | ICD-10-CM | POA: Diagnosis not present

## 2014-10-28 DIAGNOSIS — R262 Difficulty in walking, not elsewhere classified: Secondary | ICD-10-CM | POA: Diagnosis not present

## 2014-10-28 DIAGNOSIS — M6281 Muscle weakness (generalized): Secondary | ICD-10-CM | POA: Diagnosis not present

## 2014-10-28 DIAGNOSIS — Z8781 Personal history of (healed) traumatic fracture: Secondary | ICD-10-CM | POA: Diagnosis not present

## 2014-10-28 DIAGNOSIS — M545 Low back pain: Secondary | ICD-10-CM | POA: Diagnosis not present

## 2014-10-28 DIAGNOSIS — M1712 Unilateral primary osteoarthritis, left knee: Secondary | ICD-10-CM | POA: Diagnosis not present

## 2014-11-01 DIAGNOSIS — M353 Polymyalgia rheumatica: Secondary | ICD-10-CM | POA: Diagnosis not present

## 2014-11-01 DIAGNOSIS — Z8781 Personal history of (healed) traumatic fracture: Secondary | ICD-10-CM | POA: Diagnosis not present

## 2014-11-01 DIAGNOSIS — M6281 Muscle weakness (generalized): Secondary | ICD-10-CM | POA: Diagnosis not present

## 2014-11-01 DIAGNOSIS — R262 Difficulty in walking, not elsewhere classified: Secondary | ICD-10-CM | POA: Diagnosis not present

## 2014-11-01 DIAGNOSIS — M1712 Unilateral primary osteoarthritis, left knee: Secondary | ICD-10-CM | POA: Diagnosis not present

## 2014-11-01 DIAGNOSIS — M545 Low back pain: Secondary | ICD-10-CM | POA: Diagnosis not present

## 2014-11-02 DIAGNOSIS — M353 Polymyalgia rheumatica: Secondary | ICD-10-CM | POA: Diagnosis not present

## 2014-11-02 DIAGNOSIS — M545 Low back pain: Secondary | ICD-10-CM | POA: Diagnosis not present

## 2014-11-02 DIAGNOSIS — M6281 Muscle weakness (generalized): Secondary | ICD-10-CM | POA: Diagnosis not present

## 2014-11-02 DIAGNOSIS — Z8781 Personal history of (healed) traumatic fracture: Secondary | ICD-10-CM | POA: Diagnosis not present

## 2014-11-02 DIAGNOSIS — M1712 Unilateral primary osteoarthritis, left knee: Secondary | ICD-10-CM | POA: Diagnosis not present

## 2014-11-02 DIAGNOSIS — R262 Difficulty in walking, not elsewhere classified: Secondary | ICD-10-CM | POA: Diagnosis not present

## 2014-11-04 DIAGNOSIS — M353 Polymyalgia rheumatica: Secondary | ICD-10-CM | POA: Diagnosis not present

## 2014-11-04 DIAGNOSIS — M545 Low back pain: Secondary | ICD-10-CM | POA: Diagnosis not present

## 2014-11-04 DIAGNOSIS — Z8781 Personal history of (healed) traumatic fracture: Secondary | ICD-10-CM | POA: Diagnosis not present

## 2014-11-04 DIAGNOSIS — M6281 Muscle weakness (generalized): Secondary | ICD-10-CM | POA: Diagnosis not present

## 2014-11-04 DIAGNOSIS — R262 Difficulty in walking, not elsewhere classified: Secondary | ICD-10-CM | POA: Diagnosis not present

## 2014-11-04 DIAGNOSIS — M1712 Unilateral primary osteoarthritis, left knee: Secondary | ICD-10-CM | POA: Diagnosis not present

## 2014-11-08 DIAGNOSIS — M9902 Segmental and somatic dysfunction of thoracic region: Secondary | ICD-10-CM | POA: Diagnosis not present

## 2014-11-08 DIAGNOSIS — M546 Pain in thoracic spine: Secondary | ICD-10-CM | POA: Diagnosis not present

## 2014-11-08 DIAGNOSIS — M545 Low back pain: Secondary | ICD-10-CM | POA: Diagnosis not present

## 2014-11-08 DIAGNOSIS — M9903 Segmental and somatic dysfunction of lumbar region: Secondary | ICD-10-CM | POA: Diagnosis not present

## 2014-11-09 DIAGNOSIS — M1712 Unilateral primary osteoarthritis, left knee: Secondary | ICD-10-CM | POA: Diagnosis not present

## 2014-11-09 DIAGNOSIS — M353 Polymyalgia rheumatica: Secondary | ICD-10-CM | POA: Diagnosis not present

## 2014-11-09 DIAGNOSIS — M545 Low back pain: Secondary | ICD-10-CM | POA: Diagnosis not present

## 2014-11-09 DIAGNOSIS — M6281 Muscle weakness (generalized): Secondary | ICD-10-CM | POA: Diagnosis not present

## 2014-11-09 DIAGNOSIS — Z8781 Personal history of (healed) traumatic fracture: Secondary | ICD-10-CM | POA: Diagnosis not present

## 2014-11-09 DIAGNOSIS — R262 Difficulty in walking, not elsewhere classified: Secondary | ICD-10-CM | POA: Diagnosis not present

## 2014-11-11 DIAGNOSIS — M6281 Muscle weakness (generalized): Secondary | ICD-10-CM | POA: Diagnosis not present

## 2014-11-11 DIAGNOSIS — M9903 Segmental and somatic dysfunction of lumbar region: Secondary | ICD-10-CM | POA: Diagnosis not present

## 2014-11-11 DIAGNOSIS — Z8781 Personal history of (healed) traumatic fracture: Secondary | ICD-10-CM | POA: Diagnosis not present

## 2014-11-11 DIAGNOSIS — M9902 Segmental and somatic dysfunction of thoracic region: Secondary | ICD-10-CM | POA: Diagnosis not present

## 2014-11-11 DIAGNOSIS — M545 Low back pain: Secondary | ICD-10-CM | POA: Diagnosis not present

## 2014-11-11 DIAGNOSIS — R262 Difficulty in walking, not elsewhere classified: Secondary | ICD-10-CM | POA: Diagnosis not present

## 2014-11-11 DIAGNOSIS — M1712 Unilateral primary osteoarthritis, left knee: Secondary | ICD-10-CM | POA: Diagnosis not present

## 2014-11-11 DIAGNOSIS — M353 Polymyalgia rheumatica: Secondary | ICD-10-CM | POA: Diagnosis not present

## 2014-11-11 DIAGNOSIS — M546 Pain in thoracic spine: Secondary | ICD-10-CM | POA: Diagnosis not present

## 2014-11-15 DIAGNOSIS — M9902 Segmental and somatic dysfunction of thoracic region: Secondary | ICD-10-CM | POA: Diagnosis not present

## 2014-11-15 DIAGNOSIS — M545 Low back pain: Secondary | ICD-10-CM | POA: Diagnosis not present

## 2014-11-15 DIAGNOSIS — Z8781 Personal history of (healed) traumatic fracture: Secondary | ICD-10-CM | POA: Diagnosis not present

## 2014-11-15 DIAGNOSIS — M353 Polymyalgia rheumatica: Secondary | ICD-10-CM | POA: Diagnosis not present

## 2014-11-15 DIAGNOSIS — M9903 Segmental and somatic dysfunction of lumbar region: Secondary | ICD-10-CM | POA: Diagnosis not present

## 2014-11-15 DIAGNOSIS — M6281 Muscle weakness (generalized): Secondary | ICD-10-CM | POA: Diagnosis not present

## 2014-11-15 DIAGNOSIS — M1712 Unilateral primary osteoarthritis, left knee: Secondary | ICD-10-CM | POA: Diagnosis not present

## 2014-11-15 DIAGNOSIS — M546 Pain in thoracic spine: Secondary | ICD-10-CM | POA: Diagnosis not present

## 2014-11-15 DIAGNOSIS — R262 Difficulty in walking, not elsewhere classified: Secondary | ICD-10-CM | POA: Diagnosis not present

## 2014-11-16 DIAGNOSIS — Z8781 Personal history of (healed) traumatic fracture: Secondary | ICD-10-CM | POA: Diagnosis not present

## 2014-11-16 DIAGNOSIS — R262 Difficulty in walking, not elsewhere classified: Secondary | ICD-10-CM | POA: Diagnosis not present

## 2014-11-16 DIAGNOSIS — M6281 Muscle weakness (generalized): Secondary | ICD-10-CM | POA: Diagnosis not present

## 2014-11-16 DIAGNOSIS — M1712 Unilateral primary osteoarthritis, left knee: Secondary | ICD-10-CM | POA: Diagnosis not present

## 2014-11-16 DIAGNOSIS — M353 Polymyalgia rheumatica: Secondary | ICD-10-CM | POA: Diagnosis not present

## 2014-11-16 DIAGNOSIS — M545 Low back pain: Secondary | ICD-10-CM | POA: Diagnosis not present

## 2014-11-18 DIAGNOSIS — M545 Low back pain: Secondary | ICD-10-CM | POA: Diagnosis not present

## 2014-11-18 DIAGNOSIS — M546 Pain in thoracic spine: Secondary | ICD-10-CM | POA: Diagnosis not present

## 2014-11-18 DIAGNOSIS — Z8781 Personal history of (healed) traumatic fracture: Secondary | ICD-10-CM | POA: Diagnosis not present

## 2014-11-18 DIAGNOSIS — R262 Difficulty in walking, not elsewhere classified: Secondary | ICD-10-CM | POA: Diagnosis not present

## 2014-11-18 DIAGNOSIS — M9902 Segmental and somatic dysfunction of thoracic region: Secondary | ICD-10-CM | POA: Diagnosis not present

## 2014-11-18 DIAGNOSIS — M9903 Segmental and somatic dysfunction of lumbar region: Secondary | ICD-10-CM | POA: Diagnosis not present

## 2014-11-18 DIAGNOSIS — M353 Polymyalgia rheumatica: Secondary | ICD-10-CM | POA: Diagnosis not present

## 2014-11-18 DIAGNOSIS — M6281 Muscle weakness (generalized): Secondary | ICD-10-CM | POA: Diagnosis not present

## 2014-11-18 DIAGNOSIS — M1712 Unilateral primary osteoarthritis, left knee: Secondary | ICD-10-CM | POA: Diagnosis not present

## 2014-11-21 ENCOUNTER — Encounter (HOSPITAL_COMMUNITY): Payer: Self-pay | Admitting: Emergency Medicine

## 2014-11-21 ENCOUNTER — Inpatient Hospital Stay (HOSPITAL_COMMUNITY)
Admission: EM | Admit: 2014-11-21 | Discharge: 2014-11-23 | DRG: 563 | Disposition: A | Discharge status: Nursing Home (Includes ICF) | Payer: Medicare Other | Attending: Internal Medicine | Admitting: Internal Medicine

## 2014-11-21 ENCOUNTER — Emergency Department (HOSPITAL_COMMUNITY): Payer: Medicare Other

## 2014-11-21 ENCOUNTER — Observation Stay (HOSPITAL_COMMUNITY): Payer: Medicare Other

## 2014-11-21 DIAGNOSIS — K219 Gastro-esophageal reflux disease without esophagitis: Secondary | ICD-10-CM | POA: Diagnosis present

## 2014-11-21 DIAGNOSIS — S3993XA Unspecified injury of pelvis, initial encounter: Secondary | ICD-10-CM | POA: Diagnosis not present

## 2014-11-21 DIAGNOSIS — Z7952 Long term (current) use of systemic steroids: Secondary | ICD-10-CM

## 2014-11-21 DIAGNOSIS — Z800 Family history of malignant neoplasm of digestive organs: Secondary | ICD-10-CM

## 2014-11-21 DIAGNOSIS — S42201A Unspecified fracture of upper end of right humerus, initial encounter for closed fracture: Secondary | ICD-10-CM | POA: Diagnosis not present

## 2014-11-21 DIAGNOSIS — W1830XA Fall on same level, unspecified, initial encounter: Secondary | ICD-10-CM | POA: Diagnosis present

## 2014-11-21 DIAGNOSIS — E785 Hyperlipidemia, unspecified: Secondary | ICD-10-CM | POA: Diagnosis present

## 2014-11-21 DIAGNOSIS — Z7982 Long term (current) use of aspirin: Secondary | ICD-10-CM

## 2014-11-21 DIAGNOSIS — I10 Essential (primary) hypertension: Secondary | ICD-10-CM | POA: Diagnosis present

## 2014-11-21 DIAGNOSIS — R627 Adult failure to thrive: Secondary | ICD-10-CM | POA: Diagnosis present

## 2014-11-21 DIAGNOSIS — S79922A Unspecified injury of left thigh, initial encounter: Secondary | ICD-10-CM | POA: Diagnosis not present

## 2014-11-21 DIAGNOSIS — E860 Dehydration: Secondary | ICD-10-CM | POA: Diagnosis not present

## 2014-11-21 DIAGNOSIS — S42202A Unspecified fracture of upper end of left humerus, initial encounter for closed fracture: Secondary | ICD-10-CM | POA: Diagnosis not present

## 2014-11-21 DIAGNOSIS — S0990XA Unspecified injury of head, initial encounter: Secondary | ICD-10-CM | POA: Diagnosis not present

## 2014-11-21 DIAGNOSIS — R102 Pelvic and perineal pain: Secondary | ICD-10-CM | POA: Diagnosis not present

## 2014-11-21 DIAGNOSIS — Z8249 Family history of ischemic heart disease and other diseases of the circulatory system: Secondary | ICD-10-CM | POA: Diagnosis not present

## 2014-11-21 DIAGNOSIS — E871 Hypo-osmolality and hyponatremia: Secondary | ICD-10-CM | POA: Diagnosis present

## 2014-11-21 DIAGNOSIS — Z950 Presence of cardiac pacemaker: Secondary | ICD-10-CM | POA: Diagnosis not present

## 2014-11-21 DIAGNOSIS — T148 Other injury of unspecified body region: Secondary | ICD-10-CM | POA: Diagnosis not present

## 2014-11-21 DIAGNOSIS — M25512 Pain in left shoulder: Secondary | ICD-10-CM | POA: Diagnosis not present

## 2014-11-21 DIAGNOSIS — H3530 Unspecified macular degeneration: Secondary | ICD-10-CM | POA: Diagnosis present

## 2014-11-21 DIAGNOSIS — S42212A Unspecified displaced fracture of surgical neck of left humerus, initial encounter for closed fracture: Principal | ICD-10-CM | POA: Diagnosis present

## 2014-11-21 DIAGNOSIS — R55 Syncope and collapse: Secondary | ICD-10-CM

## 2014-11-21 DIAGNOSIS — E039 Hypothyroidism, unspecified: Secondary | ICD-10-CM | POA: Diagnosis present

## 2014-11-21 DIAGNOSIS — D649 Anemia, unspecified: Secondary | ICD-10-CM | POA: Diagnosis not present

## 2014-11-21 DIAGNOSIS — M353 Polymyalgia rheumatica: Secondary | ICD-10-CM | POA: Diagnosis present

## 2014-11-21 DIAGNOSIS — Z79899 Other long term (current) drug therapy: Secondary | ICD-10-CM

## 2014-11-21 DIAGNOSIS — S42292A Other displaced fracture of upper end of left humerus, initial encounter for closed fracture: Secondary | ICD-10-CM | POA: Diagnosis not present

## 2014-11-21 DIAGNOSIS — W19XXXA Unspecified fall, initial encounter: Secondary | ICD-10-CM | POA: Diagnosis not present

## 2014-11-21 DIAGNOSIS — Y92129 Unspecified place in nursing home as the place of occurrence of the external cause: Secondary | ICD-10-CM | POA: Diagnosis not present

## 2014-11-21 DIAGNOSIS — D72829 Elevated white blood cell count, unspecified: Secondary | ICD-10-CM | POA: Diagnosis not present

## 2014-11-21 DIAGNOSIS — S42209A Unspecified fracture of upper end of unspecified humerus, initial encounter for closed fracture: Secondary | ICD-10-CM | POA: Diagnosis present

## 2014-11-21 DIAGNOSIS — W19XXXD Unspecified fall, subsequent encounter: Secondary | ICD-10-CM | POA: Diagnosis not present

## 2014-11-21 DIAGNOSIS — R262 Difficulty in walking, not elsewhere classified: Secondary | ICD-10-CM

## 2014-11-21 DIAGNOSIS — S299XXA Unspecified injury of thorax, initial encounter: Secondary | ICD-10-CM | POA: Diagnosis not present

## 2014-11-21 DIAGNOSIS — S42201D Unspecified fracture of upper end of right humerus, subsequent encounter for fracture with routine healing: Secondary | ICD-10-CM | POA: Diagnosis not present

## 2014-11-21 DIAGNOSIS — T07XXXA Unspecified multiple injuries, initial encounter: Secondary | ICD-10-CM

## 2014-11-21 LAB — BASIC METABOLIC PANEL
Anion gap: 10 (ref 5–15)
BUN: 21 mg/dL — ABNORMAL HIGH (ref 6–20)
CALCIUM: 8.8 mg/dL — AB (ref 8.9–10.3)
CO2: 25 mmol/L (ref 22–32)
Chloride: 96 mmol/L — ABNORMAL LOW (ref 101–111)
Creatinine, Ser: 1.05 mg/dL — ABNORMAL HIGH (ref 0.44–1.00)
GFR, EST AFRICAN AMERICAN: 53 mL/min — AB (ref >60)
GFR, EST NON AFRICAN AMERICAN: 46 mL/min — AB (ref >60)
GLUCOSE: 132 mg/dL — AB (ref 65–99)
Potassium: 3.8 mmol/L (ref 3.5–5.1)
Sodium: 131 mmol/L — ABNORMAL LOW (ref 135–145)

## 2014-11-21 LAB — CBC WITH DIFFERENTIAL/PLATELET
BASOS ABS: 0 10*3/uL (ref 0.0–0.1)
BASOS PCT: 0 % (ref 0–1)
Eosinophils Absolute: 0 10*3/uL (ref 0.0–0.7)
Eosinophils Relative: 0 % (ref 0–5)
HCT: 32.2 % — ABNORMAL LOW (ref 36.0–46.0)
Hemoglobin: 11 g/dL — ABNORMAL LOW (ref 12.0–15.0)
Lymphocytes Relative: 9 % — ABNORMAL LOW (ref 12–46)
Lymphs Abs: 1.6 10*3/uL (ref 0.7–4.0)
MCH: 33.5 pg (ref 26.0–34.0)
MCHC: 34.2 g/dL (ref 30.0–36.0)
MCV: 98.2 fL (ref 78.0–100.0)
Monocytes Absolute: 1.3 10*3/uL — ABNORMAL HIGH (ref 0.1–1.0)
Monocytes Relative: 7 % (ref 3–12)
NEUTROS ABS: 15.4 10*3/uL — AB (ref 1.7–7.7)
NEUTROS PCT: 84 % — AB (ref 43–77)
PLATELETS: 280 10*3/uL (ref 150–400)
RBC: 3.28 MIL/uL — ABNORMAL LOW (ref 3.87–5.11)
RDW: 17.1 % — AB (ref 11.5–15.5)
WBC: 18.4 10*3/uL — ABNORMAL HIGH (ref 4.0–10.5)

## 2014-11-21 MED ORDER — SODIUM CHLORIDE 0.9 % IV SOLN
INTRAVENOUS | Status: AC
Start: 2014-11-22 — End: 2014-11-22
  Administered 2014-11-22: via INTRAVENOUS

## 2014-11-21 MED ORDER — FLUTICASONE PROPIONATE 50 MCG/ACT NA SUSP
2.0000 | Freq: Every day | NASAL | Status: DC
  Administered 2014-11-22 – 2014-11-23 (×2): 2 via NASAL
  Filled 2014-11-21: qty 16

## 2014-11-21 MED ORDER — MELATONIN 1 MG PO TABS: 1.0000 | ORAL_TABLET | Freq: Every day | ORAL | Status: DC

## 2014-11-21 MED ORDER — LEVOTHYROXINE SODIUM 112 MCG PO TABS
112.0000 ug | ORAL_TABLET | Freq: Every day | ORAL | Status: DC
  Administered 2014-11-22 – 2014-11-23 (×2): 112 ug via ORAL
  Filled 2014-11-21 (×2): qty 1

## 2014-11-21 MED ORDER — FENTANYL CITRATE (PF) 100 MCG/2ML IJ SOLN
50.0000 ug | Freq: Once | INTRAMUSCULAR | Status: AC
  Administered 2014-11-21: 50 ug via INTRAVENOUS
  Filled 2014-11-21: qty 2

## 2014-11-21 MED ORDER — PREDNISONE 10 MG PO TABS
10.0000 mg | ORAL_TABLET | Freq: Every day | ORAL | Status: DC
  Administered 2014-11-22 – 2014-11-23 (×2): 10 mg via ORAL
  Filled 2014-11-21 (×2): qty 1

## 2014-11-21 MED ORDER — TIZANIDINE HCL 4 MG PO TABS: 2.0000 mg | ORAL_TABLET | Freq: Three times a day (TID) | ORAL | Status: DC | PRN

## 2014-11-21 MED ORDER — ENOXAPARIN SODIUM 40 MG/0.4ML ~~LOC~~ SOLN
40.0000 mg | SUBCUTANEOUS | Status: DC
  Administered 2014-11-22 – 2014-11-23 (×2): 40 mg via SUBCUTANEOUS
  Filled 2014-11-21 (×2): qty 0.4

## 2014-11-21 MED ORDER — ASPIRIN 325 MG PO TABS
325.0000 mg | ORAL_TABLET | Freq: Every day | ORAL | Status: DC
  Administered 2014-11-22 – 2014-11-23 (×2): 325 mg via ORAL
  Filled 2014-11-21 (×2): qty 1

## 2014-11-21 MED ORDER — ACETAMINOPHEN 325 MG PO TABS: 650.0000 mg | ORAL_TABLET | Freq: Four times a day (QID) | ORAL | Status: DC | PRN

## 2014-11-21 MED ORDER — CYCLOSPORINE 0.05 % OP EMUL
1.0000 [drp] | Freq: Two times a day (BID) | OPHTHALMIC | Status: DC
  Administered 2014-11-22 – 2014-11-23 (×3): 1 [drp] via OPHTHALMIC
  Filled 2014-11-21 (×6): qty 1

## 2014-11-21 MED ORDER — SODIUM CHLORIDE 0.9 % IV BOLUS (SEPSIS)
1000.0000 mL | Freq: Once | INTRAVENOUS | Status: AC
  Administered 2014-11-21: 1000 mL via INTRAVENOUS

## 2014-11-21 MED ORDER — HYDROCODONE-ACETAMINOPHEN 5-325 MG PO TABS
1.0000 | ORAL_TABLET | Freq: Once | ORAL | Status: AC
  Administered 2014-11-21: 1 via ORAL
  Filled 2014-11-21: qty 1

## 2014-11-21 NOTE — ED Provider Notes (Signed)
CSN: [id number]     Arrival date & time 11/21/14  1825 History   First MD Initiated Contact with Patient 11/21/14 1827     Chief Complaint  Patient presents with  . Fall     (Consider location/radiation/quality/duration/timing/severity/associated sxs/prior Treatment) Patient is a 81 y.o. female presenting with fall. The history is provided by the patient.  Fall This is a new problem. The current episode started less than 1 hour ago. The problem occurs constantly. The problem has not changed since onset.Pertinent negatives include no chest pain and no abdominal pain. Nothing aggravates the symptoms. Nothing relieves the symptoms. She has tried nothing for the symptoms. The treatment provided no relief.    Past Medical History  Diagnosis Date  . Hypertension   . Hypothyroidism   . Bradycardia   . Anemia   . Diverticulosis   . External hemorrhoids   . Constipation   . Macular degeneration   . Seasonal allergies   . Osteoporosis   . Degenerative joint disease   . Palpitations   . Hyperlipidemia   . GERD (gastroesophageal reflux disease)   . Pacemaker   . Thyrotoxicosis without mention of goiter or other cause, without mention of thyrotoxic crisis or storm   . Goiter, unspecified    Past Surgical History  Procedure Laterality Date  . Thyroidectomy    . Tonsillectomy    . Colonoscopy  November 2011    External hemorrhoids, diverticulosis, anal papillae  . Insert / replace / remove pacemaker    . Vertebroplasty    . Femur im nail Left 12/29/2012    Procedure: INTRAMEDULLARY (IM) NAIL INTERTROCHANTRIC;  Surgeon: Melina Schools, MD;  Location: [place];  Service: Orthopedics;  Laterality: Left;  . Permanent pacemaker insertion N/A 06/07/2011    Procedure: PERMANENT PACEMAKER INSERTION;  Surgeon: Evans Lance, MD;  Location: [facility] CATH LAB;  Service: Cardiovascular;  Laterality: N/A;  . Colonoscopy N/A 04/01/2014    Procedure: COLONOSCOPY;  Surgeon: Rogene Houston, MD;  Location: [facility];  Service: Endoscopy;  Laterality: N/A;  930   Family History  Problem Relation Age of Onset  . Heart attack Mother     Deceased  . Cancer Father     Deceased, colon cancer age 4  . Cancer Brother     Deceased, throat and lung   History  Substance Use Topics  . Smoking status: Never Smoker   . Smokeless tobacco: Never Used  . Alcohol Use: No   OB History    No data available     Review of Systems  Cardiovascular: Negative for chest pain.  Gastrointestinal: Negative for abdominal pain.  All other systems reviewed and are negative.     Allergies  Tramadol and Sulfa antibiotics  Home Medications   Prior to Admission medications   Medication Sig Start Date End Date Taking? Authorizing Provider  amLODipine (NORVASC) 5 MG tablet Take 1 tablet by mouth daily. 08/11/14  Yes Historical Provider, MD  aspirin 325 MG tablet Take 325 mg by mouth daily.   Yes Historical Provider, MD  beta carotene w/minerals (OCUVITE) tablet Take 1 tablet by mouth 2 (two) times daily.   Yes Historical Provider, MD  cetirizine (ZYRTEC) 10 MG tablet Take 10 mg by mouth at bedtime.   Yes Historical Provider, MD  docusate sodium (COLACE) 100 MG capsule Take 100 mg by mouth at bedtime. 01/01/13  Yes Ripudeep Krystal Eaton, MD  fluticasone (FLONASE) 50 MCG/ACT nasal spray Place 2 sprays into  the nose daily. For allergies   Yes Historical Provider, MD  hydrALAZINE (APRESOLINE) 25 MG tablet Take 25 mg by mouth 2 (two) times daily.     Yes Historical Provider, MD  Iron-FA-B Cmp-C-Biot-Probiotic (FUSION PLUS PO) Take 1 capsule by mouth daily.   Yes Historical Provider, MD  levothyroxine (SYNTHROID, LEVOTHROID) 112 MCG tablet Take 112 mcg by mouth daily before breakfast.   Yes Historical Provider, MD  lisinopril (PRINIVIL,ZESTRIL) 20 MG tablet Take 20 mg by mouth 2 (two) times daily.    Yes Historical Provider, MD  Magnesium 250 MG TABS Take 1 tablet by mouth every evening.   Yes Historical Provider, MD   Melatonin 1 MG TABS Take 1 tablet by mouth at bedtime.   Yes Historical Provider, MD  Multiple Vitamins-Minerals (THERATRUM COMPLETE PO) Take 1 tablet by mouth 2 (two) times daily.   Yes Historical Provider, MD  omeprazole (PRILOSEC) 20 MG capsule Take 20 mg by mouth every morning.  12/12/12  Yes Historical Provider, MD  polyethylene glycol powder (GLYCOLAX/MIRALAX) powder Take 17 g by mouth daily. For constipation. *One capful to be mixed in water or juice*   Yes Historical Provider, MD  predniSONE (DELTASONE) 10 MG tablet Take 10 mg by mouth daily with breakfast. Taking for 1 month 09/17/14  Yes Historical Provider, MD  Probiotic Product (ALIGN) 4 MG CAPS Take 1 capsule by mouth daily.   Yes Historical Provider, MD  tiZANidine (ZANAFLEX) 2 MG tablet Take 2 mg by mouth every 8 (eight) hours as needed for muscle spasms.   Yes Historical Provider, MD  Wheat Dextrin (BENEFIBER DRINK MIX PO) Take by mouth daily. Mix 2 teaspoonfuls with water and drink daily.   Yes Historical Provider, MD  acetaminophen (TYLENOL) 325 MG tablet Take 650 mg by mouth every 6 (six) hours as needed for moderate pain or headache.     Historical Provider, MD  cycloSPORINE (RESTASIS) 0.05 % ophthalmic emulsion Place 1 drop into both eyes 2 (two) times daily.     Historical Provider, MD  loperamide (IMODIUM) 2 MG capsule Take 2 mg by mouth every 4 (four) hours as needed for diarrhea or loose stools.    Historical Provider, MD  sodium phosphate (FLEET) 7-19 GM/118ML ENEM Place 1 enema rectally every other day as needed (if patient has no BM for 2 days). 01/01/13   Ripudeep K Rai, MD   BP 187/79 mmHg  Pulse 87  Temp(Src) 97.8 F (36.6 C)  Resp 18  Ht 5' 5.5" (1.664 m)  Wt 147 lb (66.679 kg)  BMI 24.08 kg/m2  SpO2 98% Physical Exam  Constitutional: She is oriented to person, place, and time. She appears well-developed and well-nourished. No distress.  HENT:  Head: Normocephalic and atraumatic.  Eyes: Conjunctivae are  normal.  Neck: Neck supple. No tracheal deviation present.  Cardiovascular: Normal rate and regular rhythm.   Pulmonary/Chest: Effort normal. No respiratory distress.  Abdominal: Soft. She exhibits no distension.  Musculoskeletal:       Left shoulder: She exhibits decreased range of motion (2/2 pain), tenderness (over prox humerus) and swelling.       Left hip: She exhibits tenderness (laterally).  No shortening of left lower extremity, NVI distal to left upper extremity injury. Pain with left hip flexion  Neurological: She is alert and oriented to person, place, and time.  Skin: Skin is warm and dry.  Psychiatric: She has a normal mood and affect.    ED Course  Procedures (including critical care time)  Labs Review Labs Reviewed  CBC WITH DIFFERENTIAL/PLATELET  BASIC METABOLIC PANEL    Imaging Review Dg Chest 1 View  11/21/2014   CLINICAL DATA:  Fall.  EXAM: CHEST  1 VIEW  COMPARISON:  12/27/2012  FINDINGS: Left-sided pacemaker is intact and unchanged. Lungs are adequately inflated without consolidation, effusion or pneumothorax. Cardiomediastinal silhouette is within normal. Known compression fracture over the mid thoracic spine. Acute left humeral neck fracture. Remainder of the exam is unchanged.  IMPRESSION: No acute cardiopulmonary disease.  Known acute left humeral neck fracture.  Stable old mid thoracic spine compression fracture.   Electronically Signed   By: Marin Olp M.D.   On: 11/21/2014 19:38   Dg Pelvis 1-2 Views  11/21/2014   CLINICAL DATA:  Fall.  Pelvic pain.  EXAM: PELVIS - 1-2 VIEW  COMPARISON:  12/29/2012.  FINDINGS: LEFT hip gamma nail is present. The intertrochanteric fracture has healed. Calcified fibroid in the anatomic pelvis. Sacral arcades are obscured by overlying stool and bowel gas. Proximal RIGHT femur appears normal. RIGHT hip joint space appears preserved. The obturator rings appear intact bilaterally. Lower lumbar spondylosis.  IMPRESSION: No acute  osseous abnormality. LEFT hip gamma nail with healed intertrochanteric fracture.   Electronically Signed   By: Dereck Ligas M.D.   On: 11/21/2014 19:40   Dg Shoulder Left  11/21/2014   CLINICAL DATA:  Fall injuring left side with left shoulder pain.  EXAM: LEFT SHOULDER - 2+ VIEW  COMPARISON:  Chest x-ray 12/27/2012  FINDINGS: There is a displaced humeral neck fracture no evidence of dislocation. Mild degenerate change of the [facility] joint. Left-sided cardiac pacemaker is present. Calcified plaque over the aortic arch is present.  IMPRESSION: Mildly displaced humeral neck fracture.   Electronically Signed   By: Marin Olp M.D.   On: 11/21/2014 19:33   Dg Humerus Left  11/21/2014   CLINICAL DATA:  Fall.  Proximal LEFT humerus pain.  EXAM: LEFT HUMERUS - 2+ VIEW  COMPARISON:  None.  FINDINGS: Pacemaker power pack is evident in the LEFT upper chest. Moderate AC joint osteoarthritis with undersurface spurring.  Proximal LEFT humerus fracture is present. The fracture is comminuted and there is mild impaction. Diffuse osteopenia. Maximal radiographic displacement of proximal humerus fragments is 9 mm. This suggests a 1 part fracture.  IMPRESSION: Comminuted proximal LEFT humerus fracture. Radiographically this is a 1 part proximal humerus fracture.   Electronically Signed   By: Dereck Ligas M.D.   On: 11/21/2014 19:34   Dg Femur Min 2 Views Left  11/21/2014   CLINICAL DATA:  Fall.  EXAM: LEFT FEMUR 2 VIEWS  COMPARISON:  12/29/2012 and 12/27/2012  FINDINGS: Exam demonstrates diffuse decreased bone mineralization. There are minimal degenerative changes of the left hip. Left femoral hardware intact and unchanged. No evidence of acute fracture or dislocation. Chronic fragment just medial to the femoral neck.  IMPRESSION: No acute findings.   Electronically Signed   By: Marin Olp M.D.   On: 11/21/2014 19:36   I independently viewed and interpreted the above radiology studies and agree with radiologist  report.    EKG Interpretation None      MDM   Final diagnoses:  Fracture, humerus, proximal, left, closed, initial encounter  Multiple contusions  Unable to walk    81 year old female presents after a ground-level fall where she sustained an injury to her left shoulder and left hip. Plain films of the affected areas were ordered. No evidence of unstable pelvic fracture, head injury,  or abdominal injury with low energy mechanism.  The patient sustained a left humeral neck fracture which was minimally displaced, will be nonoperative and treated with a sling. She is followed by [facility] for her headache and I recommended follow-up with them routinely. She has no fracture to her periprosthetic femur and no other fractures noted on her pelvis plain films but is unable to ambulate in the emergency department following multiple doses of pain medication. She is living in an assisted living facility currently without skilled nursing capability so after discussion with the family she will be placed in observation for PT OT assessment and evaluation of care needs going forward given her recurrent fall risk. Hospitalist was consulted for admission and will see the patient in the emergency department.     Leo Grosser, MD 11/21/14 2130

## 2014-11-21 NOTE — ED Notes (Signed)
Update given to pt and family member at bedside, comfort measures provided,

## 2014-11-21 NOTE — ED Notes (Signed)
Pt states she was trying to help another resident at [facility] assisted living facility and fell onto left side. C/o left shoulder and left hip pain. Denies hitting head/loc. Denies neck/back pain. Pt alert and oriented x 4. nad noted.

## 2014-11-21 NOTE — H&P (Addendum)
History and Physical  MAHINA SALATINO NWG:[id number] DOB: 1923-02-11 DOA: 11/21/2014  Referring physician: EDP PCP: Delphina Cahill, MD   Chief Complaint: fall  HPI: REISHA WOS is a 81 y.o. female  With h/o bradycardia s/p pacemaker, htn, polymyalgia (recently diagnosed),is currently on prednisone taper at 10mg  po qd, she was brought to the ED due to fall while trying to help another resident at [facility]. Prior to this, she has some left hip /leg pain which she attributed to polymyalgia. She has been getting physical therapy every other day and has been ambulating with a walker for the past few weeks.  ED course: x ray no hip fracture, did show proximal humeral neck fracture, she received fentanyl and norco for pain and sling to left arm. cbc showed wbc of 18,  Bmp with mild hyponatremia sodium of 131, hospitalist called for admission.   When I went to evaluated the patient, patient was sitting in the chair, initially denies any significant discomfort, by the end of my evaluation, patient c/o feeling weak, and feeling she was going to pass out, she was laid down to bed, recheck blood pressure sbp in the 90's, she also c/o head discomfort, she did not report hitting her head, ns bolus given, Ct head stat ordered, she is to be admitted to stepdown.  She otherwise, denies chest pain, no sob, no fever, no dysuria, no diarrhea, no n/v.   Review of Systems:  Detail per HPI, Review of systems are otherwise negative  Past Medical History  Diagnosis Date  . Hypertension   . Hypothyroidism   . Bradycardia   . Anemia   . Diverticulosis   . External hemorrhoids   . Constipation   . Macular degeneration   . Seasonal allergies   . Osteoporosis   . Degenerative joint disease   . Palpitations   . Hyperlipidemia   . GERD (gastroesophageal reflux disease)   . Pacemaker   . Thyrotoxicosis without mention of goiter or other cause, without mention of thyrotoxic crisis or storm   . Goiter,  unspecified    Past Surgical History  Procedure Laterality Date  . Thyroidectomy    . Tonsillectomy    . Colonoscopy  November 2011    External hemorrhoids, diverticulosis, anal papillae  . Insert / replace / remove pacemaker    . Vertebroplasty    . Femur im nail Left 12/29/2012    Procedure: INTRAMEDULLARY (IM) NAIL INTERTROCHANTRIC;  Surgeon: Melina Schools, MD;  Location: [place];  Service: Orthopedics;  Laterality: Left;  . Permanent pacemaker insertion N/A 06/07/2011    Procedure: PERMANENT PACEMAKER INSERTION;  Surgeon: Evans Lance, MD;  Location: [facility] CATH LAB;  Service: Cardiovascular;  Laterality: N/A;  . Colonoscopy N/A 04/01/2014    Procedure: COLONOSCOPY;  Surgeon: Rogene Houston, MD;  Location: [facility];  Service: Endoscopy;  Laterality: N/A;  930   Social History:  reports that she has never smoked. She has never used smokeless tobacco. She reports that she does not drink alcohol or use illicit drugs. Patient lives at assisted living & is able to participate in activities of daily living independently with a walker  Allergies  Allergen Reactions  . Tramadol Itching and Nausea Only  . Sulfa Antibiotics Nausea And Vomiting    Family History  Problem Relation Age of Onset  . Heart attack Mother     Deceased  . Cancer Father     Deceased, colon cancer age 79  . Cancer Brother  Deceased, throat and lung      Prior to Admission medications   Medication Sig Start Date End Date Taking? Authorizing Provider  amLODipine (NORVASC) 5 MG tablet Take 1 tablet by mouth daily. 08/11/14  Yes Historical Provider, MD  aspirin 325 MG tablet Take 325 mg by mouth daily.   Yes Historical Provider, MD  beta carotene w/minerals (OCUVITE) tablet Take 1 tablet by mouth 2 (two) times daily.   Yes Historical Provider, MD  cetirizine (ZYRTEC) 10 MG tablet Take 10 mg by mouth at bedtime.   Yes Historical Provider, MD  docusate sodium (COLACE) 100 MG capsule Take 100 mg by mouth at  bedtime. 01/01/13  Yes Ripudeep Krystal Eaton, MD  fluticasone (FLONASE) 50 MCG/ACT nasal spray Place 2 sprays into the nose daily. For allergies   Yes Historical Provider, MD  hydrALAZINE (APRESOLINE) 25 MG tablet Take 25 mg by mouth 2 (two) times daily.     Yes Historical Provider, MD  Iron-FA-B Cmp-C-Biot-Probiotic (FUSION PLUS PO) Take 1 capsule by mouth daily.   Yes Historical Provider, MD  levothyroxine (SYNTHROID, LEVOTHROID) 112 MCG tablet Take 112 mcg by mouth daily before breakfast.   Yes Historical Provider, MD  lisinopril (PRINIVIL,ZESTRIL) 20 MG tablet Take 20 mg by mouth 2 (two) times daily.    Yes Historical Provider, MD  Magnesium 250 MG TABS Take 1 tablet by mouth every evening.   Yes Historical Provider, MD  Melatonin 1 MG TABS Take 1 tablet by mouth at bedtime.   Yes Historical Provider, MD  Multiple Vitamins-Minerals (THERATRUM COMPLETE PO) Take 1 tablet by mouth 2 (two) times daily.   Yes Historical Provider, MD  omeprazole (PRILOSEC) 20 MG capsule Take 20 mg by mouth every morning.  12/12/12  Yes Historical Provider, MD  polyethylene glycol powder (GLYCOLAX/MIRALAX) powder Take 17 g by mouth daily. For constipation. *One capful to be mixed in water or juice*   Yes Historical Provider, MD  predniSONE (DELTASONE) 10 MG tablet Take 10 mg by mouth daily with breakfast. Taking for 1 month 09/17/14  Yes Historical Provider, MD  Probiotic Product (ALIGN) 4 MG CAPS Take 1 capsule by mouth daily.   Yes Historical Provider, MD  tiZANidine (ZANAFLEX) 2 MG tablet Take 2 mg by mouth every 8 (eight) hours as needed for muscle spasms.   Yes Historical Provider, MD  Wheat Dextrin (BENEFIBER DRINK MIX PO) Take by mouth daily. Mix 2 teaspoonfuls with water and drink daily.   Yes Historical Provider, MD  acetaminophen (TYLENOL) 325 MG tablet Take 650 mg by mouth every 6 (six) hours as needed for moderate pain or headache.     Historical Provider, MD  cycloSPORINE (RESTASIS) 0.05 % ophthalmic emulsion Place 1  drop into both eyes 2 (two) times daily.     Historical Provider, MD  loperamide (IMODIUM) 2 MG capsule Take 2 mg by mouth every 4 (four) hours as needed for diarrhea or loose stools.    Historical Provider, MD  sodium phosphate (FLEET) 7-19 GM/118ML ENEM Place 1 enema rectally every other day as needed (if patient has no BM for 2 days). 01/01/13   Ripudeep Krystal Eaton, MD    Physical Exam: BP 124/49 mmHg  Pulse 81  Temp(Src) 97.8 F (36.6 C)  Resp 18  Ht 5' 5.5" (1.664 m)  Wt 66.679 kg (147 lb)  BMI 24.08 kg/m2  SpO2 92%  General:  Frail, AAOX4 Eyes: PERRL ENT: unremarkable Neck: supple, no JVD Cardiovascular: RRR Respiratory: CTABL Abdomen: soft/ND/ND, positive bowel  sounds Skin: no rash Musculoskeletal:  No edema, left arm in sling, good peripheral pulse Psychiatric: calm/cooperative Neurologic: no focal findings            Labs on Admission:  Basic Metabolic Panel:  Recent Labs Lab 11/21/14 2126  NA 131*  K 3.8  CL 96*  CO2 25  GLUCOSE 132*  BUN 21*  CREATININE 1.05*  CALCIUM 8.8*   Liver Function Tests: No results for input(s): AST, ALT, ALKPHOS, BILITOT, PROT, ALBUMIN in the last 168 hours. No results for input(s): LIPASE, AMYLASE in the last 168 hours. No results for input(s): AMMONIA in the last 168 hours. CBC:  Recent Labs Lab 11/21/14 2126  WBC 18.4*  NEUTROABS 15.4*  HGB 11.0*  HCT 32.2*  MCV 98.2  PLT 280   Cardiac Enzymes: No results for input(s): CKTOTAL, CKMB, CKMBINDEX, TROPONINI in the last 168 hours.  BNP (last 3 results) No results for input(s): BNP in the last 8760 hours.  ProBNP (last 3 results) No results for input(s): PROBNP in the last 8760 hours.  CBG: No results for input(s): GLUCAP in the last 168 hours.  Radiological Exams on Admission: Dg Chest 1 View  11/21/2014   CLINICAL DATA:  Fall.  EXAM: CHEST  1 VIEW  COMPARISON:  12/27/2012  FINDINGS: Left-sided pacemaker is intact and unchanged. Lungs are adequately inflated  without consolidation, effusion or pneumothorax. Cardiomediastinal silhouette is within normal. Known compression fracture over the mid thoracic spine. Acute left humeral neck fracture. Remainder of the exam is unchanged.  IMPRESSION: No acute cardiopulmonary disease.  Known acute left humeral neck fracture.  Stable old mid thoracic spine compression fracture.   Electronically Signed   By: Marin Olp M.D.   On: 11/21/2014 19:38   Dg Pelvis 1-2 Views  11/21/2014   CLINICAL DATA:  Fall.  Pelvic pain.  EXAM: PELVIS - 1-2 VIEW  COMPARISON:  12/29/2012.  FINDINGS: LEFT hip gamma nail is present. The intertrochanteric fracture has healed. Calcified fibroid in the anatomic pelvis. Sacral arcades are obscured by overlying stool and bowel gas. Proximal RIGHT femur appears normal. RIGHT hip joint space appears preserved. The obturator rings appear intact bilaterally. Lower lumbar spondylosis.  IMPRESSION: No acute osseous abnormality. LEFT hip gamma nail with healed intertrochanteric fracture.   Electronically Signed   By: Dereck Ligas M.D.   On: 11/21/2014 19:40   Dg Shoulder Left  11/21/2014   CLINICAL DATA:  Fall injuring left side with left shoulder pain.  EXAM: LEFT SHOULDER - 2+ VIEW  COMPARISON:  Chest x-ray 12/27/2012  FINDINGS: There is a displaced humeral neck fracture no evidence of dislocation. Mild degenerate change of the [facility] joint. Left-sided cardiac pacemaker is present. Calcified plaque over the aortic arch is present.  IMPRESSION: Mildly displaced humeral neck fracture.   Electronically Signed   By: Marin Olp M.D.   On: 11/21/2014 19:33   Dg Humerus Left  11/21/2014   CLINICAL DATA:  Fall.  Proximal LEFT humerus pain.  EXAM: LEFT HUMERUS - 2+ VIEW  COMPARISON:  None.  FINDINGS: Pacemaker power pack is evident in the LEFT upper chest. Moderate AC joint osteoarthritis with undersurface spurring.  Proximal LEFT humerus fracture is present. The fracture is comminuted and there is mild  impaction. Diffuse osteopenia. Maximal radiographic displacement of proximal humerus fragments is 9 mm. This suggests a 1 part fracture.  IMPRESSION: Comminuted proximal LEFT humerus fracture. Radiographically this is a 1 part proximal humerus fracture.   Electronically Signed  By: Dereck Ligas M.D.   On: 11/21/2014 19:34   Dg Femur Min 2 Views Left  11/21/2014   CLINICAL DATA:  Fall.  EXAM: LEFT FEMUR 2 VIEWS  COMPARISON:  12/29/2012 and 12/27/2012  FINDINGS: Exam demonstrates diffuse decreased bone mineralization. There are minimal degenerative changes of the left hip. Left femoral hardware intact and unchanged. No evidence of acute fracture or dislocation. Chronic fragment just medial to the femoral neck.  IMPRESSION: No acute findings.   Electronically Signed   By: Marin Olp M.D.   On: 11/21/2014 19:36      Assessment/Plan Present on Admission:  . Fracture, humerus, proximal . Fall  Presyncope: likely from dehydration, CT head pending, hold all bp meds, continue ivf. Admit to stepdown. ( addendum: CT head negative, bp better after fluids bolus, admit status changed to tele bed)  Left femur fracture, in sling, patient denies significant pain from left arm, ortho will be consulted in AM.  Leukocytosis: likely from steroid use, will check ua.  Mild hyponatremia: likely from dehydration, clinically dry, hydration, repeat bmp in am.  H/o polymyalgia: continue home dose prednisone.   FTT/Fall: likely accidental fall. But currently left hip pain, left arm in sling, will need PT/OT and likely SNF, this was discussed with patient and son.  DVT prophylaxis: lovenox subQ 40mg   Consultants: ortho in am  Code Status: full , confirmed with patient and son who is currently in room  Family Communication:  Patient  and son  Disposition Plan: admit to tele  Time spent: 20mins  Zaharah Amir MD, PhD [facility] Pager [phone] If 7PM-7AM, please contact night-coverage at  [email or web address], password [facility]

## 2014-11-22 ENCOUNTER — Encounter (HOSPITAL_COMMUNITY): Payer: Self-pay | Admitting: *Deleted

## 2014-11-22 DIAGNOSIS — E860 Dehydration: Secondary | ICD-10-CM | POA: Diagnosis present

## 2014-11-22 DIAGNOSIS — D649 Anemia, unspecified: Secondary | ICD-10-CM | POA: Diagnosis present

## 2014-11-22 DIAGNOSIS — S42201D Unspecified fracture of upper end of right humerus, subsequent encounter for fracture with routine healing: Secondary | ICD-10-CM

## 2014-11-22 DIAGNOSIS — E871 Hypo-osmolality and hyponatremia: Secondary | ICD-10-CM

## 2014-11-22 DIAGNOSIS — W19XXXD Unspecified fall, subsequent encounter: Secondary | ICD-10-CM

## 2014-11-22 LAB — URINALYSIS, ROUTINE W REFLEX MICROSCOPIC
BILIRUBIN URINE: NEGATIVE
GLUCOSE, UA: NEGATIVE mg/dL
HGB URINE DIPSTICK: NEGATIVE
KETONES UR: NEGATIVE mg/dL
Leukocytes, UA: NEGATIVE
Nitrite: NEGATIVE
Protein, ur: NEGATIVE mg/dL
Specific Gravity, Urine: 1.015 (ref 1.005–1.030)
Urobilinogen, UA: 0.2 mg/dL (ref 0.0–1.0)
pH: 7 (ref 5.0–8.0)

## 2014-11-22 LAB — CBC
HCT: 26.9 % — ABNORMAL LOW (ref 36.0–46.0)
Hemoglobin: 9.3 g/dL — ABNORMAL LOW (ref 12.0–15.0)
MCH: 34.2 pg — AB (ref 26.0–34.0)
MCHC: 34.6 g/dL (ref 30.0–36.0)
MCV: 98.9 fL (ref 78.0–100.0)
PLATELETS: 219 10*3/uL (ref 150–400)
RBC: 2.72 MIL/uL — ABNORMAL LOW (ref 3.87–5.11)
RDW: 17 % — ABNORMAL HIGH (ref 11.5–15.5)
WBC: 10 10*3/uL (ref 4.0–10.5)

## 2014-11-22 LAB — IRON AND TIBC
IRON: 28 ug/dL (ref 28–170)
Saturation Ratios: 13 % (ref 10.4–31.8)
TIBC: 220 ug/dL — ABNORMAL LOW (ref 250–450)
UIBC: 192 ug/dL

## 2014-11-22 LAB — RETICULOCYTES
RBC.: 2.79 MIL/uL — AB (ref 3.87–5.11)
RETIC CT PCT: 4.4 % — AB (ref 0.4–3.1)
Retic Count, Absolute: 122.8 10*3/uL (ref 19.0–186.0)

## 2014-11-22 LAB — PROTIME-INR
INR: 1.22 (ref 0.00–1.49)
PROTHROMBIN TIME: 15.6 s — AB (ref 11.6–15.2)

## 2014-11-22 LAB — VITAMIN B12: Vitamin B-12: 853 pg/mL (ref 180–914)

## 2014-11-22 LAB — COMPREHENSIVE METABOLIC PANEL
ALT: 19 U/L (ref 14–54)
ANION GAP: 10 (ref 5–15)
AST: 20 U/L (ref 15–41)
Albumin: 3.5 g/dL (ref 3.5–5.0)
Alkaline Phosphatase: 51 U/L (ref 38–126)
BUN: 19 mg/dL (ref 6–20)
CHLORIDE: 98 mmol/L — AB (ref 101–111)
CO2: 25 mmol/L (ref 22–32)
Calcium: 8.1 mg/dL — ABNORMAL LOW (ref 8.9–10.3)
Creatinine, Ser: 0.87 mg/dL (ref 0.44–1.00)
GFR calc Af Amer: >60 mL/min (ref >60)
GFR calc non Af Amer: 57 mL/min — ABNORMAL LOW (ref >60)
Glucose, Bld: 110 mg/dL — ABNORMAL HIGH (ref 65–99)
Potassium: 3.8 mmol/L (ref 3.5–5.1)
SODIUM: 133 mmol/L — AB (ref 135–145)
Total Bilirubin: 0.6 mg/dL (ref 0.3–1.2)
Total Protein: 5.4 g/dL — ABNORMAL LOW (ref 6.5–8.1)

## 2014-11-22 LAB — FERRITIN: Ferritin: 509 ng/mL — ABNORMAL HIGH (ref 11–307)

## 2014-11-22 LAB — MRSA PCR SCREENING: MRSA by PCR: NEGATIVE

## 2014-11-22 LAB — TSH: TSH: 0.295 u[IU]/mL — AB (ref 0.350–4.500)

## 2014-11-22 MED ORDER — HYDROCODONE-ACETAMINOPHEN 5-325 MG PO TABS
1.0000 | ORAL_TABLET | ORAL | Status: DC | PRN
  Administered 2014-11-22 – 2014-11-23 (×4): 1 via ORAL
  Filled 2014-11-22 (×4): qty 1

## 2014-11-22 MED ORDER — ALPRAZOLAM 0.25 MG PO TABS
0.2500 mg | ORAL_TABLET | Freq: Every evening | ORAL | Status: DC | PRN
  Administered 2014-11-22: 0.25 mg via ORAL
  Filled 2014-11-22 (×2): qty 1

## 2014-11-22 MED ORDER — METHOCARBAMOL 500 MG PO TABS
500.0000 mg | ORAL_TABLET | Freq: Four times a day (QID) | ORAL | Status: DC | PRN
  Administered 2014-11-22 – 2014-11-23 (×5): 500 mg via ORAL
  Filled 2014-11-22 (×5): qty 1

## 2014-11-22 MED ORDER — HYDROCODONE-ACETAMINOPHEN 5-325 MG PO TABS
1.0000 | ORAL_TABLET | Freq: Four times a day (QID) | ORAL | Status: DC | PRN
  Administered 2014-11-22 (×2): 1 via ORAL
  Filled 2014-11-22 (×3): qty 1

## 2014-11-22 NOTE — Progress Notes (Signed)
Notified MD that pt is still complaining of pain. She also requested something to help her sleep and to help "calm her nerves". Awaiting MD's response.

## 2014-11-22 NOTE — Progress Notes (Signed)
[facility] PROGRESS NOTE  [name] XLK:[id number] DOB: 1922-06-01 DOA: 11/21/2014 PCP: Delphina Cahill, MD  Assessment/Plan:  Fracture, humerus, left.  proximal: due to mechanical fall. Sling in place. Fingers to left hand warm. Pain controlled. Await ortho recommendations.  Active Problems: Anemia: currently Hg 9.3 which is close to baseline. Down from 11.0 on admission. Likely dilutional. No s/sx bleeding. Hx anemia. Anemia panel.     Hypothyroidism: will check TSH    Hyponatremia: likely related to decreased po intake. Improving with IV fluid.   Left leg pain: xray without acute fx. Patient complained "spasm" will request PT evaluation. Provide robaxin. monitor       Code Status: full Family Communication: none present Disposition Plan: back to facility   Consultants:  orthopedics  Procedures:  none  Antibiotics:  none  HPI/Subjective: Awake. Reports pain Left leg with movement.  Objective: Filed Vitals:   11/21/14 2358  BP: 183/67  Pulse: 97  Temp: 98 F (36.7 C)  Resp: 20   No intake or output data in the 24 hours ending 11/22/14 1303 Filed Weights   11/21/14 1829 11/21/14 2358  Weight: 66.679 kg (147 lb) 67.8 kg (149 lb 7.6 oz)    Exam:   General:  Well nourished somewhat pale  Cardiovascular: RRR no MGR no LE edema  Respiratory: normal effort BS clear biaterally no wheeze  Abdomen: soft +BS non-tender  Musculoskeletal: left arm in sling. Otherwise joints without swelling/erythema. Left leg with full ROM at hip and knee   Data Reviewed: Basic Metabolic Panel:  Recent Labs Lab 11/21/14 2126 11/22/14 0507  NA 131* 133*  K 3.8 3.8  CL 96* 98*  CO2 25 25  GLUCOSE 132* 110*  BUN 21* 19  CREATININE 1.05* 0.87  CALCIUM 8.8* 8.1*   Liver Function Tests:  Recent Labs Lab 11/22/14 0507  AST 20  ALT 19  ALKPHOS 51  BILITOT 0.6  PROT 5.4*  ALBUMIN 3.5   No results for input(s): LIPASE, AMYLASE in the last 168 hours. No  results for input(s): AMMONIA in the last 168 hours. CBC:  Recent Labs Lab 11/21/14 2126 11/22/14 0507  WBC 18.4* 10.0  NEUTROABS 15.4*  --   HGB 11.0* 9.3*  HCT 32.2* 26.9*  MCV 98.2 98.9  PLT 280 219   Cardiac Enzymes: No results for input(s): CKTOTAL, CKMB, CKMBINDEX, TROPONINI in the last 168 hours. BNP (last 3 results) No results for input(s): BNP in the last 8760 hours.  ProBNP (last 3 results) No results for input(s): PROBNP in the last 8760 hours.  CBG: No results for input(s): GLUCAP in the last 168 hours.  Recent Results (from the past 240 hour(s))  MRSA PCR Screening     Status: None   Collection Time: 11/22/14 12:45 AM  Result Value Ref Range Status   MRSA by PCR NEGATIVE NEGATIVE Final    Comment:        The GeneXpert MRSA Assay (FDA approved for NASAL specimens only), is one component of a comprehensive MRSA colonization surveillance program. It is not intended to diagnose MRSA infection nor to guide or monitor treatment for MRSA infections.      Studies: Dg Chest 1 View  11/21/2014   CLINICAL DATA:  Fall.  EXAM: CHEST  1 VIEW  COMPARISON:  12/27/2012  FINDINGS: Left-sided pacemaker is intact and unchanged. Lungs are adequately inflated without consolidation, effusion or pneumothorax. Cardiomediastinal silhouette is within normal. Known compression fracture over the mid thoracic spine. Acute left humeral  neck fracture. Remainder of the exam is unchanged.  IMPRESSION: No acute cardiopulmonary disease.  Known acute left humeral neck fracture.  Stable old mid thoracic spine compression fracture.   Electronically Signed   By: Marin Olp M.D.   On: 11/21/2014 19:38   Dg Pelvis 1-2 Views  11/21/2014   CLINICAL DATA:  Fall.  Pelvic pain.  EXAM: PELVIS - 1-2 VIEW  COMPARISON:  12/29/2012.  FINDINGS: LEFT hip gamma nail is present. The intertrochanteric fracture has healed. Calcified fibroid in the anatomic pelvis. Sacral arcades are obscured by overlying stool  and bowel gas. Proximal RIGHT femur appears normal. RIGHT hip joint space appears preserved. The obturator rings appear intact bilaterally. Lower lumbar spondylosis.  IMPRESSION: No acute osseous abnormality. LEFT hip gamma nail with healed intertrochanteric fracture.   Electronically Signed   By: Dereck Ligas M.D.   On: 11/21/2014 19:40   Ct Head Wo Contrast  11/21/2014   CLINICAL DATA:  [place], landing on left side.  EXAM: CT HEAD WITHOUT CONTRAST  TECHNIQUE: Contiguous axial images were obtained from the base of the skull through the vertex without intravenous contrast.  COMPARISON:  None.  FINDINGS: There is no intracranial hemorrhage, mass or evidence of acute infarction. There is no extra-axial fluid collection. Gray matter and white matter appear normal. Cerebral volume is normal for age. Brainstem and posterior fossa are unremarkable. The CSF spaces appear normal.  The bony structures are intact. 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 Remainder of the visible paranasal sinuses are clear.  IMPRESSION: Normal brain.  No acute findings.   Electronically Signed   By: Andreas Newport M.D.   On: 11/21/2014 22:38   Dg Shoulder Left  11/21/2014   CLINICAL DATA:  Fall injuring left side with left shoulder pain.  EXAM: LEFT SHOULDER - 2+ VIEW  COMPARISON:  Chest x-ray 12/27/2012  FINDINGS: There is a displaced humeral neck fracture no evidence of dislocation. Mild degenerate change of the [facility] joint. Left-sided cardiac pacemaker is present. Calcified plaque over the aortic arch is present.  IMPRESSION: Mildly displaced humeral neck fracture.   Electronically Signed   By: Marin Olp M.D.   On: 11/21/2014 19:33   Dg Humerus Left  11/21/2014   CLINICAL DATA:  Fall.  Proximal LEFT humerus pain.  EXAM: LEFT HUMERUS - 2+ VIEW  COMPARISON:  None.  FINDINGS: Pacemaker power pack is evident in the LEFT upper chest. Moderate AC joint osteoarthritis with undersurface spurring.  Proximal LEFT humerus  fracture is present. The fracture is comminuted and there is mild impaction. Diffuse osteopenia. Maximal radiographic displacement of proximal humerus fragments is 9 mm. This suggests a 1 part fracture.  IMPRESSION: Comminuted proximal LEFT humerus fracture. Radiographically this is a 1 part proximal humerus fracture.   Electronically Signed   By: Dereck Ligas M.D.   On: 11/21/2014 19:34   Dg Femur Min 2 Views Left  11/21/2014   CLINICAL DATA:  Fall.  EXAM: LEFT FEMUR 2 VIEWS  COMPARISON:  12/29/2012 and 12/27/2012  FINDINGS: Exam demonstrates diffuse decreased bone mineralization. There are minimal degenerative changes of the left hip. Left femoral hardware intact and unchanged. No evidence of acute fracture or dislocation. Chronic fragment just medial to the femoral neck.  IMPRESSION: No acute findings.   Electronically Signed   By: Marin Olp M.D.   On: 11/21/2014 19:36    Scheduled Meds: . aspirin  325 mg Oral Daily  . cycloSPORINE  1 drop Both Eyes BID  .  enoxaparin (LOVENOX) injection  40 mg Subcutaneous Q24H  . fluticasone  2 spray Each Nare Daily  . levothyroxine  112 mcg Oral QAC breakfast  . predniSONE  10 mg Oral Q breakfast   Continuous Infusions: . sodium chloride 50 mL/hr at 11/22/14 1140    Principal Problem:    Time spent: [facility] Pager [phone]. If 7PM-7AM, please contact night-coverage at [email or web address], password [facility] 11/22/2014, 1:03 PM  LOS: 1 day

## 2014-11-22 NOTE — Care Management Note (Signed)
Case Management Note  Patient Details  Name: [name] MRN: [id number] Date of Birth: 02-20-1923  Subjective/Objective:                  Pt admitted from [facility] [place] with falls and fractured humerus/shoulder. Pt will return to facility at discharge.   Action/Plan: Will continue to follow for discharge planning needs. Pt will need hospital bed and w/c and CM will arrange prior to discharge.  Expected Discharge Date:    11/23/14              Expected Discharge Plan:  Assisted Living / Rest Home  In-House Referral:  Clinical Social Work  Discharge planning Services  CM Consult  Post Acute Care Choice:  Durable Medical Equipment Choice offered to:  Adult Children, Patient  DME Arranged:  Hospital bed, Wheelchair manual DME Agency:  [facility]  HH Arranged:    [place] Agency:     Status of Service:  Completed, signed off  Medicare Important Message Given:    Date Medicare IM Given:    Medicare IM give by:    Date Additional Medicare IM Given:    Additional Medicare Important Message give by:     If discussed at [place] of Stay Meetings, dates discussed:    Additional Comments:  Joylene Draft, RN 11/22/2014, 2:32 PM

## 2014-11-22 NOTE — Consult Note (Signed)
Reason for Consult:Fracture of the left proximal humerus Referring Physician: Hospitalist  [name] is an 81 y.o. female.  HPI: She is a resident at local rest home.  She fell and hurt her left shoulder yesterday.  She has acute fracture of the left proximal humerus.  Other x-rays were negative.  She is post left hip fracture and gamma nail fixation.  No new fracture was noted here.  She has history of osteoporosis and also multiple arthralgia and myofascitis.  She is resting well with shoulder immobilizer on.  I have talked to her and her son.  She will need hospital bed at rest home with elevation at night to sleep, continue the shoulder immobilizer. It will take about six to eight weeks to heal.  I do not expect any surgery for the shoulder.  She will need PT for the shoulder near the end of healing.  Therapy has seen her today and she is unsteady on her feet.  Therapy recommends bed to chair to bed tranfers for now, possible wheelchair at rest home.  I will see her in my office in two weeks.  X-rays then.  Continue shoulder immobilizer.  Past Medical History  Diagnosis Date  . Hypertension   . Hypothyroidism   . Bradycardia   . Anemia   . Diverticulosis   . External hemorrhoids   . Constipation   . Macular degeneration   . Seasonal allergies   . Osteoporosis   . Degenerative joint disease   . Palpitations   . Hyperlipidemia   . GERD (gastroesophageal reflux disease)   . Pacemaker   . Thyrotoxicosis without mention of goiter or other cause, without mention of thyrotoxic crisis or storm   . Goiter, unspecified     Past Surgical History  Procedure Laterality Date  . Thyroidectomy    . Tonsillectomy    . Colonoscopy  November 2011    External hemorrhoids, diverticulosis, anal papillae  . Insert / replace / remove pacemaker    . Vertebroplasty    . Femur im nail Left 12/29/2012    Procedure: INTRAMEDULLARY (IM) NAIL INTERTROCHANTRIC;  Surgeon: Melina Schools, MD;  Location:  [facility];  Service: Orthopedics;  Laterality: Left;  . Permanent pacemaker insertion N/A 06/07/2011    Procedure: PERMANENT PACEMAKER INSERTION;  Surgeon: Evans Lance, MD;  Location: [facility] CATH LAB;  Service: Cardiovascular;  Laterality: N/A;  . Colonoscopy N/A 04/01/2014    Procedure: COLONOSCOPY;  Surgeon: Rogene Houston, MD;  Location: [facility];  Service: Endoscopy;  Laterality: N/A;  930    Family History  Problem Relation Age of Onset  . Heart attack Mother     Deceased  . Cancer Father     Deceased, colon cancer age 85  . Cancer Brother     Deceased, throat and lung    Social History:  reports that she has never smoked. She has never used smokeless tobacco. She reports that she does not drink alcohol or use illicit drugs.  Allergies:  Allergies  Allergen Reactions  . Tramadol Itching and Nausea Only  . Sulfa Antibiotics Nausea And Vomiting    Medications: I have reviewed the patient's current medications.  Results for orders placed or performed during the hospital encounter of 11/21/14 (from the past 48 hour(s))  CBC with Differential/Platelet     Status: Abnormal   Collection Time: 11/21/14  9:26 PM  Result Value Ref Range   WBC 18.4 (H) 4.0 - 10.5 K/uL   RBC  3.28 (L) 3.87 - 5.11 MIL/uL   Hemoglobin 11.0 (L) 12.0 - 15.0 g/dL   HCT 32.2 (L) 36.0 - 46.0 %   MCV 98.2 78.0 - 100.0 fL   MCH 33.5 26.0 - 34.0 pg   MCHC 34.2 30.0 - 36.0 g/dL   RDW 17.1 (H) 11.5 - 15.5 %   Platelets 280 150 - 400 K/uL   Neutrophils Relative % 84 (H) 43 - 77 %   Neutro Abs 15.4 (H) 1.7 - 7.7 K/uL   Lymphocytes Relative 9 (L) 12 - 46 %   Lymphs Abs 1.6 0.7 - 4.0 K/uL   Monocytes Relative 7 3 - 12 %   Monocytes Absolute 1.3 (H) 0.1 - 1.0 K/uL   Eosinophils Relative 0 0 - 5 %   Eosinophils Absolute 0.0 0.0 - 0.7 K/uL   Basophils Relative 0 0 - 1 %   Basophils Absolute 0.0 0.0 - 0.1 K/uL  Basic metabolic panel     Status: Abnormal   Collection Time: 11/21/14  9:26 PM  Result Value Ref  Range   Sodium 131 (L) 135 - 145 mmol/L   Potassium 3.8 3.5 - 5.1 mmol/L   Chloride 96 (L) 101 - 111 mmol/L   CO2 25 22 - 32 mmol/L   Glucose, Bld 132 (H) 65 - 99 mg/dL   BUN 21 (H) 6 - 20 mg/dL   Creatinine, Ser 1.05 (H) 0.44 - 1.00 mg/dL   Calcium 8.8 (L) 8.9 - 10.3 mg/dL   GFR calc non Af Amer 46 (L) >60 mL/min   GFR calc Af Amer 53 (L) >60 mL/min    Comment: (NOTE) The eGFR has been calculated using the CKD EPI equation. This calculation has not been validated in all clinical situations. eGFR's persistently <60 mL/min signify possible Chronic Kidney Disease.    Anion gap 10 5 - 15  Urinalysis, Routine w reflex microscopic (not at [facility])     Status: None   Collection Time: 11/22/14 12:25 AM  Result Value Ref Range   Color, Urine YELLOW YELLOW   APPearance CLEAR CLEAR   Specific Gravity, Urine 1.015 1.005 - 1.030   pH 7.0 5.0 - 8.0   Glucose, UA NEGATIVE NEGATIVE mg/dL   Hgb urine dipstick NEGATIVE NEGATIVE   Bilirubin Urine NEGATIVE NEGATIVE   Ketones, ur NEGATIVE NEGATIVE mg/dL   Protein, ur NEGATIVE NEGATIVE mg/dL   Urobilinogen, UA 0.2 0.0 - 1.0 mg/dL   Nitrite NEGATIVE NEGATIVE   Leukocytes, UA NEGATIVE NEGATIVE    Comment: MICROSCOPIC NOT DONE ON URINES WITH NEGATIVE PROTEIN, BLOOD, LEUKOCYTES, NITRITE, OR GLUCOSE <1000 mg/dL.  MRSA PCR Screening     Status: None   Collection Time: 11/22/14 12:45 AM  Result Value Ref Range   MRSA by PCR NEGATIVE NEGATIVE    Comment:        The GeneXpert MRSA Assay (FDA approved for NASAL specimens only), is one component of a comprehensive MRSA colonization surveillance program. It is not intended to diagnose MRSA infection nor to guide or monitor treatment for MRSA infections.   Protime-INR     Status: Abnormal   Collection Time: 11/22/14  5:07 AM  Result Value Ref Range   Prothrombin Time 15.6 (H) 11.6 - 15.2 seconds   INR 1.22 0.00 - 1.49  CBC     Status: Abnormal   Collection Time: 11/22/14  5:07 AM  Result Value Ref  Range   WBC 10.0 4.0 - 10.5 K/uL   RBC 2.72 (L) 3.87 -  5.11 MIL/uL   Hemoglobin 9.3 (L) 12.0 - 15.0 g/dL   HCT 26.9 (L) 36.0 - 46.0 %   MCV 98.9 78.0 - 100.0 fL   MCH 34.2 (H) 26.0 - 34.0 pg   MCHC 34.6 30.0 - 36.0 g/dL   RDW 17.0 (H) 11.5 - 15.5 %   Platelets 219 150 - 400 K/uL  Comprehensive metabolic panel     Status: Abnormal   Collection Time: 11/22/14  5:07 AM  Result Value Ref Range   Sodium 133 (L) 135 - 145 mmol/L   Potassium 3.8 3.5 - 5.1 mmol/L   Chloride 98 (L) 101 - 111 mmol/L   CO2 25 22 - 32 mmol/L   Glucose, Bld 110 (H) 65 - 99 mg/dL   BUN 19 6 - 20 mg/dL   Creatinine, Ser 0.87 0.44 - 1.00 mg/dL   Calcium 8.1 (L) 8.9 - 10.3 mg/dL   Total Protein 5.4 (L) 6.5 - 8.1 g/dL   Albumin 3.5 3.5 - 5.0 g/dL   AST 20 15 - 41 U/L   ALT 19 14 - 54 U/L   Alkaline Phosphatase 51 38 - 126 U/L   Total Bilirubin 0.6 0.3 - 1.2 mg/dL   GFR calc non Af Amer 57 (L) >60 mL/min   GFR calc Af Amer >60 >60 mL/min    Comment: (NOTE) The eGFR has been calculated using the CKD EPI equation. This calculation has not been validated in all clinical situations. eGFR's persistently <60 mL/min signify possible Chronic Kidney Disease.    Anion gap 10 5 - 15  Reticulocytes     Status: Abnormal   Collection Time: 11/22/14  5:07 AM  Result Value Ref Range   Retic Ct Pct 4.4 (H) 0.4 - 3.1 %   RBC. 2.79 (L) 3.87 - 5.11 MIL/uL   Retic Count, Manual 122.8 19.0 - 186.0 K/uL    Dg Chest 1 View  11/21/2014   CLINICAL DATA:  Fall.  EXAM: CHEST  1 VIEW  COMPARISON:  12/27/2012  FINDINGS: Left-sided pacemaker is intact and unchanged. Lungs are adequately inflated without consolidation, effusion or pneumothorax. Cardiomediastinal silhouette is within normal. Known compression fracture over the mid thoracic spine. Acute left humeral neck fracture. Remainder of the exam is unchanged.  IMPRESSION: No acute cardiopulmonary disease.  Known acute left humeral neck fracture.  Stable old mid thoracic spine  compression fracture.   Electronically Signed   By: Marin Olp M.D.   On: 11/21/2014 19:38   Dg Pelvis 1-2 Views  11/21/2014   CLINICAL DATA:  Fall.  Pelvic pain.  EXAM: PELVIS - 1-2 VIEW  COMPARISON:  12/29/2012.  FINDINGS: LEFT hip gamma nail is present. The intertrochanteric fracture has healed. Calcified fibroid in the anatomic pelvis. Sacral arcades are obscured by overlying stool and bowel gas. Proximal RIGHT femur appears normal. RIGHT hip joint space appears preserved. The obturator rings appear intact bilaterally. Lower lumbar spondylosis.  IMPRESSION: No acute osseous abnormality. LEFT hip gamma nail with healed intertrochanteric fracture.   Electronically Signed   By: Dereck Ligas M.D.   On: 11/21/2014 19:40   Ct Head Wo Contrast  11/21/2014   CLINICAL DATA:  [place], landing on left side.  EXAM: CT HEAD WITHOUT CONTRAST  TECHNIQUE: Contiguous axial images were obtained from the base of the skull through the vertex without intravenous contrast.  COMPARISON:  None.  FINDINGS: There is no intracranial hemorrhage, mass or evidence of acute infarction. There is no extra-axial fluid collection. [place] matter and  white matter appear normal. Cerebral volume is normal for age. Brainstem and posterior fossa are unremarkable. The CSF spaces appear normal.  The bony structures are intact. 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 Remainder of the visible paranasal sinuses are clear.  IMPRESSION: Normal brain.  No acute findings.   Electronically Signed   By: Andreas Newport M.D.   On: 11/21/2014 22:38   Dg Shoulder Left  11/21/2014   CLINICAL DATA:  Fall injuring left side with left shoulder pain.  EXAM: LEFT SHOULDER - 2+ VIEW  COMPARISON:  Chest x-ray 12/27/2012  FINDINGS: There is a displaced humeral neck fracture no evidence of dislocation. Mild degenerate change of the [facility] joint. Left-sided cardiac pacemaker is present. Calcified plaque over the aortic arch is present.  IMPRESSION: Mildly  displaced humeral neck fracture.   Electronically Signed   By: Marin Olp M.D.   On: 11/21/2014 19:33   Dg Humerus Left  11/21/2014   CLINICAL DATA:  Fall.  Proximal LEFT humerus pain.  EXAM: LEFT HUMERUS - 2+ VIEW  COMPARISON:  None.  FINDINGS: Pacemaker power pack is evident in the LEFT upper chest. Moderate AC joint osteoarthritis with undersurface spurring.  Proximal LEFT humerus fracture is present. The fracture is comminuted and there is mild impaction. Diffuse osteopenia. Maximal radiographic displacement of proximal humerus fragments is 9 mm. This suggests a 1 part fracture.  IMPRESSION: Comminuted proximal LEFT humerus fracture. Radiographically this is a 1 part proximal humerus fracture.   Electronically Signed   By: Dereck Ligas M.D.   On: 11/21/2014 19:34   Dg Femur Min 2 Views Left  11/21/2014   CLINICAL DATA:  Fall.  EXAM: LEFT FEMUR 2 VIEWS  COMPARISON:  12/29/2012 and 12/27/2012  FINDINGS: Exam demonstrates diffuse decreased bone mineralization. There are minimal degenerative changes of the left hip. Left femoral hardware intact and unchanged. No evidence of acute fracture or dislocation. Chronic fragment just medial to the femoral neck.  IMPRESSION: No acute findings.   Electronically Signed   By: Marin Olp M.D.   On: 11/21/2014 19:36    Review of Systems  Cardiovascular:       Hypertension, pacemaker  Gastrointestinal:       History of heart burn  Musculoskeletal: Positive for falls (Fell at rest home, hurt left shoulder.).  Endo/Heme/Allergies:       History of hypothyroidism, anemia.   Blood pressure 158/44, pulse 85, temperature 98.4 F (36.9 C), temperature source Oral, resp. rate 18, height 5' 5.5" (1.664 m), weight 67.8 kg (149 lb 7.6 oz), SpO2 93 %. Physical Exam  Constitutional: She is oriented to person, place, and time. She appears well-developed and well-nourished.  HENT:  Head: Normocephalic and atraumatic.  Eyes: Conjunctivae and EOM are normal. Pupils  are equal, round, and reactive to light.  Neck: Normal range of motion. Neck supple.  Cardiovascular: Normal rate, regular rhythm and intact distal pulses.   Respiratory: Effort normal.  GI: Soft.  Musculoskeletal: She exhibits tenderness (Pain left shoulder, in shoulder immobilizer, NV intact.  Slight pain left posterior hip area.).  In shoulder immobilizer on the left.  Neurological: She is alert and oriented to person, place, and time. She has normal reflexes.  Skin: Skin is warm and dry.  Psychiatric: She has a normal mood and affect. Her behavior is normal. Thought content normal.    Assessment/Plan: Fracture of the proximal left humerus.  Continue shoulder immobilizer.  Sleep semi-erect in the immobilizer.  I will see her in  my office in two weeks.  X-rays then.  Continue shoulder immobilizer  Dajah Fischman 11/22/2014, 2:20 PM

## 2014-11-22 NOTE — Clinical Social Work Note (Signed)
Clinical Social Work Assessment  Patient Details  Name: [name] MRN: [id number] Date of Birth: 1922-10-25  Date of referral:  11/22/14               Reason for consult:  Facility Placement                Permission sought to share information with:  Family Supports Permission granted to share information::  Yes, Verbal Permission Granted  Name::     Scientist, clinical (histocompatibility and immunogenetics)::     Relationship::  son  Contact Information:     Housing/Transportation Living arrangements for the past 2 months:  [place] of Information:  Patient, Adult Children Patient Interpreter Needed:  None Criminal Activity/Legal Involvement Pertinent to Current Situation/Hospitalization:  No - Comment as needed Significant Relationships:  Adult Children Lives with:  Facility Resident Do you feel safe going back to the place where you live?  Yes Need for family participation in patient care:  Yes (Comment)  Care giving concerns:  Pt is long term resident at [facility].    Social Worker assessment / plan:  CSW met with pt and pt's son, [name] at bedside. Pt alert and oriented and reports she has been a resident at [facility] for about 4 years now. Pt was pushing another resident in a wheelchair while carrying her walker when she fell. Admitted with humerus fracture. At baseline, pt is fairly independent. She had to start using a walker a few weeks ago due to a flare up of polymyalgia. Pt indicates that acupuncture has helped. Pt is very happy at [facility] and considers this home. Her son agrees. They shared that after a hip fracture over a year ago, pt went to SNF short term and this was not a good experience. Per [name] at [facility], pt was receiving outpatient therapy and they can provide this up to 5 times a week. Okay to return as long as ALF appropriate. PT working with pt now.   Employment status:  Retired Forensic scientist:  Medicare PT Recommendations:  Not assessed at this time [place]  / Referral to community resources:  Other (Comment Required) (return to [facility])  Patient/Family's Response to care:  Pt and son very hopeful to return to [place] [facility] at d/c.   Patient/Family's Understanding of and Emotional Response to Diagnosis, Current Treatment, and Prognosis:  Pt and son appear to be understanding of admission diagnosis and are eager for ortho and PT consults to be complete in order to determine appropriate d/c plan.   Emotional Assessment Appearance:  Appears stated age Attitude/Demeanor/Rapport:  Other (Cooperative) Affect (typically observed):  Appropriate, Pleasant Orientation:  Oriented to Place, Oriented to Self, Oriented to  Time, Oriented to Situation Alcohol / Substance use:  Not Applicable Psych involvement (Current and /or in the community):  No (Comment)  Discharge Needs  Concerns to be addressed:  Discharge Planning Concerns Readmission within the last 30 days:  No Current discharge risk:  None Barriers to Discharge:  Continued Medical Work up   [employer], [employer], [place] 11/22/2014, 2:04 PM [phone]

## 2014-11-22 NOTE — Evaluation (Signed)
Physical Therapy Evaluation Patient Details Name: [name] MRN: [id number] DOB: 1922/08/06 Today's Date: 11/22/2014   History of Present Illness  Pt is an 81 year old female, resident of [facility] who was admitted with a proximal left hip fx.  She has recently been ambulating with a walker and has had a prior left hip fx 2 years ago.  Otherwise, she is independent with ADLs  Clinical Impression  This is a delightful pt who is seen for evaluation.  She was alert and oriented, very cooperative with PT.  Her LUE is in a sling and she has no pain at rest.  She reports having had frequent muscle spasms in the left hip but medication has relieved this.  She is found to have generalized weakness and deconditioning.  She needs the elevation of the [name] for transferring OOB and then mod assist to slide her hips.  Min assist to stand and mod assist to transfer bed to chair.  She will be NWB on the LUE for a few weeks (to be determined by MD) and will probably need to mobilize with a w/c for the near future.  She will need HHPT at d/c.  Follow Up Recommendations Home health PT    Equipment Recommendations  Hospital bed;Wheelchair (measurements PT)    Recommendations for Other Services   OT    Precautions / Restrictions Precautions Precautions: Fall Required Braces or Orthoses: Sling Restrictions Weight Bearing Restrictions: Yes LUE Weight Bearing: Non weight bearing      Mobility  Bed Mobility Overal bed mobility: Needs Assistance Bed Mobility: Supine to Sit     Supine to sit: Mod assist        Transfers Overall transfer level: Needs assistance Equipment used: 1 person hand held assist Transfers: Sit to/from [employer] Transfers Sit to Stand: Min assist Stand pivot transfers: Mod assist       General transfer comment: transfer is very slow and labored but stable  Ambulation/Gait Ambulation/Gait assistance:  (unable)                               Balance  Overall balance assessment: History of Falls (good sitting balance, unable to assess standing balance)                                           Pertinent Vitals/Pain Pain Assessment: No/denies pain    Home Living Family/patient expects to be discharged to:: Assisted living               Home Equipment: Walker - 2 wheels;Cane - quad      Prior Function Level of Independence: Independent with assistive device(s)               Hand Dominance        Extremity/Trunk Assessment   Upper Extremity Assessment: LUE deficits/detail       LUE Deficits / Details: LUE to be maintained in a sling unless bathing or changing clothes   Lower Extremity Assessment: Generalized weakness      Cervical / Trunk Assessment: Kyphotic  Communication   Communication: HOH  Cognition Arousal/Alertness: Awake/alert Behavior During Therapy: WFL for tasks assessed/performed Overall Cognitive Status: Within Functional Limits for tasks assessed  Assessment/Plan    PT Assessment Patient needs continued PT services  PT Diagnosis Difficulty walking;Generalized weakness;Acute pain   PT Problem List Decreased strength;Decreased activity tolerance;Decreased mobility;Decreased knowledge of precautions;Cardiopulmonary status limiting activity;Pain  PT Treatment Interventions Functional mobility training;Therapeutic exercise   PT Goals (Current goals can be found in the Care Plan section) Acute Rehab PT Goals Patient Stated Goal: none stated PT Goal Formulation: With patient/family Time For Goal Achievement: 12/06/14 Potential to Achieve Goals: Good    Frequency Min 5X/week   Barriers to discharge   none                   End of Session Equipment Utilized During Treatment: Gait belt Activity Tolerance: Patient tolerated treatment well Patient left: in chair;with call bell/phone within reach;with family/visitor  present      Functional Assessment Tool Used: clinical judgement Mobility: Walking and Moving Around Current Status (Z6109): At least 40 percent but less than 60 percent impaired, limited or restricted Mobility: Walking and Moving Around Goal Status [phone]): At least 20 percent but less than 40 percent impaired, limited or restricted    Time: 0981-1914 PT Time Calculation (min) (ACUTE ONLY): 62 min   Charges:   PT Evaluation $Initial PT Evaluation Tier I: 1 Procedure PT Treatments $Therapeutic Activity: 8-22 mins   PT G Codes:   PT G-Codes **NOT FOR INPATIENT CLASS** Functional Assessment Tool Used: clinical judgement Mobility: Walking and Moving Around Current Status (N8295): At least 40 percent but less than 60 percent impaired, limited or restricted Mobility: Walking and Moving Around Goal Status [phone]): At least 20 percent but less than 40 percent impaired, limited or restricted    Sable Feil  PT 11/22/2014, 2:52 PM [phone]

## 2014-11-23 DIAGNOSIS — I10 Essential (primary) hypertension: Secondary | ICD-10-CM

## 2014-11-23 LAB — FOLATE: Folate: 54.1 ng/mL (ref >5.9)

## 2014-11-23 LAB — CBC
HCT: 29.4 % — ABNORMAL LOW (ref 36.0–46.0)
Hemoglobin: 9.9 g/dL — ABNORMAL LOW (ref 12.0–15.0)
MCH: 33.6 pg (ref 26.0–34.0)
MCHC: 33.7 g/dL (ref 30.0–36.0)
MCV: 99.7 fL (ref 78.0–100.0)
Platelets: 235 10*3/uL (ref 150–400)
RBC: 2.95 MIL/uL — ABNORMAL LOW (ref 3.87–5.11)
RDW: 17.2 % — ABNORMAL HIGH (ref 11.5–15.5)
WBC: 6.9 10*3/uL (ref 4.0–10.5)

## 2014-11-23 LAB — BASIC METABOLIC PANEL
Anion gap: 9 (ref 5–15)
BUN: 12 mg/dL (ref 6–20)
CHLORIDE: 99 mmol/L — AB (ref 101–111)
CO2: 28 mmol/L (ref 22–32)
CREATININE: 0.84 mg/dL (ref 0.44–1.00)
Calcium: 8.7 mg/dL — ABNORMAL LOW (ref 8.9–10.3)
GFR calc Af Amer: >60 mL/min (ref >60)
GFR calc non Af Amer: 60 mL/min — ABNORMAL LOW (ref >60)
GLUCOSE: 95 mg/dL (ref 65–99)
Potassium: 3.7 mmol/L (ref 3.5–5.1)
Sodium: 136 mmol/L (ref 135–145)

## 2014-11-23 MED ORDER — HYDROCODONE-ACETAMINOPHEN 5-325 MG PO TABS: 1.0000 | ORAL_TABLET | ORAL | Status: DC | PRN

## 2014-11-23 MED ORDER — METHOCARBAMOL 500 MG PO TABS: 500.0000 mg | ORAL_TABLET | Freq: Four times a day (QID) | ORAL | Status: DC | PRN

## 2014-11-23 NOTE — Care Management Important Message (Signed)
Important Message  Patient Details  Name: SHANENA PELLEGRINO MRN: [id number] Date of Birth: 25-Apr-1922   Medicare Important Message Given:  N/A - LOS <3 / Initial given by admissions    Joylene Draft, RN 11/23/2014, 11:41 AM

## 2014-11-23 NOTE — Care Management Note (Signed)
Case Management Note  Patient Details  Name: [name] MRN: [id number] Date of Birth: October 11, 1922  Subjective/Objective:                    Action/Plan:   Expected Discharge Date:                  Expected Discharge Plan:  Assisted Living / Rest Home  In-House Referral:  Clinical Social Work  Discharge planning Services  CM Consult  Post Acute Care Choice:  Durable Medical Equipment Choice offered to:  Adult Children, Patient  DME Arranged:  Hospital bed, Wheelchair manual DME Agency:  [place]:    [place]:     Status of Service:  Completed, signed off  Medicare Important Message Given:    Date Medicare IM Given:    Medicare IM give by:    Date Additional Medicare IM Given:    Additional Medicare Important Message give by:     If discussed at [place] of Stay Meetings, dates discussed:    Additional Comments: Pt discharged back to [facility] with inhouse therapy. Pts w/c delivered to pts room by [facility] and hospital bed will be delivered to [facility] by [facility]. CSW to arrange discharge to facility. Christinia Gully Ila, RN 11/23/2014, 11:38 AM

## 2014-11-23 NOTE — Discharge Summary (Signed)
Physician Discharge Summary  [name] LNL:[id number] DOB: 08-Apr-1923 DOA: 11/21/2014  PCP: Delphina Cahill, MD  Admit date: 11/21/2014 Discharge date: 11/23/2014  Time spent: 40 minutes  Recommendations for Outpatient Follow-up:  1. Follow up with orthopedics 2 weeks with xray 2. HH PT NWB to left arm  Discharge Diagnoses:  Principal Problem:   Fracture, humerus, proximal Active Problems:   Hypertension   Hypothyroidism   Hyponatremia   Fall   Anemia   Dehydration   Discharge Condition: stable  Diet recommendation: heart healthy  Filed Weights   11/21/14 1829 11/21/14 2358  Weight: 66.679 kg (147 lb) 67.8 kg (149 lb 7.6 oz)    History of present illness:  With h/o bradycardia s/p pacemaker, htn, polymyalgia (recently diagnosed) currently on prednisone taper at 10mg  po qd, she was brought to the ED on 11/21/14 due to fall while trying to help another resident at [facility]. Prior to this, she had some left hip /leg pain which she attributed to polymyalgia. She had been getting physical therapy every other day and had been ambulating with a walker for the past few weeks.  ED course: x ray no hip fracture, did show proximal humeral neck fracture, she received fentanyl and norco for pain and sling to left arm. cbc showed wbc of 18, Bmp with mild hyponatremia sodium of 131, hospitalist called for admission.   On admission patient was sitting in the chair, initially denied any significant discomfort. She did c/o feeling weak, and feeling she was going to pass out, she was laid down to bed, rechecked blood pressure sbp in the 90's, she also c/o head discomfort, she did not report hitting her head, ns bolus given, Ct head stat ordered..  She otherwise, denies chest pain, no sob, no fever, no dysuria, no diarrhea, no n/v.  Hospital Course:  Fracture, humerus, left. proximal: due to mechanical fall. Sling in place. Fingers to left hand warm. Pain controlled. Evaluated by  Ortho who  recommended shoulder immobilizer and sleeping semi erect in immobilizer. Will follow up with ortho 2 weeks.  Active Problems: Anemia: currently Hg 9.3 which is close to baseline. Down from 11.0 on admission. Likely dilutional. No s/sx bleeding. Hx anemia. Anemia with ferritin 509, TIBC 220, folate pending.   Hypothyroidism: TSH 0.29. OP follow up   Hyponatremia:  decreased po intake. repleted and resolved.    Left leg pain: xray without acute fx. Patient complained "spasm". Improved with robaxin.  PT evaluation recommending OP PT.     Procedures:  none  Consultations:  orthopedics  Discharge Exam: Filed Vitals:   11/23/14 0529  BP: 180/74  Pulse: 84  Temp: 98.1 F (36.7 C)  Resp: 18    General: well nourished  Cardiovascular: RRR no MGR LE Respiratory: normal effort BS clear bilaterally no wheeze MS Left hand warm with good radial pulse. Left shoulder immobilzer intact  Discharge Instructions    Current Discharge Medication List    START taking these medications   Details  HYDROcodone-acetaminophen (NORCO/VICODIN) 5-325 MG per tablet Take 1 tablet by mouth every 4 (four) hours as needed for severe pain. Qty: 30 tablet, Refills: 0    methocarbamol (ROBAXIN) 500 MG tablet Take 1 tablet (500 mg total) by mouth every 6 (six) hours as needed for muscle spasms. Qty: 30 tablet, Refills: 0      CONTINUE these medications which have NOT CHANGED   Details  acetaminophen (TYLENOL) 325 MG tablet Take 650 mg by mouth every 6 (six)  hours as needed for moderate pain or headache.     amLODipine (NORVASC) 5 MG tablet Take 1 tablet by mouth daily.   Associated Diagnoses: Pacemaker    aspirin 325 MG tablet Take 325 mg by mouth daily.    !! beta carotene w/minerals (OCUVITE) tablet Take 1 tablet by mouth 2 (two) times daily.    cetirizine (ZYRTEC) 10 MG tablet Take 10 mg by mouth at bedtime.    cycloSPORINE (RESTASIS) 0.05 % ophthalmic emulsion Place 1 drop into both  eyes 2 (two) times daily.     docusate sodium (COLACE) 100 MG capsule Take 100 mg by mouth at bedtime.    fluticasone (FLONASE) 50 MCG/ACT nasal spray Place 2 sprays into the nose daily. For allergies    hydrALAZINE (APRESOLINE) 25 MG tablet Take 25 mg by mouth 2 (two) times daily.      Iron-FA-B Cmp-C-Biot-Probiotic (FUSION PLUS PO) Take 1 capsule by mouth daily.    levothyroxine (SYNTHROID, LEVOTHROID) 112 MCG tablet Take 112 mcg by mouth daily before breakfast.    lisinopril (PRINIVIL,ZESTRIL) 20 MG tablet Take 20 mg by mouth 2 (two) times daily.     loperamide (IMODIUM) 2 MG capsule Take 2 mg by mouth every 4 (four) hours as needed for diarrhea or loose stools.    Magnesium 250 MG TABS Take 1 tablet by mouth every evening.    Melatonin 1 MG TABS Take 1 tablet by mouth at bedtime.    !! Multiple Vitamins-Minerals (THERATRUM COMPLETE PO) Take 1 tablet by mouth 2 (two) times daily.    omeprazole (PRILOSEC) 20 MG capsule Take 20 mg by mouth every morning.     polyethylene glycol powder (GLYCOLAX/MIRALAX) powder Take 17 g by mouth daily. For constipation. *One capful to be mixed in water or juice*    predniSONE (DELTASONE) 10 MG tablet Take 10 mg by mouth daily with breakfast.     Probiotic Product (ALIGN) 4 MG CAPS Take 1 capsule by mouth daily.    Wheat Dextrin (BENEFIBER DRINK MIX PO) Take by mouth daily. Mix 2 teaspoonfuls with water and drink daily.     !! - Potential duplicate medications found. Please discuss with provider.    STOP taking these medications     tiZANidine (ZANAFLEX) 2 MG tablet        Allergies  Allergen Reactions  . Tramadol Itching and Nausea Only  . Sulfa Antibiotics Nausea And Vomiting   Follow-up Information    Schedule an appointment as soon as possible for a visit with [facility].   Specialty:  Specialist   Contact information:   [address] [place] [address] [phone]        The results of  significant diagnostics from this hospitalization (including imaging, microbiology, ancillary and laboratory) are listed below for reference.    Significant Diagnostic Studies: Dg Chest 1 View  11/21/2014   CLINICAL DATA:  Fall.  EXAM: CHEST  1 VIEW  COMPARISON:  12/27/2012  FINDINGS: Left-sided pacemaker is intact and unchanged. Lungs are adequately inflated without consolidation, effusion or pneumothorax. Cardiomediastinal silhouette is within normal. Known compression fracture over the mid thoracic spine. Acute left humeral neck fracture. Remainder of the exam is unchanged.  IMPRESSION: No acute cardiopulmonary disease.  Known acute left humeral neck fracture.  Stable old mid thoracic spine compression fracture.   Electronically Signed   By: Marin Olp M.D.   On: 11/21/2014 19:38   Dg Pelvis 1-2 Views  11/21/2014  CLINICAL DATA:  Fall.  Pelvic pain.  EXAM: PELVIS - 1-2 VIEW  COMPARISON:  12/29/2012.  FINDINGS: LEFT hip gamma nail is present. The intertrochanteric fracture has healed. Calcified fibroid in the anatomic pelvis. Sacral arcades are obscured by overlying stool and bowel gas. Proximal RIGHT femur appears normal. RIGHT hip joint space appears preserved. The obturator rings appear intact bilaterally. Lower lumbar spondylosis.  IMPRESSION: No acute osseous abnormality. LEFT hip gamma nail with healed intertrochanteric fracture.   Electronically Signed   By: Dereck Ligas M.D.   On: 11/21/2014 19:40   Ct Head Wo Contrast  11/21/2014   CLINICAL DATA:  [place], landing on left side.  EXAM: CT HEAD WITHOUT CONTRAST  TECHNIQUE: Contiguous axial images were obtained from the base of the skull through the vertex without intravenous contrast.  COMPARISON:  None.  FINDINGS: There is no intracranial hemorrhage, mass or evidence of acute infarction. There is no extra-axial fluid collection. Gray matter and white matter appear normal. Cerebral volume is normal for age. Brainstem and posterior fossa are  unremarkable. The CSF spaces appear normal.  The bony structures are intact. 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 Remainder of the visible paranasal sinuses are clear.  IMPRESSION: Normal brain.  No acute findings.   Electronically Signed   By: Andreas Newport M.D.   On: 11/21/2014 22:38   Dg Shoulder Left  11/21/2014   CLINICAL DATA:  Fall injuring left side with left shoulder pain.  EXAM: LEFT SHOULDER - 2+ VIEW  COMPARISON:  Chest x-ray 12/27/2012  FINDINGS: There is a displaced humeral neck fracture no evidence of dislocation. Mild degenerate change of the [facility] joint. Left-sided cardiac pacemaker is present. Calcified plaque over the aortic arch is present.  IMPRESSION: Mildly displaced humeral neck fracture.   Electronically Signed   By: Marin Olp M.D.   On: 11/21/2014 19:33   Dg Humerus Left  11/21/2014   CLINICAL DATA:  Fall.  Proximal LEFT humerus pain.  EXAM: LEFT HUMERUS - 2+ VIEW  COMPARISON:  None.  FINDINGS: Pacemaker power pack is evident in the LEFT upper chest. Moderate AC joint osteoarthritis with undersurface spurring.  Proximal LEFT humerus fracture is present. The fracture is comminuted and there is mild impaction. Diffuse osteopenia. Maximal radiographic displacement of proximal humerus fragments is 9 mm. This suggests a 1 part fracture.  IMPRESSION: Comminuted proximal LEFT humerus fracture. Radiographically this is a 1 part proximal humerus fracture.   Electronically Signed   By: Dereck Ligas M.D.   On: 11/21/2014 19:34   Dg Femur Min 2 Views Left  11/21/2014   CLINICAL DATA:  Fall.  EXAM: LEFT FEMUR 2 VIEWS  COMPARISON:  12/29/2012 and 12/27/2012  FINDINGS: Exam demonstrates diffuse decreased bone mineralization. There are minimal degenerative changes of the left hip. Left femoral hardware intact and unchanged. No evidence of acute fracture or dislocation. Chronic fragment just medial to the femoral neck.  IMPRESSION: No acute findings.   Electronically Signed    By: Marin Olp M.D.   On: 11/21/2014 19:36    Microbiology: Recent Results (from the past 240 hour(s))  MRSA PCR Screening     Status: None   Collection Time: 11/22/14 12:45 AM  Result Value Ref Range Status   MRSA by PCR NEGATIVE NEGATIVE Final    Comment:        The GeneXpert MRSA Assay (FDA approved for NASAL specimens only), is one component of a comprehensive MRSA colonization surveillance program. It is not intended  to diagnose MRSA infection nor to guide or monitor treatment for MRSA infections.      Labs: Basic Metabolic Panel:  Recent Labs Lab 11/21/14 2126 11/22/14 0507 11/23/14 0642  NA 131* 133* 136  K 3.8 3.8 3.7  CL 96* 98* 99*  CO2 25 25 28   GLUCOSE 132* 110* 95  BUN 21* 19 12  CREATININE 1.05* 0.87 0.84  CALCIUM 8.8* 8.1* 8.7*   Liver Function Tests:  Recent Labs Lab 11/22/14 0507  AST 20  ALT 19  ALKPHOS 51  BILITOT 0.6  PROT 5.4*  ALBUMIN 3.5   No results for input(s): LIPASE, AMYLASE in the last 168 hours. No results for input(s): AMMONIA in the last 168 hours. CBC:  Recent Labs Lab 11/21/14 2126 11/22/14 0507 11/23/14 0642  WBC 18.4* 10.0 6.9  NEUTROABS 15.4*  --   --   HGB 11.0* 9.3* 9.9*  HCT 32.2* 26.9* 29.4*  MCV 98.2 98.9 99.7  PLT 280 219 235   Cardiac Enzymes: No results for input(s): CKTOTAL, CKMB, CKMBINDEX, TROPONINI in the last 168 hours. BNP: BNP (last 3 results) No results for input(s): BNP in the last 8760 hours.  ProBNP (last 3 results) No results for input(s): PROBNP in the last 8760 hours.  CBG: No results for input(s): GLUCAP in the last 168 hours.     SignedRadene Gunning  [facility] 11/23/2014, 10:37 AM

## 2014-11-23 NOTE — Progress Notes (Signed)
Pt discharged to [facility] in [place] via private vehicle with her son.  VS stable.  Left arm secured in sling.  Peripheral IV removed and intact.  Discharge instructions reviewed with pt and her son.  No questions.  Verbalized understanding.  Will continue to monitor.

## 2014-11-23 NOTE — Clinical Social Work Note (Addendum)
Pt d/c today back to [facility]. Pt and facility aware and agreeable. Pt states her son just stepped out and he is planning to take her back to facility today. [place] aware of PT recommendations.   Benay Pike, [place]

## 2014-11-24 DIAGNOSIS — Z8781 Personal history of (healed) traumatic fracture: Secondary | ICD-10-CM | POA: Diagnosis not present

## 2014-11-24 DIAGNOSIS — S42202A Unspecified fracture of upper end of left humerus, initial encounter for closed fracture: Secondary | ICD-10-CM | POA: Diagnosis not present

## 2014-11-24 DIAGNOSIS — R262 Difficulty in walking, not elsewhere classified: Secondary | ICD-10-CM | POA: Diagnosis not present

## 2014-11-24 DIAGNOSIS — M542 Cervicalgia: Secondary | ICD-10-CM | POA: Diagnosis not present

## 2014-11-24 DIAGNOSIS — M353 Polymyalgia rheumatica: Secondary | ICD-10-CM | POA: Diagnosis not present

## 2014-11-24 DIAGNOSIS — M545 Low back pain: Secondary | ICD-10-CM | POA: Diagnosis not present

## 2014-11-24 DIAGNOSIS — M6281 Muscle weakness (generalized): Secondary | ICD-10-CM | POA: Diagnosis not present

## 2014-11-24 DIAGNOSIS — M1712 Unilateral primary osteoarthritis, left knee: Secondary | ICD-10-CM | POA: Diagnosis not present

## 2014-11-25 DIAGNOSIS — M353 Polymyalgia rheumatica: Secondary | ICD-10-CM | POA: Diagnosis not present

## 2014-11-25 DIAGNOSIS — M6281 Muscle weakness (generalized): Secondary | ICD-10-CM | POA: Diagnosis not present

## 2014-11-25 DIAGNOSIS — R262 Difficulty in walking, not elsewhere classified: Secondary | ICD-10-CM | POA: Diagnosis not present

## 2014-11-25 DIAGNOSIS — Z8781 Personal history of (healed) traumatic fracture: Secondary | ICD-10-CM | POA: Diagnosis not present

## 2014-11-25 DIAGNOSIS — M545 Low back pain: Secondary | ICD-10-CM | POA: Diagnosis not present

## 2014-11-25 DIAGNOSIS — M1712 Unilateral primary osteoarthritis, left knee: Secondary | ICD-10-CM | POA: Diagnosis not present

## 2014-11-26 DIAGNOSIS — M353 Polymyalgia rheumatica: Secondary | ICD-10-CM | POA: Diagnosis not present

## 2014-11-26 DIAGNOSIS — M1712 Unilateral primary osteoarthritis, left knee: Secondary | ICD-10-CM | POA: Diagnosis not present

## 2014-11-26 DIAGNOSIS — Z8781 Personal history of (healed) traumatic fracture: Secondary | ICD-10-CM | POA: Diagnosis not present

## 2014-11-26 DIAGNOSIS — R262 Difficulty in walking, not elsewhere classified: Secondary | ICD-10-CM | POA: Diagnosis not present

## 2014-11-26 DIAGNOSIS — M6281 Muscle weakness (generalized): Secondary | ICD-10-CM | POA: Diagnosis not present

## 2014-11-26 DIAGNOSIS — M545 Low back pain: Secondary | ICD-10-CM | POA: Diagnosis not present

## 2014-11-29 DIAGNOSIS — M6281 Muscle weakness (generalized): Secondary | ICD-10-CM | POA: Diagnosis not present

## 2014-11-29 DIAGNOSIS — M545 Low back pain: Secondary | ICD-10-CM | POA: Diagnosis not present

## 2014-11-29 DIAGNOSIS — M353 Polymyalgia rheumatica: Secondary | ICD-10-CM | POA: Diagnosis not present

## 2014-11-29 DIAGNOSIS — R262 Difficulty in walking, not elsewhere classified: Secondary | ICD-10-CM | POA: Diagnosis not present

## 2014-11-29 DIAGNOSIS — Z8781 Personal history of (healed) traumatic fracture: Secondary | ICD-10-CM | POA: Diagnosis not present

## 2014-11-29 DIAGNOSIS — M1712 Unilateral primary osteoarthritis, left knee: Secondary | ICD-10-CM | POA: Diagnosis not present

## 2014-11-30 ENCOUNTER — Encounter: Payer: Self-pay | Admitting: Internal Medicine

## 2014-11-30 ENCOUNTER — Ambulatory Visit (INDEPENDENT_AMBULATORY_CARE_PROVIDER_SITE_OTHER): Payer: Medicare Other | Admitting: *Deleted

## 2014-11-30 DIAGNOSIS — M6281 Muscle weakness (generalized): Secondary | ICD-10-CM | POA: Diagnosis not present

## 2014-11-30 DIAGNOSIS — R001 Bradycardia, unspecified: Secondary | ICD-10-CM

## 2014-11-30 DIAGNOSIS — M1712 Unilateral primary osteoarthritis, left knee: Secondary | ICD-10-CM | POA: Diagnosis not present

## 2014-11-30 DIAGNOSIS — M545 Low back pain: Secondary | ICD-10-CM | POA: Diagnosis not present

## 2014-11-30 DIAGNOSIS — R262 Difficulty in walking, not elsewhere classified: Secondary | ICD-10-CM | POA: Diagnosis not present

## 2014-11-30 DIAGNOSIS — M353 Polymyalgia rheumatica: Secondary | ICD-10-CM | POA: Diagnosis not present

## 2014-11-30 DIAGNOSIS — Z8781 Personal history of (healed) traumatic fracture: Secondary | ICD-10-CM | POA: Diagnosis not present

## 2014-11-30 NOTE — Progress Notes (Signed)
Remote pacemaker transmission.   

## 2014-12-01 DIAGNOSIS — M353 Polymyalgia rheumatica: Secondary | ICD-10-CM | POA: Diagnosis not present

## 2014-12-01 DIAGNOSIS — M6281 Muscle weakness (generalized): Secondary | ICD-10-CM | POA: Diagnosis not present

## 2014-12-01 DIAGNOSIS — R262 Difficulty in walking, not elsewhere classified: Secondary | ICD-10-CM | POA: Diagnosis not present

## 2014-12-01 DIAGNOSIS — Z8781 Personal history of (healed) traumatic fracture: Secondary | ICD-10-CM | POA: Diagnosis not present

## 2014-12-01 DIAGNOSIS — M1712 Unilateral primary osteoarthritis, left knee: Secondary | ICD-10-CM | POA: Diagnosis not present

## 2014-12-01 DIAGNOSIS — M545 Low back pain: Secondary | ICD-10-CM | POA: Diagnosis not present

## 2014-12-02 DIAGNOSIS — M353 Polymyalgia rheumatica: Secondary | ICD-10-CM | POA: Diagnosis not present

## 2014-12-02 DIAGNOSIS — M6281 Muscle weakness (generalized): Secondary | ICD-10-CM | POA: Diagnosis not present

## 2014-12-02 DIAGNOSIS — M1712 Unilateral primary osteoarthritis, left knee: Secondary | ICD-10-CM | POA: Diagnosis not present

## 2014-12-02 DIAGNOSIS — R262 Difficulty in walking, not elsewhere classified: Secondary | ICD-10-CM | POA: Diagnosis not present

## 2014-12-02 DIAGNOSIS — Z8781 Personal history of (healed) traumatic fracture: Secondary | ICD-10-CM | POA: Diagnosis not present

## 2014-12-02 DIAGNOSIS — M545 Low back pain: Secondary | ICD-10-CM | POA: Diagnosis not present

## 2014-12-03 ENCOUNTER — Inpatient Hospital Stay (HOSPITAL_COMMUNITY)
Admission: EM | Admit: 2014-12-03 | Discharge: 2014-12-06 | DRG: 641 | Disposition: A | Discharge status: Skilled Nursing Facility | Payer: Medicare Other | Attending: Internal Medicine | Admitting: Internal Medicine

## 2014-12-03 ENCOUNTER — Emergency Department (HOSPITAL_COMMUNITY): Payer: Medicare Other

## 2014-12-03 ENCOUNTER — Encounter (HOSPITAL_COMMUNITY): Payer: Self-pay | Admitting: Emergency Medicine

## 2014-12-03 DIAGNOSIS — M810 Age-related osteoporosis without current pathological fracture: Secondary | ICD-10-CM | POA: Diagnosis present

## 2014-12-03 DIAGNOSIS — R278 Other lack of coordination: Secondary | ICD-10-CM | POA: Diagnosis not present

## 2014-12-03 DIAGNOSIS — S32502A Unspecified fracture of left pubis, initial encounter for closed fracture: Secondary | ICD-10-CM | POA: Diagnosis not present

## 2014-12-03 DIAGNOSIS — E871 Hypo-osmolality and hyponatremia: Secondary | ICD-10-CM | POA: Diagnosis not present

## 2014-12-03 DIAGNOSIS — J302 Other seasonal allergic rhinitis: Secondary | ICD-10-CM | POA: Diagnosis present

## 2014-12-03 DIAGNOSIS — Z66 Do not resuscitate: Secondary | ICD-10-CM | POA: Diagnosis present

## 2014-12-03 DIAGNOSIS — E039 Hypothyroidism, unspecified: Secondary | ICD-10-CM | POA: Diagnosis present

## 2014-12-03 DIAGNOSIS — E860 Dehydration: Secondary | ICD-10-CM | POA: Diagnosis not present

## 2014-12-03 DIAGNOSIS — S2231XD Fracture of one rib, right side, subsequent encounter for fracture with routine healing: Secondary | ICD-10-CM | POA: Diagnosis not present

## 2014-12-03 DIAGNOSIS — S42212A Unspecified displaced fracture of surgical neck of left humerus, initial encounter for closed fracture: Secondary | ICD-10-CM | POA: Diagnosis not present

## 2014-12-03 DIAGNOSIS — S2239XA Fracture of one rib, unspecified side, initial encounter for closed fracture: Secondary | ICD-10-CM | POA: Diagnosis not present

## 2014-12-03 DIAGNOSIS — D473 Essential (hemorrhagic) thrombocythemia: Secondary | ICD-10-CM | POA: Diagnosis present

## 2014-12-03 DIAGNOSIS — S2231XA Fracture of one rib, right side, initial encounter for closed fracture: Secondary | ICD-10-CM | POA: Diagnosis present

## 2014-12-03 DIAGNOSIS — Z950 Presence of cardiac pacemaker: Secondary | ICD-10-CM | POA: Diagnosis not present

## 2014-12-03 DIAGNOSIS — S42295D Other nondisplaced fracture of upper end of left humerus, subsequent encounter for fracture with routine healing: Secondary | ICD-10-CM | POA: Diagnosis not present

## 2014-12-03 DIAGNOSIS — D72829 Elevated white blood cell count, unspecified: Secondary | ICD-10-CM | POA: Diagnosis present

## 2014-12-03 DIAGNOSIS — R531 Weakness: Secondary | ICD-10-CM | POA: Diagnosis not present

## 2014-12-03 DIAGNOSIS — Z79899 Other long term (current) drug therapy: Secondary | ICD-10-CM

## 2014-12-03 DIAGNOSIS — K649 Unspecified hemorrhoids: Secondary | ICD-10-CM | POA: Diagnosis present

## 2014-12-03 DIAGNOSIS — E785 Hyperlipidemia, unspecified: Secondary | ICD-10-CM | POA: Diagnosis present

## 2014-12-03 DIAGNOSIS — H3530 Unspecified macular degeneration: Secondary | ICD-10-CM | POA: Diagnosis present

## 2014-12-03 DIAGNOSIS — M549 Dorsalgia, unspecified: Secondary | ICD-10-CM | POA: Diagnosis not present

## 2014-12-03 DIAGNOSIS — Z7982 Long term (current) use of aspirin: Secondary | ICD-10-CM

## 2014-12-03 DIAGNOSIS — S42402S Unspecified fracture of lower end of left humerus, sequela: Secondary | ICD-10-CM | POA: Diagnosis not present

## 2014-12-03 DIAGNOSIS — Z7952 Long term (current) use of systemic steroids: Secondary | ICD-10-CM

## 2014-12-03 DIAGNOSIS — S42209A Unspecified fracture of upper end of unspecified humerus, initial encounter for closed fracture: Secondary | ICD-10-CM | POA: Diagnosis present

## 2014-12-03 DIAGNOSIS — S42302S Unspecified fracture of shaft of humerus, left arm, sequela: Secondary | ICD-10-CM

## 2014-12-03 DIAGNOSIS — S3992XA Unspecified injury of lower back, initial encounter: Secondary | ICD-10-CM | POA: Diagnosis not present

## 2014-12-03 DIAGNOSIS — K219 Gastro-esophageal reflux disease without esophagitis: Secondary | ICD-10-CM | POA: Diagnosis present

## 2014-12-03 DIAGNOSIS — R2681 Unsteadiness on feet: Secondary | ICD-10-CM | POA: Diagnosis not present

## 2014-12-03 DIAGNOSIS — Z8249 Family history of ischemic heart disease and other diseases of the circulatory system: Secondary | ICD-10-CM

## 2014-12-03 DIAGNOSIS — W19XXXA Unspecified fall, initial encounter: Secondary | ICD-10-CM | POA: Diagnosis present

## 2014-12-03 DIAGNOSIS — D649 Anemia, unspecified: Secondary | ICD-10-CM | POA: Diagnosis not present

## 2014-12-03 DIAGNOSIS — Z9181 History of falling: Secondary | ICD-10-CM | POA: Diagnosis not present

## 2014-12-03 DIAGNOSIS — I10 Essential (primary) hypertension: Secondary | ICD-10-CM | POA: Diagnosis present

## 2014-12-03 DIAGNOSIS — S32591A Other specified fracture of right pubis, initial encounter for closed fracture: Secondary | ICD-10-CM | POA: Diagnosis not present

## 2014-12-03 DIAGNOSIS — S42201D Unspecified fracture of upper end of right humerus, subsequent encounter for fracture with routine healing: Secondary | ICD-10-CM

## 2014-12-03 DIAGNOSIS — S32591D Other specified fracture of right pubis, subsequent encounter for fracture with routine healing: Secondary | ICD-10-CM | POA: Diagnosis not present

## 2014-12-03 DIAGNOSIS — S329XXA Fracture of unspecified parts of lumbosacral spine and pelvis, initial encounter for closed fracture: Secondary | ICD-10-CM

## 2014-12-03 DIAGNOSIS — M6281 Muscle weakness (generalized): Secondary | ICD-10-CM | POA: Diagnosis not present

## 2014-12-03 DIAGNOSIS — R404 Transient alteration of awareness: Secondary | ICD-10-CM | POA: Diagnosis not present

## 2014-12-03 LAB — SODIUM, URINE, RANDOM: Sodium, Ur: 41 mmol/L

## 2014-12-03 LAB — URINALYSIS, ROUTINE W REFLEX MICROSCOPIC
BILIRUBIN URINE: NEGATIVE
Glucose, UA: NEGATIVE mg/dL
Hgb urine dipstick: NEGATIVE
Ketones, ur: NEGATIVE mg/dL
Nitrite: NEGATIVE
Protein, ur: NEGATIVE mg/dL
Specific Gravity, Urine: 1.005 (ref 1.005–1.030)
Urobilinogen, UA: 0.2 mg/dL (ref 0.0–1.0)
pH: 8 (ref 5.0–8.0)

## 2014-12-03 LAB — COMPREHENSIVE METABOLIC PANEL
ALBUMIN: 3.6 g/dL (ref 3.5–5.0)
ALT: 17 U/L (ref 14–54)
AST: 22 U/L (ref 15–41)
Alkaline Phosphatase: 103 U/L (ref 38–126)
Anion gap: 8 (ref 5–15)
BUN: 15 mg/dL (ref 6–20)
CO2: 25 mmol/L (ref 22–32)
CREATININE: 0.98 mg/dL (ref 0.44–1.00)
Calcium: 8.7 mg/dL — ABNORMAL LOW (ref 8.9–10.3)
Chloride: 90 mmol/L — ABNORMAL LOW (ref 101–111)
GFR calc Af Amer: 58 mL/min — ABNORMAL LOW (ref >60)
GFR calc non Af Amer: 50 mL/min — ABNORMAL LOW (ref >60)
Glucose, Bld: 138 mg/dL — ABNORMAL HIGH (ref 65–99)
Potassium: 4.4 mmol/L (ref 3.5–5.1)
Sodium: 123 mmol/L — ABNORMAL LOW (ref 135–145)
TOTAL PROTEIN: 6.4 g/dL — AB (ref 6.5–8.1)
Total Bilirubin: 0.8 mg/dL (ref 0.3–1.2)

## 2014-12-03 LAB — CBC WITH DIFFERENTIAL/PLATELET
Basophils Absolute: 0 10*3/uL (ref 0.0–0.1)
Basophils Relative: 0 % (ref 0–1)
Eosinophils Absolute: 0 10*3/uL (ref 0.0–0.7)
Eosinophils Relative: 0 % (ref 0–5)
HCT: 27.1 % — ABNORMAL LOW (ref 36.0–46.0)
Hemoglobin: 9.4 g/dL — ABNORMAL LOW (ref 12.0–15.0)
LYMPHS ABS: 0.6 10*3/uL — AB (ref 0.7–4.0)
Lymphocytes Relative: 5 % — ABNORMAL LOW (ref 12–46)
MCH: 32.9 pg (ref 26.0–34.0)
MCHC: 34.7 g/dL (ref 30.0–36.0)
MCV: 94.8 fL (ref 78.0–100.0)
Monocytes Absolute: 0.7 10*3/uL (ref 0.1–1.0)
Monocytes Relative: 6 % (ref 3–12)
Neutro Abs: 9.9 10*3/uL — ABNORMAL HIGH (ref 1.7–7.7)
Neutrophils Relative %: 89 % — ABNORMAL HIGH (ref 43–77)
PLATELETS: 583 10*3/uL — AB (ref 150–400)
RBC: 2.86 MIL/uL — AB (ref 3.87–5.11)
RDW: 18.2 % — ABNORMAL HIGH (ref 11.5–15.5)
WBC: 11.2 10*3/uL — ABNORMAL HIGH (ref 4.0–10.5)

## 2014-12-03 LAB — CREATININE, SERUM
CREATININE: 0.89 mg/dL (ref 0.44–1.00)
GFR calc Af Amer: >60 mL/min (ref >60)
GFR calc non Af Amer: 56 mL/min — ABNORMAL LOW (ref >60)

## 2014-12-03 LAB — CBC
HEMATOCRIT: 29.1 % — AB (ref 36.0–46.0)
HEMOGLOBIN: 10 g/dL — AB (ref 12.0–15.0)
MCH: 32.9 pg (ref 26.0–34.0)
MCHC: 34.4 g/dL (ref 30.0–36.0)
MCV: 95.7 fL (ref 78.0–100.0)
Platelets: 599 10*3/uL — ABNORMAL HIGH (ref 150–400)
RBC: 3.04 MIL/uL — AB (ref 3.87–5.11)
RDW: 18.2 % — AB (ref 11.5–15.5)
WBC: 10 10*3/uL (ref 4.0–10.5)

## 2014-12-03 LAB — URINE MICROSCOPIC-ADD ON

## 2014-12-03 LAB — CREATININE, URINE, RANDOM: CREATININE, URINE: 17.33 mg/dL

## 2014-12-03 LAB — TROPONIN I: Troponin I: <0.03 ng/mL (ref <0.031)

## 2014-12-03 MED ORDER — CYCLOSPORINE 0.05 % OP EMUL
1.0000 [drp] | Freq: Two times a day (BID) | OPHTHALMIC | Status: DC
  Administered 2014-12-03 – 2014-12-06 (×6): 1 [drp] via OPHTHALMIC
  Filled 2014-12-03 (×8): qty 1

## 2014-12-03 MED ORDER — SODIUM CHLORIDE 0.9 % IV SOLN
Freq: Once | INTRAVENOUS | Status: AC
  Administered 2014-12-03: 15:00:00 via INTRAVENOUS

## 2014-12-03 MED ORDER — MELATONIN 1 MG PO TABS: 1.0000 | ORAL_TABLET | Freq: Every day | ORAL | Status: DC

## 2014-12-03 MED ORDER — HYDRALAZINE HCL 25 MG PO TABS
25.0000 mg | ORAL_TABLET | Freq: Two times a day (BID) | ORAL | Status: DC
  Administered 2014-12-03 – 2014-12-06 (×6): 25 mg via ORAL
  Filled 2014-12-03 (×7): qty 1

## 2014-12-03 MED ORDER — SODIUM CHLORIDE 0.9 % IV BOLUS (SEPSIS)
500.0000 mL | Freq: Once | INTRAVENOUS | Status: AC
  Administered 2014-12-03: 500 mL via INTRAVENOUS

## 2014-12-03 MED ORDER — HYDROCORTISONE 2.5 % RE CREA
TOPICAL_CREAM | Freq: Three times a day (TID) | RECTAL | Status: DC
  Administered 2014-12-03 – 2014-12-05 (×7): via RECTAL
  Administered 2014-12-06: 1 via RECTAL
  Filled 2014-12-03: qty 28.35

## 2014-12-03 MED ORDER — MORPHINE SULFATE 2 MG/ML IJ SOLN
1.0000 mg | INTRAMUSCULAR | Status: DC | PRN
  Administered 2014-12-03 – 2014-12-06 (×11): 1 mg via INTRAVENOUS
  Filled 2014-12-03 (×11): qty 1

## 2014-12-03 MED ORDER — PREDNISONE 10 MG PO TABS
10.0000 mg | ORAL_TABLET | Freq: Every day | ORAL | Status: DC
  Administered 2014-12-04 – 2014-12-06 (×3): 10 mg via ORAL
  Filled 2014-12-03 (×4): qty 1

## 2014-12-03 MED ORDER — DOCUSATE SODIUM 100 MG PO CAPS
100.0000 mg | ORAL_CAPSULE | Freq: Every day | ORAL | Status: DC
  Administered 2014-12-03 – 2014-12-05 (×3): 100 mg via ORAL
  Filled 2014-12-03 (×4): qty 1

## 2014-12-03 MED ORDER — FLUTICASONE PROPIONATE 50 MCG/ACT NA SUSP
2.0000 | Freq: Every day | NASAL | Status: DC
  Administered 2014-12-05 – 2014-12-06 (×2): 2 via NASAL
  Filled 2014-12-03: qty 16

## 2014-12-03 MED ORDER — POLYETHYLENE GLYCOL 3350 17 G PO PACK
17.0000 g | PACK | Freq: Every day | ORAL | Status: DC
  Administered 2014-12-04 – 2014-12-06 (×3): 17 g via ORAL
  Filled 2014-12-03 (×3): qty 1

## 2014-12-03 MED ORDER — ENOXAPARIN SODIUM 40 MG/0.4ML ~~LOC~~ SOLN
40.0000 mg | SUBCUTANEOUS | Status: DC
  Administered 2014-12-03 – 2014-12-05 (×3): 40 mg via SUBCUTANEOUS
  Filled 2014-12-03 (×4): qty 0.4

## 2014-12-03 MED ORDER — LISINOPRIL 20 MG PO TABS
20.0000 mg | ORAL_TABLET | Freq: Two times a day (BID) | ORAL | Status: DC
  Administered 2014-12-03 – 2014-12-06 (×6): 20 mg via ORAL
  Filled 2014-12-03 (×7): qty 1

## 2014-12-03 MED ORDER — AMLODIPINE BESYLATE 5 MG PO TABS
5.0000 mg | ORAL_TABLET | Freq: Every day | ORAL | Status: DC
  Administered 2014-12-04 – 2014-12-06 (×3): 5 mg via ORAL
  Filled 2014-12-03 (×3): qty 1

## 2014-12-03 MED ORDER — BISACODYL 10 MG/30ML RE ENEM: 10.0000 mg | ENEMA | RECTAL | Status: DC | PRN

## 2014-12-03 MED ORDER — ONDANSETRON HCL 4 MG/2ML IJ SOLN: 4.0000 mg | Freq: Four times a day (QID) | INTRAMUSCULAR | Status: DC | PRN

## 2014-12-03 MED ORDER — HYDROCODONE-ACETAMINOPHEN 5-325 MG PO TABS
1.0000 | ORAL_TABLET | ORAL | Status: DC | PRN
  Administered 2014-12-03 – 2014-12-06 (×10): 1 via ORAL
  Filled 2014-12-03 (×11): qty 1

## 2014-12-03 MED ORDER — ALIGN 4 MG PO CAPS
1.0000 | ORAL_CAPSULE | Freq: Every day | ORAL | Status: DC
  Administered 2014-12-04 – 2014-12-06 (×3): 1 via ORAL
  Filled 2014-12-03 (×3): qty 1

## 2014-12-03 MED ORDER — FUSION PLUS PO CAPS: 1.0000 | ORAL_CAPSULE | Freq: Every day | ORAL | Status: DC

## 2014-12-03 MED ORDER — ONDANSETRON HCL 4 MG PO TABS: 4.0000 mg | ORAL_TABLET | Freq: Four times a day (QID) | ORAL | Status: DC | PRN

## 2014-12-03 MED ORDER — ACETAMINOPHEN 325 MG PO TABS: 650.0000 mg | ORAL_TABLET | Freq: Four times a day (QID) | ORAL | Status: DC | PRN

## 2014-12-03 MED ORDER — FA-PYRIDOXINE-CYANOCOBALAMIN 2.5-25-2 MG PO TABS
1.0000 | ORAL_TABLET | Freq: Every day | ORAL | Status: DC
  Administered 2014-12-04 – 2014-12-06 (×3): 1 via ORAL
  Filled 2014-12-03 (×3): qty 1

## 2014-12-03 MED ORDER — ASPIRIN 325 MG PO TABS
325.0000 mg | ORAL_TABLET | Freq: Every day | ORAL | Status: DC
  Administered 2014-12-04 – 2014-12-06 (×3): 325 mg via ORAL
  Filled 2014-12-03 (×3): qty 1

## 2014-12-03 MED ORDER — FLEET ENEMA 7-19 GM/118ML RE ENEM
1.0000 | ENEMA | RECTAL | Status: DC | PRN
  Administered 2014-12-04: 1 via RECTAL
  Filled 2014-12-03: qty 1

## 2014-12-03 MED ORDER — PANTOPRAZOLE SODIUM 40 MG PO TBEC
40.0000 mg | DELAYED_RELEASE_TABLET | Freq: Every day | ORAL | Status: DC
  Administered 2014-12-04 – 2014-12-06 (×3): 40 mg via ORAL
  Filled 2014-12-03 (×3): qty 1

## 2014-12-03 MED ORDER — LOPERAMIDE HCL 2 MG PO CAPS: 2.0000 mg | ORAL_CAPSULE | ORAL | Status: DC | PRN

## 2014-12-03 MED ORDER — MAGNESIUM OXIDE 400 (241.3 MG) MG PO TABS
400.0000 mg | ORAL_TABLET | Freq: Every day | ORAL | Status: DC
  Administered 2014-12-03 – 2014-12-05 (×3): 400 mg via ORAL
  Filled 2014-12-03 (×4): qty 1

## 2014-12-03 MED ORDER — TIZANIDINE HCL 2 MG PO TABS
2.0000 mg | ORAL_TABLET | Freq: Three times a day (TID) | ORAL | Status: DC | PRN
  Filled 2014-12-03: qty 1

## 2014-12-03 MED ORDER — METHOCARBAMOL 500 MG PO TABS
500.0000 mg | ORAL_TABLET | Freq: Four times a day (QID) | ORAL | Status: DC | PRN
  Administered 2014-12-06: 500 mg via ORAL
  Filled 2014-12-03: qty 1

## 2014-12-03 MED ORDER — PROSIGHT PO TABS
1.0000 | ORAL_TABLET | Freq: Two times a day (BID) | ORAL | Status: DC
  Administered 2014-12-03 – 2014-12-06 (×6): 1 via ORAL
  Filled 2014-12-03 (×7): qty 1

## 2014-12-03 MED ORDER — HYDROCODONE-ACETAMINOPHEN 5-325 MG PO TABS
1.0000 | ORAL_TABLET | Freq: Once | ORAL | Status: AC
  Administered 2014-12-03: 1 via ORAL
  Filled 2014-12-03: qty 1

## 2014-12-03 MED ORDER — SODIUM CHLORIDE 0.9 % IV SOLN
INTRAVENOUS | Status: DC
  Administered 2014-12-03 – 2014-12-05 (×3): via INTRAVENOUS

## 2014-12-03 MED ORDER — LEVOTHYROXINE SODIUM 112 MCG PO TABS
112.0000 ug | ORAL_TABLET | Freq: Every day | ORAL | Status: DC
  Administered 2014-12-04 – 2014-12-06 (×3): 112 ug via ORAL
  Filled 2014-12-03 (×4): qty 1

## 2014-12-03 MED ORDER — LORATADINE 10 MG PO TABS
10.0000 mg | ORAL_TABLET | Freq: Every day | ORAL | Status: DC
  Administered 2014-12-03 – 2014-12-06 (×4): 10 mg via ORAL
  Filled 2014-12-03 (×4): qty 1

## 2014-12-03 NOTE — Progress Notes (Signed)
PHARMACIST - PHYSICIAN ORDER COMMUNICATION  CONCERNING: P&T Medication Policy on Herbal Medications  DESCRIPTION:  This patient's order for:  melatonin  has been noted.  This product(s) is classified as an "herbal" or natural product. Due to a lack of definitive safety studies or FDA approval, nonstandard manufacturing practices, plus the potential risk of unknown drug-drug interactions while on inpatient medications, the Pharmacy and Therapeutics Committee does not permit the use of "herbal" or natural products of this type within [facility].   ACTION TAKEN: The pharmacy department is unable to verify this order at this time and the order has been discontinued. Please reevaluate patient's clinical condition at discharge and address if the herbal or natural product(s) should be resumed at that time.   Royetta Asal PharmD, BCPS 12/03/14 16:36

## 2014-12-03 NOTE — H&P (Signed)
[facility] History and Physical  [name] PPI:[id number] DOB: 23-Nov-1922 DOA: 12/03/2014  Referring physician: Dr. Ezequiel Essex, EDP PCP: Delphina Cahill, MD  Specialists:   Chief Complaint: Back and shoulder pain  HPI: [name] is a 81 y.o. female  With a history of recent left humeral fracture on August 1 after falling, hypertension, hypothyroidism, anemia that presented to the emergency room with complaints of increased back and shoulder pain. Patient was recently admitted to the hospital earlier this month and discharged to her sister living facility. She states that over the last several days she has been unable to ambulate due to pain. She complains of right-sided back pain along with left shoulder pain. Patient denies any other falls or trauma. She denies any chest pain, shortness of breath, abdominal pain, headache, dizziness, nausea, vomiting. Patient states that she has been having to use the bathroom when she drinks a lot of water therefore has had poor oral intake over the last several days. In the emergency department, patient had x-ray showing a new fracture as well as inferior pubic ramus fracture. Labs were noted to have hyponatremia with a sodium of 123.  [facility] called for admission.  Review of Systems:  Constitutional: Denies fever, chills, diaphoresis, appetite change and fatigue.  HEENT: Denies photophobia, eye pain, redness, hearing loss, ear pain, congestion, sore throat, rhinorrhea, sneezing, mouth sores, trouble swallowing, neck pain, neck stiffness and tinnitus.   Respiratory: Denies SOB, DOE, cough, chest tightness,  and wheezing.   Cardiovascular: Denies chest pain, palpitations and leg swelling.  Gastrointestinal: Denies nausea, vomiting, abdominal pain, diarrhea, constipation, blood in stool and abdominal distention.  Genitourinary: Denies dysuria, urgency, frequency, hematuria, flank pain and difficulty urinating.  Musculoskeletal: Back and shoulder  pain Skin: Denies pallor, rash and wound.  Neurological: Denies dizziness, seizures, syncope, weakness, light-headedness, numbness and headaches.  Hematological: Denies adenopathy. Easy bruising, personal or family bleeding history  Psychiatric/Behavioral: Denies suicidal ideation, mood changes, confusion, nervousness, sleep disturbance and agitation  Past Medical History  Diagnosis Date  . Hypertension   . Hypothyroidism   . Bradycardia   . Anemia   . Diverticulosis   . External hemorrhoids   . Constipation   . Macular degeneration   . Seasonal allergies   . Osteoporosis   . Degenerative joint disease   . Palpitations   . Hyperlipidemia   . GERD (gastroesophageal reflux disease)   . Pacemaker   . Thyrotoxicosis without mention of goiter or other cause, without mention of thyrotoxic crisis or storm   . Goiter, unspecified    Past Surgical History  Procedure Laterality Date  . Thyroidectomy    . Tonsillectomy    . Colonoscopy  November 2011    External hemorrhoids, diverticulosis, anal papillae  . Insert / replace / remove pacemaker    . Vertebroplasty    . Femur im nail Left 12/29/2012    Procedure: INTRAMEDULLARY (IM) NAIL INTERTROCHANTRIC;  Surgeon: Melina Schools, MD;  Location: [place];  Service: Orthopedics;  Laterality: Left;  . Permanent pacemaker insertion N/A 06/07/2011    Procedure: PERMANENT PACEMAKER INSERTION;  Surgeon: Evans Lance, MD;  Location: [facility] CATH LAB;  Service: Cardiovascular;  Laterality: N/A;  . Colonoscopy N/A 04/01/2014    Procedure: COLONOSCOPY;  Surgeon: Rogene Houston, MD;  Location: [facility];  Service: Endoscopy;  Laterality: N/A;  930   Social History:  reports that she has never smoked. She has never used smokeless tobacco. She reports that she does  not drink alcohol or use illicit drugs.  Allergies  Allergen Reactions  . Tramadol Itching and Nausea Only  . Sulfa Antibiotics Nausea And Vomiting    Family History  Problem Relation  Age of Onset  . Heart attack Mother     Deceased  . Cancer Father     Deceased, colon cancer age 62  . Cancer Brother     Deceased, throat and lung   Prior to Admission medications   Medication Sig Start Date End Date Taking? Authorizing Provider  acetaminophen (TYLENOL) 325 MG tablet Take 650 mg by mouth daily.    Yes Historical Provider, MD  acetaminophen (TYLENOL) 325 MG tablet Take 650 mg by mouth every 6 (six) hours as needed (for pain or fever).   Yes Historical Provider, MD  amLODipine (NORVASC) 5 MG tablet Take 1 tablet by mouth daily. 08/11/14  Yes Historical Provider, MD  aspirin 325 MG tablet Take 325 mg by mouth daily.   Yes Historical Provider, MD  beta carotene w/minerals (OCUVITE) tablet Take 1 tablet by mouth 2 (two) times daily.   Yes Historical Provider, MD  bisacodyl (FLEET) 10 MG/30ML ENEM Place 10 mg rectally every other day as needed (for bowel movement).   Yes Historical Provider, MD  cetirizine (ZYRTEC) 10 MG tablet Take 10 mg by mouth at bedtime.   Yes Historical Provider, MD  cycloSPORINE (RESTASIS) 0.05 % ophthalmic emulsion Place 1 drop into both eyes 2 (two) times daily. *Self Administration*   Yes Historical Provider, MD  docusate sodium (COLACE) 100 MG capsule Take 100 mg by mouth at bedtime. 01/01/13  Yes Ripudeep Krystal Eaton, MD  fluticasone (FLONASE) 50 MCG/ACT nasal spray Place 2 sprays into the nose daily. For allergies   Yes Historical Provider, MD  hydrALAZINE (APRESOLINE) 25 MG tablet Take 25 mg by mouth 2 (two) times daily.     Yes Historical Provider, MD  HYDROcodone-acetaminophen (NORCO/VICODIN) 5-325 MG per tablet Take 1 tablet by mouth every 4 (four) hours as needed for severe pain. 11/23/14  Yes Lezlie Octave Black, NP  Iron-FA-B Cmp-C-Biot-Probiotic (FUSION PLUS PO) Take 1 capsule by mouth daily.   Yes Historical Provider, MD  levothyroxine (SYNTHROID, LEVOTHROID) 112 MCG tablet Take 112 mcg by mouth daily before breakfast.   Yes Historical Provider, MD   lisinopril (PRINIVIL,ZESTRIL) 20 MG tablet Take 20 mg by mouth 2 (two) times daily.    Yes Historical Provider, MD  loperamide (IMODIUM) 2 MG capsule Take 2 mg by mouth every 4 (four) hours as needed for diarrhea or loose stools.   Yes Historical Provider, MD  Magnesium 250 MG TABS Take 1 tablet by mouth at bedtime.    Yes Historical Provider, MD  Melatonin 1 MG TABS Take 1 tablet by mouth at bedtime.   Yes Historical Provider, MD  methocarbamol (ROBAXIN) 500 MG tablet Take 1 tablet (500 mg total) by mouth every 6 (six) hours as needed for muscle spasms. 11/23/14  Yes Radene Gunning, NP  Multiple Vitamins-Minerals [facility] COMPLETE PO) Take 1 tablet by mouth 2 (two) times daily.   Yes Historical Provider, MD  omeprazole (PRILOSEC) 20 MG capsule Take 20 mg by mouth every morning.  12/12/12  Yes Historical Provider, MD  polyethylene glycol powder (GAVILAX) powder Take 1 Container by mouth daily.   Yes Historical Provider, MD  predniSONE (DELTASONE) 10 MG tablet Take 10 mg by mouth daily with breakfast.  09/17/14  Yes Historical Provider, MD  Probiotic Product (ALIGN) 4 MG CAPS Take 1  capsule by mouth daily.   Yes Historical Provider, MD  tiZANidine (ZANAFLEX) 2 MG tablet Take 2 mg by mouth every 8 (eight) hours as needed for muscle spasms.   Yes Historical Provider, MD  Wheat Dextrin (BENEFIBER DRINK MIX PO) Take 10 mLs by mouth daily with breakfast. Mix 2 teaspoonfuls with water and drink daily.   Yes Historical Provider, MD   Physical Exam: Filed Vitals:   12/03/14 1515  BP: 152/60  Pulse: 98  Temp:   Resp: 16     General: Well developed, thin, mild distress, appears stated age  HEENT: NCAT, PERRLA, EOMI, Anicteic Sclera, mucous membranes dry  Neck: Supple, no JVD, no masses  Cardiovascular: S1 S2 auscultated, no rubs, murmurs or gallops. Regular rate and rhythm.  Respiratory: Clear to auscultation bilaterally with equal chest rise  Abdomen: Soft, nontender, nondistended, + bowel  sounds  Extremities: warm dry without cyanosis clubbing or edema.  Extensive ecchymosis on the left hip and thigh, left upper arm and chest wall  Neuro: AAOx3, cranial nerves grossly intact. Strength 5/5 in patient's upper and lower extremities bilaterally  Skin: Without rashes exudates or nodules, multiple bruises  Psych: Normal affect and demeanor with intact judgement and insight  Labs on Admission:  Basic Metabolic Panel:  Recent Labs Lab 12/03/14 1257  NA 123*  K 4.4  CL 90*  CO2 25  GLUCOSE 138*  BUN 15  CREATININE 0.98  CALCIUM 8.7*   Liver Function Tests:  Recent Labs Lab 12/03/14 1257  AST 22  ALT 17  ALKPHOS 103  BILITOT 0.8  PROT 6.4*  ALBUMIN 3.6   No results for input(s): LIPASE, AMYLASE in the last 168 hours. No results for input(s): AMMONIA in the last 168 hours. CBC:  Recent Labs Lab 12/03/14 1257  WBC 11.2*  NEUTROABS 9.9*  HGB 9.4*  HCT 27.1*  MCV 94.8  PLT 583*   Cardiac Enzymes:  Recent Labs Lab 12/03/14 1257  TROPONINI <0.03    BNP (last 3 results) No results for input(s): BNP in the last 8760 hours.  ProBNP (last 3 results) No results for input(s): PROBNP in the last 8760 hours.  CBG: No results for input(s): GLUCAP in the last 168 hours.  Radiological Exams on Admission: Dg Chest 2 View  12/03/2014   CLINICAL DATA:  Fall 5 days ago.  Broke left shoulder.  EXAM: CHEST  2 VIEW  COMPARISON:  11/21/2014  FINDINGS: Again noted is fracture of the proximal left humerus. Stable position of the left dual chamber cardiac pacemaker. Heart size is within normal limits. Negative for a pneumothorax. Lungs are clear without airspace disease. Heart size is within normal limits. Again noted is a compression fracture in the mid thoracic spine. No large pleural effusions. There is a mildly displaced fracture of the right seventh rib.  IMPRESSION: Mildly displaced fracture of the right seventh rib.  Known fracture of the proximal left humerus.   No acute lung findings.   Electronically Signed   By: Markus Daft M.D.   On: 12/03/2014 14:12   Dg Lumbar Spine Complete  12/03/2014   CLINICAL DATA:  [place] 5 days ago. Persistent back, pelvic and left shoulder pain.  EXAM: PELVIS - 1-2 VIEW; LEFT SHOULDER - 2+ VIEW; LUMBAR SPINE - COMPLETE 4+ VIEW  COMPARISON:  Pelvis films 11/21/2014. Lumbar spine CT scan 09/08/2012  FINDINGS: Pelvis:  Stable gamma nail and dynamic left hip screw. The right hip is normally located. No acute right hip fracture. Ending  inferior pubic ramus fracture is noted on the right side. No definite superior ramus fracture. The pubic symphysis and SI joints are intact. Stable calcified fibroid.  Lumbar spine:  Advanced degenerative lumbar spondylosis with multilevel disc disease and facet disease. No acute fracture is identified. Mild compression deformities of T11 and T12 appears stable when compared to a prior scalp film from a CT scan. Moderate vascular calcifications without definite aneurysm. The SI joints are grossly normal.  Left shoulder:  There is a minimally displaced humeral neck fracture with mild medial rotation of the humeral head and slight impaction. The [facility] joint is intact. The visualized left ribs are intact.  IMPRESSION: 1. Mildly displaced left humeral neck fracture. 2. Right inferior pubic ramus fracture. 3. Advanced degenerative changes involving the spine but no acute fracture. Bold   Electronically Signed   By: Marijo Sanes M.D.   On: 12/03/2014 14:12   Dg Pelvis 1-2 Views  12/03/2014   CLINICAL DATA:  [place] 5 days ago. Persistent back, pelvic and left shoulder pain.  EXAM: PELVIS - 1-2 VIEW; LEFT SHOULDER - 2+ VIEW; LUMBAR SPINE - COMPLETE 4+ VIEW  COMPARISON:  Pelvis films 11/21/2014. Lumbar spine CT scan 09/08/2012  FINDINGS: Pelvis:  Stable gamma nail and dynamic left hip screw. The right hip is normally located. No acute right hip fracture. Ending inferior pubic ramus fracture is noted on the right side. No  definite superior ramus fracture. The pubic symphysis and SI joints are intact. Stable calcified fibroid.  Lumbar spine:  Advanced degenerative lumbar spondylosis with multilevel disc disease and facet disease. No acute fracture is identified. Mild compression deformities of T11 and T12 appears stable when compared to a prior scalp film from a CT scan. Moderate vascular calcifications without definite aneurysm. The SI joints are grossly normal.  Left shoulder:  There is a minimally displaced humeral neck fracture with mild medial rotation of the humeral head and slight impaction. The [facility] joint is intact. The visualized left ribs are intact.  IMPRESSION: 1. Mildly displaced left humeral neck fracture. 2. Right inferior pubic ramus fracture. 3. Advanced degenerative changes involving the spine but no acute fracture. Bold   Electronically Signed   By: Marijo Sanes M.D.   On: 12/03/2014 14:12   Dg Shoulder Left  12/03/2014   CLINICAL DATA:  [place] 5 days ago. Persistent back, pelvic and left shoulder pain.  EXAM: PELVIS - 1-2 VIEW; LEFT SHOULDER - 2+ VIEW; LUMBAR SPINE - COMPLETE 4+ VIEW  COMPARISON:  Pelvis films 11/21/2014. Lumbar spine CT scan 09/08/2012  FINDINGS: Pelvis:  Stable gamma nail and dynamic left hip screw. The right hip is normally located. No acute right hip fracture. Ending inferior pubic ramus fracture is noted on the right side. No definite superior ramus fracture. The pubic symphysis and SI joints are intact. Stable calcified fibroid.  Lumbar spine:  Advanced degenerative lumbar spondylosis with multilevel disc disease and facet disease. No acute fracture is identified. Mild compression deformities of T11 and T12 appears stable when compared to a prior scalp film from a CT scan. Moderate vascular calcifications without definite aneurysm. The SI joints are grossly normal.  Left shoulder:  There is a minimally displaced humeral neck fracture with mild medial rotation of the humeral head and slight  impaction. The [facility] joint is intact. The visualized left ribs are intact.  IMPRESSION: 1. Mildly displaced left humeral neck fracture. 2. Right inferior pubic ramus fracture. 3. Advanced degenerative changes involving the spine but no acute  fracture. Bold   Electronically Signed   By: Marijo Sanes M.D.   On: 12/03/2014 14:12    EKG: Independently reviewed. Sinus rhythm, 98  Assessment/Plan  Hypovolemic Hyponatremia, chronic -Patient admitted to medical floor -Her son, patient has always had a low sodium. States she drinks too much water -Patient appears to be dehydrated -Will place her on IVF and monitor BMP -Will obtain urine electrolytes  Right inferior pubic ramus fracture/Right seventh rib fracture/proximal left humeral fracture -Seen on chest and pelvic x-ray -Was not seen on previous admission -Treat conservatively with pain control and anti-spasmodics -Will consult PT and OT for evaluation  Leukocytosis/thrombocytosis -Likely acute phase reactants, will continue to monitor CBC -UA and chest x-ray negative for infection -Patient denies any recent cough.  Essential hypertension -Continue hydralazine, lisinopril  GERD -Continue PPI  Chronic anemia -Hemoglobin appears to be stable, continue iron supplementation -Continue to monitor CBC  Seasonal allergies -Continue loratadine, Flonase  Hypothyroidism -Continue Synthroid  DVT prophylaxis: Lovenox  Code Status: DNR  Condition: Guarded  Family Communication: Son at bedside. Admission, patients condition and plan of care including tests being ordered have been discussed with the patient and son who indicate understanding and agree with the plan and Code Status.  Disposition Plan: Admitted.    Time spent: 65 minutes  Tanikka Bresnan D.O. [facility] Pager [phone]  If 7PM-7AM, please contact night-coverage [email or web address] Password [facility] 12/03/2014, 3:29 PM

## 2014-12-03 NOTE — ED Provider Notes (Signed)
CSN: [id number]     Arrival date & time 12/03/14  1153 History   First MD Initiated Contact with Patient 12/03/14 1222     Chief Complaint  Patient presents with  . Back Pain  . Shoulder Pain     (Consider location/radiation/quality/duration/timing/severity/associated sxs/prior Treatment) HPI Comments: Patient had fallen August 1 and broke her left humerus. She was admitted to the hospital due to inability to ambulate. She returns today with worsening pain in her left shoulder as well as her right lower back. Denies any new falls. States she is unable to walk since she left the hospital. She complains of pain in the right back that is a burning sensation across her lower back and right side. She endorses frequency and urgency with urination. She denies fever. She denies vomiting. No headache, dizziness or visual changes. She does not take anticoagulation.  The history is provided by the EMS personnel, a relative and the patient. The history is limited by the condition of the patient.    Past Medical History  Diagnosis Date  . Hypertension   . Hypothyroidism   . Bradycardia   . Anemia   . Diverticulosis   . External hemorrhoids   . Constipation   . Macular degeneration   . Seasonal allergies   . Osteoporosis   . Degenerative joint disease   . Palpitations   . Hyperlipidemia   . GERD (gastroesophageal reflux disease)   . Pacemaker   . Thyrotoxicosis without mention of goiter or other cause, without mention of thyrotoxic crisis or storm   . Goiter, unspecified    Past Surgical History  Procedure Laterality Date  . Thyroidectomy    . Tonsillectomy    . Colonoscopy  November 2011    External hemorrhoids, diverticulosis, anal papillae  . Insert / replace / remove pacemaker    . Vertebroplasty    . Femur im nail Left 12/29/2012    Procedure: INTRAMEDULLARY (IM) NAIL INTERTROCHANTRIC;  Surgeon: Melina Schools, MD;  Location: [place];  Service: Orthopedics;  Laterality: Left;  .  Permanent pacemaker insertion N/A 06/07/2011    Procedure: PERMANENT PACEMAKER INSERTION;  Surgeon: Evans Lance, MD;  Location: [facility] CATH LAB;  Service: Cardiovascular;  Laterality: N/A;  . Colonoscopy N/A 04/01/2014    Procedure: COLONOSCOPY;  Surgeon: Rogene Houston, MD;  Location: [facility];  Service: Endoscopy;  Laterality: N/A;  930   Family History  Problem Relation Age of Onset  . Heart attack Mother     Deceased  . Cancer Father     Deceased, colon cancer age 56  . Cancer Brother     Deceased, throat and lung   Social History  Substance Use Topics  . Smoking status: Never Smoker   . Smokeless tobacco: Never Used  . Alcohol Use: No   OB History    No data available     Review of Systems  Constitutional: Positive for activity change and appetite change. Negative for fever.  Respiratory: Negative for cough, chest tightness and shortness of breath.   Cardiovascular: Negative for chest pain.  Gastrointestinal: Negative for nausea, vomiting and abdominal pain.  Genitourinary: Negative for dysuria.  Musculoskeletal: Positive for myalgias, back pain and arthralgias. Negative for neck pain.  Skin: Negative for rash.  Neurological: Negative for dizziness, weakness and headaches.  A complete 10 system review of systems was obtained and all systems are negative except as noted in the HPI and PMH.      Allergies  Tramadol and Sulfa antibiotics  Home Medications   Prior to Admission medications   Medication Sig Start Date End Date Taking? Authorizing Provider  acetaminophen (TYLENOL) 325 MG tablet Take 650 mg by mouth daily.    Yes Historical Provider, MD  acetaminophen (TYLENOL) 325 MG tablet Take 650 mg by mouth every 6 (six) hours as needed (for pain or fever).   Yes Historical Provider, MD  amLODipine (NORVASC) 5 MG tablet Take 1 tablet by mouth daily. 08/11/14  Yes Historical Provider, MD  aspirin 325 MG tablet Take 325 mg by mouth daily.   Yes Historical Provider,  MD  beta carotene w/minerals (OCUVITE) tablet Take 1 tablet by mouth 2 (two) times daily.   Yes Historical Provider, MD  bisacodyl (FLEET) 10 MG/30ML ENEM Place 10 mg rectally every other day as needed (for bowel movement).   Yes Historical Provider, MD  cetirizine (ZYRTEC) 10 MG tablet Take 10 mg by mouth at bedtime.   Yes Historical Provider, MD  cycloSPORINE (RESTASIS) 0.05 % ophthalmic emulsion Place 1 drop into both eyes 2 (two) times daily. *Self Administration*   Yes Historical Provider, MD  docusate sodium (COLACE) 100 MG capsule Take 100 mg by mouth at bedtime. 01/01/13  Yes Ripudeep Krystal Eaton, MD  fluticasone (FLONASE) 50 MCG/ACT nasal spray Place 2 sprays into the nose daily. For allergies   Yes Historical Provider, MD  hydrALAZINE (APRESOLINE) 25 MG tablet Take 25 mg by mouth 2 (two) times daily.     Yes Historical Provider, MD  HYDROcodone-acetaminophen (NORCO/VICODIN) 5-325 MG per tablet Take 1 tablet by mouth every 4 (four) hours as needed for severe pain. 11/23/14  Yes Lezlie Octave Black, NP  Iron-FA-B Cmp-C-Biot-Probiotic (FUSION PLUS PO) Take 1 capsule by mouth daily.   Yes Historical Provider, MD  levothyroxine (SYNTHROID, LEVOTHROID) 112 MCG tablet Take 112 mcg by mouth daily before breakfast.   Yes Historical Provider, MD  lisinopril (PRINIVIL,ZESTRIL) 20 MG tablet Take 20 mg by mouth 2 (two) times daily.    Yes Historical Provider, MD  loperamide (IMODIUM) 2 MG capsule Take 2 mg by mouth every 4 (four) hours as needed for diarrhea or loose stools.   Yes Historical Provider, MD  Magnesium 250 MG TABS Take 1 tablet by mouth at bedtime.    Yes Historical Provider, MD  Melatonin 1 MG TABS Take 1 tablet by mouth at bedtime.   Yes Historical Provider, MD  methocarbamol (ROBAXIN) 500 MG tablet Take 1 tablet (500 mg total) by mouth every 6 (six) hours as needed for muscle spasms. 11/23/14  Yes Radene Gunning, NP  Multiple Vitamins-Minerals [facility] COMPLETE PO) Take 1 tablet by mouth 2 (two) times  daily.   Yes Historical Provider, MD  omeprazole (PRILOSEC) 20 MG capsule Take 20 mg by mouth every morning.  12/12/12  Yes Historical Provider, MD  polyethylene glycol powder (GAVILAX) powder Take 1 Container by mouth daily.   Yes Historical Provider, MD  predniSONE (DELTASONE) 10 MG tablet Take 10 mg by mouth daily with breakfast.  09/17/14  Yes Historical Provider, MD  Probiotic Product (ALIGN) 4 MG CAPS Take 1 capsule by mouth daily.   Yes Historical Provider, MD  tiZANidine (ZANAFLEX) 2 MG tablet Take 2 mg by mouth every 8 (eight) hours as needed for muscle spasms.   Yes Historical Provider, MD  Wheat Dextrin (BENEFIBER DRINK MIX PO) Take 10 mLs by mouth daily with breakfast. Mix 2 teaspoonfuls with water and drink daily.   Yes Historical Provider, MD  BP 145/56 mmHg  Pulse 97  Temp(Src) 98.4 F (36.9 C) (Oral)  Resp 18  Ht 5\' 5"  (1.651 m)  Wt 145 lb (65.772 kg)  BMI 24.13 kg/m2  SpO2 99% Physical Exam  Constitutional: She is oriented to person, place, and time. She appears well-developed and well-nourished. No distress.  HENT:  Head: Normocephalic and atraumatic.  Mouth/Throat: Oropharynx is clear and moist. No oropharyngeal exudate.  Eyes: Conjunctivae and EOM are normal. Pupils are equal, round, and reactive to light.  Neck: Normal range of motion. Neck supple.  No C spine tenderness  Cardiovascular: Normal rate, regular rhythm, normal heart sounds and intact distal pulses.   No murmur heard. Pulmonary/Chest: Effort normal and breath sounds normal. No respiratory distress. She exhibits tenderness.  Abdominal: Soft. There is no tenderness. There is no rebound and no guarding.  Musculoskeletal: Normal range of motion. She exhibits tenderness. She exhibits no edema.  Paraspinal lumbar tenderness bilaterally. No midline tenderness. Extensive ecchymosis to left hip and thigh. Ecchymosis to left upper arm and chest wall. Intact radial pulse. Intact cardinal hand movements. No  shortening or external rotation of either leg. Pelvis is stable. Intact PT pulses  Neurological: She is alert and oriented to person, place, and time. No cranial nerve deficit. She exhibits normal muscle tone. Coordination normal.  No facial asymmetry. Moving all extremities.  L arm in sling.  Skin: Skin is warm.  Psychiatric: She has a normal mood and affect. Her behavior is normal.  Nursing note and vitals reviewed.   ED Course  Procedures (including critical care time) Labs Review Labs Reviewed  URINALYSIS, ROUTINE W REFLEX MICROSCOPIC (NOT AT [facility]) - Abnormal; Notable for the following:    Leukocytes, UA SMALL (*)    All other components within normal limits  CBC WITH DIFFERENTIAL/PLATELET - Abnormal; Notable for the following:    WBC 11.2 (*)    RBC 2.86 (*)    Hemoglobin 9.4 (*)    HCT 27.1 (*)    RDW 18.2 (*)    Platelets 583 (*)    Neutrophils Relative % 89 (*)    Neutro Abs 9.9 (*)    Lymphocytes Relative 5 (*)    Lymphs Abs 0.6 (*)    All other components within normal limits  COMPREHENSIVE METABOLIC PANEL - Abnormal; Notable for the following:    Sodium 123 (*)    Chloride 90 (*)    Glucose, Bld 138 (*)    Calcium 8.7 (*)    Total Protein 6.4 (*)    GFR calc non Af Amer 50 (*)    GFR calc Af Amer 58 (*)    All other components within normal limits  TROPONIN I  URINE MICROSCOPIC-ADD ON  CBC  CREATININE, SERUM  SODIUM, URINE, RANDOM  CREATININE, URINE, RANDOM  OSMOLALITY, URINE  OSMOLALITY  BASIC METABOLIC PANEL  CBC    Imaging Review Dg Chest 2 View  12/03/2014   CLINICAL DATA:  Fall 5 days ago.  Broke left shoulder.  EXAM: CHEST  2 VIEW  COMPARISON:  11/21/2014  FINDINGS: Again noted is fracture of the proximal left humerus. Stable position of the left dual chamber cardiac pacemaker. Heart size is within normal limits. Negative for a pneumothorax. Lungs are clear without airspace disease. Heart size is within normal limits. Again noted is a compression  fracture in the mid thoracic spine. No large pleural effusions. There is a mildly displaced fracture of the right seventh rib.  IMPRESSION: Mildly displaced fracture of  the right seventh rib.  Known fracture of the proximal left humerus.  No acute lung findings.   Electronically Signed   By: Markus Daft M.D.   On: 12/03/2014 14:12   Dg Lumbar Spine Complete  12/03/2014   CLINICAL DATA:  [place] 5 days ago. Persistent back, pelvic and left shoulder pain.  EXAM: PELVIS - 1-2 VIEW; LEFT SHOULDER - 2+ VIEW; LUMBAR SPINE - COMPLETE 4+ VIEW  COMPARISON:  Pelvis films 11/21/2014. Lumbar spine CT scan 09/08/2012  FINDINGS: Pelvis:  Stable gamma nail and dynamic left hip screw. The right hip is normally located. No acute right hip fracture. Ending inferior pubic ramus fracture is noted on the right side. No definite superior ramus fracture. The pubic symphysis and SI joints are intact. Stable calcified fibroid.  Lumbar spine:  Advanced degenerative lumbar spondylosis with multilevel disc disease and facet disease. No acute fracture is identified. Mild compression deformities of T11 and T12 appears stable when compared to a prior scalp film from a CT scan. Moderate vascular calcifications without definite aneurysm. The SI joints are grossly normal.  Left shoulder:  There is a minimally displaced humeral neck fracture with mild medial rotation of the humeral head and slight impaction. The [facility] joint is intact. The visualized left ribs are intact.  IMPRESSION: 1. Mildly displaced left humeral neck fracture. 2. Right inferior pubic ramus fracture. 3. Advanced degenerative changes involving the spine but no acute fracture. Bold   Electronically Signed   By: Marijo Sanes M.D.   On: 12/03/2014 14:12   Dg Pelvis 1-2 Views  12/03/2014   CLINICAL DATA:  [place] 5 days ago. Persistent back, pelvic and left shoulder pain.  EXAM: PELVIS - 1-2 VIEW; LEFT SHOULDER - 2+ VIEW; LUMBAR SPINE - COMPLETE 4+ VIEW  COMPARISON:  Pelvis films  11/21/2014. Lumbar spine CT scan 09/08/2012  FINDINGS: Pelvis:  Stable gamma nail and dynamic left hip screw. The right hip is normally located. No acute right hip fracture. Ending inferior pubic ramus fracture is noted on the right side. No definite superior ramus fracture. The pubic symphysis and SI joints are intact. Stable calcified fibroid.  Lumbar spine:  Advanced degenerative lumbar spondylosis with multilevel disc disease and facet disease. No acute fracture is identified. Mild compression deformities of T11 and T12 appears stable when compared to a prior scalp film from a CT scan. Moderate vascular calcifications without definite aneurysm. The SI joints are grossly normal.  Left shoulder:  There is a minimally displaced humeral neck fracture with mild medial rotation of the humeral head and slight impaction. The [facility] joint is intact. The visualized left ribs are intact.  IMPRESSION: 1. Mildly displaced left humeral neck fracture. 2. Right inferior pubic ramus fracture. 3. Advanced degenerative changes involving the spine but no acute fracture. Bold   Electronically Signed   By: Marijo Sanes M.D.   On: 12/03/2014 14:12   Dg Shoulder Left  12/03/2014   CLINICAL DATA:  [place] 5 days ago. Persistent back, pelvic and left shoulder pain.  EXAM: PELVIS - 1-2 VIEW; LEFT SHOULDER - 2+ VIEW; LUMBAR SPINE - COMPLETE 4+ VIEW  COMPARISON:  Pelvis films 11/21/2014. Lumbar spine CT scan 09/08/2012  FINDINGS: Pelvis:  Stable gamma nail and dynamic left hip screw. The right hip is normally located. No acute right hip fracture. Ending inferior pubic ramus fracture is noted on the right side. No definite superior ramus fracture. The pubic symphysis and SI joints are intact. Stable calcified fibroid.  Lumbar spine:  Advanced degenerative lumbar spondylosis with multilevel disc disease and facet disease. No acute fracture is identified. Mild compression deformities of T11 and T12 appears stable when compared to a prior scalp  film from a CT scan. Moderate vascular calcifications without definite aneurysm. The SI joints are grossly normal.  Left shoulder:  There is a minimally displaced humeral neck fracture with mild medial rotation of the humeral head and slight impaction. The [facility] joint is intact. The visualized left ribs are intact.  IMPRESSION: 1. Mildly displaced left humeral neck fracture. 2. Right inferior pubic ramus fracture. 3. Advanced degenerative changes involving the spine but no acute fracture. Bold   Electronically Signed   By: Marijo Sanes M.D.   On: 12/03/2014 14:12   I, Wyvonnia Dusky, Marylen Zuk, personally reviewed and evaluated these images and lab results as part of my medical decision-making.   EKG Interpretation   Date/Time:  Friday December 03 2014 12:54:02 EDT Ventricular Rate:  98 PR Interval:  176 QRS Duration: 82 QT Interval:  342 QTC Calculation: 437 R Axis:   60 Text Interpretation:  Sinus tachycardia Atrial premature complexes No  significant change was found Confirmed by Wyvonnia Dusky  MD, Camera Krienke [phone]) on  12/03/2014 1:07:58 PM      MDM   Final diagnoses:  Hyponatremia  Pelvic fracture, closed, initial encounter  Humerus fracture, left, sequela   Back pain and shoulder pain after a fall 11 days ago. States unable to ambulate. No new falls.  Extensive bruising to L arm, chest wall, L hip and thigh.  Xrays during previous admission showed only proximal humerus fracture.  Today with R 7th rib fracture, and R inferior pubic ramus fracture. UA negative. Labs with sodium 123.  Patient appears dehydrated.  Gentle IVF given.  Admisison needed for hyponatremia and deconditioning.  D/w DR. Mikhail.  Ezequiel Essex, MD 12/03/14 1807

## 2014-12-03 NOTE — ED Notes (Signed)
Patient transported to X-ray 

## 2014-12-03 NOTE — ED Notes (Signed)
Pt presents via [facility] c/o left shoulder and lower back pain after falling at [facility] [place] on 8/1.  She reports that she broke her left shoulder during that fall and has been attending physical therapy since then.  Today her complaint results because she was using her arm in the restroom and aggravated her shoulder pain as well as her lower back.  Pain is reported as 8/10 and pulling/burning with movement.  She has visible bruising on her left side due to the recent fall.  She denies headache, dizziness, visual changes, and other symptoms.  EMS states that all vitals were normal en route.

## 2014-12-03 NOTE — ED Notes (Signed)
Bed: WA20 Expected date:  Expected time:  Means of arrival:  Comments: 44F  SHOULDER PAIN

## 2014-12-04 LAB — OSMOLALITY, URINE: OSMOLALITY UR: 143 mosm/kg — AB (ref 390–1090)

## 2014-12-04 LAB — BASIC METABOLIC PANEL
ANION GAP: 10 (ref 5–15)
BUN: 14 mg/dL (ref 6–20)
CHLORIDE: 98 mmol/L — AB (ref 101–111)
CO2: 21 mmol/L — AB (ref 22–32)
Calcium: 8.8 mg/dL — ABNORMAL LOW (ref 8.9–10.3)
Creatinine, Ser: 0.95 mg/dL (ref 0.44–1.00)
GFR calc Af Amer: 60 mL/min — ABNORMAL LOW (ref >60)
GFR calc non Af Amer: 52 mL/min — ABNORMAL LOW (ref >60)
Glucose, Bld: 84 mg/dL (ref 65–99)
Potassium: 4 mmol/L (ref 3.5–5.1)
Sodium: 129 mmol/L — ABNORMAL LOW (ref 135–145)

## 2014-12-04 LAB — CBC
HCT: 29.2 % — ABNORMAL LOW (ref 36.0–46.0)
Hemoglobin: 9.5 g/dL — ABNORMAL LOW (ref 12.0–15.0)
MCH: 31.8 pg (ref 26.0–34.0)
MCHC: 32.5 g/dL (ref 30.0–36.0)
MCV: 97.7 fL (ref 78.0–100.0)
Platelets: 558 10*3/uL — ABNORMAL HIGH (ref 150–400)
RBC: 2.99 MIL/uL — ABNORMAL LOW (ref 3.87–5.11)
RDW: 18.7 % — AB (ref 11.5–15.5)
WBC: 8 10*3/uL (ref 4.0–10.5)

## 2014-12-04 LAB — OSMOLALITY: OSMOLALITY: 266 mosm/kg — AB (ref 275–300)

## 2014-12-04 LAB — MRSA PCR SCREENING: MRSA BY PCR: NEGATIVE

## 2014-12-04 NOTE — Progress Notes (Signed)
[facility]                                                                              Patient Demographics  [name], is a 81 y.o. female, DOB - [birth year], HAL:[id number]  Admit date - 12/03/2014   Admitting Physician Cristal Ford, DO  Outpatient Primary MD for the patient is Delphina Cahill, MD  LOS - 1   Chief Complaint  Patient presents with  . Back Pain  . Shoulder Pain      HPI on 12/03/2014  [name] is a 81 y.o. female with a history of recent left humeral fracture on August 1 after falling, hypertension, hypothyroidism, anemia that presented to the emergency room with complaints of increased back and shoulder pain. Patient was recently admitted to the hospital earlier this month and discharged to her sister living facility. She states that over the last several days she has been unable to ambulate due to pain. She complains of right-sided back pain along with left shoulder pain. Patient denies any other falls or trauma. She denies any chest pain, shortness of breath, abdominal pain, headache, dizziness, nausea, vomiting. Patient states that she has been having to use the bathroom when she drinks a lot of water therefore has had poor oral intake over the last several days. In the emergency department, patient had x-ray showing a new fracture as well as inferior pubic ramus fracture. Labs were noted to have hyponatremia with a sodium of 123. TRH called for admission.  Assessment & Plan   Hypovolemic Hyponatremia, chronic -Improving, Na 129 -Her son, patient has always had a low sodium. States she drinks too much water -Continue IVF and monitor BMP  Right inferior pubic ramus fracture/Right seventh rib fracture/proximal left humeral fracture -Seen on chest and pelvic x-ray -Was not seen on previous admission -Treat conservatively with pain control and anti-spasmodics -PT/OT consulted and pending evaluation   Leukocytosis/thrombocytosis -Likely acute phase  reactants -Leukocytosis resolved, continue to monitor CBC -UA and chest x-ray negative for infection -Patient denies any recent cough.  Essential hypertension -Continue hydralazine, lisinopril  GERD -Continue PPI  Chronic anemia -Hemoglobin appears to be stable, continue iron supplementation -Continue to monitor CBC -Hemoglobin 9.5 (baseline 9)  Seasonal allergies -Continue loratadine, Flonase  Hypothyroidism -Continue Synthroid  Code Status: DNR  Family Communication: None at bedside  Disposition Plan: Admitted.  Pending PT consult.  Patient will likely need SNF  Time Spent in minutes   30 minutes  Procedures  None  Consults   None  DVT Prophylaxis  Lovenox  Lab Results  Component Value Date   PLT 558* 12/04/2014    Medications  Scheduled Meds: . ALIGN  1 capsule Oral Daily  . amLODipine  5 mg Oral Daily  . aspirin  325 mg Oral Daily  . cycloSPORINE  1 drop Both Eyes BID  . docusate sodium  100 mg Oral QHS  . enoxaparin (LOVENOX) injection  40 mg Subcutaneous Q24H  . fluticasone  2 spray Each Nare Daily  . folic acid-pyridoxine-cyancobalamin  1 tablet Oral Daily  . hydrALAZINE  25 mg Oral BID  . hydrocortisone   Rectal TID  . levothyroxine  112  mcg Oral QAC breakfast  . lisinopril  20 mg Oral BID  . loratadine  10 mg Oral Daily  . magnesium oxide  400 mg Oral QHS  . multivitamin  1 tablet Oral BID  . pantoprazole  40 mg Oral Daily  . polyethylene glycol  17 g Oral Daily  . predniSONE  10 mg Oral Q breakfast   Continuous Infusions: . sodium chloride 75 mL/hr at 12/03/14 1753   PRN Meds:.acetaminophen, HYDROcodone-acetaminophen, loperamide, methocarbamol, morphine injection, ondansetron **OR** ondansetron (ZOFRAN) IV, sodium phosphate, tiZANidine  Antibiotics    Anti-infectives    None      Subjective:   [name] seen and examined today.  Patient continues to complain of pain everywhere.  She states it hurts every time she moves.  She  feels she cannot feed herself due to the pain.  Denies  dizziness, chest pain, shortness of breath, abdominal pain, N/V/D/C.   Objective:   Filed Vitals:   12/03/14 1515 12/03/14 1534 12/03/14 2038 12/04/14 0514  BP: 152/60 145/56 125/53 150/70  Pulse: 98 97 92 87  Temp:  98.4 F (36.9 C) 98.3 F (36.8 C) 98.3 F (36.8 C)  TempSrc:  Oral Oral Oral  Resp: 16 18 18 18   Height:      Weight:      SpO2: 94% 99% 98% 97%    Wt Readings from Last 3 Encounters:  12/03/14 65.772 kg (145 lb)  11/21/14 67.8 kg (149 lb 7.6 oz)  09/23/14 65.318 kg (144 lb)     Intake/Output Summary (Last 24 hours) at 12/04/14 1149 Last data filed at 12/04/14 0600  Gross per 24 hour  Intake 908.75 ml  Output      0 ml  Net 908.75 ml    Exam  General: Well developed, thin, no distress  HEENT: NCAT, mucous membranes moist  Cardiovascular: S1 S2 auscultated, RRR, 2/6 SEM  Respiratory: Clear to auscultation bilaterally with equal chest rise  Abdomen: Soft, nontender, nondistended, + bowel sounds  Extremities: warm dry without cyanosis clubbing or edema. Extensive ecchymosis on the left hip and thigh, left upper arm and chest wall. Left arm in sling  Neuro: AAOx3, nonfocal  Skin: Without rashes exudates or nodules, multiple bruises  Psych: Appropriate mood and affect  Data Review   Micro Results Recent Results (from the past 240 hour(s))  MRSA PCR Screening     Status: None   Collection Time: 12/04/14  5:00 AM  Result Value Ref Range Status   MRSA by PCR NEGATIVE NEGATIVE Final    Comment:        The GeneXpert MRSA Assay (FDA approved for NASAL specimens only), is one component of a comprehensive MRSA colonization surveillance program. It is not intended to diagnose MRSA infection nor to guide or monitor treatment for MRSA infections.     Radiology Reports Dg Chest 1 View  11/21/2014   CLINICAL DATA:  Fall.  EXAM: CHEST  1 VIEW  COMPARISON:  12/27/2012  FINDINGS: Left-sided  pacemaker is intact and unchanged. Lungs are adequately inflated without consolidation, effusion or pneumothorax. Cardiomediastinal silhouette is within normal. Known compression fracture over the mid thoracic spine. Acute left humeral neck fracture. Remainder of the exam is unchanged.  IMPRESSION: No acute cardiopulmonary disease.  Known acute left humeral neck fracture.  Stable old mid thoracic spine compression fracture.   Electronically Signed   By: Marin Olp M.D.   On: 11/21/2014 19:38   Dg Chest 2 View  12/03/2014  CLINICAL DATA:  Fall 5 days ago.  Broke left shoulder.  EXAM: CHEST  2 VIEW  COMPARISON:  11/21/2014  FINDINGS: Again noted is fracture of the proximal left humerus. Stable position of the left dual chamber cardiac pacemaker. Heart size is within normal limits. Negative for a pneumothorax. Lungs are clear without airspace disease. Heart size is within normal limits. Again noted is a compression fracture in the mid thoracic spine. No large pleural effusions. There is a mildly displaced fracture of the right seventh rib.  IMPRESSION: Mildly displaced fracture of the right seventh rib.  Known fracture of the proximal left humerus.  No acute lung findings.   Electronically Signed   By: Markus Daft M.D.   On: 12/03/2014 14:12   Dg Lumbar Spine Complete  12/03/2014   CLINICAL DATA:  [place] 5 days ago. Persistent back, pelvic and left shoulder pain.  EXAM: PELVIS - 1-2 VIEW; LEFT SHOULDER - 2+ VIEW; LUMBAR SPINE - COMPLETE 4+ VIEW  COMPARISON:  Pelvis films 11/21/2014. Lumbar spine CT scan 09/08/2012  FINDINGS: Pelvis:  Stable gamma nail and dynamic left hip screw. The right hip is normally located. No acute right hip fracture. Ending inferior pubic ramus fracture is noted on the right side. No definite superior ramus fracture. The pubic symphysis and SI joints are intact. Stable calcified fibroid.  Lumbar spine:  Advanced degenerative lumbar spondylosis with multilevel disc disease and facet  disease. No acute fracture is identified. Mild compression deformities of T11 and T12 appears stable when compared to a prior scalp film from a CT scan. Moderate vascular calcifications without definite aneurysm. The SI joints are grossly normal.  Left shoulder:  There is a minimally displaced humeral neck fracture with mild medial rotation of the humeral head and slight impaction. The [facility] joint is intact. The visualized left ribs are intact.  IMPRESSION: 1. Mildly displaced left humeral neck fracture. 2. Right inferior pubic ramus fracture. 3. Advanced degenerative changes involving the spine but no acute fracture. Bold   Electronically Signed   By: Marijo Sanes M.D.   On: 12/03/2014 14:12   Dg Pelvis 1-2 Views  12/03/2014   CLINICAL DATA:  [place] 5 days ago. Persistent back, pelvic and left shoulder pain.  EXAM: PELVIS - 1-2 VIEW; LEFT SHOULDER - 2+ VIEW; LUMBAR SPINE - COMPLETE 4+ VIEW  COMPARISON:  Pelvis films 11/21/2014. Lumbar spine CT scan 09/08/2012  FINDINGS: Pelvis:  Stable gamma nail and dynamic left hip screw. The right hip is normally located. No acute right hip fracture. Ending inferior pubic ramus fracture is noted on the right side. No definite superior ramus fracture. The pubic symphysis and SI joints are intact. Stable calcified fibroid.  Lumbar spine:  Advanced degenerative lumbar spondylosis with multilevel disc disease and facet disease. No acute fracture is identified. Mild compression deformities of T11 and T12 appears stable when compared to a prior scalp film from a CT scan. Moderate vascular calcifications without definite aneurysm. The SI joints are grossly normal.  Left shoulder:  There is a minimally displaced humeral neck fracture with mild medial rotation of the humeral head and slight impaction. The [facility] joint is intact. The visualized left ribs are intact.  IMPRESSION: 1. Mildly displaced left humeral neck fracture. 2. Right inferior pubic ramus fracture. 3. Advanced degenerative  changes involving the spine but no acute fracture. Bold   Electronically Signed   By: Marijo Sanes M.D.   On: 12/03/2014 14:12   Dg Pelvis 1-2 Views  11/21/2014  CLINICAL DATA:  Fall.  Pelvic pain.  EXAM: PELVIS - 1-2 VIEW  COMPARISON:  12/29/2012.  FINDINGS: LEFT hip gamma nail is present. The intertrochanteric fracture has healed. Calcified fibroid in the anatomic pelvis. Sacral arcades are obscured by overlying stool and bowel gas. Proximal RIGHT femur appears normal. RIGHT hip joint space appears preserved. The obturator rings appear intact bilaterally. Lower lumbar spondylosis.  IMPRESSION: No acute osseous abnormality. LEFT hip gamma nail with healed intertrochanteric fracture.   Electronically Signed   By: Dereck Ligas M.D.   On: 11/21/2014 19:40   Ct Head Wo Contrast  11/21/2014   CLINICAL DATA:  [place], landing on left side.  EXAM: CT HEAD WITHOUT CONTRAST  TECHNIQUE: Contiguous axial images were obtained from the base of the skull through the vertex without intravenous contrast.  COMPARISON:  None.  FINDINGS: There is no intracranial hemorrhage, mass or evidence of acute infarction. There is no extra-axial fluid collection. Gray matter and white matter appear normal. Cerebral volume is normal for age. Brainstem and posterior fossa are unremarkable. The CSF spaces appear normal.  The bony structures are intact. 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 Remainder of the visible paranasal sinuses are clear.  IMPRESSION: Normal brain.  No acute findings.   Electronically Signed   By: Andreas Newport M.D.   On: 11/21/2014 22:38   Dg Shoulder Left  12/03/2014   CLINICAL DATA:  [place] 5 days ago. Persistent back, pelvic and left shoulder pain.  EXAM: PELVIS - 1-2 VIEW; LEFT SHOULDER - 2+ VIEW; LUMBAR SPINE - COMPLETE 4+ VIEW  COMPARISON:  Pelvis films 11/21/2014. Lumbar spine CT scan 09/08/2012  FINDINGS: Pelvis:  Stable gamma nail and dynamic left hip screw. The right hip is normally located.  No acute right hip fracture. Ending inferior pubic ramus fracture is noted on the right side. No definite superior ramus fracture. The pubic symphysis and SI joints are intact. Stable calcified fibroid.  Lumbar spine:  Advanced degenerative lumbar spondylosis with multilevel disc disease and facet disease. No acute fracture is identified. Mild compression deformities of T11 and T12 appears stable when compared to a prior scalp film from a CT scan. Moderate vascular calcifications without definite aneurysm. The SI joints are grossly normal.  Left shoulder:  There is a minimally displaced humeral neck fracture with mild medial rotation of the humeral head and slight impaction. The [facility] joint is intact. The visualized left ribs are intact.  IMPRESSION: 1. Mildly displaced left humeral neck fracture. 2. Right inferior pubic ramus fracture. 3. Advanced degenerative changes involving the spine but no acute fracture. Bold   Electronically Signed   By: Marijo Sanes M.D.   On: 12/03/2014 14:12   Dg Shoulder Left  11/21/2014   CLINICAL DATA:  Fall injuring left side with left shoulder pain.  EXAM: LEFT SHOULDER - 2+ VIEW  COMPARISON:  Chest x-ray 12/27/2012  FINDINGS: There is a displaced humeral neck fracture no evidence of dislocation. Mild degenerate change of the [facility] joint. Left-sided cardiac pacemaker is present. Calcified plaque over the aortic arch is present.  IMPRESSION: Mildly displaced humeral neck fracture.   Electronically Signed   By: Marin Olp M.D.   On: 11/21/2014 19:33   Dg Humerus Left  11/21/2014   CLINICAL DATA:  Fall.  Proximal LEFT humerus pain.  EXAM: LEFT HUMERUS - 2+ VIEW  COMPARISON:  None.  FINDINGS: Pacemaker power pack is evident in the LEFT upper chest. Moderate AC joint osteoarthritis with undersurface spurring.  Proximal LEFT humerus fracture is present. The fracture is comminuted and there is mild impaction. Diffuse osteopenia. Maximal radiographic displacement of proximal humerus  fragments is 9 mm. This suggests a 1 part fracture.  IMPRESSION: Comminuted proximal LEFT humerus fracture. Radiographically this is a 1 part proximal humerus fracture.   Electronically Signed   By: Dereck Ligas M.D.   On: 11/21/2014 19:34   Dg Femur Min 2 Views Left  11/21/2014   CLINICAL DATA:  Fall.  EXAM: LEFT FEMUR 2 VIEWS  COMPARISON:  12/29/2012 and 12/27/2012  FINDINGS: Exam demonstrates diffuse decreased bone mineralization. There are minimal degenerative changes of the left hip. Left femoral hardware intact and unchanged. No evidence of acute fracture or dislocation. Chronic fragment just medial to the femoral neck.  IMPRESSION: No acute findings.   Electronically Signed   By: Marin Olp M.D.   On: 11/21/2014 19:36    CBC  Recent Labs Lab 12/03/14 1257 12/03/14 1800 12/04/14 0512  WBC 11.2* 10.0 8.0  HGB 9.4* 10.0* 9.5*  HCT 27.1* 29.1* 29.2*  PLT 583* 599* 558*  MCV 94.8 95.7 97.7  MCH 32.9 32.9 31.8  MCHC 34.7 34.4 32.5  RDW 18.2* 18.2* 18.7*  LYMPHSABS 0.6*  --   --   MONOABS 0.7  --   --   EOSABS 0.0  --   --   BASOSABS 0.0  --   --     Chemistries   Recent Labs Lab 12/03/14 1257 12/03/14 1800 12/04/14 0512  NA 123*  --  129*  K 4.4  --  4.0  CL 90*  --  98*  CO2 25  --  21*  GLUCOSE 138*  --  84  BUN 15  --  14  CREATININE 0.98 0.89 0.95  CALCIUM 8.7*  --  8.8*  AST 22  --   --   ALT 17  --   --   ALKPHOS 103  --   --   BILITOT 0.8  --   --    ------------------------------------------------------------------------------------------------------------------ estimated creatinine clearance is 36.1 mL/min (by C-G formula based on Cr of 0.95). ------------------------------------------------------------------------------------------------------------------ No results for input(s): HGBA1C in the last 72 hours. ------------------------------------------------------------------------------------------------------------------ No results for input(s):  CHOL, HDL, LDLCALC, TRIG, CHOLHDL, LDLDIRECT in the last 72 hours. ------------------------------------------------------------------------------------------------------------------ No results for input(s): TSH, T4TOTAL, T3FREE, THYROIDAB in the last 72 hours.  Invalid input(s): FREET3 ------------------------------------------------------------------------------------------------------------------ No results for input(s): VITAMINB12, FOLATE, FERRITIN, TIBC, IRON, RETICCTPCT in the last 72 hours.  Coagulation profile No results for input(s): INR, PROTIME in the last 168 hours.  No results for input(s): DDIMER in the last 72 hours.  Cardiac Enzymes  Recent Labs Lab 12/03/14 1257  TROPONINI <0.03   ------------------------------------------------------------------------------------------------------------------ Invalid input(s): POCBNP    Early Steel D.O. on 12/04/2014 at 11:49 AM  Between 7am to 7pm - Pager - [phone]  After 7pm go to [email or web address] - password [email or web address]  And look for the night coverage person covering for me after hours  [facility] Office  [phone]

## 2014-12-04 NOTE — Evaluation (Signed)
Physical Therapy Evaluation Patient Details Name: [name] MRN: [id number] DOB: [birth year] Today's Date: 12/04/2014   History of Present Illness  [name] is a 81 y.o. female with a history of recent left humeral fracture on August 1 after fall, adm with incr pain and inability to amb (from ALF).  Pt found to have R inferior pubic ramus fracture and R 7th rib fracture.     PMHx: hypertension, hypothyroidism, macular degeneration, pacemaker  Clinical Impression  Pt admitted with above diagnosis. Pt currently with functional limitations due to the deficits listed below (see PT Problem List).  Pt will benefit from skilled PT to increase their independence and safety with mobility to allow discharge to the venue listed below.   Will need SNF placement post acute, will continue to follow; pt pleasant and willing to work with PT but quite limited by pain     Follow Up Recommendations SNF    Equipment Recommendations  None recommended by PT    Recommendations for Other Services       Precautions / Restrictions Precautions Precautions: Shoulder Shoulder Interventions: Shoulder sling/immobilizer Precaution Comments: recent L humeral fracture. Required Braces or Orthoses: Sling Restrictions Weight Bearing Restrictions: Yes LUE Weight Bearing: Non weight bearing RLE Weight Bearing: Weight bearing as tolerated      Mobility  Bed Mobility Overal bed mobility: +2 for physical assistance Bed Mobility: Supine to Sit;Sit to Supine     Supine to sit: +2 for physical assistance;Total assist Sit to supine: +2 for physical assistance;Total assist   General bed mobility comments: use of bed pad to scoot hips around to EOB. Pt groaning in pain with any movement. Assist for trunk to upright and support for LEs off/onto  the bed.   Transfers                 General transfer comment: did not attempt this visit. Pt with much pain sitting at EOB, mild dizziness with pt reported  improvement after sitting for 2-3 min  Ambulation/Gait             General Gait Details: NT at this time  Stairs            Wheelchair Mobility    Modified Rankin (Stroke Patients Only)       Balance Overall balance assessment: Needs assistance;History of Falls Sitting-balance support: Feet supported;Single extremity supported Sitting balance-Leahy Scale: Poor                                       Pertinent Vitals/Pain Pain Assessment: Faces Faces Pain Scale: Hurts whole lot Pain Location: L shoulder and back Pain Descriptors / Indicators: Crying;Grimacing;Guarding Pain Intervention(s): Monitored during session;Premedicated before session;Repositioned;Patient requesting pain meds-RN notified    Home Living Family/patient expects to be discharged to:: Skilled nursing facility               Home Equipment: Gilford Rile - 2 wheels;Cane - quad      Prior Function Level of Independence: Needs assistance   Gait / Transfers Assistance Needed: limited amb recently d/t pain, transfers with assist from ALF staff  ADL's / Homemaking Assistance Needed: assist from [facility] staff with bathing and dressing. she had been getting into wheelchair and going into bathroom to use commode but more recently with increased pain, had to use BSC near bed.        Hand Dominance  Extremity/Trunk Assessment   Upper Extremity Assessment: Defer to OT evaluation       LUE Deficits / Details: pt in sling. able to move digits freely.    Lower Extremity Assessment: Generalized weakness         Communication   Communication: HOH  Cognition Arousal/Alertness: Awake/alert Behavior During Therapy: WFL for tasks assessed/performed Overall Cognitive Status: Within Functional Limits for tasks assessed                      General Comments      Exercises        Assessment/Plan    PT Assessment Patient needs continued PT services  PT Diagnosis  Difficulty walking;Generalized weakness   PT Problem List Decreased strength;Decreased activity tolerance;Decreased mobility;Decreased knowledge of precautions;Pain;Decreased balance  PT Treatment Interventions Gait training;Functional mobility training;Therapeutic activities;Patient/family education;Therapeutic exercise   PT Goals (Current goals can be found in the Care Plan section) Acute Rehab PT Goals Patient Stated Goal: pt agreeable to work with PT/OT. Pt talks about her love of walking around at her ALF. PT Goal Formulation: With patient/family Time For Goal Achievement: 12/18/14 Potential to Achieve Goals: Fair    Frequency Min 3X/week   Barriers to discharge        Co-evaluation PT/OT/SLP Co-Evaluation/Treatment: Yes Reason for Co-Treatment: For patient/therapist safety PT goals addressed during session: Mobility/safety with mobility OT goals addressed during session: ADL's and self-care;Proper use of [employer] equipment and DME       End of Session Equipment Utilized During Treatment: Other (comment) (shoulder immobilizer) Activity Tolerance: Patient limited by pain Patient left: in bed;with call bell/phone within reach           Time: 1122-1146 PT Time Calculation (min) (ACUTE ONLY): 24 min   Charges:   PT Evaluation $Initial PT Evaluation Tier I: 1 Procedure     PT G CodesKenyon King 12-23-2014, 1:58 PM

## 2014-12-04 NOTE — Evaluation (Signed)
Occupational Therapy Evaluation Patient Details Name: [name] MRN: [id number] DOB: 1922/05/13 Today's Date: 12/04/2014    History of Present Illness [name] is a 81 y.o. female with a history of recent left humeral fracture on August 1 after falling, hypertension, hypothyroidism, anemia that presented to the emergency room with complaints of increased back and shoulder pain. Pt found to have R inferior pubic ramus fracture and R 7th rib fracture.    Clinical Impression   Pt limited by pain this session so only sat EOB for several minutes to readjust pt's sling and gown and then pt requesting to lie back down. Will need SNF at d/c. Will follow on acute to progress ADL independence and functional transfers for next venue.    Follow Up Recommendations  SNF;Supervision/Assistance - 24 hour    Equipment Recommendations  Other (comment) (defer to next venue)    Recommendations for Other Services       Precautions / Restrictions Precautions Precautions: Shoulder Shoulder Interventions: Shoulder sling/immobilizer Precaution Comments: recent L humeral fracture. Required Braces or Orthoses: Sling Restrictions Weight Bearing Restrictions: Yes LUE Weight Bearing: Non weight bearing RLE Weight Bearing: Weight bearing as tolerated      Mobility Bed Mobility Overal bed mobility: +2 for physical assistance Bed Mobility: Supine to Sit;Sit to Supine     Supine to sit: +2 for physical assistance;Total assist Sit to supine: +2 for physical assistance;Total assist   General bed mobility comments: use of bed pad to scoot hips around to EOB. Pt groaning in pain with any movement. Assist for trunk to upright and support for LEs off the bed.   Transfers                 General transfer comment: did not attempt this visit. Pt with much pain sitting at EOB.    Balance                                            ADL Overall ADL's : Needs  assistance/impaired Eating/Feeding: Set up;Bed level   Grooming: Wash/dry face;Set up;Bed level   Upper Body Bathing: Total assistance;Sitting   Lower Body Bathing: +2 for physical assistance;Total assistance;Bed level   Upper Body Dressing : Total assistance;Bed level   Lower Body Dressing: Total assistance;Bed level Lower Body Dressing Details (indicate cue type and reason): with socks.               General ADL Comments: OOB not tested this visit. Only sat EOB due to increased pain with any mobility. Adjusted sling at EOB as it was slid to far back on her L Ue and straps not snug enough. Also assisted with repositoning gown.     Vision   no change from baseline.   Perception     Praxis      Pertinent Vitals/Pain Pain Assessment: Faces Faces Pain Scale: Hurts whole lot Pain Location: L shoulder, back Pain Descriptors / Indicators: Grimacing;Guarding Pain Intervention(s): Repositioned;Monitored during session     Hand Dominance     Extremity/Trunk Assessment Upper Extremity Assessment Upper Extremity Assessment: LUE deficits/detail LUE Deficits / Details: pt in sling. able to move digits freely.  LUE: Unable to fully assess due to immobilization           Communication Communication Communication: HOH   Cognition Arousal/Alertness: Awake/alert Behavior During Therapy: WFL for tasks assessed/performed Overall  Cognitive Status: Within Functional Limits for tasks assessed                     General Comments       Exercises       Shoulder Instructions      Home Living Family/patient expects to be discharged to:: Skilled nursing facility                             Home Equipment: Gilford Rile - 2 wheels;Cane - quad          Prior Functioning/Environment Level of Independence: Needs assistance  Gait / Transfers Assistance Needed: limited amb recenetly d/t pain, transfers with assist from ALF staff ADL's / Homemaking Assistance  Needed: assist from [facility] staff with bathing and dressing. she had been getting into wheelchair and going into bathroom to use commode but more recently with increased pain, had to use BSC near bed.        OT Diagnosis: Generalized weakness;Acute pain   OT Problem List: Decreased strength;Decreased knowledge of use of DME or AE;Pain;Decreased activity tolerance   OT Treatment/Interventions: Self-care/ADL training;Patient/family education;Therapeutic activities;DME and/or AE instruction    OT Goals(Current goals can be found in the care plan section) Acute Rehab OT Goals Patient Stated Goal: pt agreeable to work with PT/OT. Pt talks about her love of walking around at her ALF. OT Goal Formulation: With patient Time For Goal Achievement: 12/18/14 Potential to Achieve Goals: Good  OT Frequency: Min 2X/week   Barriers to D/C:            Co-evaluation PT/OT/SLP Co-Evaluation/Treatment: Yes Reason for Co-Treatment: For patient/therapist safety   OT goals addressed during session: ADL's and self-care;Proper use of Adaptive equipment and DME      End of Session    Activity Tolerance: Patient limited by pain Patient left: in bed;with call bell/phone within reach   Time: 1115-1140 OT Time Calculation (min): 25 min Charges:  OT General Charges $OT Visit: 1 Procedure OT Evaluation $Initial OT Evaluation Tier I: 1 Procedure G-Codes:    Jules Schick  [phone] 12/04/2014, 1:08 PM

## 2014-12-05 LAB — CBC
HCT: 26.9 % — ABNORMAL LOW (ref 36.0–46.0)
Hemoglobin: 9.1 g/dL — ABNORMAL LOW (ref 12.0–15.0)
MCH: 33.5 pg (ref 26.0–34.0)
MCHC: 33.8 g/dL (ref 30.0–36.0)
MCV: 98.9 fL (ref 78.0–100.0)
PLATELETS: 561 10*3/uL — AB (ref 150–400)
RBC: 2.72 MIL/uL — ABNORMAL LOW (ref 3.87–5.11)
RDW: 19.1 % — ABNORMAL HIGH (ref 11.5–15.5)
WBC: 9.3 10*3/uL (ref 4.0–10.5)

## 2014-12-05 LAB — BASIC METABOLIC PANEL
Anion gap: 6 (ref 5–15)
BUN: 14 mg/dL (ref 6–20)
CHLORIDE: 102 mmol/L (ref 101–111)
CO2: 26 mmol/L (ref 22–32)
CREATININE: 0.82 mg/dL (ref 0.44–1.00)
Calcium: 8.3 mg/dL — ABNORMAL LOW (ref 8.9–10.3)
GFR calc Af Amer: >60 mL/min (ref >60)
GFR calc non Af Amer: >60 mL/min (ref >60)
Glucose, Bld: 97 mg/dL (ref 65–99)
POTASSIUM: 3.9 mmol/L (ref 3.5–5.1)
Sodium: 134 mmol/L — ABNORMAL LOW (ref 135–145)

## 2014-12-05 NOTE — Clinical Social Work Note (Signed)
Clinical Social Work Assessment  Patient Details  Name: [name] MRN: [id number] Date of Birth: 26-Nov-1922  Date of referral:  12/05/14               Reason for consult:  Facility Placement                Permission sought to share information with:  Facility Sport and exercise psychologist, Family Supports Permission granted to share information::  Yes, Verbal Permission Granted  Name::        Agency::     Relationship::     Contact Information:     Housing/Transportation Living arrangements for the past 2 months:  [place] Passenger transport manager) Source of Information:  Adult Children Patient Interpreter Needed:  None Criminal Activity/Legal Involvement Pertinent to Current Situation/Hospitalization:    Significant Relationships:  Adult Children Lives with:  Facility Resident Do you feel safe going back to the place where you live?    Need for family participation in patient care:  Yes (Comment)  Care giving concerns:  [name] is concerned that pt will not be safe going back to [employer] assessment / plan:  CSW met with pt and her son at bedside. CSW explained SNf process and encouraged pt and her son to discuss her history and current needs.  CSW provided information about SNF facilities and the protocol for obtaining rehab beds.  CSW provided active and supportive listening.  CSW will send pt information to SNF's in [place] area.  Employment status:  Retired Forensic scientist:  Medicare PT Recommendations:  [place] / Referral to community resources:     Patient/Family's Response to care:  Pt discussed much pain and pelvic cracks that are not operable.  Pt stated that she wanted her pain to be under control before being discharged to SNF.  [name] hopeful that pt will gain strength soon and be able to return to ALF  Patient/Family's Understanding of and Emotional Response to Diagnosis, Current Treatment, and Prognosis:  Pt and  family appear to understand diagnoses and are hopeful that pt will mend soon.  Pt appeared to be in pain and grateful for the chance to get pain under control before discharge  Emotional Assessment Appearance:  Appears stated age Attitude/Demeanor/Rapport:  Complaining (pt complained of pain) Affect (typically observed):  Agitated Orientation:  Oriented to Self, Oriented to Place, Oriented to  Time, Oriented to Situation Alcohol / Substance use:    Psych involvement (Current and /or in the community):  No (Comment)  Discharge Needs  Concerns to be addressed:    Readmission within the last 30 days:    Current discharge risk:    Barriers to Discharge:  No Barriers Identified   Carlean Jews, LCSW 12/05/2014, 4:09 PM

## 2014-12-05 NOTE — Discharge Summary (Signed)
Physician Discharge Summary  [name] SEG:[id number] DOB: May 25, 1922 DOA: 12/03/2014  PCP: Delphina Cahill, MD  Admit date: 12/03/2014 Discharge date: 12/06/2014  Time spent: 45 minutes  Recommendations for Outpatient Follow-up:  Patient will be discharged to skilled nursing facility.  Patient will need to continue physical as well as occupational therapy is recommended by the facility.  Patient will need to follow up with primary care provider within one week of discharge, and have repeat CBC and BMP at that time.  Patient should continue medications as prescribed.  Patient should follow a heart healthy diet.   Discharge Diagnoses:  Hypovolemic hyponatremia, chronic Right inferior Pubic ramus fracture/right seventh rib fracture/proximal left humeral neck fracture Leukocytosis/thrombocytosis Essential hypertension GERD Chronic anemia Seasonal allergies Hypothyroidism  Discharge Condition: Stable  Diet recommendation: Heart healthy  Filed Weights   12/03/14 1156  Weight: 65.772 kg (145 lb)    History of present illness:  on 12/03/2014  [name] is a 81 y.o. female with a history of recent left humeral fracture on August 1 after falling, hypertension, hypothyroidism, anemia that presented to the emergency room with complaints of increased back and shoulder pain. Patient was recently admitted to the hospital earlier this month and discharged to her sister living facility. She states that over the last several days she has been unable to ambulate due to pain. She complains of right-sided back pain along with left shoulder pain. Patient denies any other falls or trauma. She denies any chest pain, shortness of breath, abdominal pain, headache, dizziness, nausea, vomiting. Patient states that she has been having to use the bathroom when she drinks a lot of water therefore has had poor oral intake over the last several days. In the emergency department, patient had x-ray showing a new  fracture as well as inferior pubic ramus fracture. Labs were noted to have hyponatremia with a sodium of 123. TR[facility] called for admission.  Hospital Course:  Hypovolemic Hyponatremia, chronic -Improving, Na 133 -Her son, patient has always had a low sodium. States she drinks too much water -Was placed on IVF   Right inferior pubic ramus fracture/Right seventh rib fracture/proximal left humeral fracture -Seen on chest and pelvic x-ray -Was not seen on previous admission -Treat conservatively with pain control and anti-spasmodics, patient still requiring IV pain medication -PT/OT consulted and recommended SNF  Leukocytosis/thrombocytosis -Resolved, Likely acute phase reactants -continue to monitor CBC -UA and chest x-ray negative for infection -Patient denies any recent cough.  Essential hypertension -Continue hydralazine, lisinopril  GERD -Continue PPI  Chronic anemia -Hemoglobin appears to be stable, continue iron supplementation -Continue to monitor CBC -Hemoglobin 9.1 (baseline 9)  Seasonal allergies -Continue loratadine, Flonase  Hypothyroidism -Continue Synthroid  Hemorrhoids  -Continue Anusol   Code Status: DNR  Procedures  None  Consults  None  Discharge Exam: Filed Vitals:   12/06/14 0504  BP: 159/70  Pulse: 82  Temp: 97.8 F (36.6 C)  Resp: 18    Exam  General: Well developed, thin, no distress  HEENT: NCAT, mucous membranes moist  Cardiovascular: S1 S2 auscultated, RRR, 2/6 SEM  Respiratory: Clear to auscultation   Abdomen: Soft, nontender, nondistended, + bowel sounds  Extremities: warm dry without cyanosis clubbing or edema. Extensive ecchymosis on the left hip and thigh, left upper arm and chest wall. Left arm in sling  Neuro: AAOx3, nonfocal  Skin: Without rashes exudates or nodules, multiple bruises  Psych: Appropriate mood and affect, pleasant  Discharge Instructions      Discharge  Instructions    Discharge  instructions    Complete by:  As directed   Patient will be discharged to skilled nursing facility.  Patient will need to continue physical as well as occupational therapy is recommended by the facility.  Patient will need to follow up with primary care provider within one week of discharge, and have repeat CBC and BMP at that time.  Patient should continue medications as prescribed.  Patient should follow a heart healthy diet.            Medication List    TAKE these medications        acetaminophen 325 MG tablet  Commonly known as:  TYLENOL  Take 650 mg by mouth every 6 (six) hours as needed (for pain or fever).     ALIGN 4 MG Caps  Take 1 capsule by mouth daily.     amLODipine 5 MG tablet  Commonly known as:  NORVASC  Take 1 tablet by mouth daily.     aspirin 325 MG tablet  Take 325 mg by mouth daily.     BENEFIBER DRINK MIX PO  Take 10 mLs by mouth daily with breakfast. Mix 2 teaspoonfuls with water and drink daily.     beta carotene w/minerals tablet  Take 1 tablet by mouth 2 (two) times daily.     THERATRUM COMPLETE PO  Take 1 tablet by mouth 2 (two) times daily.     bisacodyl 10 MG/30ML Enem  Commonly known as:  FLEET  Place 10 mg rectally every other day as needed (for bowel movement).     cetirizine 10 MG tablet  Commonly known as:  ZYRTEC  Take 10 mg by mouth at bedtime.     cycloSPORINE 0.05 % ophthalmic emulsion  Commonly known as:  RESTASIS  Place 1 drop into both eyes 2 (two) times daily. *Self Administration*     docusate sodium 100 MG capsule  Commonly known as:  COLACE  Take 100 mg by mouth at bedtime.     fluticasone 50 MCG/ACT nasal spray  Commonly known as:  FLONASE  Place 2 sprays into the nose daily. For allergies     FUSION PLUS PO  Take 1 capsule by mouth daily.     GAVILAX powder  Generic drug:  polyethylene glycol powder  Take 1 Container by mouth daily.     hydrALAZINE 25 MG tablet  Commonly known as:  APRESOLINE  Take 25 mg by  mouth 2 (two) times daily.     HYDROcodone-acetaminophen 5-325 MG per tablet  Commonly known as:  NORCO/VICODIN  Take 1-2 tablets by mouth every 4 (four) hours as needed for severe pain.     hydrocortisone 2.5 % rectal cream  Commonly known as:  ANUSOL-HC  Place rectally 3 (three) times daily.     levothyroxine 112 MCG tablet  Commonly known as:  SYNTHROID, LEVOTHROID  Take 112 mcg by mouth daily before breakfast.     lisinopril 20 MG tablet  Commonly known as:  PRINIVIL,ZESTRIL  Take 20 mg by mouth 2 (two) times daily.     loperamide 2 MG capsule  Commonly known as:  IMODIUM  Take 2 mg by mouth every 4 (four) hours as needed for diarrhea or loose stools.     Magnesium 250 MG Tabs  Take 1 tablet by mouth at bedtime.     Melatonin 1 MG Tabs  Take 1 tablet by mouth at bedtime.     methocarbamol 500 MG tablet  Commonly known as:  ROBAXIN  Take 1 tablet (500 mg total) by mouth every 6 (six) hours as needed for muscle spasms.     omeprazole 20 MG capsule  Commonly known as:  PRILOSEC  Take 20 mg by mouth every morning.     predniSONE 10 MG tablet  Commonly known as:  DELTASONE  Take 10 mg by mouth daily with breakfast.     tiZANidine 2 MG tablet  Commonly known as:  ZANAFLEX  Take 2 mg by mouth every 8 (eight) hours as needed for muscle spasms.       Allergies  Allergen Reactions  . Tramadol Itching and Nausea Only  . Sulfa Antibiotics Nausea And Vomiting   Follow-up Information    Follow up with Delphina Cahill, MD. Schedule an appointment as soon as possible for a visit in 1 week.   Specialty:  Internal Medicine   Why:  Hospital follow-up, 1 week, repeat CBC BMP   Contact information:    [place] [address] [phone]        The results of significant diagnostics from this hospitalization (including imaging, microbiology, ancillary and laboratory) are listed below for reference.    Significant Diagnostic Studies: Dg Chest 1  View  11/21/2014   CLINICAL DATA:  Fall.  EXAM: CHEST  1 VIEW  COMPARISON:  12/27/2012  FINDINGS: Left-sided pacemaker is intact and unchanged. Lungs are adequately inflated without consolidation, effusion or pneumothorax. Cardiomediastinal silhouette is within normal. Known compression fracture over the mid thoracic spine. Acute left humeral neck fracture. Remainder of the exam is unchanged.  IMPRESSION: No acute cardiopulmonary disease.  Known acute left humeral neck fracture.  Stable old mid thoracic spine compression fracture.   Electronically Signed   By: Marin Olp M.D.   On: 11/21/2014 19:38   Dg Chest 2 View  12/03/2014   CLINICAL DATA:  [place] 5 days ago.  Broke left shoulder.  EXAM: CHEST  2 VIEW  COMPARISON:  11/21/2014  FINDINGS: Again noted is fracture of the proximal left humerus. Stable position of the left dual chamber cardiac pacemaker. Heart size is within normal limits. Negative for a pneumothorax. Lungs are clear without airspace disease. Heart size is within normal limits. Again noted is a compression fracture in the mid thoracic spine. No large pleural effusions. There is a mildly displaced fracture of the right seventh rib.  IMPRESSION: Mildly displaced fracture of the right seventh rib.  Known fracture of the proximal left humerus.  No acute lung findings.   Electronically Signed   By: Markus Daft M.D.   On: 12/03/2014 14:12   Dg Lumbar Spine Complete  12/03/2014   CLINICAL DATA:  [place] 5 days ago. Persistent back, pelvic and left shoulder pain.  EXAM: PELVIS - 1-2 VIEW; LEFT SHOULDER - 2+ VIEW; LUMBAR SPINE - COMPLETE 4+ VIEW  COMPARISON:  Pelvis films 11/21/2014. Lumbar spine CT scan 09/08/2012  FINDINGS: Pelvis:  Stable gamma nail and dynamic left hip screw. The right hip is normally located. No acute right hip fracture. Ending inferior pubic ramus fracture is noted on the right side. No definite superior ramus fracture. The pubic symphysis and SI joints are intact. Stable calcified  fibroid.  Lumbar spine:  Advanced degenerative lumbar spondylosis with multilevel disc disease and facet disease. No acute fracture is identified. Mild compression deformities of T11 and T12 appears stable when compared to a prior scalp film from a CT scan. Moderate vascular calcifications without definite aneurysm.  The SI joints are grossly normal.  Left shoulder:  There is a minimally displaced humeral neck fracture with mild medial rotation of the humeral head and slight impaction. The [facility] joint is intact. The visualized left ribs are intact.  IMPRESSION: 1. Mildly displaced left humeral neck fracture. 2. Right inferior pubic ramus fracture. 3. Advanced degenerative changes involving the spine but no acute fracture. Bold   Electronically Signed   By: Marijo Sanes M.D.   On: 12/03/2014 14:12   Dg Pelvis 1-2 Views  12/03/2014   CLINICAL DATA:  [place] 5 days ago. Persistent back, pelvic and left shoulder pain.  EXAM: PELVIS - 1-2 VIEW; LEFT SHOULDER - 2+ VIEW; LUMBAR SPINE - COMPLETE 4+ VIEW  COMPARISON:  Pelvis films 11/21/2014. Lumbar spine CT scan 09/08/2012  FINDINGS: Pelvis:  Stable gamma nail and dynamic left hip screw. The right hip is normally located. No acute right hip fracture. Ending inferior pubic ramus fracture is noted on the right side. No definite superior ramus fracture. The pubic symphysis and SI joints are intact. Stable calcified fibroid.  Lumbar spine:  Advanced degenerative lumbar spondylosis with multilevel disc disease and facet disease. No acute fracture is identified. Mild compression deformities of T11 and T12 appears stable when compared to a prior scalp film from a CT scan. Moderate vascular calcifications without definite aneurysm. The SI joints are grossly normal.  Left shoulder:  There is a minimally displaced humeral neck fracture with mild medial rotation of the humeral head and slight impaction. The [facility] joint is intact. The visualized left ribs are intact.  IMPRESSION: 1. Mildly  displaced left humeral neck fracture. 2. Right inferior pubic ramus fracture. 3. Advanced degenerative changes involving the spine but no acute fracture. Bold   Electronically Signed   By: Marijo Sanes M.D.   On: 12/03/2014 14:12   Dg Pelvis 1-2 Views  11/21/2014   CLINICAL DATA:  Fall.  Pelvic pain.  EXAM: PELVIS - 1-2 VIEW  COMPARISON:  12/29/2012.  FINDINGS: LEFT hip gamma nail is present. The intertrochanteric fracture has healed. Calcified fibroid in the anatomic pelvis. Sacral arcades are obscured by overlying stool and bowel gas. Proximal RIGHT femur appears normal. RIGHT hip joint space appears preserved. The obturator rings appear intact bilaterally. Lower lumbar spondylosis.  IMPRESSION: No acute osseous abnormality. LEFT hip gamma nail with healed intertrochanteric fracture.   Electronically Signed   By: Dereck Ligas M.D.   On: 11/21/2014 19:40   Ct Head Wo Contrast  11/21/2014   CLINICAL DATA:  [place], landing on left side.  EXAM: CT HEAD WITHOUT CONTRAST  TECHNIQUE: Contiguous axial images were obtained from the base of the skull through the vertex without intravenous contrast.  COMPARISON:  None.  FINDINGS: There is no intracranial hemorrhage, mass or evidence of acute infarction. There is no extra-axial fluid collection. Gray matter and white matter appear normal. Cerebral volume is normal for age. Brainstem and posterior fossa are unremarkable. The CSF spaces appear normal.  The bony structures are intact. 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 Remainder of the visible paranasal sinuses are clear.  IMPRESSION: Normal brain.  No acute findings.   Electronically Signed   By: Andreas Newport M.D.   On: 11/21/2014 22:38   Dg Shoulder Left  12/03/2014   CLINICAL DATA:  [place] 5 days ago. Persistent back, pelvic and left shoulder pain.  EXAM: PELVIS - 1-2 VIEW; LEFT SHOULDER - 2+ VIEW; LUMBAR SPINE - COMPLETE 4+ VIEW  COMPARISON:  Pelvis  films 11/21/2014. Lumbar spine CT scan  09/08/2012  FINDINGS: Pelvis:  Stable gamma nail and dynamic left hip screw. The right hip is normally located. No acute right hip fracture. Ending inferior pubic ramus fracture is noted on the right side. No definite superior ramus fracture. The pubic symphysis and SI joints are intact. Stable calcified fibroid.  Lumbar spine:  Advanced degenerative lumbar spondylosis with multilevel disc disease and facet disease. No acute fracture is identified. Mild compression deformities of T11 and T12 appears stable when compared to a prior scalp film from a CT scan. Moderate vascular calcifications without definite aneurysm. The SI joints are grossly normal.  Left shoulder:  There is a minimally displaced humeral neck fracture with mild medial rotation of the humeral head and slight impaction. The [facility] joint is intact. The visualized left ribs are intact.  IMPRESSION: 1. Mildly displaced left humeral neck fracture. 2. Right inferior pubic ramus fracture. 3. Advanced degenerative changes involving the spine but no acute fracture. Bold   Electronically Signed   By: Marijo Sanes M.D.   On: 12/03/2014 14:12   Dg Shoulder Left  11/21/2014   CLINICAL DATA:  Fall injuring left side with left shoulder pain.  EXAM: LEFT SHOULDER - 2+ VIEW  COMPARISON:  Chest x-ray 12/27/2012  FINDINGS: There is a displaced humeral neck fracture no evidence of dislocation. Mild degenerate change of the [facility] joint. Left-sided cardiac pacemaker is present. Calcified plaque over the aortic arch is present.  IMPRESSION: Mildly displaced humeral neck fracture.   Electronically Signed   By: Marin Olp M.D.   On: 11/21/2014 19:33   Dg Humerus Left  11/21/2014   CLINICAL DATA:  Fall.  Proximal LEFT humerus pain.  EXAM: LEFT HUMERUS - 2+ VIEW  COMPARISON:  None.  FINDINGS: Pacemaker power pack is evident in the LEFT upper chest. Moderate AC joint osteoarthritis with undersurface spurring.  Proximal LEFT humerus fracture is present. The fracture is  comminuted and there is mild impaction. Diffuse osteopenia. Maximal radiographic displacement of proximal humerus fragments is 9 mm. This suggests a 1 part fracture.  IMPRESSION: Comminuted proximal LEFT humerus fracture. Radiographically this is a 1 part proximal humerus fracture.   Electronically Signed   By: Dereck Ligas M.D.   On: 11/21/2014 19:34   Dg Femur Min 2 Views Left  11/21/2014   CLINICAL DATA:  Fall.  EXAM: LEFT FEMUR 2 VIEWS  COMPARISON:  12/29/2012 and 12/27/2012  FINDINGS: Exam demonstrates diffuse decreased bone mineralization. There are minimal degenerative changes of the left hip. Left femoral hardware intact and unchanged. No evidence of acute fracture or dislocation. Chronic fragment just medial to the femoral neck.  IMPRESSION: No acute findings.   Electronically Signed   By: Marin Olp M.D.   On: 11/21/2014 19:36    Microbiology: Recent Results (from the past 240 hour(s))  MRSA PCR Screening     Status: None   Collection Time: 12/04/14  5:00 AM  Result Value Ref Range Status   MRSA by PCR NEGATIVE NEGATIVE Final    Comment:        The GeneXpert MRSA Assay (FDA approved for NASAL specimens only), is one component of a comprehensive MRSA colonization surveillance program. It is not intended to diagnose MRSA infection nor to guide or monitor treatment for MRSA infections.      Labs: Basic Metabolic Panel:  Recent Labs Lab 12/03/14 1257 12/03/14 1800 12/04/14 0512 12/05/14 0507 12/06/14 0510  NA 123*  --  129* 134*  133*  K 4.4  --  4.0 3.9 4.2  CL 90*  --  98* 102 102  CO2 25  --  21* 26 25  GLUCOSE 138*  --  84 97 91  BUN 15  --  14 14 12   CREATININE 0.98 0.89 0.95 0.82 0.80  CALCIUM 8.7*  --  8.8* 8.3* 8.4*   Liver Function Tests:  Recent Labs Lab 12/03/14 1257  AST 22  ALT 17  ALKPHOS 103  BILITOT 0.8  PROT 6.4*  ALBUMIN 3.6   No results for input(s): LIPASE, AMYLASE in the last 168 hours. No results for input(s): AMMONIA in the  last 168 hours. CBC:  Recent Labs Lab 12/03/14 1257 12/03/14 1800 12/04/14 0512 12/05/14 0507  WBC 11.2* 10.0 8.0 9.3  NEUTROABS 9.9*  --   --   --   HGB 9.4* 10.0* 9.5* 9.1*  HCT 27.1* 29.1* 29.2* 26.9*  MCV 94.8 95.7 97.7 98.9  PLT 583* 599* 558* 561*   Cardiac Enzymes:  Recent Labs Lab 12/03/14 1257  TROPONINI <0.03   BNP: BNP (last 3 results) No results for input(s): BNP in the last 8760 hours.  ProBNP (last 3 results) No results for input(s): PROBNP in the last 8760 hours.  CBG: No results for input(s): GLUCAP in the last 168 hours.     SignedALLICE, GARRO  [facility] 12/06/2014, 9:39 AM

## 2014-12-05 NOTE — Progress Notes (Signed)
[facility]                                                                              Patient Demographics  [name], is a 81 y.o. female, DOB - May 17, 1922, [id number]  Admit date - 12/03/2014   Admitting Physician Cristal Ford, DO  Outpatient Primary MD for the patient is Delphina Cahill, MD  LOS - 2   Chief Complaint  Patient presents with  . Back Pain  . Shoulder Pain      HPI on 12/03/2014  [name] is a 81 y.o. female with a history of recent left humeral fracture on August 1 after falling, hypertension, hypothyroidism, anemia that presented to the emergency room with complaints of increased back and shoulder pain. Patient was recently admitted to the hospital earlier this month and discharged to her sister living facility. She states that over the last several days she has been unable to ambulate due to pain. She complains of right-sided back pain along with left shoulder pain. Patient denies any other falls or trauma. She denies any chest pain, shortness of breath, abdominal pain, headache, dizziness, nausea, vomiting. Patient states that she has been having to use the bathroom when she drinks a lot of water therefore has had poor oral intake over the last several days. In the emergency department, patient had x-ray showing a new fracture as well as inferior pubic ramus fracture. Labs were noted to have hyponatremia with a sodium of 123. TRH called for admission.  Assessment & Plan   Hypovolemic Hyponatremia, chronic -Improving, Na 134 -Her son, patient has always had a low sodium. States she drinks too much water -Continue IVF and monitor BMP  Right inferior pubic ramus fracture/Right seventh rib fracture/proximal left humeral fracture -Seen on chest and pelvic x-ray -Was not seen on previous admission -Treat conservatively with pain control and anti-spasmodics, patient still requiring IV pain medication -PT/OT consulted and recommended  SNF  Leukocytosis/thrombocytosis -Resolved, Likely acute phase reactants -continue to monitor CBC -UA and chest x-ray negative for infection -Patient denies any recent cough.  Essential hypertension -Continue hydralazine, lisinopril  GERD -Continue PPI  Chronic anemia -Hemoglobin appears to be stable, continue iron supplementation -Continue to monitor CBC -Hemoglobin 9.1 (baseline 9)  Seasonal allergies -Continue loratadine, Flonase  Hypothyroidism -Continue Synthroid  Code Status: DNR  Family Communication: Son at bedside  Disposition Plan: Admitted. Pending SNF 8/15  Time Spent in minutes   30 minutes  Procedures  None  Consults   None  DVT Prophylaxis  Lovenox  Lab Results  Component Value Date   PLT 561* 12/05/2014    Medications  Scheduled Meds: . ALIGN  1 capsule Oral Daily  . amLODipine  5 mg Oral Daily  . aspirin  325 mg Oral Daily  . cycloSPORINE  1 drop Both Eyes BID  . docusate sodium  100 mg Oral QHS  . enoxaparin (LOVENOX) injection  40 mg Subcutaneous Q24H  . fluticasone  2 spray Each Nare Daily  . folic acid-pyridoxine-cyancobalamin  1 tablet Oral Daily  . hydrALAZINE  25 mg Oral BID  . hydrocortisone   Rectal TID  . levothyroxine  112 mcg Oral QAC  breakfast  . lisinopril  20 mg Oral BID  . loratadine  10 mg Oral Daily  . magnesium oxide  400 mg Oral QHS  . multivitamin  1 tablet Oral BID  . pantoprazole  40 mg Oral Daily  . polyethylene glycol  17 g Oral Daily  . predniSONE  10 mg Oral Q breakfast   Continuous Infusions: . sodium chloride 75 mL/hr at 12/05/14 0934   PRN Meds:.acetaminophen, HYDROcodone-acetaminophen, loperamide, methocarbamol, morphine injection, ondansetron **OR** ondansetron (ZOFRAN) IV, sodium phosphate, tiZANidine  Antibiotics    Anti-infectives    None      Subjective:   [name] seen and examined today.  Patient continues to be in pain.  Denies  dizziness, chest pain, shortness of breath,  abdominal pain, N/V/D/C.   Objective:   Filed Vitals:   12/04/14 0514 12/04/14 1500 12/04/14 2130 12/05/14 0509  BP: 150/70 139/59 143/65 143/57  Pulse: 87 88 98 87  Temp: 98.3 F (36.8 C) 98.6 F (37 C) 98.5 F (36.9 C) 97.8 F (36.6 C)  TempSrc: Oral Oral Oral Oral  Resp: 18 18 18 17   Height:      Weight:      SpO2: 97% 94% 98% 100%    Wt Readings from Last 3 Encounters:  12/03/14 65.772 kg (145 lb)  11/21/14 67.8 kg (149 lb 7.6 oz)  09/23/14 65.318 kg (144 lb)     Intake/Output Summary (Last 24 hours) at 12/05/14 1143 Last data filed at 12/05/14 0700  Gross per 24 hour  Intake    720 ml  Output   2250 ml  Net  -1530 ml    Exam  General: Well developed, thin, no distress  HEENT: NCAT, mucous membranes moist  Cardiovascular: S1 S2 auscultated, RRR, 2/6 SEM  Respiratory: Clear to auscultation   Abdomen: Soft, nontender, nondistended, + bowel sounds  Extremities: warm dry without cyanosis clubbing or edema. Extensive ecchymosis on the left hip and thigh, left upper arm and chest wall. Left arm in sling  Neuro: AAOx3, nonfocal  Skin: Without rashes exudates or nodules, multiple bruises  Psych: Appropriate mood and affect, pleasant  Data Review   Micro Results Recent Results (from the past 240 hour(s))  MRSA PCR Screening     Status: None   Collection Time: 12/04/14  5:00 AM  Result Value Ref Range Status   MRSA by PCR NEGATIVE NEGATIVE Final    Comment:        The GeneXpert MRSA Assay (FDA approved for NASAL specimens only), is one component of a comprehensive MRSA colonization surveillance program. It is not intended to diagnose MRSA infection nor to guide or monitor treatment for MRSA infections.     Radiology Reports Dg Chest 1 View  11/21/2014   CLINICAL DATA:  Fall.  EXAM: CHEST  1 VIEW  COMPARISON:  12/27/2012  FINDINGS: Left-sided pacemaker is intact and unchanged. Lungs are adequately inflated without consolidation, effusion or  pneumothorax. Cardiomediastinal silhouette is within normal. Known compression fracture over the mid thoracic spine. Acute left humeral neck fracture. Remainder of the exam is unchanged.  IMPRESSION: No acute cardiopulmonary disease.  Known acute left humeral neck fracture.  Stable old mid thoracic spine compression fracture.   Electronically Signed   By: Marin Olp M.D.   On: 11/21/2014 19:38   Dg Chest 2 View  12/03/2014   CLINICAL DATA:  [place] 5 days ago.  Broke left shoulder.  EXAM: CHEST  2 VIEW  COMPARISON:  11/21/2014  FINDINGS:  Again noted is fracture of the proximal left humerus. Stable position of the left dual chamber cardiac pacemaker. Heart size is within normal limits. Negative for a pneumothorax. Lungs are clear without airspace disease. Heart size is within normal limits. Again noted is a compression fracture in the mid thoracic spine. No large pleural effusions. There is a mildly displaced fracture of the right seventh rib.  IMPRESSION: Mildly displaced fracture of the right seventh rib.  Known fracture of the proximal left humerus.  No acute lung findings.   Electronically Signed   By: Markus Daft M.D.   On: 12/03/2014 14:12   Dg Lumbar Spine Complete  12/03/2014   CLINICAL DATA:  [place] 5 days ago. Persistent back, pelvic and left shoulder pain.  EXAM: PELVIS - 1-2 VIEW; LEFT SHOULDER - 2+ VIEW; LUMBAR SPINE - COMPLETE 4+ VIEW  COMPARISON:  Pelvis films 11/21/2014. Lumbar spine CT scan 09/08/2012  FINDINGS: Pelvis:  Stable gamma nail and dynamic left hip screw. The right hip is normally located. No acute right hip fracture. Ending inferior pubic ramus fracture is noted on the right side. No definite superior ramus fracture. The pubic symphysis and SI joints are intact. Stable calcified fibroid.  Lumbar spine:  Advanced degenerative lumbar spondylosis with multilevel disc disease and facet disease. No acute fracture is identified. Mild compression deformities of T11 and T12 appears stable when  compared to a prior scalp film from a CT scan. Moderate vascular calcifications without definite aneurysm. The SI joints are grossly normal.  Left shoulder:  There is a minimally displaced humeral neck fracture with mild medial rotation of the humeral head and slight impaction. The [facility] joint is intact. The visualized left ribs are intact.  IMPRESSION: 1. Mildly displaced left humeral neck fracture. 2. Right inferior pubic ramus fracture. 3. Advanced degenerative changes involving the spine but no acute fracture. Bold   Electronically Signed   By: Marijo Sanes M.D.   On: 12/03/2014 14:12   Dg Pelvis 1-2 Views  12/03/2014   CLINICAL DATA:  [place] 5 days ago. Persistent back, pelvic and left shoulder pain.  EXAM: PELVIS - 1-2 VIEW; LEFT SHOULDER - 2+ VIEW; LUMBAR SPINE - COMPLETE 4+ VIEW  COMPARISON:  Pelvis films 11/21/2014. Lumbar spine CT scan 09/08/2012  FINDINGS: Pelvis:  Stable gamma nail and dynamic left hip screw. The right hip is normally located. No acute right hip fracture. Ending inferior pubic ramus fracture is noted on the right side. No definite superior ramus fracture. The pubic symphysis and SI joints are intact. Stable calcified fibroid.  Lumbar spine:  Advanced degenerative lumbar spondylosis with multilevel disc disease and facet disease. No acute fracture is identified. Mild compression deformities of T11 and T12 appears stable when compared to a prior scalp film from a CT scan. Moderate vascular calcifications without definite aneurysm. The SI joints are grossly normal.  Left shoulder:  There is a minimally displaced humeral neck fracture with mild medial rotation of the humeral head and slight impaction. The [facility] joint is intact. The visualized left ribs are intact.  IMPRESSION: 1. Mildly displaced left humeral neck fracture. 2. Right inferior pubic ramus fracture. 3. Advanced degenerative changes involving the spine but no acute fracture. Bold   Electronically Signed   By: Marijo Sanes M.D.    On: 12/03/2014 14:12   Dg Pelvis 1-2 Views  11/21/2014   CLINICAL DATA:  Fall.  Pelvic pain.  EXAM: PELVIS - 1-2 VIEW  COMPARISON:  12/29/2012.  FINDINGS: LEFT hip  gamma nail is present. The intertrochanteric fracture has healed. Calcified fibroid in the anatomic pelvis. Sacral arcades are obscured by overlying stool and bowel gas. Proximal RIGHT femur appears normal. RIGHT hip joint space appears preserved. The obturator rings appear intact bilaterally. Lower lumbar spondylosis.  IMPRESSION: No acute osseous abnormality. LEFT hip gamma nail with healed intertrochanteric fracture.   Electronically Signed   By: Dereck Ligas M.D.   On: 11/21/2014 19:40   Ct Head Wo Contrast  11/21/2014   CLINICAL DATA:  [place], landing on left side.  EXAM: CT HEAD WITHOUT CONTRAST  TECHNIQUE: Contiguous axial images were obtained from the base of the skull through the vertex without intravenous contrast.  COMPARISON:  None.  FINDINGS: There is no intracranial hemorrhage, mass or evidence of acute infarction. There is no extra-axial fluid collection. Gray matter and white matter appear normal. Cerebral volume is normal for age. Brainstem and posterior fossa are unremarkable. The CSF spaces appear normal.  The bony structures are intact. 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 Remainder of the visible paranasal sinuses are clear.  IMPRESSION: Normal brain.  No acute findings.   Electronically Signed   By: Andreas Newport M.D.   On: 11/21/2014 22:38   Dg Shoulder Left  12/03/2014   CLINICAL DATA:  [place] 5 days ago. Persistent back, pelvic and left shoulder pain.  EXAM: PELVIS - 1-2 VIEW; LEFT SHOULDER - 2+ VIEW; LUMBAR SPINE - COMPLETE 4+ VIEW  COMPARISON:  Pelvis films 11/21/2014. Lumbar spine CT scan 09/08/2012  FINDINGS: Pelvis:  Stable gamma nail and dynamic left hip screw. The right hip is normally located. No acute right hip fracture. Ending inferior pubic ramus fracture is noted on the right side. No definite  superior ramus fracture. The pubic symphysis and SI joints are intact. Stable calcified fibroid.  Lumbar spine:  Advanced degenerative lumbar spondylosis with multilevel disc disease and facet disease. No acute fracture is identified. Mild compression deformities of T11 and T12 appears stable when compared to a prior scalp film from a CT scan. Moderate vascular calcifications without definite aneurysm. The SI joints are grossly normal.  Left shoulder:  There is a minimally displaced humeral neck fracture with mild medial rotation of the humeral head and slight impaction. The [facility] joint is intact. The visualized left ribs are intact.  IMPRESSION: 1. Mildly displaced left humeral neck fracture. 2. Right inferior pubic ramus fracture. 3. Advanced degenerative changes involving the spine but no acute fracture. Bold   Electronically Signed   By: Marijo Sanes M.D.   On: 12/03/2014 14:12   Dg Shoulder Left  11/21/2014   CLINICAL DATA:  Fall injuring left side with left shoulder pain.  EXAM: LEFT SHOULDER - 2+ VIEW  COMPARISON:  Chest x-ray 12/27/2012  FINDINGS: There is a displaced humeral neck fracture no evidence of dislocation. Mild degenerate change of the [facility] joint. Left-sided cardiac pacemaker is present. Calcified plaque over the aortic arch is present.  IMPRESSION: Mildly displaced humeral neck fracture.   Electronically Signed   By: Marin Olp M.D.   On: 11/21/2014 19:33   Dg Humerus Left  11/21/2014   CLINICAL DATA:  Fall.  Proximal LEFT humerus pain.  EXAM: LEFT HUMERUS - 2+ VIEW  COMPARISON:  None.  FINDINGS: Pacemaker power pack is evident in the LEFT upper chest. Moderate AC joint osteoarthritis with undersurface spurring.  Proximal LEFT humerus fracture is present. The fracture is comminuted and there is mild impaction. Diffuse osteopenia. Maximal radiographic displacement of  proximal humerus fragments is 9 mm. This suggests a 1 part fracture.  IMPRESSION: Comminuted proximal LEFT humerus fracture.  Radiographically this is a 1 part proximal humerus fracture.   Electronically Signed   By: Dereck Ligas M.D.   On: 11/21/2014 19:34   Dg Femur Min 2 Views Left  11/21/2014   CLINICAL DATA:  Fall.  EXAM: LEFT FEMUR 2 VIEWS  COMPARISON:  12/29/2012 and 12/27/2012  FINDINGS: Exam demonstrates diffuse decreased bone mineralization. There are minimal degenerative changes of the left hip. Left femoral hardware intact and unchanged. No evidence of acute fracture or dislocation. Chronic fragment just medial to the femoral neck.  IMPRESSION: No acute findings.   Electronically Signed   By: Marin Olp M.D.   On: 11/21/2014 19:36    CBC  Recent Labs Lab 12/03/14 1257 12/03/14 1800 12/04/14 0512 12/05/14 0507  WBC 11.2* 10.0 8.0 9.3  HGB 9.4* 10.0* 9.5* 9.1*  HCT 27.1* 29.1* 29.2* 26.9*  PLT 583* 599* 558* 561*  MCV 94.8 95.7 97.7 98.9  MCH 32.9 32.9 31.8 33.5  MCHC 34.7 34.4 32.5 33.8  RDW 18.2* 18.2* 18.7* 19.1*  LYMPHSABS 0.6*  --   --   --   MONOABS 0.7  --   --   --   EOSABS 0.0  --   --   --   BASOSABS 0.0  --   --   --     Chemistries   Recent Labs Lab 12/03/14 1257 12/03/14 1800 12/04/14 0512 12/05/14 0507  NA 123*  --  129* 134*  K 4.4  --  4.0 3.9  CL 90*  --  98* 102  CO2 25  --  21* 26  GLUCOSE 138*  --  84 97  BUN 15  --  14 14  CREATININE 0.98 0.89 0.95 0.82  CALCIUM 8.7*  --  8.8* 8.3*  AST 22  --   --   --   ALT 17  --   --   --   ALKPHOS 103  --   --   --   BILITOT 0.8  --   --   --    ------------------------------------------------------------------------------------------------------------------ estimated creatinine clearance is 41.9 mL/min (by C-G formula based on Cr of 0.82). ------------------------------------------------------------------------------------------------------------------ No results for input(s): HGBA1C in the last 72  hours. ------------------------------------------------------------------------------------------------------------------ No results for input(s): CHOL, HDL, LDLCALC, TRIG, CHOLHDL, LDLDIRECT in the last 72 hours. ------------------------------------------------------------------------------------------------------------------ No results for input(s): TSH, T4TOTAL, T3FREE, THYROIDAB in the last 72 hours.  Invalid input(s): FREET3 ------------------------------------------------------------------------------------------------------------------ No results for input(s): VITAMINB12, FOLATE, FERRITIN, TIBC, IRON, RETICCTPCT in the last 72 hours.  Coagulation profile No results for input(s): INR, PROTIME in the last 168 hours.  No results for input(s): DDIMER in the last 72 hours.  Cardiac Enzymes  Recent Labs Lab 12/03/14 1257  TROPONINI <0.03   ------------------------------------------------------------------------------------------------------------------ Invalid input(s): POCBNP    Karrigan Messamore D.O. on 12/05/2014 at 11:43 AM  Between 7am to 7pm - Pager - [phone]  After 7pm go to [email or web address] - password [email or web address]  And look for the night coverage person covering for me after hours  [facility] Office  [phone]

## 2014-12-05 NOTE — Discharge Instructions (Signed)
Hyponatremia  Hyponatremia is when the salt (sodium) in your blood is low. When salt becomes low, your cells take in extra water and puff up (swell). The puffiness can happen in the whole body. It mostly affects the brain and is very serious.  HOME CARE  Only take medicine as told by your doctor.  Follow any diet instructions you were given. This includes limiting how much fluid you drink.  Keep all doctor visits for tests as told.  Avoid alcohol and drugs. GET HELP RIGHT AWAY IF:  You start to twitch and shake (seize).  You pass out (faint).  You continue to have watery poop (diarrhea) or you throw up (vomit).  You feel sick to your stomach (nauseous).  You are tired (fatigued), have a headache, are confused, or feel weak.  Your problems that first brought you to the doctor come back.  You have trouble following your diet instructions. MAKE SURE YOU:   Understand these instructions.  Will watch your condition.  Will get help right away if you are not doing well or get worse. Document Released: 12/20/2010 Document Revised: 07/02/2011 Document Reviewed: 12/20/2010 [facility] Patient Information 2015 [place], [place]. This information is not intended to replace advice given to you by your health care provider. Make sure you discuss any questions you have with your health care provider.

## 2014-12-06 DIAGNOSIS — K648 Other hemorrhoids: Secondary | ICD-10-CM | POA: Diagnosis not present

## 2014-12-06 DIAGNOSIS — I10 Essential (primary) hypertension: Secondary | ICD-10-CM | POA: Diagnosis not present

## 2014-12-06 DIAGNOSIS — S32511A Fracture of superior rim of right pubis, initial encounter for closed fracture: Secondary | ICD-10-CM | POA: Diagnosis not present

## 2014-12-06 DIAGNOSIS — S42201D Unspecified fracture of upper end of right humerus, subsequent encounter for fracture with routine healing: Secondary | ICD-10-CM | POA: Diagnosis not present

## 2014-12-06 DIAGNOSIS — D62 Acute posthemorrhagic anemia: Secondary | ICD-10-CM | POA: Diagnosis not present

## 2014-12-06 DIAGNOSIS — K5901 Slow transit constipation: Secondary | ICD-10-CM | POA: Diagnosis not present

## 2014-12-06 DIAGNOSIS — E039 Hypothyroidism, unspecified: Secondary | ICD-10-CM | POA: Diagnosis not present

## 2014-12-06 DIAGNOSIS — R2681 Unsteadiness on feet: Secondary | ICD-10-CM | POA: Diagnosis not present

## 2014-12-06 DIAGNOSIS — D473 Essential (hemorrhagic) thrombocythemia: Secondary | ICD-10-CM | POA: Diagnosis not present

## 2014-12-06 DIAGNOSIS — S2231XS Fracture of one rib, right side, sequela: Secondary | ICD-10-CM | POA: Diagnosis not present

## 2014-12-06 DIAGNOSIS — R278 Other lack of coordination: Secondary | ICD-10-CM | POA: Diagnosis not present

## 2014-12-06 DIAGNOSIS — M6281 Muscle weakness (generalized): Secondary | ICD-10-CM | POA: Diagnosis not present

## 2014-12-06 DIAGNOSIS — S2231XD Fracture of one rib, right side, subsequent encounter for fracture with routine healing: Secondary | ICD-10-CM | POA: Diagnosis not present

## 2014-12-06 DIAGNOSIS — D649 Anemia, unspecified: Secondary | ICD-10-CM | POA: Diagnosis not present

## 2014-12-06 DIAGNOSIS — K219 Gastro-esophageal reflux disease without esophagitis: Secondary | ICD-10-CM | POA: Diagnosis not present

## 2014-12-06 DIAGNOSIS — Z9181 History of falling: Secondary | ICD-10-CM | POA: Diagnosis not present

## 2014-12-06 DIAGNOSIS — S32502G Unspecified fracture of left pubis, subsequent encounter for fracture with delayed healing: Secondary | ICD-10-CM | POA: Diagnosis not present

## 2014-12-06 DIAGNOSIS — E860 Dehydration: Secondary | ICD-10-CM | POA: Diagnosis not present

## 2014-12-06 DIAGNOSIS — N179 Acute kidney failure, unspecified: Secondary | ICD-10-CM | POA: Diagnosis not present

## 2014-12-06 DIAGNOSIS — E871 Hypo-osmolality and hyponatremia: Secondary | ICD-10-CM | POA: Diagnosis not present

## 2014-12-06 DIAGNOSIS — M353 Polymyalgia rheumatica: Secondary | ICD-10-CM | POA: Diagnosis not present

## 2014-12-06 DIAGNOSIS — S42295D Other nondisplaced fracture of upper end of left humerus, subsequent encounter for fracture with routine healing: Secondary | ICD-10-CM | POA: Diagnosis not present

## 2014-12-06 DIAGNOSIS — S42212A Unspecified displaced fracture of surgical neck of left humerus, initial encounter for closed fracture: Secondary | ICD-10-CM | POA: Diagnosis not present

## 2014-12-06 DIAGNOSIS — S42302S Unspecified fracture of shaft of humerus, left arm, sequela: Secondary | ICD-10-CM | POA: Diagnosis not present

## 2014-12-06 DIAGNOSIS — S32591D Other specified fracture of right pubis, subsequent encounter for fracture with routine healing: Secondary | ICD-10-CM | POA: Diagnosis not present

## 2014-12-06 DIAGNOSIS — S2239XA Fracture of one rib, unspecified side, initial encounter for closed fracture: Secondary | ICD-10-CM | POA: Diagnosis not present

## 2014-12-06 LAB — BASIC METABOLIC PANEL
Anion gap: 6 (ref 5–15)
BUN: 12 mg/dL (ref 6–20)
CALCIUM: 8.4 mg/dL — AB (ref 8.9–10.3)
CO2: 25 mmol/L (ref 22–32)
Chloride: 102 mmol/L (ref 101–111)
Creatinine, Ser: 0.8 mg/dL (ref 0.44–1.00)
GFR calc Af Amer: >60 mL/min (ref >60)
Glucose, Bld: 91 mg/dL (ref 65–99)
Potassium: 4.2 mmol/L (ref 3.5–5.1)
Sodium: 133 mmol/L — ABNORMAL LOW (ref 135–145)

## 2014-12-06 LAB — CUP PACEART REMOTE DEVICE CHECK
Battery Remaining Longevity: 106 mo
Brady Statistic RA Percent Paced: 34 %
Brady Statistic RV Percent Paced: 1 %
Date Time Interrogation Session: 20160809074831
Lead Channel Impedance Value: 400 Ohm
Lead Channel Pacing Threshold Pulse Width: 0.5 ms
Lead Channel Sensing Intrinsic Amplitude: 12 mV
Lead Channel Setting Sensing Sensitivity: 2 mV
MDC IDC MSMT BATTERY REMAINING PERCENTAGE: 91 %
MDC IDC MSMT BATTERY VOLTAGE: 2.95 V
MDC IDC MSMT LEADCHNL RA PACING THRESHOLD AMPLITUDE: 0.75 V
MDC IDC MSMT LEADCHNL RA SENSING INTR AMPL: 4.6 mV
MDC IDC MSMT LEADCHNL RV IMPEDANCE VALUE: 400 Ohm
MDC IDC MSMT LEADCHNL RV PACING THRESHOLD AMPLITUDE: 1 V
MDC IDC MSMT LEADCHNL RV PACING THRESHOLD PULSEWIDTH: 0.5 ms
MDC IDC SET LEADCHNL RA PACING AMPLITUDE: 2 V
MDC IDC SET LEADCHNL RV PACING AMPLITUDE: 2.5 V
MDC IDC SET LEADCHNL RV PACING PULSEWIDTH: 0.5 ms
MDC IDC STAT BRADY AP VP PERCENT: 1 %
MDC IDC STAT BRADY AP VS PERCENT: 34 %
MDC IDC STAT BRADY AS VP PERCENT: 1 %
MDC IDC STAT BRADY AS VS PERCENT: 65 %
Pulse Gen Model: 2210
Pulse Gen Serial Number: 7316836

## 2014-12-06 MED ORDER — HYDROCORTISONE 2.5 % RE CREA: TOPICAL_CREAM | Freq: Three times a day (TID) | RECTAL | Status: DC

## 2014-12-06 MED ORDER — HYDROCODONE-ACETAMINOPHEN 5-325 MG PO TABS: 1.0000 | ORAL_TABLET | ORAL | Status: DC | PRN

## 2014-12-06 NOTE — Care Management Important Message (Signed)
Important Message  Patient Details  Name: [name] MRN: [id number] Date of Birth: 1922-08-26   Medicare Important Message Given:  Yes-second notification given    Shelda Altes 12/06/2014, 4:22 [place] Message  Patient Details  Name: [name] MRN: [id number] Date of Birth: [birth year]   Medicare Important Message Given:  Yes-second notification given    Shelda Altes 12/06/2014, 4:22 PM

## 2014-12-06 NOTE — Progress Notes (Signed)
Clinical Social Work  CSW faxed DC summary to [facility] who is agreeable to accept patient today. CSW prepared DC packet with FL2, DC summary, DNR, and hard scripts included. CSW informed patient and family of DC plans and patient is happy to be going to [facility]. Patient and family prefer [facility] for transportation and is aware of no guarantee of payment. [facility] arranged for 12:15pm. [facility] request #: [id number].  CSW is signing off but available if needed.  [place], [place] [phone]

## 2014-12-06 NOTE — Progress Notes (Signed)
Gave report to [place] at [facility]. Left number if she had additional questions.

## 2014-12-06 NOTE — Clinical Social Work Placement (Signed)
   CLINICAL SOCIAL WORK PLACEMENT  NOTE  Date:  12/06/2014  Patient Details  Name: [name] MRN: [id number] Date of Birth: 1922-05-05  Clinical Social Work is seeking post-discharge placement for this patient at the [place] level of care (*CSW will initial, date and re-position this form in  chart as items are completed):  Yes   Patient/family provided with [place] Work Department's list of facilities offering this level of care within the geographic area requested by the patient (or if unable, by the patient's family).  Yes   Patient/family informed of their freedom to choose among providers that offer the needed level of care, that participate in Medicare, Medicaid or managed care program needed by the patient, have an available bed and are willing to accept the patient.  Yes   Patient/family informed of [place]'s ownership interest in [facility] and [facility], as well as of the fact that they are under no obligation to receive care at these facilities.  PASRR submitted to EDS on 12/04/14     PASRR number received on       Existing PASRR number confirmed on 12/04/14     FL2 transmitted to all facilities in geographic area requested by pt/family on 12/04/14     FL2 transmitted to all facilities within larger geographic area on       Patient informed that his/her managed care company has contracts with or will negotiate with certain facilities, including the following:        Yes   Patient/family informed of bed offers received.  Patient chooses bed at [facility])     Physician recommends and patient chooses bed at      Patient to be transferred to [facility]) on 12/06/14.  Patient to be transferred to facility by [facility] (ambulance)     Patient family notified on 12/06/14 of transfer.  Name of family member notified:  Son at bedside     PHYSICIAN       Additional Comment:     _______________________________________________ Boone Master, [place] 12/06/2014, 11:48 AM

## 2014-12-07 ENCOUNTER — Telehealth: Payer: Self-pay | Admitting: Internal Medicine

## 2014-12-07 NOTE — Telephone Encounter (Signed)
LMOVM for pt husband to return call.

## 2014-12-07 NOTE — Telephone Encounter (Signed)
New message     Pt is going to rehab for 6-8 weeks Pt husband has question regarding setting up device at the rehab facility Please call to discuss

## 2014-12-07 NOTE — Telephone Encounter (Signed)
Pt son aware that he can hook monitor up at pt room at the nursing home.

## 2014-12-08 ENCOUNTER — Non-Acute Institutional Stay (SKILLED_NURSING_FACILITY): Payer: Medicare Other | Admitting: Internal Medicine

## 2014-12-08 DIAGNOSIS — K219 Gastro-esophageal reflux disease without esophagitis: Secondary | ICD-10-CM

## 2014-12-08 DIAGNOSIS — K648 Other hemorrhoids: Secondary | ICD-10-CM

## 2014-12-08 DIAGNOSIS — E871 Hypo-osmolality and hyponatremia: Secondary | ICD-10-CM

## 2014-12-08 DIAGNOSIS — I10 Essential (primary) hypertension: Secondary | ICD-10-CM

## 2014-12-08 DIAGNOSIS — K5901 Slow transit constipation: Secondary | ICD-10-CM | POA: Diagnosis not present

## 2014-12-08 DIAGNOSIS — S32502G Unspecified fracture of left pubis, subsequent encounter for fracture with delayed healing: Secondary | ICD-10-CM

## 2014-12-08 DIAGNOSIS — R2681 Unsteadiness on feet: Secondary | ICD-10-CM | POA: Diagnosis not present

## 2014-12-08 DIAGNOSIS — D62 Acute posthemorrhagic anemia: Secondary | ICD-10-CM

## 2014-12-08 DIAGNOSIS — S42302S Unspecified fracture of shaft of humerus, left arm, sequela: Secondary | ICD-10-CM

## 2014-12-08 DIAGNOSIS — S2231XS Fracture of one rib, right side, sequela: Secondary | ICD-10-CM

## 2014-12-08 DIAGNOSIS — M353 Polymyalgia rheumatica: Secondary | ICD-10-CM

## 2014-12-08 DIAGNOSIS — S32592G Other specified fracture of left pubis, subsequent encounter for fracture with delayed healing: Secondary | ICD-10-CM

## 2014-12-08 DIAGNOSIS — E039 Hypothyroidism, unspecified: Secondary | ICD-10-CM

## 2014-12-08 NOTE — Progress Notes (Signed)
Patient ID: [name], female   DOB: October 08, 1922, 81 y.o.   MRN: [id number]     Facility: [facility]    PCP: Delphina Cahill, MD  Code Status: DNR  Allergies  Allergen Reactions  . Tramadol Itching and Nausea Only  . Sulfa Antibiotics Nausea And Vomiting    Chief Complaint  Patient presents with  . New Admit To SNF     HPI:  81 y.o. patient is here for short term rehabilitation post hospital admission from 12/03/14-12/06/14 with increased back and shoulder pain. Xray showed new right inferior pubic rami fracture and right 7th rib fracture along with her old proximal left humerus fracture. She was also noted to have hyponatremia. She received iv fluids. Orthopedics was consulted, conservative management with pain medication was decided upon. She is seen in her room today. She has pmh of HTN, hypothyroidism, anemia among others. Her pain is under control with current regimen. Movement worsens her pain. She has been constipated. She has some dizziness with change of position. No other concerns  Review of Systems:  Constitutional: Negative for fever, chills, diaphoresis.  HENT: Negative for headache, congestion, nasal discharge  Eyes: Negative for eye pain, blurred vision, double vision and discharge.  Respiratory: Negative for cough, shortness of breath and wheezing.   Cardiovascular: Negative for chest pain, palpitations, leg swelling.  Gastrointestinal: Negative for heartburn, nausea, vomiting, abdominal pain Genitourinary: Negative for dysuria Musculoskeletal: Negative for back pain, falls Skin: Negative for itching, rash.  Neurological: Negative for dizziness, tingling, focal weakness Psychiatric/Behavioral: Negative for depression   Past Medical History  Diagnosis Date  . Hypertension   . Hypothyroidism   . Bradycardia   . Anemia   . Diverticulosis   . External hemorrhoids   . Constipation   . Macular degeneration   . Seasonal allergies   .  Osteoporosis   . Degenerative joint disease   . Palpitations   . Hyperlipidemia   . GERD (gastroesophageal reflux disease)   . Pacemaker   . Thyrotoxicosis without mention of goiter or other cause, without mention of thyrotoxic crisis or storm   . Goiter, unspecified    Past Surgical History  Procedure Laterality Date  . Thyroidectomy    . Tonsillectomy    . Colonoscopy  November 2011    External hemorrhoids, diverticulosis, anal papillae  . Insert / replace / remove pacemaker    . Vertebroplasty    . Femur im nail Left 12/29/2012    Procedure: INTRAMEDULLARY (IM) NAIL INTERTROCHANTRIC;  Surgeon: Melina Schools, MD;  Location: [place];  Service: Orthopedics;  Laterality: Left;  . Permanent pacemaker insertion N/A 06/07/2011    Procedure: PERMANENT PACEMAKER INSERTION;  Surgeon: Evans Lance, MD;  Location: [facility] CATH LAB;  Service: Cardiovascular;  Laterality: N/A;  . Colonoscopy N/A 04/01/2014    Procedure: COLONOSCOPY;  Surgeon: Rogene Houston, MD;  Location: [facility];  Service: Endoscopy;  Laterality: N/A;  930   Social History:   reports that she has never smoked. She has never used smokeless tobacco. She reports that she does not drink alcohol or use illicit drugs.  Family History  Problem Relation Age of Onset  . Heart attack Mother     Deceased  . Cancer Father     Deceased, colon cancer age 71  . Cancer Brother     Deceased, throat and lung    Medications:   Medication List       This list is accurate  as of: 12/08/14 12:31 PM.  Always use your most recent med list.               acetaminophen 325 MG tablet  Commonly known as:  TYLENOL  Take 650 mg by mouth every 6 (six) hours as needed (for pain or fever).     ALIGN 4 MG Caps  Take 1 capsule by mouth daily.     amLODipine 5 MG tablet  Commonly known as:  NORVASC  Take 1 tablet by mouth daily.     aspirin 325 MG tablet  Take 325 mg by mouth daily.     BENEFIBER DRINK MIX PO  Take 10 mLs by mouth  daily with breakfast. Mix 2 teaspoonfuls with water and drink daily.     beta carotene w/minerals tablet  Take 1 tablet by mouth 2 (two) times daily.     THERATRUM COMPLETE PO  Take 1 tablet by mouth 2 (two) times daily.     bisacodyl 10 MG/30ML Enem  Commonly known as:  FLEET  Place 10 mg rectally every other day as needed (for bowel movement).     cetirizine 10 MG tablet  Commonly known as:  ZYRTEC  Take 10 mg by mouth at bedtime.     cycloSPORINE 0.05 % ophthalmic emulsion  Commonly known as:  RESTASIS  Place 1 drop into both eyes 2 (two) times daily. *Self Administration*     docusate sodium 100 MG capsule  Commonly known as:  COLACE  Take 100 mg by mouth at bedtime.     fluticasone 50 MCG/ACT nasal spray  Commonly known as:  FLONASE  Place 2 sprays into the nose daily. For allergies     FUSION PLUS PO  Take 1 capsule by mouth daily.     GAVILAX powder  Generic drug:  polyethylene glycol powder  Take 1 Container by mouth daily.     hydrALAZINE 25 MG tablet  Commonly known as:  APRESOLINE  Take 25 mg by mouth 2 (two) times daily.     HYDROcodone-acetaminophen 5-325 MG per tablet  Commonly known as:  NORCO/VICODIN  Take 1-2 tablets by mouth every 4 (four) hours as needed for severe pain.     hydrocortisone 2.5 % rectal cream  Commonly known as:  ANUSOL-HC  Place rectally 3 (three) times daily.     levothyroxine 112 MCG tablet  Commonly known as:  SYNTHROID, LEVOTHROID  Take 112 mcg by mouth daily before breakfast.     lisinopril 20 MG tablet  Commonly known as:  PRINIVIL,ZESTRIL  Take 20 mg by mouth 2 (two) times daily.     loperamide 2 MG capsule  Commonly known as:  IMODIUM  Take 2 mg by mouth every 4 (four) hours as needed for diarrhea or loose stools.     Magnesium 250 MG Tabs  Take 1 tablet by mouth at bedtime.     Melatonin 1 MG Tabs  Take 1 tablet by mouth at bedtime.     methocarbamol 500 MG tablet  Commonly known as:  ROBAXIN  Take 1  tablet (500 mg total) by mouth every 6 (six) hours as needed for muscle spasms.     omeprazole 20 MG capsule  Commonly known as:  PRILOSEC  Take 20 mg by mouth every morning.     predniSONE 10 MG tablet  Commonly known as:  DELTASONE  Take 10 mg by mouth daily with breakfast.         Physical Exam: Danley Danker  Vitals:   12/08/14 1146  BP: 144/68  Pulse: 86  Temp: 98.2 F (36.8 C)  Resp: 16  SpO2: 92%    General- elderly female, well built, in no acute distress Head- normocephalic, atraumatic Nose- normal nasal mucosa, no maxillary or frontal sinus tenderness, no nasal discharge Throat- moist mucus membrane Eyes- PERRLA, EOMI, no pallor, no icterus, no discharge, normal conjunctiva, normal sclera Neck- no cervical lymphadenopathy Cardiovascular- normal s1,s2, palpable dorsalis pedis and radial pulses, no leg edema Respiratory- bilateral clear to auscultation, no wheeze, no rhonchi, no crackles, no use of accessory muscles Abdomen- bowel sounds present, soft, non tender Musculoskeletal- generalized weakness, left arm in sling, able to move her digits and has good capillary refill, right leg ROM limited Neurological- no focal deficit, alert and oriented  Skin- warm and dry, bruise on left thigh noted Psychiatry- normal mood and affect    Labs reviewed: Basic Metabolic Panel:  Recent Labs  12/04/14 0512 12/05/14 0507 12/06/14 0510  NA 129* 134* 133*  K 4.0 3.9 4.2  CL 98* 102 102  CO2 21* 26 25  GLUCOSE 84 97 91  BUN 14 14 12   CREATININE 0.95 0.82 0.80  CALCIUM 8.8* 8.3* 8.4*   Liver Function Tests:  Recent Labs  11/22/14 0507 12/03/14 1257  AST 20 22  ALT 19 17  ALKPHOS 51 103  BILITOT 0.6 0.8  PROT 5.4* 6.4*  ALBUMIN 3.5 3.6   No results for input(s): LIPASE, AMYLASE in the last 8760 hours. No results for input(s): AMMONIA in the last 8760 hours. CBC:  Recent Labs  04/19/14 0909  11/21/14 2126  12/03/14 1257 12/03/14 1800 12/04/14 0512  12/05/14 0507  WBC 5.4  < > 18.4*  < > 11.2* 10.0 8.0 9.3  NEUTROABS 2.9  --  15.4*  --  9.9*  --   --   --   HGB 10.4*  < > 11.0*  < > 9.4* 10.0* 9.5* 9.1*  HCT 31.6*  < > 32.2*  < > 27.1* 29.1* 29.2* 26.9*  MCV 93.5  < > 98.2  < > 94.8 95.7 97.7 98.9  PLT 419*  < > 280  < > 583* 599* 558* 561*  < > = values in this interval not displayed. Cardiac Enzymes:  Recent Labs  12/03/14 1257  TROPONINI <0.03    Radiological Exams: Dg Chest 2 View  12/03/2014   CLINICAL DATA:  [place] 5 days ago.  Broke left shoulder.  EXAM: CHEST  2 VIEW  COMPARISON:  11/21/2014  FINDINGS: Again noted is fracture of the proximal left humerus. Stable position of the left dual chamber cardiac pacemaker. Heart size is within normal limits. Negative for a pneumothorax. Lungs are clear without airspace disease. Heart size is within normal limits. Again noted is a compression fracture in the mid thoracic spine. No large pleural effusions. There is a mildly displaced fracture of the right seventh rib.  IMPRESSION: Mildly displaced fracture of the right seventh rib.  Known fracture of the proximal left humerus.  No acute lung findings.   Electronically Signed   By: Markus Daft M.D.   On: 12/03/2014 14:12   Dg Lumbar Spine Complete  12/03/2014   CLINICAL DATA:  [place] 5 days ago. Persistent back, pelvic and left shoulder pain.  EXAM: PELVIS - 1-2 VIEW; LEFT SHOULDER - 2+ VIEW; LUMBAR SPINE - COMPLETE 4+ VIEW  COMPARISON:  Pelvis films 11/21/2014. Lumbar spine CT scan 09/08/2012  FINDINGS: Pelvis:  Stable gamma nail and  dynamic left hip screw. The right hip is normally located. No acute right hip fracture. Ending inferior pubic ramus fracture is noted on the right side. No definite superior ramus fracture. The pubic symphysis and SI joints are intact. Stable calcified fibroid.  Lumbar spine:  Advanced degenerative lumbar spondylosis with multilevel disc disease and facet disease. No acute fracture is identified. Mild compression  deformities of T11 and T12 appears stable when compared to a prior scalp film from a CT scan. Moderate vascular calcifications without definite aneurysm. The SI joints are grossly normal.  Left shoulder:  There is a minimally displaced humeral neck fracture with mild medial rotation of the humeral head and slight impaction. The [facility] joint is intact. The visualized left ribs are intact.  IMPRESSION: 1. Mildly displaced left humeral neck fracture. 2. Right inferior pubic ramus fracture. 3. Advanced degenerative changes involving the spine but no acute fracture. Bold   Electronically Signed   By: Marijo Sanes M.D.   On: 12/03/2014 14:12   Dg Pelvis 1-2 Views  12/03/2014   CLINICAL DATA:  [place] 5 days ago. Persistent back, pelvic and left shoulder pain.  EXAM: PELVIS - 1-2 VIEW; LEFT SHOULDER - 2+ VIEW; LUMBAR SPINE - COMPLETE 4+ VIEW  COMPARISON:  Pelvis films 11/21/2014. Lumbar spine CT scan 09/08/2012  FINDINGS: Pelvis:  Stable gamma nail and dynamic left hip screw. The right hip is normally located. No acute right hip fracture. Ending inferior pubic ramus fracture is noted on the right side. No definite superior ramus fracture. The pubic symphysis and SI joints are intact. Stable calcified fibroid.  Lumbar spine:  Advanced degenerative lumbar spondylosis with multilevel disc disease and facet disease. No acute fracture is identified. Mild compression deformities of T11 and T12 appears stable when compared to a prior scalp film from a CT scan. Moderate vascular calcifications without definite aneurysm. The SI joints are grossly normal.  Left shoulder:  There is a minimally displaced humeral neck fracture with mild medial rotation of the humeral head and slight impaction. The [facility] joint is intact. The visualized left ribs are intact.  IMPRESSION: 1. Mildly displaced left humeral neck fracture. 2. Right inferior pubic ramus fracture. 3. Advanced degenerative changes involving the spine but no acute fracture. Bold    Electronically Signed   By: Marijo Sanes M.D.   On: 12/03/2014 14:12   Dg Shoulder Left  12/03/2014   CLINICAL DATA:  [place] 5 days ago. Persistent back, pelvic and left shoulder pain.  EXAM: PELVIS - 1-2 VIEW; LEFT SHOULDER - 2+ VIEW; LUMBAR SPINE - COMPLETE 4+ VIEW  COMPARISON:  Pelvis films 11/21/2014. Lumbar spine CT scan 09/08/2012  FINDINGS: Pelvis:  Stable gamma nail and dynamic left hip screw. The right hip is normally located. No acute right hip fracture. Ending inferior pubic ramus fracture is noted on the right side. No definite superior ramus fracture. The pubic symphysis and SI joints are intact. Stable calcified fibroid.  Lumbar spine:  Advanced degenerative lumbar spondylosis with multilevel disc disease and facet disease. No acute fracture is identified. Mild compression deformities of T11 and T12 appears stable when compared to a prior scalp film from a CT scan. Moderate vascular calcifications without definite aneurysm. The SI joints are grossly normal.  Left shoulder:  There is a minimally displaced humeral neck fracture with mild medial rotation of the humeral head and slight impaction. The [facility] joint is intact. The visualized left ribs are intact.  IMPRESSION: 1. Mildly displaced left humeral neck fracture.  2. Right inferior pubic ramus fracture. 3. Advanced degenerative changes involving the spine but no acute fracture. Bold   Electronically Signed   By: Marijo Sanes M.D.   On: 12/03/2014 14:12    Assessment/Plan  Unsteady gait Post right pubic rami fracture. Will have her work with physical therapy and occupational therapy team to help with gait training and muscle strengthening exercises.fall precautions. Skin care. Encourage to be out of bed.   Right pubic rami fracture On aspirin 325 mg daily for dvt prophylaxis. On norco 5-325 1-2 tab q4h prn pain and robaxin 500 mg q6h prn muscle spasm. WBAT. Monitor clinically. To follow up with orthopedics. Will have patient work with PT/OT as  tolerated to regain strength and restore function.  Fall precautions are in place.  Right 7th rib fracture Denies chest pain, continue prn pain medication and monitor  Left humerus fracture Has a sling in place, NWB to LUE for now, f/u with orthopedics, continue prn pain medication  Hyponatremia Chronic, stable, alert and oriented, encouraged fluid restriction  Constipation On colace 100 mg daily, benefiber, fleet enema prn, miralax daily and magnesium daily. Add linzess and reassess  HTN Stable bp reading. Continue amlodipine 5 mg daily, hyydralazine 25 mg bid, lisinopril 20 mg bid and monitor bp  Blood loss Anemia Lab Results  Component Value Date   WBC 9.3 12/05/2014   HGB 9.1* 12/05/2014   HCT 26.9* 12/05/2014   MCV 98.9 12/05/2014   PLT 561* 12/05/2014  monitor h&h. Continue iron supplement  Hypothyroidism Lab Results  Component Value Date   TSH 0.295* 11/21/2014  with her TSH being below normal, decrease levothyroxine to 100 mcg daily for now and check tsh in 4 weeks  gerd Controlled symptom, continue prilosec 10 mg daily and monitor  Internal hemorrhoids No rectal bleed reported. Continue anusol tid for now and fiber supplement  PMR Stable, continue prednisone 10 mg daily, increases risk of pathological fracture  Osteoporosis With multiple fracture and on prednisone, start ca-vit d supplement and monitor   Goals of care: short term rehabilitation   Labs/tests ordered: cbc with diff, cmp, tsh  Family/ staff Communication: reviewed care plan with patient and nursing supervisor    Blanchie Serve, MD  [facility] [phone] (Monday-Friday 8 am - 5 pm) [phone] (afterhours)

## 2014-12-15 ENCOUNTER — Non-Acute Institutional Stay (SKILLED_NURSING_FACILITY): Payer: Medicare Other | Admitting: Internal Medicine

## 2014-12-15 ENCOUNTER — Encounter: Payer: Self-pay | Admitting: *Deleted

## 2014-12-15 ENCOUNTER — Encounter: Payer: Self-pay | Admitting: Internal Medicine

## 2014-12-15 DIAGNOSIS — N179 Acute kidney failure, unspecified: Secondary | ICD-10-CM | POA: Diagnosis not present

## 2014-12-15 DIAGNOSIS — E039 Hypothyroidism, unspecified: Secondary | ICD-10-CM | POA: Diagnosis not present

## 2014-12-15 DIAGNOSIS — D62 Acute posthemorrhagic anemia: Secondary | ICD-10-CM

## 2014-12-15 DIAGNOSIS — E871 Hypo-osmolality and hyponatremia: Secondary | ICD-10-CM

## 2014-12-15 DIAGNOSIS — I10 Essential (primary) hypertension: Secondary | ICD-10-CM | POA: Diagnosis not present

## 2014-12-15 DIAGNOSIS — K5901 Slow transit constipation: Secondary | ICD-10-CM | POA: Diagnosis not present

## 2014-12-15 LAB — BASIC METABOLIC PANEL
BUN: 26 mg/dL — AB (ref 4–21)
Creatinine: 1.1 mg/dL (ref 0.5–1.1)
GLUCOSE: 79 mg/dL
POTASSIUM: 5.2 mmol/L (ref 3.4–5.3)
Sodium: 130 mmol/L — AB (ref 137–147)

## 2014-12-15 LAB — CBC AND DIFFERENTIAL
HCT: 27 % — AB (ref 36–46)
HEMOGLOBIN: 9 g/dL — AB (ref 12.0–16.0)
PLATELETS: 412 10*3/uL — AB (ref 150–399)
WBC: 6.5 10*3/mL

## 2014-12-15 LAB — TSH: TSH: 12.02 u[IU]/mL — AB (ref 0.41–5.90)

## 2014-12-15 LAB — HEPATIC FUNCTION PANEL
ALT: 10 U/L (ref 7–35)
AST: 13 U/L (ref 13–35)
Alkaline Phosphatase: 133 U/L — AB (ref 25–125)
Bilirubin, Total: 0.4 mg/dL

## 2014-12-15 NOTE — Progress Notes (Signed)
Patient ID: [name], female   DOB: [birth year], 81 y.o.   MRN: [id number]     Facility: [facility]   Chief Complaint  Patient presents with  . Acute Visit    Abnormal Labwork   Allergies  Allergen Reactions  . Tramadol Itching and Nausea Only  . Sulfa Antibiotics Nausea And Vomiting    HPI:  81 y.o. patient is seen for acute concern. She has been constipated. She was tried on linzess last visit but mentions miralax was working better. Her lab work has resulted and shows deranged thyroid function and renal function. she is here short term rehabilitation post hospital admission with right inferior pubic rami fracture and right 7th rib fracture along with her old proximal left humerus fracture. She has pmh of HTN, chronic hyponatremia, hypothyroidism, anemia among others. Her pain is under control with current regimen.  Review of Systems:  Constitutional: Negative for fever, chills, diaphoresis.  HENT: Negative for headache, congestion, nasal discharge  Eyes: Negative for eye pain, blurred vision, double vision and discharge.  Respiratory: Negative for cough, shortness of breath and wheezing.   Cardiovascular: Negative for chest pain, palpitations, leg swelling.  Gastrointestinal: Negative for heartburn, nausea, vomiting, abdominal pain Genitourinary: Negative for dysuria Musculoskeletal: Negative for back pain, falls Skin: Negative for itching, rash.  Neurological: Negative for dizziness, tingling, focal weakness Psychiatric/Behavioral: Negative for depression  Past Medical History  Diagnosis Date  . Hypertension   . Hypothyroidism   . Bradycardia   . Anemia   . Diverticulosis   . External hemorrhoids   . Constipation   . Macular degeneration   . Seasonal allergies   . Osteoporosis   . Degenerative joint disease   . Palpitations   . Hyperlipidemia   . GERD (gastroesophageal reflux disease)   . Pacemaker   . Thyrotoxicosis without mention  of goiter or other cause, without mention of thyrotoxic crisis or storm   . Goiter, unspecified      Medication List       This list is accurate as of: 12/15/14  6:08 PM.  Always use your most recent med list.               acetaminophen 325 MG tablet  Commonly known as:  TYLENOL  Take 650 mg by mouth every 6 (six) hours as needed (for pain or fever).     ALIGN 4 MG Caps  Take 1 capsule by mouth daily.     amLODipine 5 MG tablet  Commonly known as:  NORVASC  Take one tablet by mouth once daily for hypertension     aspirin 325 MG tablet  Take 325 mg by mouth daily.     BENEFIBER DRINK MIX PO  Take 10 mLs by mouth daily with breakfast. Mix 2 teaspoonfuls with water and drink daily.     beta carotene w/minerals tablet  Take 1 tablet by mouth 2 (two) times daily.     THERATRUM COMPLETE PO  Take 1 tablet by mouth 2 (two) times daily.     bisacodyl 10 MG/30ML Enem  Commonly known as:  FLEET  Place 10 mg rectally every other day as needed (for bowel movement).     cetirizine 10 MG tablet  Commonly known as:  ZYRTEC  Take 10 mg by mouth at bedtime.     cycloSPORINE 0.05 % ophthalmic emulsion  Commonly known as:  RESTASIS  Place 1 drop into both eyes 2 (two) times daily. *Self Administration*  docusate sodium 100 MG capsule  Commonly known as:  COLACE  Take one capsule by mouth once at bedtime for constipation     fluticasone 50 MCG/ACT nasal spray  Commonly known as:  FLONASE  Place 2 sprays into the nose daily. For allergies     FUSION PLUS PO  Take 1 capsule by mouth daily.     GAVILAX powder  Generic drug:  polyethylene glycol powder  Take 1 Container by mouth daily.     hydrALAZINE 25 MG tablet  Commonly known as:  APRESOLINE  Take one tablet by mouth twice daily for hypertension     HYDROcodone-acetaminophen 5-325 MG per tablet  Commonly known as:  NORCO/VICODIN  Take 1-2 tablets by mouth every 4 (four) hours as needed for severe pain.      hydrocortisone 2.5 % rectal cream  Commonly known as:  ANUSOL-HC  Place rectally 3 (three) times daily.     levothyroxine 125 MCG tablet  Commonly known as:  SYNTHROID, LEVOTHROID  Take one tablet by mouth 30 minutes before breakfast once daily for thyroid     lisinopril 20 MG tablet  Commonly known as:  PRINIVIL,ZESTRIL  Take one tablet by mouth once daily for hypertension     loperamide 2 MG capsule  Commonly known as:  IMODIUM  Take 2 mg by mouth every 4 (four) hours as needed for diarrhea or loose stools.     Magnesium 250 MG Tabs  Take 1 tablet by mouth at bedtime.     Melatonin 1 MG Tabs  Take one tablet by mouth once daily at bedtime for rest     methocarbamol 500 MG tablet  Commonly known as:  ROBAXIN  Take 1 tablet (500 mg total) by mouth every 6 (six) hours as needed for muscle spasms.     omeprazole 20 MG capsule  Commonly known as:  PRILOSEC  Take one tablet by mouth once every morning for GERD     predniSONE 10 MG tablet  Commonly known as:  DELTASONE  Take 10 mg by mouth daily with breakfast.        Physical exam BP 139/52 mmHg  Pulse 83  Temp(Src) 97.9 F (36.6 C) (Oral)  Resp 20  SpO2 94%   General- elderly female, well built, in no acute distress Head- normocephalic, atraumatic Eyes- PERRLA, EOMI, no pallor, no icterus, no discharge, normal conjunctiva, normal sclera Neck- no cervical lymphadenopathy Cardiovascular- normal s1,s2, palpable dorsalis pedis and radial pulses, no leg edema Respiratory- bilateral clear to auscultation, no wheeze, no rhonchi, no crackles, no use of accessory muscles Abdomen- bowel sounds present, soft, non tender Musculoskeletal- generalized weakness, left arm in sling, able to move her digits and has good capillary refill, right leg ROM limited Neurological- no focal deficit, alert and oriented  Skin- warm and dry Psychiatry- normal mood and affect   Labs CBC Latest Ref Rng 12/15/2014 12/05/2014 12/04/2014  WBC -  6.5 9.3 8.0  Hemoglobin 12.0 - 16.0 g/dL 9.0(A) 9.1(L) 9.5(L)  Hematocrit 36 - 46 % 27(A) 26.9(L) 29.2(L)  Platelets 150 - 399 K/L 412(A) 561(H) 558(H)    CMP Latest Ref Rng 12/15/2014 12/06/2014 12/05/2014  Glucose 65 - 99 mg/dL - 91 97  BUN 4 - 21 mg/dL 26(A) 12 14  Creatinine 0.5 - 1.1 mg/dL 1.1 0.80 0.82  Sodium 137 - 147 mmol/L 130(A) 133(L) 134(L)  Potassium 3.4 - 5.3 mmol/L 5.2 4.2 3.9  Chloride 101 - 111 mmol/L - 102 102  CO2  22 - 32 mmol/L - 25 26  Calcium 8.9 - 10.3 mg/dL - 8.4(L) 8.3(L)  Total Protein 6.5 - 8.1 g/dL - - -  Total Bilirubin 0.3 - 1.2 mg/dL - - -  Alkaline Phos 25 - 125 U/L 133(A) - -  AST 13 - 35 U/L 13 - -  ALT 7 - 35 U/L 10 - -    Lab Results  Component Value Date   TSH 12.02* 12/15/2014     Assessment/Plan  Hypothyroidism Reviewed tsh. Increase her levothyroxine to 125 mcg daily for now and monitor with repeat tsh check in 4 weeks  Constipation Her pain medication, limited mobility, worsened hypothyroidism are all contributing to this. On colace 100 mg daily, benefiber, fleet enema prn. Discontinue linzess and start miralax daily  Acute renal failure Decrease lisinopril to 20 mg daily for now, monitor bmp 8/29. If continues to worsen, consider decreasing lisinopril further  Hyponatremia Chronic, stable, alert and oriented, encouraged fluid restriction  HTN Soft bp reading. Continue amlodipine 5 mg daily, hyydralazine 25 mg bid and decrease lisinopril to 20 mg daily and monitor bp  Blood loss Anemia Hb 9 on recheck. Continue iron supplement for now and monitor clinically. Monitor cbc   Blanchie Serve, MD  [facility] [phone] (Monday-Friday 8 am - 5 pm) [phone] (afterhours)

## 2014-12-16 ENCOUNTER — Other Ambulatory Visit: Payer: Self-pay

## 2014-12-16 MED ORDER — HYDROCODONE-ACETAMINOPHEN 5-325 MG PO TABS: 1.0000 | ORAL_TABLET | ORAL | Status: DC | PRN

## 2014-12-16 NOTE — Telephone Encounter (Signed)
Rx faxed to [facility] @ [phone], phone number [phone]  

## 2014-12-17 DIAGNOSIS — S32511A Fracture of superior rim of right pubis, initial encounter for closed fracture: Secondary | ICD-10-CM | POA: Diagnosis not present

## 2014-12-17 DIAGNOSIS — S42212A Unspecified displaced fracture of surgical neck of left humerus, initial encounter for closed fracture: Secondary | ICD-10-CM | POA: Diagnosis not present

## 2014-12-23 ENCOUNTER — Encounter: Payer: Self-pay | Admitting: Cardiology

## 2014-12-29 ENCOUNTER — Non-Acute Institutional Stay (SKILLED_NURSING_FACILITY): Payer: Medicare Other | Admitting: Nurse Practitioner

## 2014-12-29 DIAGNOSIS — E039 Hypothyroidism, unspecified: Secondary | ICD-10-CM

## 2014-12-29 DIAGNOSIS — S42302S Unspecified fracture of shaft of humerus, left arm, sequela: Secondary | ICD-10-CM

## 2014-12-29 DIAGNOSIS — S2231XS Fracture of one rib, right side, sequela: Secondary | ICD-10-CM | POA: Diagnosis not present

## 2014-12-29 DIAGNOSIS — E871 Hypo-osmolality and hyponatremia: Secondary | ICD-10-CM | POA: Diagnosis not present

## 2014-12-29 DIAGNOSIS — I10 Essential (primary) hypertension: Secondary | ICD-10-CM | POA: Diagnosis not present

## 2014-12-29 DIAGNOSIS — S32502G Unspecified fracture of left pubis, subsequent encounter for fracture with delayed healing: Secondary | ICD-10-CM

## 2014-12-29 DIAGNOSIS — K5901 Slow transit constipation: Secondary | ICD-10-CM | POA: Diagnosis not present

## 2014-12-29 DIAGNOSIS — D62 Acute posthemorrhagic anemia: Secondary | ICD-10-CM | POA: Diagnosis not present

## 2014-12-29 DIAGNOSIS — S32592G Other specified fracture of left pubis, subsequent encounter for fracture with delayed healing: Secondary | ICD-10-CM

## 2014-12-29 NOTE — Progress Notes (Signed)
Patient ID: [name], female   DOB: 12-Sep-1922, 81 y.o.   MRN: [id number]    Nursing Home Location:  [place] of Service: SNF (31)  PCP: Delphina Cahill, MD  Allergies  Allergen Reactions  . Tramadol Itching and Nausea Only  . Sulfa Antibiotics Nausea And Vomiting    Chief Complaint  Patient presents with  . Discharge Note    HPI:  Patient is a 81 y.o. female seen today at [facility] for discharge back to ALF. She has pmh of HTN, chronic hyponatremia, hypothyroidism, and anemia pt is at [facility] place for short term rehabilitation post hospital admission from 12/03/14-12/06/14 with increased back and shoulder pain. Xray showed new right inferior pubic rami fracture and right 7th rib fracture along with her old proximal left humerus fracture.  rthopedics was consulted, conservative management with pain medication was decided upon. Pain has been well managed. She was also noted to have hyponatremia. She received iv fluids and sodium has been stable. Patient currently doing well with therapy, now stable to discharge home with home health.  Review of Systems:  Review of Systems  Constitutional: Negative for activity change, appetite change, fatigue and unexpected weight change.  HENT: Negative for congestion.   Eyes: Negative.   Respiratory: Negative for cough and shortness of breath.   Cardiovascular: Negative for chest pain, palpitations and leg swelling.  Gastrointestinal: Negative for abdominal pain, diarrhea and constipation.  Genitourinary: Negative for dysuria and difficulty urinating.  Musculoskeletal:       Pain well controlled  Skin: Negative for color change and wound.  Neurological: Negative for dizziness and weakness.  Psychiatric/Behavioral: Negative for behavioral problems, confusion and agitation.    Past Medical History  Diagnosis Date  . Hypertension   . Hypothyroidism   . Bradycardia   . Anemia   . Diverticulosis     . External hemorrhoids   . Constipation   . Macular degeneration   . Seasonal allergies   . Osteoporosis   . Degenerative joint disease   . Palpitations   . Hyperlipidemia   . GERD (gastroesophageal reflux disease)   . Pacemaker   . Thyrotoxicosis without mention of goiter or other cause, without mention of thyrotoxic crisis or storm   . Goiter, unspecified    Past Surgical History  Procedure Laterality Date  . Thyroidectomy    . Tonsillectomy    . Colonoscopy  November 2011    External hemorrhoids, diverticulosis, anal papillae  . Insert / replace / remove pacemaker    . Vertebroplasty    . Femur im nail Left 12/29/2012    Procedure: INTRAMEDULLARY (IM) NAIL INTERTROCHANTRIC;  Surgeon: Melina Schools, MD;  Location: [place];  Service: Orthopedics;  Laterality: Left;  . Permanent pacemaker insertion N/A 06/07/2011    Procedure: PERMANENT PACEMAKER INSERTION;  Surgeon: Evans Lance, MD;  Location: [facility] CATH LAB;  Service: Cardiovascular;  Laterality: N/A;  . Colonoscopy N/A 04/01/2014    Procedure: COLONOSCOPY;  Surgeon: Rogene Houston, MD;  Location: [facility];  Service: Endoscopy;  Laterality: N/A;  930   Social History:   reports that she has never smoked. She has never used smokeless tobacco. She reports that she does not drink alcohol or use illicit drugs.  Family History  Problem Relation Age of Onset  . Heart attack Mother     Deceased  . Cancer Father     Deceased, colon cancer age 36  .  Cancer Brother     Deceased, throat and lung    Medications: Patient's Medications  New Prescriptions   No medications on file  Previous Medications   ACETAMINOPHEN (TYLENOL) 325 MG TABLET    Take 650 mg by mouth every 6 (six) hours as needed (for pain or fever).   AMLODIPINE (NORVASC) 5 MG TABLET    Take one tablet by mouth once daily for hypertension   ASPIRIN 325 MG TABLET    Take 325 mg by mouth daily.   BETA CAROTENE W/MINERALS (OCUVITE) TABLET    Take 1 tablet by mouth 2  (two) times daily.   BISACODYL (FLEET) 10 MG/30ML ENEM    Place 10 mg rectally every other day as needed (for bowel movement).   CETIRIZINE (ZYRTEC) 10 MG TABLET    Take 10 mg by mouth at bedtime.   CYCLOSPORINE (RESTASIS) 0.05 % OPHTHALMIC EMULSION    Place 1 drop into both eyes 2 (two) times daily. *Self Administration*   DOCUSATE SODIUM (COLACE) 100 MG CAPSULE    Take one capsule by mouth once at bedtime for constipation   FLUTICASONE (FLONASE) 50 MCG/ACT NASAL SPRAY    Place 2 sprays into the nose daily. For allergies   HYDRALAZINE (APRESOLINE) 25 MG TABLET    Take one tablet by mouth twice daily for hypertension   HYDROCODONE-ACETAMINOPHEN (NORCO/VICODIN) 5-325 MG PER TABLET    Take 1-2 tablets by mouth every 4 (four) hours as needed for severe pain. DO NOT EXCEED 4 GM APAP IN 24 HOURS   HYDROCORTISONE (ANUSOL-HC) 2.5 % RECTAL CREAM    Place rectally 3 (three) times daily.   IRON-FA-B CMP-C-BIOT-PROBIOTIC (FUSION PLUS PO)    Take 1 capsule by mouth daily.   LEVOTHYROXINE (SYNTHROID, LEVOTHROID) 125 MCG TABLET    Take one tablet by mouth 30 minutes before breakfast once daily for thyroid   LISINOPRIL (PRINIVIL,ZESTRIL) 20 MG TABLET    Take one tablet by mouth once daily for hypertension   LOPERAMIDE (IMODIUM) 2 MG CAPSULE    Take 2 mg by mouth every 4 (four) hours as needed for diarrhea or loose stools.   MAGNESIUM 250 MG TABS    Take 1 tablet by mouth at bedtime.    MELATONIN 1 MG TABS    Take one tablet by mouth once daily at bedtime for rest   METHOCARBAMOL (ROBAXIN) 500 MG TABLET    Take 1 tablet (500 mg total) by mouth every 6 (six) hours as needed for muscle spasms.   MULTIPLE VITAMINS-MINERALS (THERATRUM COMPLETE PO)    Take 1 tablet by mouth 2 (two) times daily.   OMEPRAZOLE (PRILOSEC) 20 MG CAPSULE    Take one tablet by mouth once every morning for GERD   POLYETHYLENE GLYCOL POWDER (GAVILAX) POWDER    Take 1 Container by mouth daily.   PREDNISONE (DELTASONE) 10 MG TABLET    Take 10 mg  by mouth daily with breakfast.    PROBIOTIC PRODUCT (ALIGN) 4 MG CAPS    Take 1 capsule by mouth daily.   WHEAT DEXTRIN (BENEFIBER DRINK MIX PO)    Take 10 mLs by mouth daily with breakfast. Mix 2 teaspoonfuls with water and drink daily.  Modified Medications   No medications on file  Discontinued Medications   No medications on file     Physical Exam: Filed Vitals:   12/29/14 1252  BP: 133/66  Pulse: 74  Temp: 97.3 F (36.3 C)  Resp: 16    Physical Exam  Constitutional:  She is oriented to person, place, and time. She appears well-developed and well-nourished. No distress.  HENT:  Head: Normocephalic and atraumatic.  Mouth/Throat: Oropharynx is clear and moist. No oropharyngeal exudate.  Eyes: Conjunctivae are normal. Pupils are equal, round, and reactive to light.  Neck: Normal range of motion. Neck supple.  Cardiovascular: Normal rate, regular rhythm and normal heart sounds.   Pulmonary/Chest: Effort normal and breath sounds normal.  Abdominal: Soft. Bowel sounds are normal.  Musculoskeletal: She exhibits no edema or tenderness.  Left arm in sling, decreased ROM   Neurological: She is alert and oriented to person, place, and time.  Skin: Skin is warm and dry. She is not diaphoretic.  Psychiatric: She has a normal mood and affect.    Labs reviewed: Basic Metabolic Panel:  Recent Labs  12/04/14 0512 12/05/14 0507 12/06/14 0510 12/15/14  NA 129* 134* 133* 130*  K 4.0 3.9 4.2 5.2  CL 98* 102 102  --   CO2 21* 26 25  --   GLUCOSE 84 97 91  --   BUN 14 14 12  26*  CREATININE 0.95 0.82 0.80 1.1  CALCIUM 8.8* 8.3* 8.4*  --    Liver Function Tests:  Recent Labs  11/22/14 0507 12/03/14 1257 12/15/14  AST 20 22 13   ALT 19 17 10   ALKPHOS 51 103 133*  BILITOT 0.6 0.8  --   PROT 5.4* 6.4*  --   ALBUMIN 3.5 3.6  --    No results for input(s): LIPASE, AMYLASE in the last 8760 hours. No results for input(s): AMMONIA in the last 8760 hours. CBC:  Recent Labs   04/19/14 0909  11/21/14 2126  12/03/14 1257 12/03/14 1800 12/04/14 0512 12/05/14 0507 12/15/14  WBC 5.4  < > 18.4*  < > 11.2* 10.0 8.0 9.3 6.5  NEUTROABS 2.9  --  15.4*  --  9.9*  --   --   --   --   HGB 10.4*  < > 11.0*  < > 9.4* 10.0* 9.5* 9.1* 9.0*  HCT 31.6*  < > 32.2*  < > 27.1* 29.1* 29.2* 26.9* 27*  MCV 93.5  < > 98.2  < > 94.8 95.7 97.7 98.9  --   PLT 419*  < > 280  < > 583* 599* 558* 561* 412*  < > = values in this interval not displayed. TSH:  Recent Labs  11/21/14 2126 12/15/14  TSH 0.295* 12.02*   A1C: No results found for: HGBA1C Lipid Panel: No results for input(s): CHOL, HDL, LDLCALC, TRIG, CHOLHDL, LDLDIRECT in the last 8760 hours.  CBC (without Differential) Final 12/20/2014 1:28PM TEST RESULT REF RANGE UNIT White Blood Cell (WBC) 6.3 3.8-10.8 k/uL Red Blood Cell (RBC) L 3.0 3.5-5.5 M/uL Hemoglobin (HGB) L 9.8 11.4-16.0 g/dL Hematocrit (HCT) L 30.8 33.0-48.0 % Mean Corpuscular Volume (MCV) H 102.0 79.0-100.0 fL Mean Corpuscular HGB (MCH) 32.5 26.8-33.2 pg Mean Corpuscular HGB Conc (MCHC) 31.8 30.5-36.0 g/dL RBC Dist Width (RDW) H 18.7 9.0-15.0 % Platelet H 457 150-450 k/uL Basic Metabolic Panel Final 85/63/1497 2:11PM TEST RESULT REF RANGE UNIT SODIUM L 132 135-146 mmol/L POTASSIUM 4.4 3.5-5.3 mmol/L CHLORIDE 95 95-109 mmol/L CO2 27.9 21.0-31.0 mmol/L Anion Gap 9 5-21 meq/L GLUCOSE 80 70-105 mg/dL UREA NITROGEN (BUN) 18 5-25 mg/dL Creatinine, Serum 0.90 0.60-1.30 mg/dL BUN/CREATININE RATIO 20.0 6.0-25.0 Ratio CALCIUM 8.7 8.2-10.5 mg/dL Glomerular Filtration Rate > 60 > 60 mls/min/1.73 m2 Glomerular Filtration Rate - African American > 60 > 60 mls/min/1.73 m2  Radiological Exams: Dg  Chest 2 View  12/03/2014   CLINICAL DATA:  [place] 5 days ago.  Broke left shoulder.  EXAM: CHEST  2 VIEW  COMPARISON:  11/21/2014  FINDINGS: Again noted is fracture of the proximal left humerus. Stable position of the left dual chamber cardiac pacemaker. Heart  size is within normal limits. Negative for a pneumothorax. Lungs are clear without airspace disease. Heart size is within normal limits. Again noted is a compression fracture in the mid thoracic spine. No large pleural effusions. There is a mildly displaced fracture of the right seventh rib.  IMPRESSION: Mildly displaced fracture of the right seventh rib.  Known fracture of the proximal left humerus.  No acute lung findings.   Electronically Signed   By: Markus Daft M.D.   On: 12/03/2014 14:12   Dg Lumbar Spine Complete  12/03/2014   CLINICAL DATA:  [place] 5 days ago. Persistent back, pelvic and left shoulder pain.  EXAM: PELVIS - 1-2 VIEW; LEFT SHOULDER - 2+ VIEW; LUMBAR SPINE - COMPLETE 4+ VIEW  COMPARISON:  Pelvis films 11/21/2014. Lumbar spine CT scan 09/08/2012  FINDINGS: Pelvis:  Stable gamma nail and dynamic left hip screw. The right hip is normally located. No acute right hip fracture. Ending inferior pubic ramus fracture is noted on the right side. No definite superior ramus fracture. The pubic symphysis and SI joints are intact. Stable calcified fibroid.  Lumbar spine:  Advanced degenerative lumbar spondylosis with multilevel disc disease and facet disease. No acute fracture is identified. Mild compression deformities of T11 and T12 appears stable when compared to a prior scalp film from a CT scan. Moderate vascular calcifications without definite aneurysm. The SI joints are grossly normal.  Left shoulder:  There is a minimally displaced humeral neck fracture with mild medial rotation of the humeral head and slight impaction. The [facility] joint is intact. The visualized left ribs are intact.  IMPRESSION: 1. Mildly displaced left humeral neck fracture. 2. Right inferior pubic ramus fracture. 3. Advanced degenerative changes involving the spine but no acute fracture. Bold   Electronically Signed   By: Marijo Sanes M.D.   On: 12/03/2014 14:12   Dg Pelvis 1-2 Views  12/03/2014   CLINICAL DATA:  [place] 5 days ago.  Persistent back, pelvic and left shoulder pain.  EXAM: PELVIS - 1-2 VIEW; LEFT SHOULDER - 2+ VIEW; LUMBAR SPINE - COMPLETE 4+ VIEW  COMPARISON:  Pelvis films 11/21/2014. Lumbar spine CT scan 09/08/2012  FINDINGS: Pelvis:  Stable gamma nail and dynamic left hip screw. The right hip is normally located. No acute right hip fracture. Ending inferior pubic ramus fracture is noted on the right side. No definite superior ramus fracture. The pubic symphysis and SI joints are intact. Stable calcified fibroid.  Lumbar spine:  Advanced degenerative lumbar spondylosis with multilevel disc disease and facet disease. No acute fracture is identified. Mild compression deformities of T11 and T12 appears stable when compared to a prior scalp film from a CT scan. Moderate vascular calcifications without definite aneurysm. The SI joints are grossly normal.  Left shoulder:  There is a minimally displaced humeral neck fracture with mild medial rotation of the humeral head and slight impaction. The [facility] joint is intact. The visualized left ribs are intact.  IMPRESSION: 1. Mildly displaced left humeral neck fracture. 2. Right inferior pubic ramus fracture. 3. Advanced degenerative changes involving the spine but no acute fracture. Bold   Electronically Signed   By: Marijo Sanes M.D.   On: 12/03/2014 14:12  Dg Shoulder Left  12/03/2014   CLINICAL DATA:  [place] 5 days ago. Persistent back, pelvic and left shoulder pain.  EXAM: PELVIS - 1-2 VIEW; LEFT SHOULDER - 2+ VIEW; LUMBAR SPINE - COMPLETE 4+ VIEW  COMPARISON:  Pelvis films 11/21/2014. Lumbar spine CT scan 09/08/2012  FINDINGS: Pelvis:  Stable gamma nail and dynamic left hip screw. The right hip is normally located. No acute right hip fracture. Ending inferior pubic ramus fracture is noted on the right side. No definite superior ramus fracture. The pubic symphysis and SI joints are intact. Stable calcified fibroid.  Lumbar spine:  Advanced degenerative lumbar spondylosis with multilevel  disc disease and facet disease. No acute fracture is identified. Mild compression deformities of T11 and T12 appears stable when compared to a prior scalp film from a CT scan. Moderate vascular calcifications without definite aneurysm. The SI joints are grossly normal.  Left shoulder:  There is a minimally displaced humeral neck fracture with mild medial rotation of the humeral head and slight impaction. The [facility] joint is intact. The visualized left ribs are intact.  IMPRESSION: 1. Mildly displaced left humeral neck fracture. 2. Right inferior pubic ramus fracture. 3. Advanced degenerative changes involving the spine but no acute fracture. Bold   Electronically Signed   By: Marijo Sanes M.D.   On: 12/03/2014 14:12    Assessment/Plan 1. Hypothyroidism, unspecified hypothyroidism type TSH elevated to 12 on 12/15/14, at that time synthroid was increased to 125 mcgs, will need TSH follow up in 4 weeks.   2. Slow transit constipation Pt stable on current regimen. Without complaints of constipation at this time.   3. Essential hypertension, benign Blood pressure stable, conts on amlodipine 5 mg daily, hyydralazine 25 mg bid, lisinopril 20 mg daily   4. Hyponatremia Chronic, Sodium remains stable at 132. Cont fluid restriction.   5. Acute blood loss anemia hgb improved to 9.8, cont MVI and will need outpatient follow up.   6. Fracture of multiple pubic rami, left, with delayed healing, subsequent encounter  pain controlled on current regimen. Cont on norco 5-325 1-2 tab q4h prn pain and robaxin 500 mg q6h prn muscle spasm. WBAT. On aspirin 325 mg daily for dvt prophylaxis. -ongoing fall precautions needed.   7. Right rib fracture, sequela Pain well controlled, no increased pain with breathing or chest discomfort  8. Humerus fracture, left, sequela Has a sling in place, pain well controlled, AROM to elbow per ortho.   pt is stable for discharge-will need PT/OT per home health. No DME needed. Rx  written.  will need to follow up with PCP within 2 weeks.    Carlos American. Harle Battiest  [facility] [phone] 8 am - 5 pm) [phone] (after hours)

## 2015-01-03 DIAGNOSIS — M80022S Age-related osteoporosis with current pathological fracture, left humerus, sequela: Secondary | ICD-10-CM | POA: Diagnosis not present

## 2015-01-03 DIAGNOSIS — Z87310 Personal history of (healed) osteoporosis fracture: Secondary | ICD-10-CM | POA: Diagnosis not present

## 2015-01-03 DIAGNOSIS — M80052G Age-related osteoporosis with current pathological fracture, left femur, subsequent encounter for fracture with delayed healing: Secondary | ICD-10-CM | POA: Diagnosis not present

## 2015-01-03 DIAGNOSIS — H3530 Unspecified macular degeneration: Secondary | ICD-10-CM | POA: Diagnosis not present

## 2015-01-03 DIAGNOSIS — I10 Essential (primary) hypertension: Secondary | ICD-10-CM | POA: Diagnosis not present

## 2015-01-03 DIAGNOSIS — M353 Polymyalgia rheumatica: Secondary | ICD-10-CM | POA: Diagnosis not present

## 2015-01-03 DIAGNOSIS — N390 Urinary tract infection, site not specified: Secondary | ICD-10-CM | POA: Diagnosis not present

## 2015-01-03 DIAGNOSIS — Z950 Presence of cardiac pacemaker: Secondary | ICD-10-CM | POA: Diagnosis not present

## 2015-01-03 DIAGNOSIS — Z9181 History of falling: Secondary | ICD-10-CM | POA: Diagnosis not present

## 2015-01-03 DIAGNOSIS — M1990 Unspecified osteoarthritis, unspecified site: Secondary | ICD-10-CM | POA: Diagnosis not present

## 2015-01-05 DIAGNOSIS — H3530 Unspecified macular degeneration: Secondary | ICD-10-CM | POA: Diagnosis not present

## 2015-01-05 DIAGNOSIS — M353 Polymyalgia rheumatica: Secondary | ICD-10-CM | POA: Diagnosis not present

## 2015-01-05 DIAGNOSIS — M1990 Unspecified osteoarthritis, unspecified site: Secondary | ICD-10-CM | POA: Diagnosis not present

## 2015-01-05 DIAGNOSIS — M80022S Age-related osteoporosis with current pathological fracture, left humerus, sequela: Secondary | ICD-10-CM | POA: Diagnosis not present

## 2015-01-05 DIAGNOSIS — M80052G Age-related osteoporosis with current pathological fracture, left femur, subsequent encounter for fracture with delayed healing: Secondary | ICD-10-CM | POA: Diagnosis not present

## 2015-01-05 DIAGNOSIS — N390 Urinary tract infection, site not specified: Secondary | ICD-10-CM | POA: Diagnosis not present

## 2015-01-07 DIAGNOSIS — N390 Urinary tract infection, site not specified: Secondary | ICD-10-CM | POA: Diagnosis not present

## 2015-01-07 DIAGNOSIS — M80022S Age-related osteoporosis with current pathological fracture, left humerus, sequela: Secondary | ICD-10-CM | POA: Diagnosis not present

## 2015-01-07 DIAGNOSIS — M80052G Age-related osteoporosis with current pathological fracture, left femur, subsequent encounter for fracture with delayed healing: Secondary | ICD-10-CM | POA: Diagnosis not present

## 2015-01-07 DIAGNOSIS — M353 Polymyalgia rheumatica: Secondary | ICD-10-CM | POA: Diagnosis not present

## 2015-01-07 DIAGNOSIS — H3530 Unspecified macular degeneration: Secondary | ICD-10-CM | POA: Diagnosis not present

## 2015-01-07 DIAGNOSIS — M1990 Unspecified osteoarthritis, unspecified site: Secondary | ICD-10-CM | POA: Diagnosis not present

## 2015-01-10 DIAGNOSIS — M80052G Age-related osteoporosis with current pathological fracture, left femur, subsequent encounter for fracture with delayed healing: Secondary | ICD-10-CM | POA: Diagnosis not present

## 2015-01-10 DIAGNOSIS — H3530 Unspecified macular degeneration: Secondary | ICD-10-CM | POA: Diagnosis not present

## 2015-01-10 DIAGNOSIS — M353 Polymyalgia rheumatica: Secondary | ICD-10-CM | POA: Diagnosis not present

## 2015-01-10 DIAGNOSIS — M80022S Age-related osteoporosis with current pathological fracture, left humerus, sequela: Secondary | ICD-10-CM | POA: Diagnosis not present

## 2015-01-10 DIAGNOSIS — M1990 Unspecified osteoarthritis, unspecified site: Secondary | ICD-10-CM | POA: Diagnosis not present

## 2015-01-10 DIAGNOSIS — N390 Urinary tract infection, site not specified: Secondary | ICD-10-CM | POA: Diagnosis not present

## 2015-01-11 DIAGNOSIS — H3530 Unspecified macular degeneration: Secondary | ICD-10-CM | POA: Diagnosis not present

## 2015-01-11 DIAGNOSIS — M80022S Age-related osteoporosis with current pathological fracture, left humerus, sequela: Secondary | ICD-10-CM | POA: Diagnosis not present

## 2015-01-11 DIAGNOSIS — M80052G Age-related osteoporosis with current pathological fracture, left femur, subsequent encounter for fracture with delayed healing: Secondary | ICD-10-CM | POA: Diagnosis not present

## 2015-01-11 DIAGNOSIS — M1990 Unspecified osteoarthritis, unspecified site: Secondary | ICD-10-CM | POA: Diagnosis not present

## 2015-01-11 DIAGNOSIS — N390 Urinary tract infection, site not specified: Secondary | ICD-10-CM | POA: Diagnosis not present

## 2015-01-11 DIAGNOSIS — M353 Polymyalgia rheumatica: Secondary | ICD-10-CM | POA: Diagnosis not present

## 2015-01-12 DIAGNOSIS — M80052G Age-related osteoporosis with current pathological fracture, left femur, subsequent encounter for fracture with delayed healing: Secondary | ICD-10-CM | POA: Diagnosis not present

## 2015-01-12 DIAGNOSIS — M353 Polymyalgia rheumatica: Secondary | ICD-10-CM | POA: Diagnosis not present

## 2015-01-12 DIAGNOSIS — M80022S Age-related osteoporosis with current pathological fracture, left humerus, sequela: Secondary | ICD-10-CM | POA: Diagnosis not present

## 2015-01-12 DIAGNOSIS — H3530 Unspecified macular degeneration: Secondary | ICD-10-CM | POA: Diagnosis not present

## 2015-01-12 DIAGNOSIS — M1990 Unspecified osteoarthritis, unspecified site: Secondary | ICD-10-CM | POA: Diagnosis not present

## 2015-01-12 DIAGNOSIS — N390 Urinary tract infection, site not specified: Secondary | ICD-10-CM | POA: Diagnosis not present

## 2015-01-13 DIAGNOSIS — M353 Polymyalgia rheumatica: Secondary | ICD-10-CM | POA: Diagnosis not present

## 2015-01-13 DIAGNOSIS — N390 Urinary tract infection, site not specified: Secondary | ICD-10-CM | POA: Diagnosis not present

## 2015-01-13 DIAGNOSIS — H3530 Unspecified macular degeneration: Secondary | ICD-10-CM | POA: Diagnosis not present

## 2015-01-13 DIAGNOSIS — M80022S Age-related osteoporosis with current pathological fracture, left humerus, sequela: Secondary | ICD-10-CM | POA: Diagnosis not present

## 2015-01-13 DIAGNOSIS — M1990 Unspecified osteoarthritis, unspecified site: Secondary | ICD-10-CM | POA: Diagnosis not present

## 2015-01-13 DIAGNOSIS — M80052G Age-related osteoporosis with current pathological fracture, left femur, subsequent encounter for fracture with delayed healing: Secondary | ICD-10-CM | POA: Diagnosis not present

## 2015-01-17 DIAGNOSIS — S32511D Fracture of superior rim of right pubis, subsequent encounter for fracture with routine healing: Secondary | ICD-10-CM | POA: Diagnosis not present

## 2015-01-17 DIAGNOSIS — S42212D Unspecified displaced fracture of surgical neck of left humerus, subsequent encounter for fracture with routine healing: Secondary | ICD-10-CM | POA: Diagnosis not present

## 2015-01-18 DIAGNOSIS — M80052G Age-related osteoporosis with current pathological fracture, left femur, subsequent encounter for fracture with delayed healing: Secondary | ICD-10-CM | POA: Diagnosis not present

## 2015-01-18 DIAGNOSIS — M1990 Unspecified osteoarthritis, unspecified site: Secondary | ICD-10-CM | POA: Diagnosis not present

## 2015-01-18 DIAGNOSIS — H3530 Unspecified macular degeneration: Secondary | ICD-10-CM | POA: Diagnosis not present

## 2015-01-18 DIAGNOSIS — M80022S Age-related osteoporosis with current pathological fracture, left humerus, sequela: Secondary | ICD-10-CM | POA: Diagnosis not present

## 2015-01-18 DIAGNOSIS — Z23 Encounter for immunization: Secondary | ICD-10-CM | POA: Diagnosis not present

## 2015-01-18 DIAGNOSIS — M353 Polymyalgia rheumatica: Secondary | ICD-10-CM | POA: Diagnosis not present

## 2015-01-18 DIAGNOSIS — N390 Urinary tract infection, site not specified: Secondary | ICD-10-CM | POA: Diagnosis not present

## 2015-01-19 DIAGNOSIS — M80022S Age-related osteoporosis with current pathological fracture, left humerus, sequela: Secondary | ICD-10-CM | POA: Diagnosis not present

## 2015-01-19 DIAGNOSIS — N390 Urinary tract infection, site not specified: Secondary | ICD-10-CM | POA: Diagnosis not present

## 2015-01-19 DIAGNOSIS — M353 Polymyalgia rheumatica: Secondary | ICD-10-CM | POA: Diagnosis not present

## 2015-01-19 DIAGNOSIS — M80052G Age-related osteoporosis with current pathological fracture, left femur, subsequent encounter for fracture with delayed healing: Secondary | ICD-10-CM | POA: Diagnosis not present

## 2015-01-19 DIAGNOSIS — H3530 Unspecified macular degeneration: Secondary | ICD-10-CM | POA: Diagnosis not present

## 2015-01-19 DIAGNOSIS — M1990 Unspecified osteoarthritis, unspecified site: Secondary | ICD-10-CM | POA: Diagnosis not present

## 2015-01-20 DIAGNOSIS — M80022S Age-related osteoporosis with current pathological fracture, left humerus, sequela: Secondary | ICD-10-CM | POA: Diagnosis not present

## 2015-01-20 DIAGNOSIS — H3530 Unspecified macular degeneration: Secondary | ICD-10-CM | POA: Diagnosis not present

## 2015-01-20 DIAGNOSIS — M353 Polymyalgia rheumatica: Secondary | ICD-10-CM | POA: Diagnosis not present

## 2015-01-20 DIAGNOSIS — N390 Urinary tract infection, site not specified: Secondary | ICD-10-CM | POA: Diagnosis not present

## 2015-01-20 DIAGNOSIS — M80052G Age-related osteoporosis with current pathological fracture, left femur, subsequent encounter for fracture with delayed healing: Secondary | ICD-10-CM | POA: Diagnosis not present

## 2015-01-20 DIAGNOSIS — M1990 Unspecified osteoarthritis, unspecified site: Secondary | ICD-10-CM | POA: Diagnosis not present

## 2015-01-21 DIAGNOSIS — H3531 Nonexudative age-related macular degeneration: Secondary | ICD-10-CM | POA: Diagnosis not present

## 2015-01-21 DIAGNOSIS — M353 Polymyalgia rheumatica: Secondary | ICD-10-CM | POA: Diagnosis not present

## 2015-01-21 DIAGNOSIS — H3530 Unspecified macular degeneration: Secondary | ICD-10-CM | POA: Diagnosis not present

## 2015-01-21 DIAGNOSIS — H3532 Exudative age-related macular degeneration: Secondary | ICD-10-CM | POA: Diagnosis not present

## 2015-01-21 DIAGNOSIS — N390 Urinary tract infection, site not specified: Secondary | ICD-10-CM | POA: Diagnosis not present

## 2015-01-21 DIAGNOSIS — M80052G Age-related osteoporosis with current pathological fracture, left femur, subsequent encounter for fracture with delayed healing: Secondary | ICD-10-CM | POA: Diagnosis not present

## 2015-01-21 DIAGNOSIS — M80022S Age-related osteoporosis with current pathological fracture, left humerus, sequela: Secondary | ICD-10-CM | POA: Diagnosis not present

## 2015-01-21 DIAGNOSIS — M1990 Unspecified osteoarthritis, unspecified site: Secondary | ICD-10-CM | POA: Diagnosis not present

## 2015-01-21 DIAGNOSIS — H35363 Drusen (degenerative) of macula, bilateral: Secondary | ICD-10-CM | POA: Diagnosis not present

## 2015-01-24 DIAGNOSIS — H3530 Unspecified macular degeneration: Secondary | ICD-10-CM | POA: Diagnosis not present

## 2015-01-24 DIAGNOSIS — M1990 Unspecified osteoarthritis, unspecified site: Secondary | ICD-10-CM | POA: Diagnosis not present

## 2015-01-24 DIAGNOSIS — M80022S Age-related osteoporosis with current pathological fracture, left humerus, sequela: Secondary | ICD-10-CM | POA: Diagnosis not present

## 2015-01-24 DIAGNOSIS — M353 Polymyalgia rheumatica: Secondary | ICD-10-CM | POA: Diagnosis not present

## 2015-01-24 DIAGNOSIS — M80052G Age-related osteoporosis with current pathological fracture, left femur, subsequent encounter for fracture with delayed healing: Secondary | ICD-10-CM | POA: Diagnosis not present

## 2015-01-24 DIAGNOSIS — N390 Urinary tract infection, site not specified: Secondary | ICD-10-CM | POA: Diagnosis not present

## 2015-01-26 DIAGNOSIS — M80022S Age-related osteoporosis with current pathological fracture, left humerus, sequela: Secondary | ICD-10-CM | POA: Diagnosis not present

## 2015-01-26 DIAGNOSIS — M1990 Unspecified osteoarthritis, unspecified site: Secondary | ICD-10-CM | POA: Diagnosis not present

## 2015-01-26 DIAGNOSIS — H3530 Unspecified macular degeneration: Secondary | ICD-10-CM | POA: Diagnosis not present

## 2015-01-26 DIAGNOSIS — M353 Polymyalgia rheumatica: Secondary | ICD-10-CM | POA: Diagnosis not present

## 2015-01-26 DIAGNOSIS — M80052G Age-related osteoporosis with current pathological fracture, left femur, subsequent encounter for fracture with delayed healing: Secondary | ICD-10-CM | POA: Diagnosis not present

## 2015-01-26 DIAGNOSIS — N390 Urinary tract infection, site not specified: Secondary | ICD-10-CM | POA: Diagnosis not present

## 2015-01-27 DIAGNOSIS — M353 Polymyalgia rheumatica: Secondary | ICD-10-CM | POA: Diagnosis not present

## 2015-01-27 DIAGNOSIS — M1990 Unspecified osteoarthritis, unspecified site: Secondary | ICD-10-CM | POA: Diagnosis not present

## 2015-01-27 DIAGNOSIS — H3530 Unspecified macular degeneration: Secondary | ICD-10-CM | POA: Diagnosis not present

## 2015-01-27 DIAGNOSIS — M80022S Age-related osteoporosis with current pathological fracture, left humerus, sequela: Secondary | ICD-10-CM | POA: Diagnosis not present

## 2015-01-27 DIAGNOSIS — M80052G Age-related osteoporosis with current pathological fracture, left femur, subsequent encounter for fracture with delayed healing: Secondary | ICD-10-CM | POA: Diagnosis not present

## 2015-01-27 DIAGNOSIS — N390 Urinary tract infection, site not specified: Secondary | ICD-10-CM | POA: Diagnosis not present

## 2015-01-28 DIAGNOSIS — M353 Polymyalgia rheumatica: Secondary | ICD-10-CM | POA: Diagnosis not present

## 2015-01-28 DIAGNOSIS — M80052G Age-related osteoporosis with current pathological fracture, left femur, subsequent encounter for fracture with delayed healing: Secondary | ICD-10-CM | POA: Diagnosis not present

## 2015-01-28 DIAGNOSIS — H3530 Unspecified macular degeneration: Secondary | ICD-10-CM | POA: Diagnosis not present

## 2015-01-28 DIAGNOSIS — M80022S Age-related osteoporosis with current pathological fracture, left humerus, sequela: Secondary | ICD-10-CM | POA: Diagnosis not present

## 2015-01-28 DIAGNOSIS — M1990 Unspecified osteoarthritis, unspecified site: Secondary | ICD-10-CM | POA: Diagnosis not present

## 2015-01-28 DIAGNOSIS — N390 Urinary tract infection, site not specified: Secondary | ICD-10-CM | POA: Diagnosis not present

## 2015-01-31 DIAGNOSIS — M1990 Unspecified osteoarthritis, unspecified site: Secondary | ICD-10-CM | POA: Diagnosis not present

## 2015-01-31 DIAGNOSIS — M353 Polymyalgia rheumatica: Secondary | ICD-10-CM | POA: Diagnosis not present

## 2015-01-31 DIAGNOSIS — E039 Hypothyroidism, unspecified: Secondary | ICD-10-CM | POA: Diagnosis not present

## 2015-01-31 DIAGNOSIS — M80022S Age-related osteoporosis with current pathological fracture, left humerus, sequela: Secondary | ICD-10-CM | POA: Diagnosis not present

## 2015-01-31 DIAGNOSIS — K5909 Other constipation: Secondary | ICD-10-CM | POA: Diagnosis not present

## 2015-01-31 DIAGNOSIS — D649 Anemia, unspecified: Secondary | ICD-10-CM | POA: Diagnosis not present

## 2015-01-31 DIAGNOSIS — N390 Urinary tract infection, site not specified: Secondary | ICD-10-CM | POA: Diagnosis not present

## 2015-01-31 DIAGNOSIS — M545 Low back pain: Secondary | ICD-10-CM | POA: Diagnosis not present

## 2015-01-31 DIAGNOSIS — H3530 Unspecified macular degeneration: Secondary | ICD-10-CM | POA: Diagnosis not present

## 2015-01-31 DIAGNOSIS — M80052G Age-related osteoporosis with current pathological fracture, left femur, subsequent encounter for fracture with delayed healing: Secondary | ICD-10-CM | POA: Diagnosis not present

## 2015-01-31 DIAGNOSIS — R531 Weakness: Secondary | ICD-10-CM | POA: Diagnosis not present

## 2015-01-31 DIAGNOSIS — R5383 Other fatigue: Secondary | ICD-10-CM | POA: Diagnosis not present

## 2015-02-02 DIAGNOSIS — N390 Urinary tract infection, site not specified: Secondary | ICD-10-CM | POA: Diagnosis not present

## 2015-02-02 DIAGNOSIS — M1990 Unspecified osteoarthritis, unspecified site: Secondary | ICD-10-CM | POA: Diagnosis not present

## 2015-02-02 DIAGNOSIS — M80022S Age-related osteoporosis with current pathological fracture, left humerus, sequela: Secondary | ICD-10-CM | POA: Diagnosis not present

## 2015-02-02 DIAGNOSIS — M80052G Age-related osteoporosis with current pathological fracture, left femur, subsequent encounter for fracture with delayed healing: Secondary | ICD-10-CM | POA: Diagnosis not present

## 2015-02-02 DIAGNOSIS — M353 Polymyalgia rheumatica: Secondary | ICD-10-CM | POA: Diagnosis not present

## 2015-02-02 DIAGNOSIS — H3530 Unspecified macular degeneration: Secondary | ICD-10-CM | POA: Diagnosis not present

## 2015-02-03 DIAGNOSIS — N390 Urinary tract infection, site not specified: Secondary | ICD-10-CM | POA: Diagnosis not present

## 2015-02-03 DIAGNOSIS — M353 Polymyalgia rheumatica: Secondary | ICD-10-CM | POA: Diagnosis not present

## 2015-02-03 DIAGNOSIS — M80052G Age-related osteoporosis with current pathological fracture, left femur, subsequent encounter for fracture with delayed healing: Secondary | ICD-10-CM | POA: Diagnosis not present

## 2015-02-03 DIAGNOSIS — M80022S Age-related osteoporosis with current pathological fracture, left humerus, sequela: Secondary | ICD-10-CM | POA: Diagnosis not present

## 2015-02-03 DIAGNOSIS — H3530 Unspecified macular degeneration: Secondary | ICD-10-CM | POA: Diagnosis not present

## 2015-02-03 DIAGNOSIS — M1990 Unspecified osteoarthritis, unspecified site: Secondary | ICD-10-CM | POA: Diagnosis not present

## 2015-02-04 DIAGNOSIS — M80052G Age-related osteoporosis with current pathological fracture, left femur, subsequent encounter for fracture with delayed healing: Secondary | ICD-10-CM | POA: Diagnosis not present

## 2015-02-04 DIAGNOSIS — H3530 Unspecified macular degeneration: Secondary | ICD-10-CM | POA: Diagnosis not present

## 2015-02-04 DIAGNOSIS — M80022S Age-related osteoporosis with current pathological fracture, left humerus, sequela: Secondary | ICD-10-CM | POA: Diagnosis not present

## 2015-02-04 DIAGNOSIS — M1990 Unspecified osteoarthritis, unspecified site: Secondary | ICD-10-CM | POA: Diagnosis not present

## 2015-02-04 DIAGNOSIS — N390 Urinary tract infection, site not specified: Secondary | ICD-10-CM | POA: Diagnosis not present

## 2015-02-04 DIAGNOSIS — M353 Polymyalgia rheumatica: Secondary | ICD-10-CM | POA: Diagnosis not present

## 2015-02-07 DIAGNOSIS — M80052G Age-related osteoporosis with current pathological fracture, left femur, subsequent encounter for fracture with delayed healing: Secondary | ICD-10-CM | POA: Diagnosis not present

## 2015-02-07 DIAGNOSIS — H3530 Unspecified macular degeneration: Secondary | ICD-10-CM | POA: Diagnosis not present

## 2015-02-07 DIAGNOSIS — M1990 Unspecified osteoarthritis, unspecified site: Secondary | ICD-10-CM | POA: Diagnosis not present

## 2015-02-07 DIAGNOSIS — N390 Urinary tract infection, site not specified: Secondary | ICD-10-CM | POA: Diagnosis not present

## 2015-02-07 DIAGNOSIS — M353 Polymyalgia rheumatica: Secondary | ICD-10-CM | POA: Diagnosis not present

## 2015-02-07 DIAGNOSIS — M80022S Age-related osteoporosis with current pathological fracture, left humerus, sequela: Secondary | ICD-10-CM | POA: Diagnosis not present

## 2015-02-08 DIAGNOSIS — M80052G Age-related osteoporosis with current pathological fracture, left femur, subsequent encounter for fracture with delayed healing: Secondary | ICD-10-CM | POA: Diagnosis not present

## 2015-02-08 DIAGNOSIS — M80022S Age-related osteoporosis with current pathological fracture, left humerus, sequela: Secondary | ICD-10-CM | POA: Diagnosis not present

## 2015-02-08 DIAGNOSIS — M353 Polymyalgia rheumatica: Secondary | ICD-10-CM | POA: Diagnosis not present

## 2015-02-08 DIAGNOSIS — M1990 Unspecified osteoarthritis, unspecified site: Secondary | ICD-10-CM | POA: Diagnosis not present

## 2015-02-08 DIAGNOSIS — H3530 Unspecified macular degeneration: Secondary | ICD-10-CM | POA: Diagnosis not present

## 2015-02-08 DIAGNOSIS — N390 Urinary tract infection, site not specified: Secondary | ICD-10-CM | POA: Diagnosis not present

## 2015-02-09 DIAGNOSIS — H3530 Unspecified macular degeneration: Secondary | ICD-10-CM | POA: Diagnosis not present

## 2015-02-09 DIAGNOSIS — M80022S Age-related osteoporosis with current pathological fracture, left humerus, sequela: Secondary | ICD-10-CM | POA: Diagnosis not present

## 2015-02-09 DIAGNOSIS — M80052G Age-related osteoporosis with current pathological fracture, left femur, subsequent encounter for fracture with delayed healing: Secondary | ICD-10-CM | POA: Diagnosis not present

## 2015-02-09 DIAGNOSIS — M1990 Unspecified osteoarthritis, unspecified site: Secondary | ICD-10-CM | POA: Diagnosis not present

## 2015-02-09 DIAGNOSIS — N390 Urinary tract infection, site not specified: Secondary | ICD-10-CM | POA: Diagnosis not present

## 2015-02-09 DIAGNOSIS — M353 Polymyalgia rheumatica: Secondary | ICD-10-CM | POA: Diagnosis not present

## 2015-02-10 DIAGNOSIS — N390 Urinary tract infection, site not specified: Secondary | ICD-10-CM | POA: Diagnosis not present

## 2015-02-10 DIAGNOSIS — M1990 Unspecified osteoarthritis, unspecified site: Secondary | ICD-10-CM | POA: Diagnosis not present

## 2015-02-10 DIAGNOSIS — M353 Polymyalgia rheumatica: Secondary | ICD-10-CM | POA: Diagnosis not present

## 2015-02-10 DIAGNOSIS — M80022S Age-related osteoporosis with current pathological fracture, left humerus, sequela: Secondary | ICD-10-CM | POA: Diagnosis not present

## 2015-02-10 DIAGNOSIS — H3530 Unspecified macular degeneration: Secondary | ICD-10-CM | POA: Diagnosis not present

## 2015-02-10 DIAGNOSIS — M80052G Age-related osteoporosis with current pathological fracture, left femur, subsequent encounter for fracture with delayed healing: Secondary | ICD-10-CM | POA: Diagnosis not present

## 2015-02-14 DIAGNOSIS — M80052G Age-related osteoporosis with current pathological fracture, left femur, subsequent encounter for fracture with delayed healing: Secondary | ICD-10-CM | POA: Diagnosis not present

## 2015-02-14 DIAGNOSIS — M1990 Unspecified osteoarthritis, unspecified site: Secondary | ICD-10-CM | POA: Diagnosis not present

## 2015-02-14 DIAGNOSIS — N390 Urinary tract infection, site not specified: Secondary | ICD-10-CM | POA: Diagnosis not present

## 2015-02-14 DIAGNOSIS — M80022S Age-related osteoporosis with current pathological fracture, left humerus, sequela: Secondary | ICD-10-CM | POA: Diagnosis not present

## 2015-02-14 DIAGNOSIS — H3530 Unspecified macular degeneration: Secondary | ICD-10-CM | POA: Diagnosis not present

## 2015-02-14 DIAGNOSIS — M353 Polymyalgia rheumatica: Secondary | ICD-10-CM | POA: Diagnosis not present

## 2015-02-15 DIAGNOSIS — H3530 Unspecified macular degeneration: Secondary | ICD-10-CM | POA: Diagnosis not present

## 2015-02-15 DIAGNOSIS — M1990 Unspecified osteoarthritis, unspecified site: Secondary | ICD-10-CM | POA: Diagnosis not present

## 2015-02-15 DIAGNOSIS — N390 Urinary tract infection, site not specified: Secondary | ICD-10-CM | POA: Diagnosis not present

## 2015-02-15 DIAGNOSIS — M353 Polymyalgia rheumatica: Secondary | ICD-10-CM | POA: Diagnosis not present

## 2015-02-15 DIAGNOSIS — M80022S Age-related osteoporosis with current pathological fracture, left humerus, sequela: Secondary | ICD-10-CM | POA: Diagnosis not present

## 2015-02-15 DIAGNOSIS — M80052G Age-related osteoporosis with current pathological fracture, left femur, subsequent encounter for fracture with delayed healing: Secondary | ICD-10-CM | POA: Diagnosis not present

## 2015-02-17 DIAGNOSIS — H3530 Unspecified macular degeneration: Secondary | ICD-10-CM | POA: Diagnosis not present

## 2015-02-17 DIAGNOSIS — M353 Polymyalgia rheumatica: Secondary | ICD-10-CM | POA: Diagnosis not present

## 2015-02-17 DIAGNOSIS — M80052G Age-related osteoporosis with current pathological fracture, left femur, subsequent encounter for fracture with delayed healing: Secondary | ICD-10-CM | POA: Diagnosis not present

## 2015-02-17 DIAGNOSIS — M80022S Age-related osteoporosis with current pathological fracture, left humerus, sequela: Secondary | ICD-10-CM | POA: Diagnosis not present

## 2015-02-17 DIAGNOSIS — N390 Urinary tract infection, site not specified: Secondary | ICD-10-CM | POA: Diagnosis not present

## 2015-02-17 DIAGNOSIS — M1990 Unspecified osteoarthritis, unspecified site: Secondary | ICD-10-CM | POA: Diagnosis not present

## 2015-02-21 DIAGNOSIS — E039 Hypothyroidism, unspecified: Secondary | ICD-10-CM | POA: Diagnosis not present

## 2015-02-21 DIAGNOSIS — E871 Hypo-osmolality and hyponatremia: Secondary | ICD-10-CM | POA: Diagnosis not present

## 2015-02-21 DIAGNOSIS — M545 Low back pain: Secondary | ICD-10-CM | POA: Diagnosis not present

## 2015-02-22 DIAGNOSIS — M80022S Age-related osteoporosis with current pathological fracture, left humerus, sequela: Secondary | ICD-10-CM | POA: Diagnosis not present

## 2015-02-22 DIAGNOSIS — H3530 Unspecified macular degeneration: Secondary | ICD-10-CM | POA: Diagnosis not present

## 2015-02-22 DIAGNOSIS — N390 Urinary tract infection, site not specified: Secondary | ICD-10-CM | POA: Diagnosis not present

## 2015-02-22 DIAGNOSIS — M1990 Unspecified osteoarthritis, unspecified site: Secondary | ICD-10-CM | POA: Diagnosis not present

## 2015-02-22 DIAGNOSIS — M80052G Age-related osteoporosis with current pathological fracture, left femur, subsequent encounter for fracture with delayed healing: Secondary | ICD-10-CM | POA: Diagnosis not present

## 2015-02-22 DIAGNOSIS — M353 Polymyalgia rheumatica: Secondary | ICD-10-CM | POA: Diagnosis not present

## 2015-02-24 DIAGNOSIS — M80022S Age-related osteoporosis with current pathological fracture, left humerus, sequela: Secondary | ICD-10-CM | POA: Diagnosis not present

## 2015-02-24 DIAGNOSIS — M80052G Age-related osteoporosis with current pathological fracture, left femur, subsequent encounter for fracture with delayed healing: Secondary | ICD-10-CM | POA: Diagnosis not present

## 2015-02-24 DIAGNOSIS — M353 Polymyalgia rheumatica: Secondary | ICD-10-CM | POA: Diagnosis not present

## 2015-02-24 DIAGNOSIS — H3530 Unspecified macular degeneration: Secondary | ICD-10-CM | POA: Diagnosis not present

## 2015-02-24 DIAGNOSIS — M1990 Unspecified osteoarthritis, unspecified site: Secondary | ICD-10-CM | POA: Diagnosis not present

## 2015-02-24 DIAGNOSIS — N390 Urinary tract infection, site not specified: Secondary | ICD-10-CM | POA: Diagnosis not present

## 2015-02-25 DIAGNOSIS — M461 Sacroiliitis, not elsewhere classified: Secondary | ICD-10-CM | POA: Diagnosis not present

## 2015-02-25 DIAGNOSIS — M545 Low back pain: Secondary | ICD-10-CM | POA: Diagnosis not present

## 2015-03-01 ENCOUNTER — Ambulatory Visit (INDEPENDENT_AMBULATORY_CARE_PROVIDER_SITE_OTHER): Payer: Medicare Other | Admitting: *Deleted

## 2015-03-01 DIAGNOSIS — M80052G Age-related osteoporosis with current pathological fracture, left femur, subsequent encounter for fracture with delayed healing: Secondary | ICD-10-CM | POA: Diagnosis not present

## 2015-03-01 DIAGNOSIS — H3530 Unspecified macular degeneration: Secondary | ICD-10-CM | POA: Diagnosis not present

## 2015-03-01 DIAGNOSIS — R001 Bradycardia, unspecified: Secondary | ICD-10-CM

## 2015-03-01 DIAGNOSIS — N390 Urinary tract infection, site not specified: Secondary | ICD-10-CM | POA: Diagnosis not present

## 2015-03-01 DIAGNOSIS — M1990 Unspecified osteoarthritis, unspecified site: Secondary | ICD-10-CM | POA: Diagnosis not present

## 2015-03-01 DIAGNOSIS — M353 Polymyalgia rheumatica: Secondary | ICD-10-CM | POA: Diagnosis not present

## 2015-03-01 DIAGNOSIS — M80022S Age-related osteoporosis with current pathological fracture, left humerus, sequela: Secondary | ICD-10-CM | POA: Diagnosis not present

## 2015-03-01 NOTE — Progress Notes (Signed)
Remote pacemaker transmission.   

## 2015-03-03 DIAGNOSIS — M545 Low back pain: Secondary | ICD-10-CM | POA: Diagnosis not present

## 2015-03-03 DIAGNOSIS — N390 Urinary tract infection, site not specified: Secondary | ICD-10-CM | POA: Diagnosis not present

## 2015-03-03 DIAGNOSIS — M353 Polymyalgia rheumatica: Secondary | ICD-10-CM | POA: Diagnosis not present

## 2015-03-03 DIAGNOSIS — M1990 Unspecified osteoarthritis, unspecified site: Secondary | ICD-10-CM | POA: Diagnosis not present

## 2015-03-03 DIAGNOSIS — H3530 Unspecified macular degeneration: Secondary | ICD-10-CM | POA: Diagnosis not present

## 2015-03-03 DIAGNOSIS — M80052G Age-related osteoporosis with current pathological fracture, left femur, subsequent encounter for fracture with delayed healing: Secondary | ICD-10-CM | POA: Diagnosis not present

## 2015-03-03 DIAGNOSIS — M80022S Age-related osteoporosis with current pathological fracture, left humerus, sequela: Secondary | ICD-10-CM | POA: Diagnosis not present

## 2015-03-03 LAB — CUP PACEART REMOTE DEVICE CHECK
Battery Remaining Longevity: 106 mo
Battery Voltage: 2.95 V
Brady Statistic AS VP Percent: 1 %
Brady Statistic RA Percent Paced: 21 %
Brady Statistic RV Percent Paced: 1 %
Implantable Lead Implant Date: 20130214
Implantable Lead Location: 753859
Implantable Lead Location: 753860
Lead Channel Impedance Value: 390 Ohm
Lead Channel Impedance Value: 410 Ohm
Lead Channel Setting Pacing Amplitude: 2 V
Lead Channel Setting Pacing Amplitude: 2.5 V
MDC IDC LEAD IMPLANT DT: 20130214
MDC IDC MSMT BATTERY REMAINING PERCENTAGE: 91 %
MDC IDC MSMT LEADCHNL RA SENSING INTR AMPL: 4.7 mV
MDC IDC MSMT LEADCHNL RV SENSING INTR AMPL: 12 mV
MDC IDC SESS DTM: 20161108081742
MDC IDC SET LEADCHNL RV PACING PULSEWIDTH: 0.5 ms
MDC IDC SET LEADCHNL RV SENSING SENSITIVITY: 2 mV
MDC IDC STAT BRADY AP VP PERCENT: 1 %
MDC IDC STAT BRADY AP VS PERCENT: 22 %
MDC IDC STAT BRADY AS VS PERCENT: 78 %
Pulse Gen Model: 2210
Pulse Gen Serial Number: 7316836

## 2015-03-04 ENCOUNTER — Encounter: Payer: Self-pay | Admitting: Cardiology

## 2015-03-04 DIAGNOSIS — M80052G Age-related osteoporosis with current pathological fracture, left femur, subsequent encounter for fracture with delayed healing: Secondary | ICD-10-CM | POA: Diagnosis not present

## 2015-03-04 DIAGNOSIS — Z950 Presence of cardiac pacemaker: Secondary | ICD-10-CM | POA: Diagnosis not present

## 2015-03-04 DIAGNOSIS — M545 Low back pain: Secondary | ICD-10-CM | POA: Diagnosis not present

## 2015-03-04 DIAGNOSIS — M1990 Unspecified osteoarthritis, unspecified site: Secondary | ICD-10-CM | POA: Diagnosis not present

## 2015-03-04 DIAGNOSIS — M80022S Age-related osteoporosis with current pathological fracture, left humerus, sequela: Secondary | ICD-10-CM | POA: Diagnosis not present

## 2015-03-04 DIAGNOSIS — M353 Polymyalgia rheumatica: Secondary | ICD-10-CM | POA: Diagnosis not present

## 2015-03-04 DIAGNOSIS — Z9181 History of falling: Secondary | ICD-10-CM | POA: Diagnosis not present

## 2015-03-04 DIAGNOSIS — Z87310 Personal history of (healed) osteoporosis fracture: Secondary | ICD-10-CM | POA: Diagnosis not present

## 2015-03-04 DIAGNOSIS — I10 Essential (primary) hypertension: Secondary | ICD-10-CM | POA: Diagnosis not present

## 2015-03-04 DIAGNOSIS — H3530 Unspecified macular degeneration: Secondary | ICD-10-CM | POA: Diagnosis not present

## 2015-03-08 DIAGNOSIS — M545 Low back pain: Secondary | ICD-10-CM | POA: Diagnosis not present

## 2015-03-08 DIAGNOSIS — M1990 Unspecified osteoarthritis, unspecified site: Secondary | ICD-10-CM | POA: Diagnosis not present

## 2015-03-08 DIAGNOSIS — M80052G Age-related osteoporosis with current pathological fracture, left femur, subsequent encounter for fracture with delayed healing: Secondary | ICD-10-CM | POA: Diagnosis not present

## 2015-03-08 DIAGNOSIS — M353 Polymyalgia rheumatica: Secondary | ICD-10-CM | POA: Diagnosis not present

## 2015-03-08 DIAGNOSIS — M80022S Age-related osteoporosis with current pathological fracture, left humerus, sequela: Secondary | ICD-10-CM | POA: Diagnosis not present

## 2015-03-08 DIAGNOSIS — H3530 Unspecified macular degeneration: Secondary | ICD-10-CM | POA: Diagnosis not present

## 2015-03-10 DIAGNOSIS — M80022S Age-related osteoporosis with current pathological fracture, left humerus, sequela: Secondary | ICD-10-CM | POA: Diagnosis not present

## 2015-03-10 DIAGNOSIS — M545 Low back pain: Secondary | ICD-10-CM | POA: Diagnosis not present

## 2015-03-10 DIAGNOSIS — M80052G Age-related osteoporosis with current pathological fracture, left femur, subsequent encounter for fracture with delayed healing: Secondary | ICD-10-CM | POA: Diagnosis not present

## 2015-03-10 DIAGNOSIS — H3530 Unspecified macular degeneration: Secondary | ICD-10-CM | POA: Diagnosis not present

## 2015-03-10 DIAGNOSIS — M353 Polymyalgia rheumatica: Secondary | ICD-10-CM | POA: Diagnosis not present

## 2015-03-10 DIAGNOSIS — M1990 Unspecified osteoarthritis, unspecified site: Secondary | ICD-10-CM | POA: Diagnosis not present

## 2015-03-14 DIAGNOSIS — H3530 Unspecified macular degeneration: Secondary | ICD-10-CM | POA: Diagnosis not present

## 2015-03-14 DIAGNOSIS — M545 Low back pain: Secondary | ICD-10-CM | POA: Diagnosis not present

## 2015-03-14 DIAGNOSIS — M353 Polymyalgia rheumatica: Secondary | ICD-10-CM | POA: Diagnosis not present

## 2015-03-14 DIAGNOSIS — M80052G Age-related osteoporosis with current pathological fracture, left femur, subsequent encounter for fracture with delayed healing: Secondary | ICD-10-CM | POA: Diagnosis not present

## 2015-03-14 DIAGNOSIS — M1990 Unspecified osteoarthritis, unspecified site: Secondary | ICD-10-CM | POA: Diagnosis not present

## 2015-03-14 DIAGNOSIS — M80022S Age-related osteoporosis with current pathological fracture, left humerus, sequela: Secondary | ICD-10-CM | POA: Diagnosis not present

## 2015-03-15 DIAGNOSIS — H3530 Unspecified macular degeneration: Secondary | ICD-10-CM | POA: Diagnosis not present

## 2015-03-15 DIAGNOSIS — M353 Polymyalgia rheumatica: Secondary | ICD-10-CM | POA: Diagnosis not present

## 2015-03-15 DIAGNOSIS — M80052G Age-related osteoporosis with current pathological fracture, left femur, subsequent encounter for fracture with delayed healing: Secondary | ICD-10-CM | POA: Diagnosis not present

## 2015-03-15 DIAGNOSIS — M545 Low back pain: Secondary | ICD-10-CM | POA: Diagnosis not present

## 2015-03-15 DIAGNOSIS — M80022S Age-related osteoporosis with current pathological fracture, left humerus, sequela: Secondary | ICD-10-CM | POA: Diagnosis not present

## 2015-03-15 DIAGNOSIS — M1990 Unspecified osteoarthritis, unspecified site: Secondary | ICD-10-CM | POA: Diagnosis not present

## 2015-03-22 DIAGNOSIS — M1990 Unspecified osteoarthritis, unspecified site: Secondary | ICD-10-CM | POA: Diagnosis not present

## 2015-03-22 DIAGNOSIS — H3530 Unspecified macular degeneration: Secondary | ICD-10-CM | POA: Diagnosis not present

## 2015-03-22 DIAGNOSIS — M545 Low back pain: Secondary | ICD-10-CM | POA: Diagnosis not present

## 2015-03-22 DIAGNOSIS — M80052G Age-related osteoporosis with current pathological fracture, left femur, subsequent encounter for fracture with delayed healing: Secondary | ICD-10-CM | POA: Diagnosis not present

## 2015-03-22 DIAGNOSIS — M353 Polymyalgia rheumatica: Secondary | ICD-10-CM | POA: Diagnosis not present

## 2015-03-22 DIAGNOSIS — M80022S Age-related osteoporosis with current pathological fracture, left humerus, sequela: Secondary | ICD-10-CM | POA: Diagnosis not present

## 2015-03-24 DIAGNOSIS — M1990 Unspecified osteoarthritis, unspecified site: Secondary | ICD-10-CM | POA: Diagnosis not present

## 2015-03-24 DIAGNOSIS — M353 Polymyalgia rheumatica: Secondary | ICD-10-CM | POA: Diagnosis not present

## 2015-03-24 DIAGNOSIS — H3530 Unspecified macular degeneration: Secondary | ICD-10-CM | POA: Diagnosis not present

## 2015-03-24 DIAGNOSIS — M80022S Age-related osteoporosis with current pathological fracture, left humerus, sequela: Secondary | ICD-10-CM | POA: Diagnosis not present

## 2015-03-24 DIAGNOSIS — M545 Low back pain: Secondary | ICD-10-CM | POA: Diagnosis not present

## 2015-03-24 DIAGNOSIS — M80052G Age-related osteoporosis with current pathological fracture, left femur, subsequent encounter for fracture with delayed healing: Secondary | ICD-10-CM | POA: Diagnosis not present

## 2015-03-28 DIAGNOSIS — H3530 Unspecified macular degeneration: Secondary | ICD-10-CM | POA: Diagnosis not present

## 2015-03-28 DIAGNOSIS — M545 Low back pain: Secondary | ICD-10-CM | POA: Diagnosis not present

## 2015-03-28 DIAGNOSIS — M353 Polymyalgia rheumatica: Secondary | ICD-10-CM | POA: Diagnosis not present

## 2015-03-28 DIAGNOSIS — M1990 Unspecified osteoarthritis, unspecified site: Secondary | ICD-10-CM | POA: Diagnosis not present

## 2015-03-28 DIAGNOSIS — M80022S Age-related osteoporosis with current pathological fracture, left humerus, sequela: Secondary | ICD-10-CM | POA: Diagnosis not present

## 2015-03-28 DIAGNOSIS — M80052G Age-related osteoporosis with current pathological fracture, left femur, subsequent encounter for fracture with delayed healing: Secondary | ICD-10-CM | POA: Diagnosis not present

## 2015-03-30 DIAGNOSIS — M353 Polymyalgia rheumatica: Secondary | ICD-10-CM | POA: Diagnosis not present

## 2015-03-30 DIAGNOSIS — M80022S Age-related osteoporosis with current pathological fracture, left humerus, sequela: Secondary | ICD-10-CM | POA: Diagnosis not present

## 2015-03-30 DIAGNOSIS — M1990 Unspecified osteoarthritis, unspecified site: Secondary | ICD-10-CM | POA: Diagnosis not present

## 2015-03-30 DIAGNOSIS — M545 Low back pain: Secondary | ICD-10-CM | POA: Diagnosis not present

## 2015-03-30 DIAGNOSIS — M80052G Age-related osteoporosis with current pathological fracture, left femur, subsequent encounter for fracture with delayed healing: Secondary | ICD-10-CM | POA: Diagnosis not present

## 2015-03-30 DIAGNOSIS — H3530 Unspecified macular degeneration: Secondary | ICD-10-CM | POA: Diagnosis not present

## 2015-04-04 DIAGNOSIS — M1990 Unspecified osteoarthritis, unspecified site: Secondary | ICD-10-CM | POA: Diagnosis not present

## 2015-04-04 DIAGNOSIS — M545 Low back pain: Secondary | ICD-10-CM | POA: Diagnosis not present

## 2015-04-04 DIAGNOSIS — M353 Polymyalgia rheumatica: Secondary | ICD-10-CM | POA: Diagnosis not present

## 2015-04-04 DIAGNOSIS — H3530 Unspecified macular degeneration: Secondary | ICD-10-CM | POA: Diagnosis not present

## 2015-04-04 DIAGNOSIS — M80022S Age-related osteoporosis with current pathological fracture, left humerus, sequela: Secondary | ICD-10-CM | POA: Diagnosis not present

## 2015-04-04 DIAGNOSIS — M80052G Age-related osteoporosis with current pathological fracture, left femur, subsequent encounter for fracture with delayed healing: Secondary | ICD-10-CM | POA: Diagnosis not present

## 2015-04-06 DIAGNOSIS — H04123 Dry eye syndrome of bilateral lacrimal glands: Secondary | ICD-10-CM | POA: Diagnosis not present

## 2015-04-07 DIAGNOSIS — M80052G Age-related osteoporosis with current pathological fracture, left femur, subsequent encounter for fracture with delayed healing: Secondary | ICD-10-CM | POA: Diagnosis not present

## 2015-04-07 DIAGNOSIS — H3530 Unspecified macular degeneration: Secondary | ICD-10-CM | POA: Diagnosis not present

## 2015-04-07 DIAGNOSIS — M545 Low back pain: Secondary | ICD-10-CM | POA: Diagnosis not present

## 2015-04-07 DIAGNOSIS — M1990 Unspecified osteoarthritis, unspecified site: Secondary | ICD-10-CM | POA: Diagnosis not present

## 2015-04-07 DIAGNOSIS — M80022S Age-related osteoporosis with current pathological fracture, left humerus, sequela: Secondary | ICD-10-CM | POA: Diagnosis not present

## 2015-04-07 DIAGNOSIS — M353 Polymyalgia rheumatica: Secondary | ICD-10-CM | POA: Diagnosis not present

## 2015-04-11 DIAGNOSIS — H3530 Unspecified macular degeneration: Secondary | ICD-10-CM | POA: Diagnosis not present

## 2015-04-11 DIAGNOSIS — S335XXA Sprain of ligaments of lumbar spine, initial encounter: Secondary | ICD-10-CM | POA: Diagnosis not present

## 2015-04-11 DIAGNOSIS — M353 Polymyalgia rheumatica: Secondary | ICD-10-CM | POA: Diagnosis not present

## 2015-04-11 DIAGNOSIS — M80052G Age-related osteoporosis with current pathological fracture, left femur, subsequent encounter for fracture with delayed healing: Secondary | ICD-10-CM | POA: Diagnosis not present

## 2015-04-11 DIAGNOSIS — M80022S Age-related osteoporosis with current pathological fracture, left humerus, sequela: Secondary | ICD-10-CM | POA: Diagnosis not present

## 2015-04-11 DIAGNOSIS — M545 Low back pain: Secondary | ICD-10-CM | POA: Diagnosis not present

## 2015-04-11 DIAGNOSIS — M1990 Unspecified osteoarthritis, unspecified site: Secondary | ICD-10-CM | POA: Diagnosis not present

## 2015-04-14 DIAGNOSIS — M545 Low back pain: Secondary | ICD-10-CM | POA: Diagnosis not present

## 2015-04-14 DIAGNOSIS — M353 Polymyalgia rheumatica: Secondary | ICD-10-CM | POA: Diagnosis not present

## 2015-04-14 DIAGNOSIS — M80052G Age-related osteoporosis with current pathological fracture, left femur, subsequent encounter for fracture with delayed healing: Secondary | ICD-10-CM | POA: Diagnosis not present

## 2015-04-14 DIAGNOSIS — H3530 Unspecified macular degeneration: Secondary | ICD-10-CM | POA: Diagnosis not present

## 2015-04-14 DIAGNOSIS — M1990 Unspecified osteoarthritis, unspecified site: Secondary | ICD-10-CM | POA: Diagnosis not present

## 2015-04-14 DIAGNOSIS — M80022S Age-related osteoporosis with current pathological fracture, left humerus, sequela: Secondary | ICD-10-CM | POA: Diagnosis not present

## 2015-04-26 DIAGNOSIS — H6502 Acute serous otitis media, left ear: Secondary | ICD-10-CM | POA: Diagnosis not present

## 2015-04-26 DIAGNOSIS — J019 Acute sinusitis, unspecified: Secondary | ICD-10-CM | POA: Diagnosis not present

## 2015-04-26 DIAGNOSIS — N390 Urinary tract infection, site not specified: Secondary | ICD-10-CM | POA: Diagnosis not present

## 2015-05-03 ENCOUNTER — Encounter: Payer: Self-pay | Admitting: Internal Medicine

## 2015-05-03 ENCOUNTER — Telehealth: Payer: Self-pay | Admitting: Internal Medicine

## 2015-05-03 DIAGNOSIS — N390 Urinary tract infection, site not specified: Secondary | ICD-10-CM | POA: Diagnosis not present

## 2015-05-03 DIAGNOSIS — R531 Weakness: Secondary | ICD-10-CM | POA: Diagnosis not present

## 2015-05-03 DIAGNOSIS — F5101 Primary insomnia: Secondary | ICD-10-CM | POA: Diagnosis not present

## 2015-05-03 NOTE — Telephone Encounter (Signed)
Informed patient's son that Dr.Taylor wants pt to f/u w/ PCP. Son stated that he was in PCP's ofc now. Encouraged son to call back with any further issues. Son voiced understanding.

## 2015-05-03 NOTE — Telephone Encounter (Signed)
New problem   Pt has irregular heartbeat according to staff at [facility] per pt's son calling, want a call back from nurse to advise next step to take.

## 2015-05-03 NOTE — Telephone Encounter (Signed)
Pt son called and stated has not been very nervous for the last several days. Pt son stated that the staff at [facility] called and informed him that they believe her heart is out of rhythm. Instructed pt son to send a remote transmission and that I would get someone to look at it once we receive it. Pt son verbalized understanding and stated to call him at [phone].

## 2015-05-05 NOTE — Progress Notes (Signed)
This encounter was created in error - please disregard.

## 2015-05-06 DIAGNOSIS — E871 Hypo-osmolality and hyponatremia: Secondary | ICD-10-CM | POA: Diagnosis not present

## 2015-05-06 DIAGNOSIS — D509 Iron deficiency anemia, unspecified: Secondary | ICD-10-CM | POA: Diagnosis not present

## 2015-05-06 DIAGNOSIS — E039 Hypothyroidism, unspecified: Secondary | ICD-10-CM | POA: Diagnosis not present

## 2015-05-10 DIAGNOSIS — M545 Low back pain: Secondary | ICD-10-CM | POA: Diagnosis not present

## 2015-05-10 DIAGNOSIS — I482 Chronic atrial fibrillation: Secondary | ICD-10-CM | POA: Diagnosis not present

## 2015-05-10 DIAGNOSIS — F5101 Primary insomnia: Secondary | ICD-10-CM | POA: Diagnosis not present

## 2015-05-10 DIAGNOSIS — R531 Weakness: Secondary | ICD-10-CM | POA: Diagnosis not present

## 2015-05-10 DIAGNOSIS — D509 Iron deficiency anemia, unspecified: Secondary | ICD-10-CM | POA: Diagnosis not present

## 2015-05-10 DIAGNOSIS — E039 Hypothyroidism, unspecified: Secondary | ICD-10-CM | POA: Diagnosis not present

## 2015-05-10 DIAGNOSIS — I499 Cardiac arrhythmia, unspecified: Secondary | ICD-10-CM | POA: Diagnosis not present

## 2015-05-10 DIAGNOSIS — M353 Polymyalgia rheumatica: Secondary | ICD-10-CM | POA: Diagnosis not present

## 2015-05-12 DIAGNOSIS — H3122 Choroidal dystrophy (central areolar) (generalized) (peripapillary): Secondary | ICD-10-CM | POA: Diagnosis not present

## 2015-05-16 DIAGNOSIS — M9903 Segmental and somatic dysfunction of lumbar region: Secondary | ICD-10-CM | POA: Diagnosis not present

## 2015-05-16 DIAGNOSIS — M9902 Segmental and somatic dysfunction of thoracic region: Secondary | ICD-10-CM | POA: Diagnosis not present

## 2015-05-16 DIAGNOSIS — M545 Low back pain: Secondary | ICD-10-CM | POA: Diagnosis not present

## 2015-05-16 DIAGNOSIS — M9905 Segmental and somatic dysfunction of pelvic region: Secondary | ICD-10-CM | POA: Diagnosis not present

## 2015-05-19 DIAGNOSIS — M9903 Segmental and somatic dysfunction of lumbar region: Secondary | ICD-10-CM | POA: Diagnosis not present

## 2015-05-19 DIAGNOSIS — M9902 Segmental and somatic dysfunction of thoracic region: Secondary | ICD-10-CM | POA: Diagnosis not present

## 2015-05-19 DIAGNOSIS — M545 Low back pain: Secondary | ICD-10-CM | POA: Diagnosis not present

## 2015-05-19 DIAGNOSIS — M9905 Segmental and somatic dysfunction of pelvic region: Secondary | ICD-10-CM | POA: Diagnosis not present

## 2015-05-23 DIAGNOSIS — H353212 Exudative age-related macular degeneration, right eye, with inactive choroidal neovascularization: Secondary | ICD-10-CM | POA: Diagnosis not present

## 2015-05-23 DIAGNOSIS — H35363 Drusen (degenerative) of macula, bilateral: Secondary | ICD-10-CM | POA: Diagnosis not present

## 2015-05-23 DIAGNOSIS — H353134 Nonexudative age-related macular degeneration, bilateral, advanced atrophic with subfoveal involvement: Secondary | ICD-10-CM | POA: Diagnosis not present

## 2015-05-23 DIAGNOSIS — H353221 Exudative age-related macular degeneration, left eye, with active choroidal neovascularization: Secondary | ICD-10-CM | POA: Diagnosis not present

## 2015-05-23 DIAGNOSIS — H35352 Cystoid macular degeneration, left eye: Secondary | ICD-10-CM | POA: Diagnosis not present

## 2015-05-23 DIAGNOSIS — H35351 Cystoid macular degeneration, right eye: Secondary | ICD-10-CM | POA: Diagnosis not present

## 2015-05-25 DIAGNOSIS — M9903 Segmental and somatic dysfunction of lumbar region: Secondary | ICD-10-CM | POA: Diagnosis not present

## 2015-05-25 DIAGNOSIS — M545 Low back pain: Secondary | ICD-10-CM | POA: Diagnosis not present

## 2015-05-25 DIAGNOSIS — M9902 Segmental and somatic dysfunction of thoracic region: Secondary | ICD-10-CM | POA: Diagnosis not present

## 2015-05-25 DIAGNOSIS — M9905 Segmental and somatic dysfunction of pelvic region: Secondary | ICD-10-CM | POA: Diagnosis not present

## 2015-05-27 DIAGNOSIS — M9905 Segmental and somatic dysfunction of pelvic region: Secondary | ICD-10-CM | POA: Diagnosis not present

## 2015-05-27 DIAGNOSIS — M9903 Segmental and somatic dysfunction of lumbar region: Secondary | ICD-10-CM | POA: Diagnosis not present

## 2015-05-27 DIAGNOSIS — M9902 Segmental and somatic dysfunction of thoracic region: Secondary | ICD-10-CM | POA: Diagnosis not present

## 2015-05-27 DIAGNOSIS — M545 Low back pain: Secondary | ICD-10-CM | POA: Diagnosis not present

## 2015-05-31 ENCOUNTER — Ambulatory Visit (INDEPENDENT_AMBULATORY_CARE_PROVIDER_SITE_OTHER): Payer: Medicare Other | Admitting: *Deleted

## 2015-05-31 DIAGNOSIS — R001 Bradycardia, unspecified: Secondary | ICD-10-CM

## 2015-05-31 DIAGNOSIS — M545 Low back pain: Secondary | ICD-10-CM | POA: Diagnosis not present

## 2015-05-31 DIAGNOSIS — M9905 Segmental and somatic dysfunction of pelvic region: Secondary | ICD-10-CM | POA: Diagnosis not present

## 2015-05-31 DIAGNOSIS — M9902 Segmental and somatic dysfunction of thoracic region: Secondary | ICD-10-CM | POA: Diagnosis not present

## 2015-05-31 DIAGNOSIS — M9903 Segmental and somatic dysfunction of lumbar region: Secondary | ICD-10-CM | POA: Diagnosis not present

## 2015-05-31 NOTE — Progress Notes (Signed)
Remote pacemaker transmission.   

## 2015-06-03 DIAGNOSIS — M9903 Segmental and somatic dysfunction of lumbar region: Secondary | ICD-10-CM | POA: Diagnosis not present

## 2015-06-03 DIAGNOSIS — M9902 Segmental and somatic dysfunction of thoracic region: Secondary | ICD-10-CM | POA: Diagnosis not present

## 2015-06-03 DIAGNOSIS — M545 Low back pain: Secondary | ICD-10-CM | POA: Diagnosis not present

## 2015-06-03 DIAGNOSIS — M9905 Segmental and somatic dysfunction of pelvic region: Secondary | ICD-10-CM | POA: Diagnosis not present

## 2015-06-06 DIAGNOSIS — M9905 Segmental and somatic dysfunction of pelvic region: Secondary | ICD-10-CM | POA: Diagnosis not present

## 2015-06-06 DIAGNOSIS — M9903 Segmental and somatic dysfunction of lumbar region: Secondary | ICD-10-CM | POA: Diagnosis not present

## 2015-06-06 DIAGNOSIS — M9902 Segmental and somatic dysfunction of thoracic region: Secondary | ICD-10-CM | POA: Diagnosis not present

## 2015-06-06 DIAGNOSIS — M545 Low back pain: Secondary | ICD-10-CM | POA: Diagnosis not present

## 2015-06-07 DIAGNOSIS — R5383 Other fatigue: Secondary | ICD-10-CM | POA: Diagnosis not present

## 2015-06-07 DIAGNOSIS — M545 Low back pain: Secondary | ICD-10-CM | POA: Diagnosis not present

## 2015-06-07 DIAGNOSIS — F5101 Primary insomnia: Secondary | ICD-10-CM | POA: Diagnosis not present

## 2015-06-07 DIAGNOSIS — M353 Polymyalgia rheumatica: Secondary | ICD-10-CM | POA: Diagnosis not present

## 2015-06-07 DIAGNOSIS — R531 Weakness: Secondary | ICD-10-CM | POA: Diagnosis not present

## 2015-06-07 DIAGNOSIS — F339 Major depressive disorder, recurrent, unspecified: Secondary | ICD-10-CM | POA: Diagnosis not present

## 2015-06-10 DIAGNOSIS — M9903 Segmental and somatic dysfunction of lumbar region: Secondary | ICD-10-CM | POA: Diagnosis not present

## 2015-06-10 DIAGNOSIS — M9905 Segmental and somatic dysfunction of pelvic region: Secondary | ICD-10-CM | POA: Diagnosis not present

## 2015-06-10 DIAGNOSIS — M545 Low back pain: Secondary | ICD-10-CM | POA: Diagnosis not present

## 2015-06-10 DIAGNOSIS — M9902 Segmental and somatic dysfunction of thoracic region: Secondary | ICD-10-CM | POA: Diagnosis not present

## 2015-06-13 DIAGNOSIS — J00 Acute nasopharyngitis [common cold]: Secondary | ICD-10-CM | POA: Diagnosis not present

## 2015-06-13 DIAGNOSIS — J019 Acute sinusitis, unspecified: Secondary | ICD-10-CM | POA: Diagnosis not present

## 2015-06-13 DIAGNOSIS — H6120 Impacted cerumen, unspecified ear: Secondary | ICD-10-CM | POA: Diagnosis not present

## 2015-06-17 DIAGNOSIS — M9905 Segmental and somatic dysfunction of pelvic region: Secondary | ICD-10-CM | POA: Diagnosis not present

## 2015-06-17 DIAGNOSIS — M9903 Segmental and somatic dysfunction of lumbar region: Secondary | ICD-10-CM | POA: Diagnosis not present

## 2015-06-17 DIAGNOSIS — M9902 Segmental and somatic dysfunction of thoracic region: Secondary | ICD-10-CM | POA: Diagnosis not present

## 2015-06-17 DIAGNOSIS — M545 Low back pain: Secondary | ICD-10-CM | POA: Diagnosis not present

## 2015-06-23 DIAGNOSIS — R10819 Abdominal tenderness, unspecified site: Secondary | ICD-10-CM | POA: Diagnosis not present

## 2015-06-23 DIAGNOSIS — R110 Nausea: Secondary | ICD-10-CM | POA: Diagnosis not present

## 2015-06-23 DIAGNOSIS — J019 Acute sinusitis, unspecified: Secondary | ICD-10-CM | POA: Diagnosis not present

## 2015-06-25 ENCOUNTER — Emergency Department (HOSPITAL_COMMUNITY): Payer: Medicare Other

## 2015-06-25 ENCOUNTER — Emergency Department (HOSPITAL_COMMUNITY)
Admission: EM | Admit: 2015-06-25 | Discharge: 2015-06-25 | Disposition: A | Discharge status: Home / Self Care | Payer: Medicare Other | Attending: Emergency Medicine | Admitting: Emergency Medicine

## 2015-06-25 ENCOUNTER — Encounter (HOSPITAL_COMMUNITY): Payer: Self-pay | Admitting: Emergency Medicine

## 2015-06-25 DIAGNOSIS — D649 Anemia, unspecified: Secondary | ICD-10-CM | POA: Diagnosis not present

## 2015-06-25 DIAGNOSIS — S29011A Strain of muscle and tendon of front wall of thorax, initial encounter: Secondary | ICD-10-CM | POA: Diagnosis not present

## 2015-06-25 DIAGNOSIS — J069 Acute upper respiratory infection, unspecified: Secondary | ICD-10-CM | POA: Insufficient documentation

## 2015-06-25 DIAGNOSIS — S29012A Strain of muscle and tendon of back wall of thorax, initial encounter: Secondary | ICD-10-CM | POA: Diagnosis not present

## 2015-06-25 DIAGNOSIS — Z8669 Personal history of other diseases of the nervous system and sense organs: Secondary | ICD-10-CM | POA: Diagnosis not present

## 2015-06-25 DIAGNOSIS — K5900 Constipation, unspecified: Secondary | ICD-10-CM | POA: Diagnosis not present

## 2015-06-25 DIAGNOSIS — X58XXXA Exposure to other specified factors, initial encounter: Secondary | ICD-10-CM | POA: Diagnosis not present

## 2015-06-25 DIAGNOSIS — Z7982 Long term (current) use of aspirin: Secondary | ICD-10-CM | POA: Insufficient documentation

## 2015-06-25 DIAGNOSIS — Y9289 Other specified places as the place of occurrence of the external cause: Secondary | ICD-10-CM | POA: Diagnosis not present

## 2015-06-25 DIAGNOSIS — M1990 Unspecified osteoarthritis, unspecified site: Secondary | ICD-10-CM | POA: Diagnosis not present

## 2015-06-25 DIAGNOSIS — R05 Cough: Secondary | ICD-10-CM | POA: Diagnosis present

## 2015-06-25 DIAGNOSIS — M810 Age-related osteoporosis without current pathological fracture: Secondary | ICD-10-CM | POA: Diagnosis not present

## 2015-06-25 DIAGNOSIS — Y9389 Activity, other specified: Secondary | ICD-10-CM | POA: Diagnosis not present

## 2015-06-25 DIAGNOSIS — K219 Gastro-esophageal reflux disease without esophagitis: Secondary | ICD-10-CM | POA: Diagnosis not present

## 2015-06-25 DIAGNOSIS — Z950 Presence of cardiac pacemaker: Secondary | ICD-10-CM | POA: Diagnosis not present

## 2015-06-25 DIAGNOSIS — Y998 Other external cause status: Secondary | ICD-10-CM | POA: Diagnosis not present

## 2015-06-25 DIAGNOSIS — R531 Weakness: Secondary | ICD-10-CM | POA: Diagnosis not present

## 2015-06-25 DIAGNOSIS — Z7951 Long term (current) use of inhaled steroids: Secondary | ICD-10-CM | POA: Insufficient documentation

## 2015-06-25 DIAGNOSIS — Z79899 Other long term (current) drug therapy: Secondary | ICD-10-CM | POA: Diagnosis not present

## 2015-06-25 DIAGNOSIS — R404 Transient alteration of awareness: Secondary | ICD-10-CM | POA: Diagnosis not present

## 2015-06-25 DIAGNOSIS — E039 Hypothyroidism, unspecified: Secondary | ICD-10-CM | POA: Insufficient documentation

## 2015-06-25 DIAGNOSIS — Z7952 Long term (current) use of systemic steroids: Secondary | ICD-10-CM | POA: Insufficient documentation

## 2015-06-25 DIAGNOSIS — M546 Pain in thoracic spine: Secondary | ICD-10-CM | POA: Diagnosis not present

## 2015-06-25 DIAGNOSIS — M549 Dorsalgia, unspecified: Secondary | ICD-10-CM | POA: Diagnosis not present

## 2015-06-25 DIAGNOSIS — I10 Essential (primary) hypertension: Secondary | ICD-10-CM | POA: Insufficient documentation

## 2015-06-25 LAB — COMPREHENSIVE METABOLIC PANEL
ALT: 16 U/L (ref 14–54)
ANION GAP: 11 (ref 5–15)
AST: 35 U/L (ref 15–41)
Albumin: 4.8 g/dL (ref 3.5–5.0)
Alkaline Phosphatase: 69 U/L (ref 38–126)
BUN: 13 mg/dL (ref 6–20)
CALCIUM: 10.1 mg/dL (ref 8.9–10.3)
CO2: 27 mmol/L (ref 22–32)
CREATININE: 0.84 mg/dL (ref 0.44–1.00)
Chloride: 98 mmol/L — ABNORMAL LOW (ref 101–111)
GFR, EST NON AFRICAN AMERICAN: 59 mL/min — AB (ref >60)
Glucose, Bld: 103 mg/dL — ABNORMAL HIGH (ref 65–99)
Potassium: 4.5 mmol/L (ref 3.5–5.1)
SODIUM: 136 mmol/L (ref 135–145)
Total Bilirubin: 0.9 mg/dL (ref 0.3–1.2)
Total Protein: 7.1 g/dL (ref 6.5–8.1)

## 2015-06-25 LAB — CBC
HCT: 36.1 % (ref 36.0–46.0)
Hemoglobin: 12.3 g/dL (ref 12.0–15.0)
MCH: 33.7 pg (ref 26.0–34.0)
MCHC: 34.1 g/dL (ref 30.0–36.0)
MCV: 98.9 fL (ref 78.0–100.0)
PLATELETS: 385 10*3/uL (ref 150–400)
RBC: 3.65 MIL/uL — AB (ref 3.87–5.11)
RDW: 15.3 % (ref 11.5–15.5)
WBC: 11 10*3/uL — ABNORMAL HIGH (ref 4.0–10.5)

## 2015-06-25 LAB — TROPONIN I: Troponin I: <0.03 ng/mL (ref <0.031)

## 2015-06-25 LAB — PROTIME-INR
INR: 1.02 (ref 0.00–1.49)
Prothrombin Time: 13.6 seconds (ref 11.6–15.2)

## 2015-06-25 MED ORDER — OXYCODONE HCL 10 MG PO TABS: 10.0000 mg | ORAL_TABLET | Freq: Four times a day (QID) | ORAL | Status: DC | PRN

## 2015-06-25 MED ORDER — MORPHINE SULFATE (PF) 4 MG/ML IV SOLN
4.0000 mg | Freq: Once | INTRAVENOUS | Status: AC
  Administered 2015-06-25: 4 mg via INTRAVENOUS
  Filled 2015-06-25: qty 1

## 2015-06-25 NOTE — ED Notes (Signed)
She comes to [place] from [facility] in [place], [place] with c/o "coughing all week and now my back's starting to hurt".  She is in no distress.

## 2015-06-25 NOTE — ED Notes (Signed)
UNABLE TO COLLECT LABS PATIENT IS NOT IN THE ROOM 

## 2015-06-25 NOTE — Discharge Instructions (Signed)

## 2015-06-25 NOTE — ED Provider Notes (Signed)
CSN: [id number]     Arrival date & time 06/25/15  1129 History   First MD Initiated Contact with Patient 06/25/15 1303       HPI Pt has had uri sx with rhinitis, coughing, sneezing the past week.  Pt was coughing yesterday.  She noticed a pain develop acutely in her left mid back.  This morning the pain is more severe.  The pain moves across the entire back.  The pain increases with breathing and coughing.  She has tried hydrocodone without much relief.  Past Medical History  Diagnosis Date  . Hypertension   . Hypothyroidism   . Bradycardia   . Anemia   . Diverticulosis   . External hemorrhoids   . Constipation   . Macular degeneration   . Seasonal allergies   . Osteoporosis   . Degenerative joint disease   . Palpitations   . Hyperlipidemia   . GERD (gastroesophageal reflux disease)   . Pacemaker   . Thyrotoxicosis without mention of goiter or other cause, without mention of thyrotoxic crisis or storm   . Goiter, unspecified    Past Surgical History  Procedure Laterality Date  . Thyroidectomy    . Tonsillectomy    . Colonoscopy  November 2011    External hemorrhoids, diverticulosis, anal papillae  . Insert / replace / remove pacemaker    . Vertebroplasty    . Femur im nail Left 12/29/2012    Procedure: INTRAMEDULLARY (IM) NAIL INTERTROCHANTRIC;  Surgeon: Melina Schools, MD;  Location: [place];  Service: Orthopedics;  Laterality: Left;  . Permanent pacemaker insertion N/A 06/07/2011    Procedure: PERMANENT PACEMAKER INSERTION;  Surgeon: Evans Lance, MD;  Location: [facility] CATH LAB;  Service: Cardiovascular;  Laterality: N/A;  . Colonoscopy N/A 04/01/2014    Procedure: COLONOSCOPY;  Surgeon: Rogene Houston, MD;  [facility]: [facility];  Service: Endoscopy;  Laterality: N/A;  930   Family History  Problem Relation Age of Onset  . Heart attack Mother     Deceased  . Cancer Father     Deceased, colon cancer age 22  . Cancer Brother     Deceased, throat and lung   Social History    Substance Use Topics  . Smoking status: Never Smoker   . Smokeless tobacco: Never Used  . Alcohol Use: No   OB History    No data available     Review of Systems  Constitutional: Negative for fever.  Cardiovascular: Positive for chest pain.  Gastrointestinal: Negative for vomiting, abdominal pain and diarrhea.  Genitourinary: Negative for dysuria.  All other systems reviewed and are negative.     Allergies  Tramadol and Sulfa antibiotics  Home Medications   Prior to Admission medications   Medication Sig Start Date End Date Taking? Authorizing Provider  acetaminophen (TYLENOL) 500 MG tablet Take 1,000 mg by mouth every 8 (eight) hours as needed for mild pain.   Yes Historical Provider, MD  ALPRAZolam Duanne Moron) 0.25 MG tablet Take 0.25 mg by mouth at bedtime as needed for sleep.   Yes Historical Provider, MD  amLODipine (NORVASC) 5 MG tablet Take 5 mg by mouth at bedtime.  08/11/14  Yes Historical Provider, MD  amoxicillin-clavulanate (AUGMENTIN) 500-125 MG tablet Take 1 tablet by mouth 2 (two) times daily. Started 03/03 for 10 days   Yes Historical Provider, MD  aspirin 325 MG tablet Take 325 mg by mouth daily.   Yes Historical Provider, MD  bisacodyl (FLEET) 10 MG/30ML ENEM Place  10 mg rectally every other day as needed (for bowel movement).   Yes Historical Provider, MD  Calcium Carbonate-Vitamin D (CALCIUM 600+D) 600-400 MG-UNIT tablet Take 1 tablet by mouth 2 (two) times daily.   Yes Historical Provider, MD  carbamide peroxide (DEBROX) 6.5 % otic solution Place 5 drops into both ears daily.   Yes Historical Provider, MD  cycloSPORINE (RESTASIS) 0.05 % ophthalmic emulsion Place 1 drop into both eyes 2 (two) times daily. *Self Administration*   Yes Historical Provider, MD  Dextromethorphan-Guaifenesin (MUCINEX DM MAXIMUM STRENGTH) 60-1200 MG TB12 Take 1 tablet by mouth 2 (two) times daily. Started 03/03 for 1 week   Yes Historical Provider, MD  docusate sodium (COLACE) 100 MG  capsule Take 100 mg by mouth at bedtime. For constipation 01/01/13  Yes Ripudeep Krystal Eaton, MD  fluticasone (FLONASE) 50 MCG/ACT nasal spray Place 2 sprays into the nose daily. For allergies   Yes Historical Provider, MD  hydrALAZINE (APRESOLINE) 25 MG tablet Take 25 mg by mouth 2 (two) times daily.    Yes Historical Provider, MD  hydrocortisone (ANUSOL-HC) 2.5 % rectal cream Place rectally 3 (three) times daily. Patient taking differently: Place 1 application rectally 3 (three) times daily.  12/06/14  Yes Maryann Mikhail, DO  Hypertonic Nasal Wash (SINUS RINSE NA) Place 1 application into both nostrils daily.   Yes Historical Provider, MD  Iron-FA-B Cmp-C-Biot-Probiotic (FUSION PLUS PO) Take 1 capsule by mouth daily.   Yes Historical Provider, MD  levothyroxine (SYNTHROID, LEVOTHROID) 100 MCG tablet Take 100 mcg by mouth daily before breakfast.   Yes Historical Provider, MD  lisinopril (PRINIVIL,ZESTRIL) 20 MG tablet Take 20 mg by mouth daily.    Yes Historical Provider, MD  loperamide (IMODIUM) 2 MG capsule Take 2 mg by mouth every 4 (four) hours as needed for diarrhea or loose stools.   Yes Historical Provider, MD  loratadine (CLARITIN) 10 MG tablet Take 10 mg by mouth daily.   Yes Historical Provider, MD  Magnesium 250 MG TABS Take 1 tablet by mouth at bedtime.    Yes Historical Provider, MD  magnesium hydroxide (MILK OF MAGNESIA) 400 MG/5ML suspension Take 0.5 mLs by mouth as needed for mild constipation.   Yes Historical Provider, MD  Melatonin 1 MG TABS Take 1 mg by mouth at bedtime. For rest   Yes Historical Provider, MD  methocarbamol (ROBAXIN) 500 MG tablet Take 1 tablet (500 mg total) by mouth every 6 (six) hours as needed for muscle spasms. 11/23/14  Yes Radene Gunning, NP  Multiple Vitamins-Minerals (CENTRUM SILVER) tablet Take 1 tablet by mouth 2 (two) times daily.   Yes Historical Provider, MD  multivitamin-lutein (OCUVITE-LUTEIN) CAPS capsule Take 1 capsule by mouth 2 (two) times daily.   Yes  Historical Provider, MD  omeprazole (PRILOSEC) 20 MG capsule Take 20 mg by mouth daily. Reported on 06/25/2015 12/12/12  Yes Historical Provider, MD  polyethylene glycol (MIRALAX / GLYCOLAX) packet Take 17 g by mouth daily.   Yes Historical Provider, MD  predniSONE (DELTASONE) 2.5 MG tablet Take 2.5 mg by mouth daily.   Yes Historical Provider, MD  Probiotic Product (ALIGN) 4 MG CAPS Take 1 capsule by mouth daily.   Yes Historical Provider, MD  sertraline (ZOLOFT) 50 MG tablet Take 50 mg by mouth daily.   Yes Historical Provider, MD  Wheat Dextrin (BENEFIBER DRINK MIX PO) Take 10 mLs by mouth daily with breakfast. Mix 2 teaspoonfuls with water and drink daily.   Yes Historical Provider, MD  Oxycodone HCl 10 MG TABS Take 1 tablet (10 mg total) by mouth 4 (four) times daily as needed. 06/25/15   Dorie Rank, MD   BP 203/79 mmHg  Pulse 82  Temp(Src) 98.3 F (36.8 C) (Oral)  Resp 18  SpO2 98% Physical Exam  Constitutional: No distress.  Elderly, frail   HENT:  Head: Normocephalic and atraumatic.  Right Ear: External ear normal.  Left Ear: External ear normal.  Eyes: Conjunctivae are normal. Right eye exhibits no discharge. Left eye exhibits no discharge. No scleral icterus.  Neck: Neck supple. No tracheal deviation present.  Cardiovascular: Normal rate, regular rhythm and intact distal pulses.   Pulmonary/Chest: Effort normal and breath sounds normal. No stridor. No respiratory distress. She has no wheezes. She has no rales.  Abdominal: Soft. Bowel sounds are normal. She exhibits no distension. There is no tenderness. There is no rebound and no guarding.  Musculoskeletal: She exhibits no edema or tenderness.       Thoracic back: She exhibits bony tenderness. She exhibits no swelling, no edema and no deformity.       Back:  Neurological: She is alert. She has normal strength. No cranial nerve deficit (no facial droop, extraocular movements intact, no slurred speech) or sensory deficit. She  exhibits normal muscle tone. She displays no seizure activity. Coordination normal.  Skin: Skin is warm and dry. No rash noted.  Psychiatric: She has a normal mood and affect.  Nursing note and vitals reviewed.   ED Course  Procedures (including critical care time) Labs Review Labs Reviewed  CBC - Abnormal; Notable for the following:    WBC 11.0 (*)    RBC 3.65 (*)    All other components within normal limits  COMPREHENSIVE METABOLIC PANEL - Abnormal; Notable for the following:    Chloride 98 (*)    Glucose, Bld 103 (*)    GFR calc non Af Amer 59 (*)    All other components within normal limits  PROTIME-INR  TROPONIN I    Imaging Review Dg Chest 2 View  06/25/2015  CLINICAL DATA:  Cough for 2 weeks, thoracic spine pain from coughing, history hypertension, pacemaker EXAM: CHEST  2 VIEW COMPARISON:  12/03/2014 FINDINGS: LEFT subclavian transvenous pacemaker leads project at RIGHT atrium and RIGHT ventricle. Enlargement of cardiac silhouette. Atherosclerotic calcification aorta. Eventration RIGHT diaphragm stable. Mediastinal contours and pulmonary vascularity normal. Emphysematous and bronchitic changes consistent with COPD. No acute infiltrate, pleural effusion or pneumothorax. Calcifications in the breasts bilaterally again noted. Bones diffusely demineralized. Posttraumatic deformity proximal LEFT humerus, old. Chronic compression fractures in the mid thoracic spine grossly stable. IMPRESSION: Enlargement of cardiac silhouette post pacemaker. COPD changes without acute infiltrate. Electronically Signed   By: Lavonia Dana M.D.   On: 06/25/2015 12:40   Dg Thoracic Spine 2 View  06/25/2015  CLINICAL DATA:  Back pain after cough for 2-3 days, pain upper thoracic spine EXAM: THORACIC SPINE 2 VIEWS COMPARISON:  12/03/2014 FINDINGS: Mild sigmoid scoliotic curvature. Significant compression deformity at the junction of the upper to mid thoracic spine stable. Mild to moderate compression deformity 2  levels below this stable to minimally progressed. Mild compression deformity 1 level further inferior also stable. IMPRESSION: Stable thoracic spine compression deformities Electronically Signed   By: Skipper Cliche M.D.   On: 06/25/2015 13:46   I have personally reviewed and evaluated these images and lab results as part of my medical decision-making.   EKG Interpretation   Date/Time:  Saturday June 25 2015  13:43:15 EST Ventricular Rate:  81 PR Interval:  193 QRS Duration: 95 QT Interval:  373 QTC Calculation: 433 R Axis:   57 Text Interpretation:  Sinus rhythm Minimal ST depression, lateral leads No  significant change since last tracing Confirmed by Jonice Cerra  MD-J, Traye Bates  [id number]) on 06/25/2015 1:51:13 PM      MDM   Final diagnoses:  URI, acute  Intercostal muscle strain, initial encounter   Patient's labs and x-rays are reassuring. No evidence to suggest pneumonia or pneumothorax. Symptoms are atypical for cardiac disease. Cardiac enzymes and EKG are reassuring  Patient's symptoms are suggestive of chest wall pain most likely related to her coughing. There is no evidence of rib fracture although I did explain to the family she could've an occult rib fracture. Thoracic spine films do not show any evidence of a compression fracture.  Patient was given morphine in the emergency room with some improvement. Plan on discharge home with oxycodone for pain. She will hold her hydrocodone    Dorie Rank, MD 06/25/15 [phone]

## 2015-06-30 ENCOUNTER — Emergency Department (HOSPITAL_COMMUNITY): Payer: Medicare Other

## 2015-06-30 ENCOUNTER — Encounter (HOSPITAL_COMMUNITY): Payer: Self-pay | Admitting: Cardiology

## 2015-06-30 ENCOUNTER — Inpatient Hospital Stay (HOSPITAL_COMMUNITY)
Admission: EM | Admit: 2015-06-30 | Discharge: 2015-07-02 | DRG: 479 | Disposition: A | Discharge status: Nursing Home (Includes ICF) | Payer: Medicare Other | Attending: Internal Medicine | Admitting: Internal Medicine

## 2015-06-30 DIAGNOSIS — S22019A Unspecified fracture of first thoracic vertebra, initial encounter for closed fracture: Secondary | ICD-10-CM

## 2015-06-30 DIAGNOSIS — Z7982 Long term (current) use of aspirin: Secondary | ICD-10-CM

## 2015-06-30 DIAGNOSIS — D638 Anemia in other chronic diseases classified elsewhere: Secondary | ICD-10-CM | POA: Diagnosis present

## 2015-06-30 DIAGNOSIS — Z79899 Other long term (current) drug therapy: Secondary | ICD-10-CM

## 2015-06-30 DIAGNOSIS — E7800 Pure hypercholesterolemia, unspecified: Secondary | ICD-10-CM

## 2015-06-30 DIAGNOSIS — K5909 Other constipation: Secondary | ICD-10-CM | POA: Diagnosis present

## 2015-06-30 DIAGNOSIS — Z885 Allergy status to narcotic agent status: Secondary | ICD-10-CM | POA: Diagnosis not present

## 2015-06-30 DIAGNOSIS — K219 Gastro-esophageal reflux disease without esophagitis: Secondary | ICD-10-CM | POA: Diagnosis present

## 2015-06-30 DIAGNOSIS — E785 Hyperlipidemia, unspecified: Secondary | ICD-10-CM | POA: Diagnosis present

## 2015-06-30 DIAGNOSIS — M353 Polymyalgia rheumatica: Secondary | ICD-10-CM | POA: Diagnosis present

## 2015-06-30 DIAGNOSIS — E039 Hypothyroidism, unspecified: Secondary | ICD-10-CM | POA: Diagnosis present

## 2015-06-30 DIAGNOSIS — M545 Low back pain: Secondary | ICD-10-CM | POA: Diagnosis not present

## 2015-06-30 DIAGNOSIS — S22018A Other fracture of first thoracic vertebra, initial encounter for closed fracture: Secondary | ICD-10-CM

## 2015-06-30 DIAGNOSIS — S22000A Wedge compression fracture of unspecified thoracic vertebra, initial encounter for closed fracture: Secondary | ICD-10-CM | POA: Diagnosis not present

## 2015-06-30 DIAGNOSIS — F419 Anxiety disorder, unspecified: Secondary | ICD-10-CM | POA: Diagnosis present

## 2015-06-30 DIAGNOSIS — H3530 Unspecified macular degeneration: Secondary | ICD-10-CM | POA: Diagnosis present

## 2015-06-30 DIAGNOSIS — M810 Age-related osteoporosis without current pathological fracture: Secondary | ICD-10-CM | POA: Diagnosis present

## 2015-06-30 DIAGNOSIS — Z888 Allergy status to other drugs, medicaments and biological substances status: Secondary | ICD-10-CM

## 2015-06-30 DIAGNOSIS — S22060S Wedge compression fracture of T7-T8 vertebra, sequela: Secondary | ICD-10-CM | POA: Diagnosis not present

## 2015-06-30 DIAGNOSIS — M546 Pain in thoracic spine: Secondary | ICD-10-CM | POA: Diagnosis not present

## 2015-06-30 DIAGNOSIS — I10 Essential (primary) hypertension: Secondary | ICD-10-CM | POA: Diagnosis not present

## 2015-06-30 DIAGNOSIS — Z950 Presence of cardiac pacemaker: Secondary | ICD-10-CM

## 2015-06-30 DIAGNOSIS — R918 Other nonspecific abnormal finding of lung field: Secondary | ICD-10-CM | POA: Diagnosis not present

## 2015-06-30 DIAGNOSIS — S22009A Unspecified fracture of unspecified thoracic vertebra, initial encounter for closed fracture: Secondary | ICD-10-CM | POA: Diagnosis present

## 2015-06-30 DIAGNOSIS — Z881 Allergy status to other antibiotic agents status: Secondary | ICD-10-CM | POA: Diagnosis not present

## 2015-06-30 DIAGNOSIS — S22069A Unspecified fracture of T7-T8 vertebra, initial encounter for closed fracture: Secondary | ICD-10-CM | POA: Diagnosis not present

## 2015-06-30 DIAGNOSIS — M4854XA Collapsed vertebra, not elsewhere classified, thoracic region, initial encounter for fracture: Secondary | ICD-10-CM | POA: Diagnosis not present

## 2015-06-30 DIAGNOSIS — E038 Other specified hypothyroidism: Secondary | ICD-10-CM

## 2015-06-30 LAB — BASIC METABOLIC PANEL
Anion gap: 9 (ref 5–15)
BUN: 15 mg/dL (ref 6–20)
CHLORIDE: 98 mmol/L — AB (ref 101–111)
CO2: 28 mmol/L (ref 22–32)
Calcium: 9.4 mg/dL (ref 8.9–10.3)
Creatinine, Ser: 1.07 mg/dL — ABNORMAL HIGH (ref 0.44–1.00)
GFR calc non Af Amer: 44 mL/min — ABNORMAL LOW (ref >60)
GFR, EST AFRICAN AMERICAN: 51 mL/min — AB (ref >60)
Glucose, Bld: 109 mg/dL — ABNORMAL HIGH (ref 65–99)
POTASSIUM: 5 mmol/L (ref 3.5–5.1)
SODIUM: 135 mmol/L (ref 135–145)

## 2015-06-30 LAB — CBC WITH DIFFERENTIAL/PLATELET
BASOS ABS: 0.1 10*3/uL (ref 0.0–0.1)
Basophils Relative: 1 %
Eosinophils Absolute: 0 10*3/uL (ref 0.0–0.7)
Eosinophils Relative: 0 %
HEMATOCRIT: 29.8 % — AB (ref 36.0–46.0)
Hemoglobin: 10.4 g/dL — ABNORMAL LOW (ref 12.0–15.0)
LYMPHS PCT: 26 %
Lymphs Abs: 1.5 10*3/uL (ref 0.7–4.0)
MCH: 34.1 pg — ABNORMAL HIGH (ref 26.0–34.0)
MCHC: 34.9 g/dL (ref 30.0–36.0)
MCV: 97.7 fL (ref 78.0–100.0)
Monocytes Absolute: 0.6 10*3/uL (ref 0.1–1.0)
Monocytes Relative: 11 %
NEUTROS PCT: 62 %
Neutro Abs: 3.5 10*3/uL (ref 1.7–7.7)
Platelets: 316 10*3/uL (ref 150–400)
RBC: 3.05 MIL/uL — AB (ref 3.87–5.11)
RDW: 15.6 % — ABNORMAL HIGH (ref 11.5–15.5)
WBC: 5.6 10*3/uL (ref 4.0–10.5)

## 2015-06-30 LAB — URINALYSIS, ROUTINE W REFLEX MICROSCOPIC
Bilirubin Urine: NEGATIVE
Glucose, UA: NEGATIVE mg/dL
HGB URINE DIPSTICK: NEGATIVE
Ketones, ur: NEGATIVE mg/dL
Leukocytes, UA: NEGATIVE
NITRITE: NEGATIVE
PROTEIN: NEGATIVE mg/dL
SPECIFIC GRAVITY, URINE: 1.007 (ref 1.005–1.030)
pH: 8 (ref 5.0–8.0)

## 2015-06-30 LAB — TROPONIN I: Troponin I: <0.03 ng/mL (ref <0.031)

## 2015-06-30 MED ORDER — CYCLOSPORINE 0.05 % OP EMUL
1.0000 [drp] | Freq: Two times a day (BID) | OPHTHALMIC | Status: DC
  Administered 2015-06-30 – 2015-07-02 (×4): 1 [drp] via OPHTHALMIC
  Filled 2015-06-30 (×4): qty 1

## 2015-06-30 MED ORDER — HYDRALAZINE HCL 20 MG/ML IJ SOLN
10.0000 mg | Freq: Four times a day (QID) | INTRAMUSCULAR | Status: DC | PRN
  Administered 2015-06-30: 10 mg via INTRAVENOUS
  Filled 2015-06-30 (×2): qty 1

## 2015-06-30 MED ORDER — SODIUM CHLORIDE 0.9 % IV SOLN
INTRAVENOUS | Status: AC
  Administered 2015-06-30: 20:00:00 via INTRAVENOUS

## 2015-06-30 MED ORDER — AMLODIPINE BESYLATE 5 MG PO TABS
5.0000 mg | ORAL_TABLET | Freq: Every day | ORAL | Status: DC
  Administered 2015-06-30: 5 mg via ORAL
  Filled 2015-06-30: qty 1

## 2015-06-30 MED ORDER — MELATONIN 1 MG PO TABS: 1.0000 mg | ORAL_TABLET | Freq: Every day | ORAL | Status: DC

## 2015-06-30 MED ORDER — MAGNESIUM 250 MG PO TABS: 1.0000 | ORAL_TABLET | Freq: Every day | ORAL | Status: DC

## 2015-06-30 MED ORDER — ALUM & MAG HYDROXIDE-SIMETH 200-200-20 MG/5ML PO SUSP: 30.0000 mL | Freq: Four times a day (QID) | ORAL | Status: DC | PRN

## 2015-06-30 MED ORDER — ONDANSETRON HCL 4 MG/2ML IJ SOLN: 4.0000 mg | Freq: Four times a day (QID) | INTRAMUSCULAR | Status: DC | PRN

## 2015-06-30 MED ORDER — LEVOTHYROXINE SODIUM 100 MCG PO TABS
100.0000 ug | ORAL_TABLET | Freq: Every day | ORAL | Status: DC
  Administered 2015-07-01 – 2015-07-02 (×2): 100 ug via ORAL
  Filled 2015-06-30 (×2): qty 1

## 2015-06-30 MED ORDER — CALCIUM CARBONATE-VITAMIN D 500-200 MG-UNIT PO TABS
1.0000 | ORAL_TABLET | Freq: Two times a day (BID) | ORAL | Status: DC
  Administered 2015-06-30 – 2015-07-02 (×4): 1 via ORAL
  Filled 2015-06-30 (×4): qty 1

## 2015-06-30 MED ORDER — POLYETHYLENE GLYCOL 3350 17 G PO PACK
17.0000 g | PACK | Freq: Every day | ORAL | Status: DC
  Administered 2015-06-30: 17 g via ORAL
  Filled 2015-06-30: qty 1

## 2015-06-30 MED ORDER — HYDRALAZINE HCL 25 MG PO TABS
25.0000 mg | ORAL_TABLET | Freq: Two times a day (BID) | ORAL | Status: DC
  Administered 2015-06-30 – 2015-07-02 (×4): 25 mg via ORAL
  Filled 2015-06-30 (×4): qty 1

## 2015-06-30 MED ORDER — SERTRALINE HCL 50 MG PO TABS
50.0000 mg | ORAL_TABLET | Freq: Every day | ORAL | Status: DC
  Administered 2015-06-30 – 2015-07-02 (×3): 50 mg via ORAL
  Filled 2015-06-30 (×3): qty 1

## 2015-06-30 MED ORDER — PREDNISONE 5 MG PO TABS
2.5000 mg | ORAL_TABLET | Freq: Every day | ORAL | Status: DC
  Administered 2015-06-30 – 2015-07-02 (×3): 2.5 mg via ORAL
  Filled 2015-06-30 (×3): qty 1

## 2015-06-30 MED ORDER — ASPIRIN 325 MG PO TABS
325.0000 mg | ORAL_TABLET | Freq: Every day | ORAL | Status: DC
  Administered 2015-07-02: 325 mg via ORAL
  Filled 2015-06-30 (×2): qty 1

## 2015-06-30 MED ORDER — MORPHINE SULFATE (PF) 2 MG/ML IV SOLN
2.0000 mg | INTRAVENOUS | Status: DC | PRN
  Administered 2015-07-01: 2 mg via INTRAVENOUS
  Filled 2015-06-30: qty 1

## 2015-06-30 MED ORDER — CARVEDILOL 3.125 MG PO TABS
3.1250 mg | ORAL_TABLET | Freq: Two times a day (BID) | ORAL | Status: DC
  Administered 2015-07-01: 3.125 mg via ORAL
  Filled 2015-06-30: qty 1

## 2015-06-30 MED ORDER — CALCIUM CARBONATE-VITAMIN D 600-400 MG-UNIT PO TABS: 1.0000 | ORAL_TABLET | Freq: Two times a day (BID) | ORAL | Status: DC

## 2015-06-30 MED ORDER — PANTOPRAZOLE SODIUM 40 MG PO TBEC
40.0000 mg | DELAYED_RELEASE_TABLET | Freq: Every day | ORAL | Status: DC
  Administered 2015-06-30 – 2015-07-02 (×3): 40 mg via ORAL
  Filled 2015-06-30 (×3): qty 1

## 2015-06-30 MED ORDER — MORPHINE SULFATE (PF) 4 MG/ML IV SOLN
4.0000 mg | Freq: Once | INTRAVENOUS | Status: AC
  Administered 2015-06-30: 4 mg via INTRAVENOUS
  Filled 2015-06-30: qty 1

## 2015-06-30 MED ORDER — DOCUSATE SODIUM 100 MG PO CAPS
100.0000 mg | ORAL_CAPSULE | Freq: Every day | ORAL | Status: DC
  Administered 2015-06-30 – 2015-07-01 (×2): 100 mg via ORAL
  Filled 2015-06-30 (×2): qty 1

## 2015-06-30 MED ORDER — ALPRAZOLAM 0.25 MG PO TABS
0.2500 mg | ORAL_TABLET | Freq: Every evening | ORAL | Status: DC | PRN
  Administered 2015-06-30 – 2015-07-01 (×2): 0.25 mg via ORAL
  Filled 2015-06-30 (×2): qty 1

## 2015-06-30 MED ORDER — ONDANSETRON HCL 4 MG PO TABS: 4.0000 mg | ORAL_TABLET | Freq: Four times a day (QID) | ORAL | Status: DC | PRN

## 2015-06-30 MED ORDER — MAGNESIUM OXIDE 400 (241.3 MG) MG PO TABS
200.0000 mg | ORAL_TABLET | Freq: Every day | ORAL | Status: DC
  Administered 2015-06-30 – 2015-07-01 (×2): 200 mg via ORAL
  Filled 2015-06-30 (×2): qty 1

## 2015-06-30 MED ORDER — HYDROCODONE-ACETAMINOPHEN 5-325 MG PO TABS
1.0000 | ORAL_TABLET | ORAL | Status: DC | PRN
  Administered 2015-06-30 – 2015-07-01 (×2): 2 via ORAL
  Administered 2015-07-01: 1 via ORAL
  Administered 2015-07-01 – 2015-07-02 (×2): 2 via ORAL
  Filled 2015-06-30 (×2): qty 2
  Filled 2015-06-30: qty 1
  Filled 2015-06-30 (×2): qty 2

## 2015-06-30 MED ORDER — METHOCARBAMOL 500 MG PO TABS: 500.0000 mg | ORAL_TABLET | Freq: Four times a day (QID) | ORAL | Status: DC | PRN

## 2015-06-30 NOTE — ED Notes (Signed)
Report attempted 

## 2015-06-30 NOTE — ED Notes (Addendum)
Pt made aware of bed assignment 

## 2015-06-30 NOTE — ED Notes (Signed)
Report attempted #3

## 2015-06-30 NOTE — ED Provider Notes (Signed)
CSN: [id number]     Arrival date & time 06/30/15  1048 History   First MD Initiated Contact with Patient 06/30/15 1601     Chief Complaint  Patient presents with  . Back Pain     (Consider location/radiation/quality/duration/timing/severity/associated sxs/prior Treatment) HPI  82 year old female presents with continued back pain. Started 6 days ago during a coughing spell while in the middle of a URI. Cough has resolved. Seen in ED 5 days ago, d/c'd home off of hydrocodone and switched to oxycodone. Couldn't tolerate due to nausea. Back on hydrocodone 5-325 q8. It helps but still having pain. Pain is currently severe. Is constant, worse with movement and breaths. No rashes. Xrays performed 5 days ago negative, were told CT could show fractures not seen on Xray and family now wants these. No chest pain, dyspnea, abdominal pain, weakness/numbness.  Past Medical History  Diagnosis Date  . Hypertension   . Hypothyroidism   . Bradycardia   . Anemia   . Diverticulosis   . External hemorrhoids   . Constipation   . Macular degeneration   . Seasonal allergies   . Osteoporosis   . Degenerative joint disease   . Palpitations   . Hyperlipidemia   . GERD (gastroesophageal reflux disease)   . Pacemaker   . Thyrotoxicosis without mention of goiter or other cause, without mention of thyrotoxic crisis or storm   . Goiter, unspecified    Past Surgical History  Procedure Laterality Date  . Thyroidectomy    . Tonsillectomy    . Colonoscopy  November 2011    External hemorrhoids, diverticulosis, anal papillae  . Insert / replace / remove pacemaker    . Vertebroplasty    . Femur im nail Left 12/29/2012    Procedure: INTRAMEDULLARY (IM) NAIL INTERTROCHANTRIC;  Surgeon: Melina Schools, MD;  Location: [place];  Service: Orthopedics;  Laterality: Left;  . Permanent pacemaker insertion N/A 06/07/2011    Procedure: PERMANENT PACEMAKER INSERTION;  Surgeon: Evans Lance, MD;  Location: [facility] CATH LAB;   Service: Cardiovascular;  Laterality: N/A;  . Colonoscopy N/A 04/01/2014    Procedure: COLONOSCOPY;  Surgeon: Rogene Houston, MD;  Location: [facility];  Service: Endoscopy;  Laterality: N/A;  930   Family History  Problem Relation Age of Onset  . Heart attack Mother     Deceased  . Cancer Father     Deceased, colon cancer age 80  . Cancer Brother     Deceased, throat and lung   Social History  Substance Use Topics  . Smoking status: Never Smoker   . Smokeless tobacco: Never Used  . Alcohol Use: No   OB History    No data available     Review of Systems  Constitutional: Negative for fever.  Respiratory: Negative for cough and shortness of breath.   Cardiovascular: Negative for chest pain.  Gastrointestinal: Negative for abdominal pain.  Genitourinary: Negative for dysuria.  Musculoskeletal: Positive for back pain.  Skin: Negative for rash.  Neurological: Negative for weakness and numbness.  All other systems reviewed and are negative.     Allergies  Tramadol; Oxycodone; and Sulfa antibiotics  Home Medications   Prior to Admission medications   Medication Sig Start Date End Date Taking? Authorizing Provider  acetaminophen (TYLENOL) 500 MG tablet Take 1,000 mg by mouth every 8 (eight) hours as needed for mild pain.    Historical Provider, MD  ALPRAZolam Duanne Moron) 0.25 MG tablet Take 0.25 mg by mouth at bedtime  as needed for sleep.    Historical Provider, MD  amLODipine (NORVASC) 5 MG tablet Take 5 mg by mouth at bedtime.  08/11/14   Historical Provider, MD  amoxicillin-clavulanate (AUGMENTIN) 500-125 MG tablet Take 1 tablet by mouth 2 (two) times daily. Started 03/03 for 10 days    Historical Provider, MD  aspirin 325 MG tablet Take 325 mg by mouth daily.    Historical Provider, MD  bisacodyl (FLEET) 10 MG/30ML ENEM Place 10 mg rectally every other day as needed (for bowel movement).    Historical Provider, MD  Calcium Carbonate-Vitamin D (CALCIUM 600+D) 600-400  MG-UNIT tablet Take 1 tablet by mouth 2 (two) times daily.    Historical Provider, MD  carbamide peroxide (DEBROX) 6.5 % otic solution Place 5 drops into both ears daily.    Historical Provider, MD  cycloSPORINE (RESTASIS) 0.05 % ophthalmic emulsion Place 1 drop into both eyes 2 (two) times daily. *Self Administration*    Historical Provider, MD  Dextromethorphan-Guaifenesin (MUCINEX DM MAXIMUM STRENGTH) 60-1200 MG TB12 Take 1 tablet by mouth 2 (two) times daily. Started 03/03 for 1 week    Historical Provider, MD  docusate sodium (COLACE) 100 MG capsule Take 100 mg by mouth at bedtime. For constipation 01/01/13   Ripudeep Krystal Eaton, MD  fluticasone (FLONASE) 50 MCG/ACT nasal spray Place 2 sprays into the nose daily. For allergies    Historical Provider, MD  hydrALAZINE (APRESOLINE) 25 MG tablet Take 25 mg by mouth 2 (two) times daily.     Historical Provider, MD  hydrocortisone (ANUSOL-HC) 2.5 % rectal cream Place rectally 3 (three) times daily. Patient taking differently: Place 1 application rectally 3 (three) times daily.  12/06/14   Maryann Mikhail, DO  Hypertonic Nasal Wash (SINUS RINSE NA) Place 1 application into both nostrils daily.    Historical Provider, MD  Iron-FA-B Cmp-C-Biot-Probiotic (FUSION PLUS PO) Take 1 capsule by mouth daily.    Historical Provider, MD  levothyroxine (SYNTHROID, LEVOTHROID) 100 MCG tablet Take 100 mcg by mouth daily before breakfast.    Historical Provider, MD  lisinopril (PRINIVIL,ZESTRIL) 20 MG tablet Take 20 mg by mouth daily.     Historical Provider, MD  loperamide (IMODIUM) 2 MG capsule Take 2 mg by mouth every 4 (four) hours as needed for diarrhea or loose stools.    Historical Provider, MD  loratadine (CLARITIN) 10 MG tablet Take 10 mg by mouth daily.    Historical Provider, MD  Magnesium 250 MG TABS Take 1 tablet by mouth at bedtime.     Historical Provider, MD  magnesium hydroxide (MILK OF MAGNESIA) 400 MG/5ML suspension Take 0.5 mLs by mouth as needed for mild  constipation.    Historical Provider, MD  Melatonin 1 MG TABS Take 1 mg by mouth at bedtime. For rest    Historical Provider, MD  methocarbamol (ROBAXIN) 500 MG tablet Take 1 tablet (500 mg total) by mouth every 6 (six) hours as needed for muscle spasms. 11/23/14   Radene Gunning, NP  Multiple Vitamins-Minerals (CENTRUM SILVER) tablet Take 1 tablet by mouth 2 (two) times daily.    Historical Provider, MD  multivitamin-lutein (OCUVITE-LUTEIN) CAPS capsule Take 1 capsule by mouth 2 (two) times daily.    Historical Provider, MD  omeprazole (PRILOSEC) 20 MG capsule Take 20 mg by mouth daily. Reported on 06/25/2015 12/12/12   Historical Provider, MD  Oxycodone HCl 10 MG TABS Take 1 tablet (10 mg total) by mouth 4 (four) times daily as needed. 06/25/15   Wille Glaser  Tomi Bamberger, MD  polyethylene glycol (MIRALAX / GLYCOLAX) packet Take 17 g by mouth daily.    Historical Provider, MD  predniSONE (DELTASONE) 2.5 MG tablet Take 2.5 mg by mouth daily.    Historical Provider, MD  Probiotic Product (ALIGN) 4 MG CAPS Take 1 capsule by mouth daily.    Historical Provider, MD  sertraline (ZOLOFT) 50 MG tablet Take 50 mg by mouth daily.    Historical Provider, MD  Wheat Dextrin (BENEFIBER DRINK MIX PO) Take 10 mLs by mouth daily with breakfast. Mix 2 teaspoonfuls with water and drink daily.    Historical Provider, MD   BP 158/49 mmHg  Pulse 74  Temp(Src) 99 F (37.2 C) (Oral)  Resp 17  SpO2 100% Physical Exam  Constitutional: She is oriented to person, place, and time. She appears well-developed and well-nourished.  HENT:  Head: Normocephalic and atraumatic.  Right Ear: External ear normal.  Left Ear: External ear normal.  Nose: Nose normal.  Eyes: Right eye exhibits no discharge. Left eye exhibits no discharge.  Neck: Neck supple.  Cardiovascular: Normal rate, regular rhythm and normal heart sounds.   Pulses:      Radial pulses are 2+ on the right side, and 2+ on the left side.       Dorsalis pedis pulses are 2+ on the  right side, and 2+ on the left side.  Pulmonary/Chest: Effort normal and breath sounds normal.  Abdominal: Soft. She exhibits no distension. There is no tenderness.  Musculoskeletal:       Cervical back: She exhibits no tenderness.       Thoracic back: She exhibits tenderness. She exhibits no bony tenderness.       Lumbar back: She exhibits no tenderness.       Back:  Neurological: She is alert and oriented to person, place, and time.  Reflex Scores:      Patellar reflexes are 2+ on the right side and 2+ on the left side. 5/5 strength in all 4 extremities. Grossly normal sensation.   Skin: Skin is warm and dry. She is not diaphoretic.  Nursing note and vitals reviewed.   ED Course  Procedures (including critical care time) Labs Review Labs Reviewed  BASIC METABOLIC PANEL - Abnormal; Notable for the following:    Chloride 98 (*)    Glucose, Bld 109 (*)    Creatinine, Ser 1.07 (*)    GFR calc non Af Amer 44 (*)    GFR calc Af Amer 51 (*)    All other components within normal limits  CBC WITH DIFFERENTIAL/PLATELET - Abnormal; Notable for the following:    RBC 3.05 (*)    Hemoglobin 10.4 (*)    HCT 29.8 (*)    MCH 34.1 (*)    RDW 15.6 (*)    All other components within normal limits  TROPONIN I  URINALYSIS, ROUTINE W REFLEX MICROSCOPIC (NOT AT [facility])    Imaging Review Ct Chest Wo Contrast  06/30/2015  CLINICAL DATA:  Chest pain around ribcage and upper back pain for the past week EXAM: CT CHEST WITHOUT CONTRAST TECHNIQUE: Multidetector CT imaging of the chest was performed following the standard protocol without IV contrast. COMPARISON:  06/25/2015 FINDINGS: Mediastinum/Nodes: Cardiac pacer noted. Trace pericardial effusion. Mild cardiac enlargement. Calcification of the thoracic aorta. No significant hilar or mediastinal adenopathy. Lungs/Pleura: Trace and possibly loculated right pleural effusion. 4 mm pulmonary nodule in the inferior right upper lobe based along the minor  fissure. 3 mm pulmonary nodule lateral  right lower lobe and 4 mm pulmonary nodule anterior inferior right middle lobe. Mild discoid atelectasis right lower lobe 4 mm pulmonary nodule in the lingula image number 27. Left lung otherwise clear. Upper abdomen: No acute findings Musculoskeletal: 50% T8 compression deformity with no retropulsion. Approximate 25% T7 compression deformity with no retropulsion. Over 50% T6 compression deformity with no retropulsion. This level is status post kyphoplasty. These levels appear unchanged from 06/25/2015. 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 Old anterior right fifth sixth and seventh rib fractures. No acute rib fractures identified. IMPRESSION: Multiple bilateral tiny pulmonary nodules. If the patient is at high risk for bronchogenic carcinoma, follow-up chest CT at 1 year is recommended. If the patient is at low risk, no follow-up is needed. This recommendation follows the consensus statement: Guidelines for Management of Small Pulmonary Nodules Detected on CT Scans: A Statement from the [employer] as published in Radiology 2005; 237:395-400. Old right rib fractures. Trace loculated right pleural effusion or chronic pleural thickening possibly related. Compression deformities thoracic spine at three consecutive levels appear without acute features and stable from 06/25/15, with the most cephalad and severe status post kyphoplasty. Electronically Signed   By: Skipper Cliche M.D.   On: 06/30/2015 16:53   I have personally reviewed and evaluated these images and lab results as part of my medical decision-making.   EKG Interpretation   Date/Time:  Thursday June 30 2015 17:02:07 EST Ventricular Rate:  61 PR Interval:  206 QRS Duration: 93 QT Interval:  419 QTC Calculation: 422 R Axis:   64 Text Interpretation:  Atrial-paced complexes Nonspecific T abnrm,  anterolateral leads no significant change since Mar 2017  Confirmed by  Regenia Skeeter  MD, Seth Ward [phone]) on 06/30/2015 5:20:26 PM      MDM   Final diagnoses:  Closed wedge fracture of thoracic vertebra, unspecified thoracic vertebral level, initial encounter ([place])    Patient's pain seems to be either muscular or from the 50% T8 fracture. Discussed with IR, Dr. Estanislado Pandy, who can perform kyphoplasty tomorrow. Request hospitalist admission. Patient's pain is better with morphine but still quite poor and I think she will need admission for IV pain control until her procedure. Patient and family in agreement with plan. Admit to hospitalist.    Sherwood Gambler, MD 06/30/15 [phone]

## 2015-06-30 NOTE — ED Notes (Signed)
Pt reports about a weeka go she was coughing and feel a "catch" in her back. States she was seen and given pain medication but has continued to feel worse.

## 2015-06-30 NOTE — Progress Notes (Signed)

## 2015-06-30 NOTE — H&P (Signed)
Patient Demographics:    [name], is a 82 y.o. female  MRN: [id number]   DOB - November 12, 1922  Admit Date - 06/30/2015  Outpatient Primary MD for the patient is Wende Neighbors, MD   With History of -  Past Medical History  Diagnosis Date  . Hypertension   . Hypothyroidism   . Bradycardia   . Anemia   . Diverticulosis   . External hemorrhoids   . Constipation   . Macular degeneration   . Seasonal allergies   . Osteoporosis   . Degenerative joint disease   . Palpitations   . Hyperlipidemia   . GERD (gastroesophageal reflux disease)   . Pacemaker   . Thyrotoxicosis without mention of goiter or other cause, without mention of thyrotoxic crisis or storm   . Goiter, unspecified       Past Surgical History  Procedure Laterality Date  . Thyroidectomy    . Tonsillectomy    . Colonoscopy  November 2011    External hemorrhoids, diverticulosis, anal papillae  . Insert / replace / remove pacemaker    . Vertebroplasty    . Femur im nail Left 12/29/2012    Procedure: INTRAMEDULLARY (IM) NAIL INTERTROCHANTRIC;  Surgeon: Melina Schools, MD;  Location: [place];  Service: Orthopedics;  Laterality: Left;  . Permanent pacemaker insertion N/A 06/07/2011    Procedure: PERMANENT PACEMAKER INSERTION;  Surgeon: Evans Lance, MD;  Location: [facility] CATH LAB;  Service: Cardiovascular;  Laterality: N/A;  . Colonoscopy N/A 04/01/2014    Procedure: COLONOSCOPY;  Surgeon: Rogene Houston, MD;  Location: [facility];  Service: Endoscopy;  Laterality: N/A;  930    in for   Chief Complaint  Patient presents with  . Back Pain      HPI:    [name]  is a 82 y.o. female,  With history of polymyalgia rheumatica on steroid taper for another month, pacemaker placement by Dr. Lovena Le several years ago for sick sinus, GERD, anemia of  chronic disease, hypothyroidism, essential hypertension, history of T-spine fracture in the past requiring kyphoplasty by Dr. Estanislado Pandy several years ago, who lives in assisted living facility comes in after an episode of cough a few days ago after which she experienced a sharp upper back pain which is constant, worse with movement and better with rest and pain medications, no associated symptoms, no radiation, patient continued to have back pain and came to the ER where she was diagnosed with an acute T-spine fracture, case was discussed with interventional radiologist Dr. Estanislado Pandy who requested hospitalist admission for a possible kyphoplasty tomorrow.  Patient currently besides the above dictated back pain is symptom free, denies any fever or chills, no headache or chest pain, no cough and shortness of breath, she recently was diagnosed with URI and has finished her antibiotic treatment yesterday, no abdominal pain, no dysuria, no blood in stool or urine. No focal weakness.  Review of systems:    In addition to the HPI above,   No Fever-chills, No Headache, No changes with Vision or hearing, No problems swallowing food or Liquids, No Chest pain, Cough or Shortness of Breath, No Abdominal pain, No Nausea or Vommitting, Bowel movements are regular, No Blood in stool or Urine, No dysuria, No new skin rashes or bruises, No new joints pains-aches, except for back pain as above No new weakness, tingling, numbness in any extremity, No recent weight gain or loss, No polyuria, polydypsia or polyphagia, No significant Mental Stressors.  A full 10 point Review of Systems was done, except as stated above, all other Review of Systems were negative.    Social History:     Social History  Substance Use Topics  . Smoking status: Never Smoker   . Smokeless tobacco: Never Used  . Alcohol Use: No    Lives - at an ALF, mobile      Family History :     Family History  Problem Relation Age  of Onset  . Heart attack Mother     Deceased  . Cancer Father     Deceased, colon cancer age 29  . Cancer Brother     Deceased, throat and lung       Home Medications:   Prior to Admission medications   Medication Sig Start Date End Date Taking? Authorizing Provider  acetaminophen (TYLENOL) 500 MG tablet Take 1,000 mg by mouth every 8 (eight) hours as needed for mild pain.   Yes Historical Provider, MD  ALPRAZolam Duanne Moron) 0.25 MG tablet Take 0.25 mg by mouth at bedtime as needed for sleep.   Yes Historical Provider, MD  amLODipine (NORVASC) 5 MG tablet Take 5 mg by mouth at bedtime.  08/11/14  Yes Historical Provider, MD  amoxicillin-clavulanate (AUGMENTIN) 500-125 MG tablet Take 1 tablet by mouth 2 (two) times daily. Started 03/03 for 10 days   Yes Historical Provider, MD  aspirin 325 MG tablet Take 325 mg by mouth daily.   Yes Historical Provider, MD  benzonatate (TESSALON) 200 MG capsule Take 200 mg by mouth 3 (three) times daily as needed for cough.   Yes Historical Provider, MD  Calcium Carbonate-Vitamin D (CALCIUM 600+D) 600-400 MG-UNIT tablet Take 1 tablet by mouth 2 (two) times daily.   Yes Historical Provider, MD  carbamide peroxide (DEBROX) 6.5 % otic solution Place 1 drop into both ears daily.    Yes Historical Provider, MD  cycloSPORINE (RESTASIS) 0.05 % ophthalmic emulsion Place 1 drop into both eyes 2 (two) times daily. *Self Administration*   Yes Historical Provider, MD  Dextromethorphan-Guaifenesin (MUCINEX DM MAXIMUM STRENGTH) 60-1200 MG TB12 Take 1 tablet by mouth 2 (two) times daily. Started 03/03 for 1 week   Yes Historical Provider, MD  docusate sodium (COLACE) 100 MG capsule Take 100 mg by mouth at bedtime. For constipation 01/01/13  Yes Ripudeep Krystal Eaton, MD  fluticasone (FLONASE) 50 MCG/ACT nasal spray Place 2 sprays into the nose daily. For allergies   Yes Historical Provider, MD  hydrALAZINE (APRESOLINE) 25 MG tablet Take 25 mg by mouth 2 (two) times daily.    Yes  Historical Provider, MD  HYDROcodone-acetaminophen (NORCO/VICODIN) 5-325 MG tablet Take 1 tablet by mouth every 8 (eight) hours as needed for moderate pain.   Yes Historical Provider, MD  hydrocortisone (ANUSOL-HC) 2.5 % rectal cream Place rectally 3 (three) times daily. Patient taking differently: Place 1 application rectally 3 (three) times daily.  12/06/14  Yes Maryann  Mikhail, DO  Iron-FA-B Cmp-C-Biot-Probiotic (FUSION PLUS PO) Take 1 capsule by mouth daily.   Yes Historical Provider, MD  levothyroxine (SYNTHROID, LEVOTHROID) 100 MCG tablet Take 100 mcg by mouth daily before breakfast.   Yes Historical Provider, MD  lisinopril (PRINIVIL,ZESTRIL) 20 MG tablet Take 20 mg by mouth daily.    Yes Historical Provider, MD  loratadine (CLARITIN) 10 MG tablet Take 10 mg by mouth daily.   Yes Historical Provider, MD  Magnesium 250 MG TABS Take 1 tablet by mouth at bedtime.    Yes Historical Provider, MD  magnesium hydroxide (MILK OF MAGNESIA) 400 MG/5ML suspension Take 0.5 mLs by mouth as needed for mild constipation.   Yes Historical Provider, MD  Melatonin 1 MG TABS Take 1 mg by mouth at bedtime. For rest   Yes Historical Provider, MD  methocarbamol (ROBAXIN) 500 MG tablet Take 1 tablet (500 mg total) by mouth every 6 (six) hours as needed for muscle spasms. 11/23/14  Yes Radene Gunning, NP  Multiple Vitamins-Minerals (CENTRUM SILVER) tablet Take 1 tablet by mouth 2 (two) times daily.   Yes Historical Provider, MD  multivitamin-lutein (OCUVITE-LUTEIN) CAPS capsule Take 1 capsule by mouth 2 (two) times daily.   Yes Historical Provider, MD  omeprazole (PRILOSEC) 20 MG capsule Take 20 mg by mouth daily. Reported on 06/25/2015 12/12/12  Yes Historical Provider, MD  Oxycodone HCl 10 MG TABS Take 1 tablet (10 mg total) by mouth 4 (four) times daily as needed. 06/25/15  Yes Dorie Rank, MD  polyethylene glycol [facility] / GLYCOLAX) packet Take 17 g by mouth daily.   Yes Historical Provider, MD  predniSONE (DELTASONE) 2.5  MG tablet Take 2.5 mg by mouth daily.   Yes Historical Provider, MD  Probiotic Product (ALIGN) 4 MG CAPS Take 1 capsule by mouth daily.   Yes Historical Provider, MD  sertraline (ZOLOFT) 50 MG tablet Take 50 mg by mouth daily.   Yes Historical Provider, MD  Wheat Dextrin (BENEFIBER DRINK MIX PO) Take 10 mLs by mouth daily with breakfast. Mix 2 teaspoonfuls with water and drink daily.   Yes Historical Provider, MD  Hypertonic Nasal Wash (SINUS RINSE NA) Place 1 application into both nostrils daily.    Historical Provider, MD  loperamide (IMODIUM) 2 MG capsule Take 2 mg by mouth every 4 (four) hours as needed for diarrhea or loose stools.    Historical Provider, MD     Allergies:     Allergies  Allergen Reactions  . Tramadol Itching and Nausea Only  . Sulfa Antibiotics Nausea And Vomiting     Physical Exam:   Vitals  Blood pressure 198/67, pulse 69, temperature 99 F (37.2 C), temperature source Oral, resp. rate 9, SpO2 97 %.   1. General frail, elderly white female lying in bed in moderate distress due to back pain   2. Normal affect and insight, Not Suicidal or Homicidal, Awake Alert, Oriented X 3.  3. No F.N deficits, ALL C.Nerves Intact, Strength 5/5 all 4 extremities, Sensation intact all 4 extremities, Plantars down going.  4. Ears and Eyes appear Normal, Conjunctivae clear, PERRLA. Moist Oral Mucosa.  5. Supple Neck, No JVD, No cervical lymphadenopathy appriciated, No Carotid Bruits.  6. Symmetrical Chest wall movement, Good air movement bilaterally, CTAB.  7. RRR, No Gallops, Rubs or Murmurs, No Parasternal Heave.  8. Positive Bowel Sounds, Abdomen Soft, No tenderness, No organomegaly appriciated,No rebound -guarding or rigidity.  9.  No Cyanosis, Normal Skin Turgor, No Skin Rash or Bruise.  10. Good muscle tone,  joints appear normal , no effusions, Normal ROM. Some tenderness over the T spine area on palpation  11. No Palpable Lymph Nodes in Neck or Axillae       Data Review:    CBC  Recent Labs Lab 06/25/15 1350 06/30/15 1630  WBC 11.0* 5.6  HGB 12.3 10.4*  HCT 36.1 29.8*  PLT 385 316  MCV 98.9 97.7  MCH 33.7 34.1*  MCHC 34.1 34.9  RDW 15.3 15.6*  LYMPHSABS  --  1.5  MONOABS  --  0.6  EOSABS  --  0.0  BASOSABS  --  0.1   ------------------------------------------------------------------------------------------------------------------  Chemistries   Recent Labs Lab 06/25/15 1350 06/30/15 1630  NA 136 135  K 4.5 5.0  CL 98* 98*  CO2 27 28  GLUCOSE 103* 109*  BUN 13 15  CREATININE 0.84 1.07*  CALCIUM 10.1 9.4  AST 35  --   ALT 16  --   ALKPHOS 69  --   BILITOT 0.9  --    ------------------------------------------------------------------------------------------------------------------ CrCl cannot be calculated (Unknown ideal weight.). ------------------------------------------------------------------------------------------------------------------ No results for input(s): TSH, T4TOTAL, T3FREE, THYROIDAB in the last 72 hours.  Invalid input(s): FREET3   Coagulation profile  Recent Labs Lab 06/25/15 1350  INR 1.02   ------------------------------------------------------------------------------------------------------------------- No results for input(s): DDIMER in the last 72 hours. -------------------------------------------------------------------------------------------------------------------  Cardiac Enzymes  Recent Labs Lab 06/25/15 1350 06/30/15 1630  TROPONINI <0.03 <0.03   ------------------------------------------------------------------------------------------------------------------ No results found for: BNP   ---------------------------------------------------------------------------------------------------------------  Urinalysis    Component Value Date/Time   COLORURINE YELLOW 12/03/2014 [place] 12/03/2014 1315   LABSPEC 1.005 12/03/2014 1315   PHURINE 8.0  12/03/2014 1315   GLUCOSEU NEGATIVE 12/03/2014 1315   HGBUR NEGATIVE 12/03/2014 1315   BILIRUBINUR NEGATIVE 12/03/2014 1315   KETONESUR NEGATIVE 12/03/2014 1315   PROTEINUR NEGATIVE 12/03/2014 1315   UROBILINOGEN 0.2 12/03/2014 1315   NITRITE NEGATIVE 12/03/2014 1315   LEUKOCYTESUR SMALL* 12/03/2014 1315    ----------------------------------------------------------------------------------------------------------------   Imaging Results:    Ct Chest Wo Contrast  06/30/2015  CLINICAL DATA:  Chest pain around ribcage and upper back pain for the past week EXAM: CT CHEST WITHOUT CONTRAST TECHNIQUE: Multidetector CT imaging of the chest was performed following the standard protocol without IV contrast. COMPARISON:  06/25/2015 FINDINGS: Mediastinum/Nodes: Cardiac pacer noted. Trace pericardial effusion. Mild cardiac enlargement. Calcification of the thoracic aorta. No significant hilar or mediastinal adenopathy. Lungs/Pleura: Trace and possibly loculated right pleural effusion. 4 mm pulmonary nodule in the inferior right upper lobe based along the minor fissure. 3 mm pulmonary nodule lateral right lower lobe and 4 mm pulmonary nodule anterior inferior right middle lobe. Mild discoid atelectasis right lower lobe 4 mm pulmonary nodule in the lingula image number 27. Left lung otherwise clear. Upper abdomen: No acute findings Musculoskeletal: 50% T8 compression deformity with no retropulsion. Approximate 25% T7 compression deformity with no retropulsion. Over 50% T6 compression deformity with no retropulsion. This level is status post kyphoplasty. These levels appear unchanged from 06/25/2015. 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 Old anterior right fifth sixth and seventh rib fractures. No acute rib fractures identified. IMPRESSION: Multiple bilateral tiny pulmonary nodules. If the patient is at high risk for bronchogenic carcinoma, follow-up chest CT at 1 year is  recommended. If the patient is at low risk, no follow-up is needed. This recommendation follows the consensus statement: Guidelines for Management of Small Pulmonary Nodules Detected on CT  Scans: A Statement from the [place] as published in Radiology 2005; 237:395-400. Old right rib fractures. Trace loculated right pleural effusion or chronic pleural thickening possibly related. Compression deformities thoracic spine at three consecutive levels appear without acute features and stable from 06/25/15, with the most cephalad and severe status post kyphoplasty. Electronically Signed   By: Skipper Cliche M.D.   On: 06/30/2015 16:53    My personal review of EKG: Rhythm paced,  no Acute ST changes   Assessment & Plan:      1. T-spine fracture. Admit for kyphoplasty tomorrow, patient is in excruciating pain, failed outpatient pain control, she will be admitted, pain control, gentle IV fluids, TLSO brace, IR has been consulted for possible kyphoplasty in the morning. Nothing by mouth after midnight. PT after procedure.  2. History of sick sinus with pacemaker placement. Stable.  3. Essential hypertension. Continue home dose hydralazine and Norvasc, add Coreg and as needed IV hydralazine. Hold ACE inhibitor prior to surgery and with borderline creatinine.  4. Anxiety. Continue home dose Xanax.  5. History of polymyalgia rheumatica. Continue home dose prednisone which is 2.5 mg daily.  6. Osteoporosis. Continue calcium and vitamin D supplements.  7. GERD. On PPI.  8. Chronic constipation. Continue bowel regimen.  9. Hypothyroidism . Continue home dose Synthroid.   DVT Prophylaxis  SCDs    AM Labs Ordered, also please review Full Orders  Family Communication: Admission, patients condition and plan of care including tests being ordered have been discussed with the patient and son who indicate understanding and agree with the plan and Code Status.  Code Status Full  Likely DC to  Home  2-3 days  Condition Fair  Time spent in minutes : 35    SINGH,PRASHANT K M.D on 06/30/2015 at 6:07 PM  Between 7am to 7pm - Pager - [phone]  After 7pm go to [email or web address] - password [email or web address]  [facility] - Office  [phone]

## 2015-07-01 ENCOUNTER — Inpatient Hospital Stay (HOSPITAL_COMMUNITY): Payer: Medicare Other

## 2015-07-01 LAB — CBC
HEMATOCRIT: 29.9 % — AB (ref 36.0–46.0)
HEMOGLOBIN: 9.8 g/dL — AB (ref 12.0–15.0)
MCH: 32.2 pg (ref 26.0–34.0)
MCHC: 32.8 g/dL (ref 30.0–36.0)
MCV: 98.4 fL (ref 78.0–100.0)
PLATELETS: 330 10*3/uL (ref 150–400)
RBC: 3.04 MIL/uL — AB (ref 3.87–5.11)
RDW: 15.7 % — ABNORMAL HIGH (ref 11.5–15.5)
WBC: 5.8 10*3/uL (ref 4.0–10.5)

## 2015-07-01 LAB — BASIC METABOLIC PANEL
Anion gap: 12 (ref 5–15)
BUN: 16 mg/dL (ref 6–20)
CO2: 27 mmol/L (ref 22–32)
Calcium: 9.3 mg/dL (ref 8.9–10.3)
Chloride: 99 mmol/L — ABNORMAL LOW (ref 101–111)
Creatinine, Ser: 1.1 mg/dL — ABNORMAL HIGH (ref 0.44–1.00)
GFR calc Af Amer: 50 mL/min — ABNORMAL LOW (ref >60)
GFR calc non Af Amer: 43 mL/min — ABNORMAL LOW (ref >60)
Glucose, Bld: 103 mg/dL — ABNORMAL HIGH (ref 65–99)
POTASSIUM: 4.1 mmol/L (ref 3.5–5.1)
Sodium: 138 mmol/L (ref 135–145)

## 2015-07-01 LAB — PROTIME-INR
INR: 1.2 (ref 0.00–1.49)
Prothrombin Time: 15.3 seconds — ABNORMAL HIGH (ref 11.6–15.2)

## 2015-07-01 MED ORDER — FENTANYL CITRATE (PF) 100 MCG/2ML IJ SOLN
INTRAMUSCULAR | Status: AC
Start: 2015-07-01 — End: 2015-07-02
  Filled 2015-07-01: qty 2

## 2015-07-01 MED ORDER — CEFAZOLIN SODIUM-DEXTROSE 2-3 GM-% IV SOLR
2.0000 g | Freq: Once | INTRAVENOUS | Status: AC
  Administered 2015-07-01: 2 g via INTRAVENOUS
  Filled 2015-07-01: qty 50

## 2015-07-01 MED ORDER — AMLODIPINE BESYLATE 10 MG PO TABS
10.0000 mg | ORAL_TABLET | Freq: Every day | ORAL | Status: DC
  Administered 2015-07-01: 10 mg via ORAL
  Filled 2015-07-01: qty 1

## 2015-07-01 MED ORDER — CARVEDILOL 6.25 MG PO TABS
6.2500 mg | ORAL_TABLET | Freq: Two times a day (BID) | ORAL | Status: DC
  Administered 2015-07-01 – 2015-07-02 (×2): 6.25 mg via ORAL
  Filled 2015-07-01 (×2): qty 1

## 2015-07-01 MED ORDER — SODIUM CHLORIDE 0.9 % IV SOLN: INTRAVENOUS | Status: AC

## 2015-07-01 MED ORDER — CEFAZOLIN (ANCEF) 1 G IV SOLR
2.0000 g | INTRAVENOUS | Status: DC
  Filled 2015-07-01: qty 2

## 2015-07-01 MED ORDER — BISACODYL 10 MG RE SUPP
10.0000 mg | Freq: Every day | RECTAL | Status: DC
  Administered 2015-07-02: 10 mg via RECTAL
  Filled 2015-07-01: qty 1

## 2015-07-01 MED ORDER — MIDAZOLAM HCL 2 MG/2ML IJ SOLN
INTRAMUSCULAR | Status: AC | PRN
  Administered 2015-07-01: 0.5 mg via INTRAVENOUS
  Administered 2015-07-01: 1 mg via INTRAVENOUS
  Administered 2015-07-01: 0.5 mg via INTRAVENOUS

## 2015-07-01 MED ORDER — CARVEDILOL 3.125 MG PO TABS
3.1250 mg | ORAL_TABLET | Freq: Once | ORAL | Status: AC
  Administered 2015-07-01: 3.125 mg via ORAL
  Filled 2015-07-01: qty 1

## 2015-07-01 MED ORDER — CEFAZOLIN SODIUM-DEXTROSE 2-3 GM-% IV SOLR
INTRAVENOUS | Status: AC
  Administered 2015-07-01: 2 g via INTRAVENOUS
  Filled 2015-07-01: qty 50

## 2015-07-01 MED ORDER — BUPIVACAINE HCL (PF) 0.25 % IJ SOLN
INTRAMUSCULAR | Status: AC
  Filled 2015-07-01: qty 30

## 2015-07-01 MED ORDER — POLYETHYLENE GLYCOL 3350 17 G PO PACK
17.0000 g | PACK | Freq: Two times a day (BID) | ORAL | Status: DC
  Administered 2015-07-01 – 2015-07-02 (×2): 17 g via ORAL
  Filled 2015-07-01 (×2): qty 1

## 2015-07-01 MED ORDER — FENTANYL CITRATE (PF) 100 MCG/2ML IJ SOLN
INTRAMUSCULAR | Status: AC | PRN
  Administered 2015-07-01 (×2): 12.5 ug via INTRAVENOUS
  Administered 2015-07-01: 25 ug via INTRAVENOUS
  Administered 2015-07-01: 12.5 ug via INTRAVENOUS

## 2015-07-01 MED ORDER — MIDAZOLAM HCL 2 MG/2ML IJ SOLN
INTRAMUSCULAR | Status: AC
  Filled 2015-07-01: qty 2

## 2015-07-01 MED ORDER — HYDROMORPHONE HCL 1 MG/ML IJ SOLN
INTRAMUSCULAR | Status: DC
Start: 2015-07-01 — End: 2015-07-01
  Filled 2015-07-01: qty 1

## 2015-07-01 MED ORDER — IOHEXOL 300 MG/ML  SOLN
100.0000 mL | Freq: Once | INTRAMUSCULAR | Status: AC | PRN
  Administered 2015-07-01: 15 mL via INTRAVENOUS

## 2015-07-01 MED ORDER — GELATIN ABSORBABLE 12-7 MM EX MISC
CUTANEOUS | Status: AC
  Filled 2015-07-01: qty 1

## 2015-07-01 MED ORDER — TOBRAMYCIN SULFATE 1.2 G IJ SOLR
INTRAMUSCULAR | Status: AC
  Filled 2015-07-01: qty 1.2

## 2015-07-01 NOTE — Sedation Documentation (Signed)
Patient is resting comfortably. 

## 2015-07-01 NOTE — Progress Notes (Signed)
Orthopedic Tech Progress Note Patient Details:  [name] 16-Jun-1922 [id number]  Patient ID: [name], female   DOB: 1923/03/06, 82 y.o.   MRN: [id number] Called in bio-tech brace order; spoke with Ramonita Lab, Blanca Carreon 07/01/2015, 3:11 PM

## 2015-07-01 NOTE — Progress Notes (Signed)
Utilization review completed.  

## 2015-07-01 NOTE — Progress Notes (Signed)
Orthopedic Tech Progress Note Patient Details:  [name] 1922-04-30 [id number]  Patient ID: [name], female   DOB: [birth year], 82 y.o.   MRN: [id number] Called in bio-tech brace order; spoke with Ramonita Lab, Kolt Mcwhirter 07/01/2015, 11:54 AM

## 2015-07-01 NOTE — Procedures (Signed)
S/P T7 and T 8 VP

## 2015-07-01 NOTE — Sedation Documentation (Signed)
Dsg x2 to upper back intact, dry

## 2015-07-01 NOTE — H&P (Signed)
Chief Complaint: Patient was seen in consultation today for  Chief Complaint  Patient presents with  . Back Pain   at the request of Thurnell Lose   Referring Physician(s): Thurnell Lose   Supervising Physician: Estanislado Pandy  History of Present Illness: [name] is a 82 y.o. female with history of history of T-6 fracture in the past requiring kyphoplasty by Dr. Estanislado Pandy several years ago  She also has polymyalgia rheumatica on steroid taper for another month, pacemaker placement by Dr. Lovena Le several years ago for sick sinus, GERD, anemia of chronic disease, hypothyroidism, essential hypertension.  She lives in assisted living facility.  She came to the ED after an episode of coughing a few days ago after which she experienced a sharp upper back pain which is constant.  She states it is worse with movement and better with rest and pain medications  CT of the chest was performed which revealed acute T-7 and T-8 compression fractures.  We are asked to evaluate her for Kyphoplasty/Vertebroplasty.  The admitting physician did speak with Dr. Estanislado Pandy who requested hospitalist admission to prepare for Kyphoplasty/Vertebroplasty.  She denies any fever or chills, no headache or chest pain, no cough and shortness of breath, she recently was diagnosed with URI and has finished her antibiotic treatment yesterday, no abdominal pain, no dysuria, no blood in stool or urine. No focal weakness.  She is not taking blood thinners.  She is NPO.  Past Medical History  Diagnosis Date  . Hypertension   . Hypothyroidism   . Bradycardia   . Anemia   . Diverticulosis   . External hemorrhoids   . Constipation   . Macular degeneration   . Seasonal allergies   . Osteoporosis   . Degenerative joint disease   . Palpitations   . Hyperlipidemia   . GERD (gastroesophageal reflux disease)   . Pacemaker   . Thyrotoxicosis without mention of goiter or other cause, without mention of  thyrotoxic crisis or storm   . Goiter, unspecified     Past Surgical History  Procedure Laterality Date  . Thyroidectomy    . Tonsillectomy    . Colonoscopy  November 2011    External hemorrhoids, diverticulosis, anal papillae  . Insert / replace / remove pacemaker    . Vertebroplasty    . Femur im nail Left 12/29/2012    Procedure: INTRAMEDULLARY (IM) NAIL INTERTROCHANTRIC;  Surgeon: Melina Schools, MD;  Location: [place];  Service: Orthopedics;  Laterality: Left;  . Permanent pacemaker insertion N/A 06/07/2011    Procedure: PERMANENT PACEMAKER INSERTION;  Surgeon: Evans Lance, MD;  Location: [facility] CATH LAB;  Service: Cardiovascular;  Laterality: N/A;  . Colonoscopy N/A 04/01/2014    Procedure: COLONOSCOPY;  Surgeon: Rogene Houston, MD;  Location: [facility];  Service: Endoscopy;  Laterality: N/A;  930    Allergies: Tramadol and Sulfa antibiotics  Medications: Prior to Admission medications   Medication Sig Start Date End Date Taking? Authorizing Provider  acetaminophen (TYLENOL) 500 MG tablet Take 1,000 mg by mouth every 8 (eight) hours as needed for mild pain.   Yes Historical Provider, MD  ALPRAZolam Duanne Moron) 0.25 MG tablet Take 0.25 mg by mouth at bedtime as needed for sleep.   Yes Historical Provider, MD  amLODipine (NORVASC) 5 MG tablet Take 5 mg by mouth at bedtime.  08/11/14  Yes Historical Provider, MD  amoxicillin-clavulanate (AUGMENTIN) 500-125 MG tablet Take 1 tablet by mouth 2 (two) times daily. Started 03/03  for 10 days   Yes Historical Provider, MD  aspirin 325 MG tablet Take 325 mg by mouth daily.   Yes Historical Provider, MD  benzonatate (TESSALON) 200 MG capsule Take 200 mg by mouth 3 (three) times daily as needed for cough.   Yes Historical Provider, MD  Calcium Carbonate-Vitamin D (CALCIUM 600+D) 600-400 MG-UNIT tablet Take 1 tablet by mouth 2 (two) times daily.   Yes Historical Provider, MD  carbamide peroxide (DEBROX) 6.5 % otic solution Place 1 drop into both  ears daily.    Yes Historical Provider, MD  cycloSPORINE (RESTASIS) 0.05 % ophthalmic emulsion Place 1 drop into both eyes 2 (two) times daily. *Self Administration*   Yes Historical Provider, MD  Dextromethorphan-Guaifenesin (MUCINEX DM MAXIMUM STRENGTH) 60-1200 MG TB12 Take 1 tablet by mouth 2 (two) times daily. Started 03/03 for 1 week   Yes Historical Provider, MD  docusate sodium (COLACE) 100 MG capsule Take 100 mg by mouth at bedtime. For constipation 01/01/13  Yes Ripudeep Krystal Eaton, MD  fluticasone (FLONASE) 50 MCG/ACT nasal spray Place 2 sprays into the nose daily. For allergies   Yes Historical Provider, MD  hydrALAZINE (APRESOLINE) 25 MG tablet Take 25 mg by mouth 2 (two) times daily.    Yes Historical Provider, MD  HYDROcodone-acetaminophen (NORCO/VICODIN) 5-325 MG tablet Take 1 tablet by mouth every 8 (eight) hours as needed for moderate pain.   Yes Historical Provider, MD  hydrocortisone (ANUSOL-HC) 2.5 % rectal cream Place rectally 3 (three) times daily. Patient taking differently: Place 1 application rectally 3 (three) times daily.  12/06/14  Yes Maryann Mikhail, DO  Iron-FA-B Cmp-C-Biot-Probiotic (FUSION PLUS PO) Take 1 capsule by mouth daily.   Yes Historical Provider, MD  levothyroxine (SYNTHROID, LEVOTHROID) 100 MCG tablet Take 100 mcg by mouth daily before breakfast.   Yes Historical Provider, MD  lisinopril (PRINIVIL,ZESTRIL) 20 MG tablet Take 20 mg by mouth daily.    Yes Historical Provider, MD  loratadine (CLARITIN) 10 MG tablet Take 10 mg by mouth daily.   Yes Historical Provider, MD  Magnesium 250 MG TABS Take 1 tablet by mouth at bedtime.    Yes Historical Provider, MD  magnesium hydroxide (MILK OF MAGNESIA) 400 MG/5ML suspension Take 0.5 mLs by mouth as needed for mild constipation.   Yes Historical Provider, MD  Melatonin 1 MG TABS Take 1 mg by mouth at bedtime. For rest   Yes Historical Provider, MD  methocarbamol (ROBAXIN) 500 MG tablet Take 1 tablet (500 mg total) by mouth  every 6 (six) hours as needed for muscle spasms. 11/23/14  Yes Radene Gunning, NP  Multiple Vitamins-Minerals (CENTRUM SILVER) tablet Take 1 tablet by mouth 2 (two) times daily.   Yes Historical Provider, MD  multivitamin-lutein (OCUVITE-LUTEIN) CAPS capsule Take 1 capsule by mouth 2 (two) times daily.   Yes Historical Provider, MD  omeprazole (PRILOSEC) 20 MG capsule Take 20 mg by mouth daily. Reported on 06/25/2015 12/12/12  Yes Historical Provider, MD  Oxycodone HCl 10 MG TABS Take 1 tablet (10 mg total) by mouth 4 (four) times daily as needed. 06/25/15  Yes Dorie Rank, MD  polyethylene glycol [facility] / GLYCOLAX) packet Take 17 g by mouth daily.   Yes Historical Provider, MD  predniSONE (DELTASONE) 2.5 MG tablet Take 2.5 mg by mouth daily.   Yes Historical Provider, MD  Probiotic Product (ALIGN) 4 MG CAPS Take 1 capsule by mouth daily.   Yes Historical Provider, MD  sertraline (ZOLOFT) 50 MG tablet Take 50 mg  by mouth daily.   Yes Historical Provider, MD  Wheat Dextrin (BENEFIBER DRINK MIX PO) Take 10 mLs by mouth daily with breakfast. Mix 2 teaspoonfuls with water and drink daily.   Yes Historical Provider, MD  Hypertonic Nasal Wash (SINUS RINSE NA) Place 1 application into both nostrils daily.    Historical Provider, MD  loperamide (IMODIUM) 2 MG capsule Take 2 mg by mouth every 4 (four) hours as needed for diarrhea or loose stools.    Historical Provider, MD     Family History  Problem Relation Age of Onset  . Heart attack Mother     Deceased  . Cancer Father     Deceased, colon cancer age 73  . Cancer Brother     Deceased, throat and lung    Social History   Social History  . Marital Status: Widowed    Spouse Name: N/A  . Number of Children: N/A  . Years of Education: N/A   Social History Main Topics  . Smoking status: Never Smoker   . Smokeless tobacco: Never Used  . Alcohol Use: No  . Drug Use: No  . Sexual Activity: Not Asked   Other Topics Concern  . None   Social History  Narrative    Review of Systems: A 12 point ROS discussed  Review of Systems  Constitutional: Positive for activity change. Negative for fever, chills, appetite change and fatigue.  HENT: Negative.   Respiratory: Negative for cough and shortness of breath.   Cardiovascular: Negative for chest pain.  Gastrointestinal: Negative for nausea, vomiting, abdominal pain and abdominal distention.  Musculoskeletal: Positive for back pain.  Skin: Negative.   Neurological: Negative.   Psychiatric/Behavioral: Negative.     Vital Signs: BP 168/56 mmHg  Pulse 66  Temp(Src) 98.5 F (36.9 C) (Oral)  Resp 18  Ht 5\' 5"  (1.651 m)  Wt 143 lb 11.8 oz (65.2 kg)  BMI 23.92 kg/m2  SpO2 95%  Physical Exam  Constitutional: She is oriented to person, place, and time. She appears well-developed and well-nourished.  HENT:  Head: Atraumatic.  Eyes: EOM are normal.  Neck: Normal range of motion. Neck supple.  Cardiovascular: Normal rate, regular rhythm and normal heart sounds.   Pulmonary/Chest: Effort normal and breath sounds normal. She has no wheezes.  Abdominal: Soft. Bowel sounds are normal. There is no tenderness.  Musculoskeletal:  Pt lying in bed. Comfortable at the moment. Does not want to move or turn for me to examine her back due to severe pain with movement.  Neurological: She is alert and oriented to person, place, and time.  Skin: Skin is warm and dry.  Psychiatric: She has a normal mood and affect. Her behavior is normal. Judgment and thought content normal.  Vitals reviewed.   Mallampati Score:  MD Evaluation Airway: WNL Heart: WNL Abdomen: WNL Chest/ Lungs: WNL ASA  Classification: 3 Mallampati/Airway Score: Two  Imaging: Dg Chest 2 View  06/25/2015  CLINICAL DATA:  Cough for 2 weeks, thoracic spine pain from coughing, history hypertension, pacemaker EXAM: CHEST  2 VIEW COMPARISON:  12/03/2014 FINDINGS: LEFT subclavian transvenous pacemaker leads project at RIGHT atrium and  RIGHT ventricle. Enlargement of cardiac silhouette. Atherosclerotic calcification aorta. Eventration RIGHT diaphragm stable. Mediastinal contours and pulmonary vascularity normal. Emphysematous and bronchitic changes consistent with COPD. No acute infiltrate, pleural effusion or pneumothorax. Calcifications in the breasts bilaterally again noted. Bones diffusely demineralized. Posttraumatic deformity proximal LEFT humerus, old. Chronic compression fractures in the mid thoracic spine grossly stable.  IMPRESSION: Enlargement of cardiac silhouette post pacemaker. COPD changes without acute infiltrate. Electronically Signed   By: Lavonia Dana M.D.   On: 06/25/2015 12:40   Dg Thoracic Spine 2 View  06/25/2015  CLINICAL DATA:  Back pain after cough for 2-3 days, pain upper thoracic spine EXAM: THORACIC SPINE 2 VIEWS COMPARISON:  12/03/2014 FINDINGS: Mild sigmoid scoliotic curvature. Significant compression deformity at the junction of the upper to mid thoracic spine stable. Mild to moderate compression deformity 2 levels below this stable to minimally progressed. Mild compression deformity 1 level further inferior also stable. IMPRESSION: Stable thoracic spine compression deformities Electronically Signed   By: Skipper Cliche M.D.   On: 06/25/2015 13:46   Ct Chest Wo Contrast  06/30/2015  CLINICAL DATA:  Chest pain around ribcage and upper back pain for the past week EXAM: CT CHEST WITHOUT CONTRAST TECHNIQUE: Multidetector CT imaging of the chest was performed following the standard protocol without IV contrast. COMPARISON:  06/25/2015 FINDINGS: Mediastinum/Nodes: Cardiac pacer noted. Trace pericardial effusion. Mild cardiac enlargement. Calcification of the thoracic aorta. No significant hilar or mediastinal adenopathy. Lungs/Pleura: Trace and possibly loculated right pleural effusion. 4 mm pulmonary nodule in the inferior right upper lobe based along the minor fissure. 3 mm pulmonary nodule lateral right lower lobe  and 4 mm pulmonary nodule anterior inferior right middle lobe. Mild discoid atelectasis right lower lobe 4 mm pulmonary nodule in the lingula image number 27. Left lung otherwise clear. Upper abdomen: No acute findings Musculoskeletal: 50% T8 compression deformity with no retropulsion. Approximate 25% T7 compression deformity with no retropulsion. Over 50% T6 compression deformity with no retropulsion. This level is status post kyphoplasty. These levels appear unchanged from 06/25/2015. 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 Old anterior right fifth sixth and seventh rib fractures. No acute rib fractures identified. IMPRESSION: Multiple bilateral tiny pulmonary nodules. If the patient is at high risk for bronchogenic carcinoma, follow-up chest CT at 1 year is recommended. If the patient is at low risk, no follow-up is needed. This recommendation follows the consensus statement: Guidelines for Management of Small Pulmonary Nodules Detected on CT Scans: A Statement from the [place] as published in Radiology 2005; 237:395-400. Old right rib fractures. Trace loculated right pleural effusion or chronic pleural thickening possibly related. Compression deformities thoracic spine at three consecutive levels appear without acute features and stable from 06/25/15, with the most cephalad and severe status post kyphoplasty. Electronically Signed   By: Skipper Cliche M.D.   On: 06/30/2015 16:53    Labs:  CBC:  Recent Labs  12/15/14 06/25/15 1350 06/30/15 1630 07/01/15 0550  WBC 6.5 11.0* 5.6 5.8  HGB 9.0* 12.3 10.4* 9.8*  HCT 27* 36.1 29.8* 29.9*  PLT 412* 385 316 330    COAGS:  Recent Labs  11/22/14 0507 06/25/15 1350 07/01/15 0550  INR 1.22 1.02 1.20    BMP:  Recent Labs  12/06/14 0510 12/15/14 06/25/15 1350 06/30/15 1630 07/01/15 0550  NA 133* 130* 136 135 138  K 4.2 5.2 4.5 5.0 4.1  CL 102  --  98* 98* 99*  CO2 25  --  27 28 27     GLUCOSE 91  --  103* 109* 103*  BUN 12 26* 13 15 16   CALCIUM 8.4*  --  10.1 9.4 9.3  CREATININE 0.80 1.1 0.84 1.07* 1.10*  GFRNONAA >60  --  59* 44* 43*  GFRAA >60  --  >60 51* 50*  LIVER FUNCTION TESTS:  Recent Labs  11/22/14 0507 12/03/14 1257 12/15/14 06/25/15 1350  BILITOT 0.6 0.8  --  0.9  AST 20 22 13  35  ALT 19 17 10 16   ALKPHOS 51 103 133* 69  PROT 5.4* 6.4*  --  7.1  ALBUMIN 3.5 3.6  --  4.8    TUMOR MARKERS: No results for input(s): AFPTM, CEA, CA199, CHROMGRNA in the last 8760 hours.  Assessment and Plan:  Acute compression fractures of T-7 and T-8  Will proceed with kyphoplasty/vertebroplasty by Dr. Estanislado Pandy today.  Risks and Benefits discussed with the patient including, but not limited to education regarding the natural healing process of compression fractures without intervention, bleeding, infection, cement migration which may cause spinal cord damage, paralysis, pulmonary embolism or even death.  All of the patient's questions were answered, patient is agreeable to proceed. Consent signed and in chart.  Thank you for this interesting consult.  I greatly enjoyed meeting [name] and look forward to participating in their care.  A copy of this report was sent to the requesting provider on this date.  Electronically Signed: Murrell Redden PA-C 07/01/2015, 10:58 AM   I spent a total of 40 Minutes in face to face in clinical consultation, greater than 50% of which was counseling/coordinating care for kyphoplasty

## 2015-07-01 NOTE — Progress Notes (Addendum)
Patient Demographics:    [name], is a 82 y.o. female, DOB - 24-Nov-1922, [id number]  Admit date - 06/30/2015   Admitting Physician Thurnell Lose, MD  Outpatient Primary MD for the patient is Wende Neighbors, MD  LOS - 1   Chief Complaint  Patient presents with  . Back Pain        Subjective:    [name] today has, No headache, No chest pain, No abdominal pain - No Nausea, No new weakness tingling or numbness, No Cough - SOB. Continues to have nonradiating upper back pain.   Assessment  & Plan :    1. Acute T-7 and T-8 spine fracture.  Patient is in excruciating pain, failed outpatient pain control, continue pain control, gentle IV fluids, TLSO brace, IR has been consulted for Kyphoplasty on 07-01-15. We will start PT after procedure.  2. History of sick sinus with pacemaker placement. Stable.  3. Essential hypertension. Continue home dose hydralazine , increased Norvasc, added Coreg and as needed IV hydralazine. Hold ACE inhibitor prior to surgery and with borderline creatinine.  4. Anxiety. Continue home dose Xanax.  5. History of polymyalgia rheumatica. Continue home dose prednisone which is 2.5 mg daily.  6. Osteoporosis. Continue calcium and vitamin D supplements.  7. GERD. On PPI.  8. Chronic constipation. Continue bowel regimen.  9. Hypothyroidism . Continue home dose Synthroid.   Code Status : Full  Family Communication  : Son on the day of admission  Disposition Plan  : To be decided  Consults  : Interventional radiology  Procedures  :   CT T-spine confirming the spine fractures  T-spine kyphoplasty scheduled for 07/01/2015  DVT Prophylaxis  :   SCDs    Lab Results  Component Value Date   PLT 330 07/01/2015    Inpatient Medications  Scheduled Meds: .  amLODipine  10 mg Oral QHS  . aspirin  325 mg Oral Daily  . bisacodyl  10 mg Rectal Daily  . calcium-vitamin D  1 tablet Oral BID  . carvedilol  3.125 mg Oral Once  . carvedilol  6.25 mg Oral BID WC  . cycloSPORINE  1 drop Both Eyes BID  . docusate sodium  100 mg Oral QHS  . hydrALAZINE  25 mg Oral BID  . levothyroxine  100 mcg Oral QAC breakfast  . magnesium oxide  200 mg Oral QHS  . pantoprazole  40 mg Oral Daily  . polyethylene glycol  17 g Oral BID  . predniSONE  2.5 mg Oral Daily  . sertraline  50 mg Oral Daily   Continuous Infusions: . sodium chloride 75 mL/hr at 06/30/15 1945   PRN Meds:.ALPRAZolam, alum & mag hydroxide-simeth, hydrALAZINE, HYDROcodone-acetaminophen, methocarbamol, morphine injection, ondansetron **OR** ondansetron (ZOFRAN) IV  Antibiotics  :    Anti-infectives    None        Objective:   Filed Vitals:   06/30/15 2115 06/30/15 2145 07/01/15 0504 07/01/15 0858  BP: 126/54 124/44 162/56 168/56  Pulse: 81 74 71 66  Temp:  98 F (36.7 C) 98.5 F (36.9 C)   TempSrc:  Oral Oral   Resp: 13 18 18    Height:  5\' 5"  (1.651 m)    Weight:  68.2 kg (  150 lb 5.7 oz) 65.2 kg (143 lb 11.8 oz)   SpO2: 97% 98% 95%     Wt Readings from Last 3 Encounters:  07/01/15 65.2 kg (143 lb 11.8 oz)  12/03/14 65.772 kg (145 lb)  11/21/14 67.8 kg (149 lb 7.6 oz)     Intake/Output Summary (Last 24 hours) at 07/01/15 1029 Last data filed at 07/01/15 0600  Gross per 24 hour  Intake    645 ml  Output      0 ml  Net    645 ml     Physical Exam  Awake Alert, Oriented X 3, No new F.N deficits, Normal affect [place].AT,PERRAL Supple Neck,No JVD, No cervical lymphadenopathy appriciated.  Symmetrical Chest wall movement, Good air movement bilaterally, CTAB RRR,No Gallops,Rubs or new Murmurs, No Parasternal Heave +ve B.Sounds, Abd Soft, No tenderness, No organomegaly appriciated, No rebound - guarding or rigidity. No Cyanosis, Clubbing or edema, No new Rash or bruise        Data Review:   Micro Results No results found for this or any previous visit (from the past 240 hour(s)).  Radiology Reports Dg Chest 2 View  06/25/2015  CLINICAL DATA:  Cough for 2 weeks, thoracic spine pain from coughing, history hypertension, pacemaker EXAM: CHEST  2 VIEW COMPARISON:  12/03/2014 FINDINGS: LEFT subclavian transvenous pacemaker leads project at RIGHT atrium and RIGHT ventricle. Enlargement of cardiac silhouette. Atherosclerotic calcification aorta. Eventration RIGHT diaphragm stable. Mediastinal contours and pulmonary vascularity normal. Emphysematous and bronchitic changes consistent with COPD. No acute infiltrate, pleural effusion or pneumothorax. Calcifications in the breasts bilaterally again noted. Bones diffusely demineralized. Posttraumatic deformity proximal LEFT humerus, old. Chronic compression fractures in the mid thoracic spine grossly stable. IMPRESSION: Enlargement of cardiac silhouette post pacemaker. COPD changes without acute infiltrate. Electronically Signed   By: Lavonia Dana M.D.   On: 06/25/2015 12:40   Dg Thoracic Spine 2 View  06/25/2015  CLINICAL DATA:  Back pain after cough for 2-3 days, pain upper thoracic spine EXAM: THORACIC SPINE 2 VIEWS COMPARISON:  12/03/2014 FINDINGS: Mild sigmoid scoliotic curvature. Significant compression deformity at the junction of the upper to mid thoracic spine stable. Mild to moderate compression deformity 2 levels below this stable to minimally progressed. Mild compression deformity 1 level further inferior also stable. IMPRESSION: Stable thoracic spine compression deformities Electronically Signed   By: Skipper Cliche M.D.   On: 06/25/2015 13:46   Ct Chest Wo Contrast  06/30/2015  CLINICAL DATA:  Chest pain around ribcage and upper back pain for the past week EXAM: CT CHEST WITHOUT CONTRAST TECHNIQUE: Multidetector CT imaging of the chest was performed following the standard protocol without IV contrast. COMPARISON:   06/25/2015 FINDINGS: Mediastinum/Nodes: Cardiac pacer noted. Trace pericardial effusion. Mild cardiac enlargement. Calcification of the thoracic aorta. No significant hilar or mediastinal adenopathy. Lungs/Pleura: Trace and possibly loculated right pleural effusion. 4 mm pulmonary nodule in the inferior right upper lobe based along the minor fissure. 3 mm pulmonary nodule lateral right lower lobe and 4 mm pulmonary nodule anterior inferior right middle lobe. Mild discoid atelectasis right lower lobe 4 mm pulmonary nodule in the lingula image number 27. Left lung otherwise clear. Upper abdomen: No acute findings Musculoskeletal: 50% T8 compression deformity with no retropulsion. Approximate 25% T7 compression deformity with no retropulsion. Over 50% T6 compression deformity with no retropulsion. This level is status post kyphoplasty. These levels appear unchanged from 06/25/2015. 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 Old anterior  right fifth sixth and seventh rib fractures. No acute rib fractures identified. IMPRESSION: Multiple bilateral tiny pulmonary nodules. If the patient is at high risk for bronchogenic carcinoma, follow-up chest CT at 1 year is recommended. If the patient is at low risk, no follow-up is needed. This recommendation follows the consensus statement: Guidelines for Management of Small Pulmonary Nodules Detected on CT Scans: A Statement from the [place] as published in Radiology 2005; 237:395-400. Old right rib fractures. Trace loculated right pleural effusion or chronic pleural thickening possibly related. Compression deformities thoracic spine at three consecutive levels appear without acute features and stable from 06/25/15, with the most cephalad and severe status post kyphoplasty. Electronically Signed   By: Skipper Cliche M.D.   On: 06/30/2015 16:53     CBC  Recent Labs Lab 06/25/15 1350 06/30/15 1630 07/01/15 0550  WBC 11.0*  5.6 5.8  HGB 12.3 10.4* 9.8*  HCT 36.1 29.8* 29.9*  PLT 385 316 330  MCV 98.9 97.7 98.4  MCH 33.7 34.1* 32.2  MCHC 34.1 34.9 32.8  RDW 15.3 15.6* 15.7*  LYMPHSABS  --  1.5  --   MONOABS  --  0.6  --   EOSABS  --  0.0  --   BASOSABS  --  0.1  --     Chemistries   Recent Labs Lab 06/25/15 1350 06/30/15 1630 07/01/15 0550  NA 136 135 138  K 4.5 5.0 4.1  CL 98* 98* 99*  CO2 27 28 27   GLUCOSE 103* 109* 103*  BUN 13 15 16   CREATININE 0.84 1.07* 1.10*  CALCIUM 10.1 9.4 9.3  AST 35  --   --   ALT 16  --   --   ALKPHOS 69  --   --   BILITOT 0.9  --   --    ------------------------------------------------------------------------------------------------------------------ No results for input(s): CHOL, HDL, LDLCALC, TRIG, CHOLHDL, LDLDIRECT in the last 72 hours.  No results found for: HGBA1C ------------------------------------------------------------------------------------------------------------------ No results for input(s): TSH, T4TOTAL, T3FREE, THYROIDAB in the last 72 hours.  Invalid input(s): FREET3 ------------------------------------------------------------------------------------------------------------------ No results for input(s): VITAMINB12, FOLATE, FERRITIN, TIBC, IRON, RETICCTPCT in the last 72 hours.  Coagulation profile  Recent Labs Lab 06/25/15 1350 07/01/15 0550  INR 1.02 1.20    No results for input(s): DDIMER in the last 72 hours.  Cardiac Enzymes  Recent Labs Lab 06/25/15 1350 06/30/15 1630  TROPONINI <0.03 <0.03   ------------------------------------------------------------------------------------------------------------------ No results found for: BNP  Time Spent in minutes 35   Nivea Wojdyla K M.D on 07/01/2015 at 10:29 AM  Between 7am to 7pm - Pager - [phone]  After 7pm go to [email or web address] - password [facility]  [facility] -  Office  [phone]

## 2015-07-02 LAB — CUP PACEART REMOTE DEVICE CHECK
Battery Remaining Longevity: 106 mo
Battery Remaining Percentage: 91 %
Brady Statistic AP VP Percent: 1 %
Brady Statistic AP VS Percent: 24 %
Brady Statistic RV Percent Paced: 1 %
Date Time Interrogation Session: 20170207125332
Implantable Lead Implant Date: 20130214
Implantable Lead Location: 753860
Lead Channel Impedance Value: 390 Ohm
Lead Channel Pacing Threshold Amplitude: 1 V
Lead Channel Pacing Threshold Pulse Width: 0.5 ms
Lead Channel Sensing Intrinsic Amplitude: 3.7 mV
Lead Channel Setting Pacing Amplitude: 2.5 V
Lead Channel Setting Pacing Pulse Width: 0.5 ms
Lead Channel Setting Sensing Sensitivity: 2 mV
MDC IDC LEAD IMPLANT DT: 20130214
MDC IDC LEAD LOCATION: 753859
MDC IDC MSMT BATTERY VOLTAGE: 2.95 V
MDC IDC MSMT LEADCHNL RA PACING THRESHOLD AMPLITUDE: 0.75 V
MDC IDC MSMT LEADCHNL RA PACING THRESHOLD PULSEWIDTH: 0.5 ms
MDC IDC MSMT LEADCHNL RV IMPEDANCE VALUE: 400 Ohm
MDC IDC MSMT LEADCHNL RV SENSING INTR AMPL: 12 mV
MDC IDC SET LEADCHNL RA PACING AMPLITUDE: 2 V
MDC IDC STAT BRADY AS VP PERCENT: 1 %
MDC IDC STAT BRADY AS VS PERCENT: 75 %
MDC IDC STAT BRADY RA PERCENT PACED: 24 %
Pulse Gen Model: 2210
Pulse Gen Serial Number: 7316836

## 2015-07-02 MED ORDER — DIPHENHYDRAMINE HCL 12.5 MG/5ML PO SYRP: 12.5000 mg | ORAL_SOLUTION | Freq: Four times a day (QID) | ORAL | Status: DC | PRN

## 2015-07-02 MED ORDER — CARVEDILOL 6.25 MG PO TABS: 6.2500 mg | ORAL_TABLET | Freq: Two times a day (BID) | ORAL | Status: AC

## 2015-07-02 NOTE — Progress Notes (Signed)
Patient refused to take TLSO brace home with her at discharge.

## 2015-07-02 NOTE — Evaluation (Signed)
Physical Therapy Evaluation Patient Details Name: [name] MRN: [id number] DOB: [birth year] Today's Date: 07/02/2015   History of Present Illness  82 y.o. female with history of history of T-6 fracture in the past requiring kyphoplasty   Clinical Impression  Patient demonstrates deficits in functional mobility as indicated below. Will need continued skilled PT to address deficits and maximize function. Patient for d/c today, will defer to HHPT.    Follow Up Recommendations Home health PT;Supervision - Intermittent    Equipment Recommendations  None recommended by PT    Recommendations for Other Services       Precautions / Restrictions Precautions Precautions: Back Precaution Booklet Issued: Yes (comment) Precaution Comments: educated and reviewed technique at length with patient Restrictions Weight Bearing Restrictions: No      Mobility  Bed Mobility Overal bed mobility: Needs Assistance Bed Mobility: Rolling;Sidelying to Sit;Sit to Sidelying Rolling: Supervision Sidelying to sit: Supervision     Sit to sidelying: Supervision General bed mobility comments: Vcs for technique and positioning, patient tolerated well  Transfers Overall transfer level: Needs assistance Equipment used: Rolling walker (2 wheeled) Transfers: Sit to/from Stand Sit to Stand: Min guard         General transfer comment: Min guard for stability, cues for technique and positioning for safety  Ambulation/Gait Ambulation/Gait assistance: Min guard Ambulation Distance (Feet): 40 Feet Assistive device: Rolling walker (2 wheeled) Gait Pattern/deviations: Step-through pattern;Decreased stride length;Trunk flexed;Shuffle Gait velocity: decreased Gait velocity interpretation: <1.8 ft/sec, indicative of risk for recurrent falls General Gait Details: slow and cautious with gait. VCs for positioning with use of RW  Stairs            Wheelchair Mobility    Modified Rankin (Stroke  Patients Only)       Balance Overall balance assessment: History of Falls                                           Pertinent Vitals/Pain Pain Assessment: 0-10 Pain Score: 3  Pain Location: back Pain Descriptors / Indicators: Sore Pain Intervention(s): Limited activity within patient's tolerance;Monitored during session;Repositioned    Home Living Family/patient expects to be discharged to:: Assisted living               Home Equipment: Walker - 2 wheels;Cane - quad      Prior Function Level of Independence: Needs assistance   Gait / Transfers Assistance Needed: limited amb recently d/t pain, transfers with assist from ALF staff  ADL's / Homemaking Assistance Needed: assist from [facility] staff with bathing and dressing. she had been getting into wheelchair and going into bathroom to use commode but more recently with increased pain, had to use BSC near bed.        Hand Dominance   Dominant Hand: Right    Extremity/Trunk Assessment   Upper Extremity Assessment: Generalized weakness           Lower Extremity Assessment: Generalized weakness      Cervical / Trunk Assessment: Kyphotic  Communication   Communication: HOH  Cognition Arousal/Alertness: Awake/alert Behavior During Therapy: WFL for tasks assessed/performed Overall Cognitive Status: Within Functional Limits for tasks assessed                      General Comments      Exercises        Assessment/Plan  PT Assessment All further PT needs can be met in the next venue of care  PT Diagnosis Difficulty walking;Abnormality of gait;Generalized weakness;Acute pain   PT Problem List Decreased strength;Decreased activity tolerance;Decreased balance;Decreased mobility;Pain  PT Treatment Interventions     PT Goals (Current goals can be found in the Care Plan section) Acute Rehab PT Goals Patient Stated Goal: to go home PT Goal Formulation: With patient/family Time For  Goal Achievement: 07/16/15 Potential to Achieve Goals: Good    Frequency     Barriers to discharge        Co-evaluation               End of Session Equipment Utilized During Treatment: Gait belt Activity Tolerance: Patient tolerated treatment well Patient left: in bed;with call bell/phone within reach;with family/visitor present Nurse Communication: Mobility status         Time: 9373-4287 PT Time Calculation (min) (ACUTE ONLY): 24 min   Charges:   PT Evaluation $PT Eval Moderate Complexity: 1 Procedure PT Treatments $Self Care/Home Management: 8-22   PT G CodesDuncan Dull 2015-07-31, 12:24 PM Alben Deeds, [place] DPT  [phone]

## 2015-07-02 NOTE — Care Management Note (Addendum)
Case Management Note  Patient Details  Name: AHLINA SHUKLA MRN: [id number] Date of Birth: Aug 09, 1922  Subjective/Objective:                  Closed wedge fracture of thoracic vertebra, unspecified thoracic vertebral level, initial encounter [facility]) [id number]  Action/Plan: Discharge planning Expected Discharge Date:  07/02/15               Expected Discharge Plan:  Assisted Living / Rest Home  In-House Referral:  Clinical Social Work  Discharge planning Services  CM Consult  Post Acute Care Choice:  NA Choice offered to:     DME Arranged:    DME Agency:  NA  HH Arranged:  RN, PT, Nurse's Aide, Patient Refused [place] Agency:  NA  Status of Service:  Completed, signed off  Medicare Important Message Given:    Date Medicare IM Given:    Medicare IM give by:    Date Additional Medicare IM Given:    Additional Medicare Important Message give by:     If discussed at [place] of Stay Meetings, dates discussed:    Additional Comments: CM spoke with Candace Gallus, pt's son who states pt lives a ALF.  Pt's HH needs are assessed and taken care of ALF caretakers; politely declines this CM to set up [facility] services.  Leyre Schlanger also states he has his mother's rolling walker in the car with him and will provide transportation of pt to her ALF upon discharge. No other CM needs wer communicated.  15:00 Cm receives call from ALF and [name] NOW wishes to have [facility] services. CM spoke with Hoyle Sauer and has faxed facesheet, H&P, Orders, face to face, and DC summary.  No other CM needs were communicated. Dellie Catholic, RN 07/02/2015, 11:20 AM

## 2015-07-02 NOTE — Progress Notes (Signed)
We got patient up OOB to the [facility] but she refuses to put on the TLSO brace as ordered.  As per the patient "my doctor told me I do not have to wear it. I do not want to put it on".  Patient moved very well to [facility] with one assist to void.

## 2015-07-02 NOTE — Discharge Instructions (Signed)
1.No stooping,bending or lifting more than 10 lbs for 2 weeks. 2.Use walker to ambulate for 2 weeks . 3.RTC in 2 weeks  Follow with Primary MD Wende Neighbors, MD in 7 days   Get CBC, CMP, 2 view Chest X ray checked  by Primary MD next visit.    Activity: No stooping,bending or lifting more than 10 lbs for 2 weeks. Use walker to ambulate for 2 weeks    Disposition ALF     Diet:   Heart Healthy  .  For Heart failure patients - Check your Weight same time everyday, if you gain over 2 pounds, or you develop in leg swelling, experience more shortness of breath or chest pain, call your Primary MD immediately. Follow Cardiac Low Salt Diet and 1.5 lit/day fluid restriction.   On your next visit with your primary care physician please Get Medicines reviewed and adjusted.   Please request your Prim.MD to go over all Hospital Tests and Procedure/Radiological results at the follow up, please get all Hospital records sent to your Prim MD by signing hospital release before you go home.   If you experience worsening of your admission symptoms, develop shortness of breath, life threatening emergency, suicidal or homicidal thoughts you must seek medical attention immediately by calling 911 or calling your MD immediately  if symptoms less severe.  You Must read complete instructions/literature along with all the possible adverse reactions/side effects for all the Medicines you take and that have been prescribed to you. Take any new Medicines after you have completely understood and accpet all the possible adverse reactions/side effects.   Do not drive, operating heavy machinery, perform activities at heights, swimming or participation in water activities or provide baby sitting services if your were admitted for syncope or siezures until you have seen by Primary MD or a Neurologist and advised to do so again.  Do not drive when taking Pain medications.    Do not take more than prescribed Pain, Sleep and  Anxiety Medications  Special Instructions: If you have smoked or chewed Tobacco  in the last 2 yrs please stop smoking, stop any regular Alcohol  and or any Recreational drug use.  Wear Seat belts while driving.   Please note  You were cared for by a hospitalist during your hospital stay. If you have any questions about your discharge medications or the care you received while you were in the hospital after you are discharged, you can call the unit and asked to speak with the hospitalist on call if the hospitalist that took care of you is not available. Once you are discharged, your primary care physician will handle any further medical issues. Please note that NO REFILLS for any discharge medications will be authorized once you are discharged, as it is imperative that you return to your primary care physician (or establish a relationship with a primary care physician if you do not have one) for your aftercare needs so that they can reassess your need for medications and monitor your lab values.

## 2015-07-02 NOTE — Progress Notes (Signed)
Patient mentioned that she is having some itching in certain spots on her abdomen. Noticed that it was where her underwear line is but she insisted that it was not her underwear.  No redness except for underwear line.  MD, please consider some Benadryl in case it is a medication causing the itching. Thank you.

## 2015-07-02 NOTE — Progress Notes (Signed)
Referring Physician(s): Dr Lala Lund  Supervising Physician: Dr Luanne Bras  Chief Complaint:  Back pain  Subjective       Expand All Collapse All   S/P T7 and T 8 VP           Pt states pain is less today Moving better DC today per chart  Allergies: Tramadol and Sulfa antibiotics  Medications: Prior to Admission medications   Medication Sig Start Date End Date Taking? Authorizing Provider  acetaminophen (TYLENOL) 500 MG tablet Take 1,000 mg by mouth every 8 (eight) hours as needed for mild pain.   Yes Historical Provider, MD  ALPRAZolam Duanne Moron) 0.25 MG tablet Take 0.25 mg by mouth at bedtime as needed for sleep.   Yes Historical Provider, MD  amLODipine (NORVASC) 5 MG tablet Take 5 mg by mouth at bedtime.  08/11/14  Yes Historical Provider, MD  amoxicillin-clavulanate (AUGMENTIN) 500-125 MG tablet Take 1 tablet by mouth 2 (two) times daily. Started 03/03 for 10 days   Yes Historical Provider, MD  aspirin 325 MG tablet Take 325 mg by mouth daily.   Yes Historical Provider, MD  benzonatate (TESSALON) 200 MG capsule Take 200 mg by mouth 3 (three) times daily as needed for cough.   Yes Historical Provider, MD  Calcium Carbonate-Vitamin D (CALCIUM 600+D) 600-400 MG-UNIT tablet Take 1 tablet by mouth 2 (two) times daily.   Yes Historical Provider, MD  carbamide peroxide (DEBROX) 6.5 % otic solution Place 1 drop into both ears daily.    Yes Historical Provider, MD  cycloSPORINE (RESTASIS) 0.05 % ophthalmic emulsion Place 1 drop into both eyes 2 (two) times daily. *Self Administration*   Yes Historical Provider, MD  Dextromethorphan-Guaifenesin (MUCINEX DM MAXIMUM STRENGTH) 60-1200 MG TB12 Take 1 tablet by mouth 2 (two) times daily. Started 03/03 for 1 week   Yes Historical Provider, MD  docusate sodium (COLACE) 100 MG capsule Take 100 mg by mouth at bedtime. For constipation 01/01/13  Yes Ripudeep Krystal Eaton, MD  fluticasone (FLONASE) 50 MCG/ACT nasal spray Place 2  sprays into the nose daily. For allergies   Yes Historical Provider, MD  hydrALAZINE (APRESOLINE) 25 MG tablet Take 25 mg by mouth 2 (two) times daily.    Yes Historical Provider, MD  HYDROcodone-acetaminophen (NORCO/VICODIN) 5-325 MG tablet Take 1 tablet by mouth every 8 (eight) hours as needed for moderate pain.   Yes Historical Provider, MD  hydrocortisone (ANUSOL-HC) 2.5 % rectal cream Place rectally 3 (three) times daily. Patient taking differently: Place 1 application rectally 3 (three) times daily.  12/06/14  Yes Maryann Mikhail, DO  Iron-FA-B Cmp-C-Biot-Probiotic (FUSION PLUS PO) Take 1 capsule by mouth daily.   Yes Historical Provider, MD  levothyroxine (SYNTHROID, LEVOTHROID) 100 MCG tablet Take 100 mcg by mouth daily before breakfast.   Yes Historical Provider, MD  lisinopril (PRINIVIL,ZESTRIL) 20 MG tablet Take 20 mg by mouth daily.    Yes Historical Provider, MD  loratadine (CLARITIN) 10 MG tablet Take 10 mg by mouth daily.   Yes Historical Provider, MD  Magnesium 250 MG TABS Take 1 tablet by mouth at bedtime.    Yes Historical Provider, MD  magnesium hydroxide (MILK OF MAGNESIA) 400 MG/5ML suspension Take 0.5 mLs by mouth as needed for mild constipation.   Yes Historical Provider, MD  Melatonin 1 MG TABS Take 1 mg by mouth at bedtime. For rest   Yes Historical Provider, MD  methocarbamol (ROBAXIN) 500 MG tablet Take 1 tablet (500 mg total) by mouth  every 6 (six) hours as needed for muscle spasms. 11/23/14  Yes Radene Gunning, NP  Multiple Vitamins-Minerals (CENTRUM SILVER) tablet Take 1 tablet by mouth 2 (two) times daily.   Yes Historical Provider, MD  multivitamin-lutein (OCUVITE-LUTEIN) CAPS capsule Take 1 capsule by mouth 2 (two) times daily.   Yes Historical Provider, MD  omeprazole (PRILOSEC) 20 MG capsule Take 20 mg by mouth daily. Reported on 06/25/2015 12/12/12  Yes Historical Provider, MD  Oxycodone HCl 10 MG TABS Take 1 tablet (10 mg total) by mouth 4 (four) times daily as needed.  06/25/15  Yes Dorie Rank, MD  polyethylene glycol [facility] / GLYCOLAX) packet Take 17 g by mouth daily.   Yes Historical Provider, MD  predniSONE (DELTASONE) 2.5 MG tablet Take 2.5 mg by mouth daily.   Yes Historical Provider, MD  Probiotic Product (ALIGN) 4 MG CAPS Take 1 capsule by mouth daily.   Yes Historical Provider, MD  sertraline (ZOLOFT) 50 MG tablet Take 50 mg by mouth daily.   Yes Historical Provider, MD  Wheat Dextrin (BENEFIBER DRINK MIX PO) Take 10 mLs by mouth daily with breakfast. Mix 2 teaspoonfuls with water and drink daily.   Yes Historical Provider, MD  carvedilol (COREG) 6.25 MG tablet Take 1 tablet (6.25 mg total) by mouth 2 (two) times daily with a meal. 07/02/15   Thurnell Lose, MD  diphenhydrAMINE (BENYLIN) 12.5 MG/5ML syrup Take 5 mLs (12.5 mg total) by mouth 4 (four) times daily as needed for itching or allergies. 07/02/15   Thurnell Lose, MD  Hypertonic Nasal Wash (SINUS RINSE NA) Place 1 application into both nostrils daily.    Historical Provider, MD  loperamide (IMODIUM) 2 MG capsule Take 2 mg by mouth every 4 (four) hours as needed for diarrhea or loose stools.    Historical Provider, MD     Vital Signs: BP 120/55 mmHg  Pulse 66  Temp(Src) 98.4 F (36.9 C) (Oral)  Resp 16  Ht 5\' 5"  (1.651 m)  Wt 147 lb 14.9 oz (67.1 kg)  BMI 24.62 kg/m2  SpO2 93%  Physical Exam  Skin: Skin is warm.  Skin sites of T7 and T8 VP are clean and dry NT no hematoma Moving all 4s  Nursing note and vitals reviewed.   Imaging: Ct Chest Wo Contrast  06/30/2015  CLINICAL DATA:  Chest pain around ribcage and upper back pain for the past week EXAM: CT CHEST WITHOUT CONTRAST TECHNIQUE: Multidetector CT imaging of the chest was performed following the standard protocol without IV contrast. COMPARISON:  06/25/2015 FINDINGS: Mediastinum/Nodes: Cardiac pacer noted. Trace pericardial effusion. Mild cardiac enlargement. Calcification of the thoracic aorta. No significant hilar or  mediastinal adenopathy. Lungs/Pleura: Trace and possibly loculated right pleural effusion. 4 mm pulmonary nodule in the inferior right upper lobe based along the minor fissure. 3 mm pulmonary nodule lateral right lower lobe and 4 mm pulmonary nodule anterior inferior right middle lobe. Mild discoid atelectasis right lower lobe 4 mm pulmonary nodule in the lingula image number 27. Left lung otherwise clear. Upper abdomen: No acute findings Musculoskeletal: 50% T8 compression deformity with no retropulsion. Approximate 25% T7 compression deformity with no retropulsion. Over 50% T6 compression deformity with no retropulsion. This level is status post kyphoplasty. These levels appear unchanged from 06/25/2015. 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 Old anterior right fifth sixth and seventh rib fractures. No acute rib fractures identified. IMPRESSION: Multiple bilateral tiny pulmonary nodules. If the patient  is at high risk for bronchogenic carcinoma, follow-up chest CT at 1 year is recommended. If the patient is at low risk, no follow-up is needed. This recommendation follows the consensus statement: Guidelines for Management of Small Pulmonary Nodules Detected on CT Scans: A Statement from the [place] as published in Radiology 2005; 237:395-400. Old right rib fractures. Trace loculated right pleural effusion or chronic pleural thickening possibly related. Compression deformities thoracic spine at three consecutive levels appear without acute features and stable from 06/25/15, with the most cephalad and severe status post kyphoplasty. Electronically Signed   By: Skipper Cliche M.D.   On: 06/30/2015 16:53    Labs:  CBC:  Recent Labs  12/15/14 06/25/15 1350 06/30/15 1630 07/01/15 0550  WBC 6.5 11.0* 5.6 5.8  HGB 9.0* 12.3 10.4* 9.8*  HCT 27* 36.1 29.8* 29.9*  PLT 412* 385 316 330    COAGS:  Recent Labs  11/22/14 0507 06/25/15 1350  07/01/15 0550  INR 1.22 1.02 1.20    BMP:  Recent Labs  12/06/14 0510 12/15/14 06/25/15 1350 06/30/15 1630 07/01/15 0550  NA 133* 130* 136 135 138  K 4.2 5.2 4.5 5.0 4.1  CL 102  --  98* 98* 99*  CO2 25  --  27 28 27   GLUCOSE 91  --  103* 109* 103*  BUN 12 26* 13 15 16   CALCIUM 8.4*  --  10.1 9.4 9.3  CREATININE 0.80 1.1 0.84 1.07* 1.10*  GFRNONAA >60  --  59* 44* 43*  GFRAA >60  --  >60 51* 50*    LIVER FUNCTION TESTS:  Recent Labs  11/22/14 0507 12/03/14 1257 12/15/14 06/25/15 1350  BILITOT 0.6 0.8  --  0.9  AST 20 22 13  35  ALT 19 17 10 16   ALKPHOS 51 103 133* 69  PROT 5.4* 6.4*  --  7.1  ALBUMIN 3.5 3.6  --  4.8    Assessment and Plan:  T7/T8 VP 3/10 Doing well Plan to see Dr Estanislado Pandy 2 weeks for follow up exam She will hear from scheduler for time and date  Electronically Signed: Olliver Boyadjian A 07/02/2015, 11:35 AM   I spent a total of 15 Minutes at the the patient's bedside AND on the patient's hospital floor or unit, greater than 50% of which was counseling/coordinating care for T7/T8 VP

## 2015-07-02 NOTE — Discharge Summary (Signed)
[name], is a 82 y.o. female  DOB 14-Aug-1922  MRN [id number].  Admission date:  06/30/2015  Admitting Physician  Thurnell Lose, MD  Discharge Date:  07/02/2015   Primary MD  Wende Neighbors, MD  Recommendations for primary care physician for things to follow:   Check blood pressure, CBC, BMP in 7-10 days. Close outpatient follow-up with IR   Admission Diagnosis  Closed wedge fracture of thoracic vertebra, unspecified thoracic vertebral level, initial encounter ([place]) [S22.000A]   Discharge Diagnosis  Closed wedge fracture of thoracic vertebra, unspecified thoracic vertebral level, initial encounter ([place]) [S22.000A]     Principal Problem:   Thoracic spine fracture (HCC) Active Problems:   HYPERLIPIDEMIA, FAMILIAL   Hypertension   Hypothyroidism   Pacemaker      Past Medical History  Diagnosis Date  . Hypertension   . Hypothyroidism   . Bradycardia   . Anemia   . Diverticulosis   . External hemorrhoids   . Constipation   . Macular degeneration   . Seasonal allergies   . Osteoporosis   . Degenerative joint disease   . Palpitations   . Hyperlipidemia   . GERD (gastroesophageal reflux disease)   . Pacemaker   . Thyrotoxicosis without mention of goiter or other cause, without mention of thyrotoxic crisis or storm   . Goiter, unspecified     Past Surgical History  Procedure Laterality Date  . Thyroidectomy    . Tonsillectomy    . Colonoscopy  November 2011    External hemorrhoids, diverticulosis, anal papillae  . Insert / replace / remove pacemaker    . Vertebroplasty    . Femur im nail Left 12/29/2012    Procedure: INTRAMEDULLARY (IM) NAIL INTERTROCHANTRIC;  Surgeon: Melina Schools, MD;  Location: [facility];  Service: Orthopedics;  Laterality: Left;  . Permanent pacemaker insertion N/A 06/07/2011   Procedure: PERMANENT PACEMAKER INSERTION;  Surgeon: Evans Lance, MD;  Location: [facility] CATH LAB;  Service: Cardiovascular;  Laterality: N/A;  . Colonoscopy N/A 04/01/2014    Procedure: COLONOSCOPY;  Surgeon: Rogene Houston, MD;  Location: [facility];  Service: Endoscopy;  Laterality: N/A;  27       HPI  from the history and physical done on the day of admission:    [name] is a 82 y.o. female, With history of polymyalgia rheumatica on steroid taper for another month, pacemaker placement by Dr. Lovena Le several years ago for sick sinus, GERD, anemia of chronic disease, hypothyroidism, essential hypertension, history of T-spine fracture in the past requiring kyphoplasty by Dr. Estanislado Pandy several years ago, who lives in assisted living facility comes in after an episode of cough a few days ago after which she experienced a sharp upper back pain which is constant, worse with movement and better with rest and pain medications, no associated symptoms, no radiation, patient continued to have back pain and came to the ER where she was diagnosed with an acute T-spine fracture, case was discussed with interventional radiologist Dr. Estanislado Pandy who requested  hospitalist admission for a possible kyphoplasty tomorrow.  Patient currently besides the above dictated back pain is symptom free, denies any fever or chills, no headache or chest pain, no cough and shortness of breath, she recently was diagnosed with URI and has finished her antibiotic treatment yesterday, no abdominal pain, no dysuria, no blood in stool or urine. No focal weakness.     Hospital Course:     1. Acute T-7 and T-8 spine fracture. Patient is in excruciating pain, failed outpatient pain control, she was treated with supportive care, IR was consulted, she underwent Kyphoplasty on 07-01-15. Discussed the case with IR on 07/01/2015 evening, stable for discharge, we will order home health PT, continue home pain regimen. Follow with PCP and  IR within 7-10 days. Per IR and no back brace needed.  2. History of sick sinus with pacemaker placement. Stable.  3. Essential hypertension. Continue home dose hydralazine , and Norvasc, added Coreg blood pressure better. I'm holding her ACE inhibitor upon discharge due to borderline creatinine.  4. Anxiety. Continue home dose Xanax.  5. History of polymyalgia rheumatica. Continue home dose prednisone which is 2.5 mg daily.  6. Osteoporosis. Continue calcium and vitamin D supplements.  7. GERD. On PPI.  8. Chronic constipation. Continue bowel regimen.  9. Hypothyroidism . Continue home dose Synthroid.   Discharge Condition: Stable  Follow UP  Follow-up Information    Follow up with Wende Neighbors, MD. Schedule an appointment as soon as possible for a visit in 1 week.   Specialty:  Internal Medicine   Contact information:   [place] [id number] [phone]       Follow up with DEVESHWAR, Fritz Pickerel, MD. Schedule an appointment as soon as possible for a visit in 1 week.   Specialty:  Interventional Radiology   Contact information:   [address] [place]  [phone]        Consults obtained - IR  Diet and Activity recommendation: See Discharge Instructions below  Discharge Instructions           Discharge Instructions    Diet - low sodium heart healthy    Complete by:  As directed      Discharge instructions    Complete by:  As directed   1.No stooping,bending or lifting more than 10 lbs for 2 weeks. 2.Use walker to ambulate for 2 weeks . 3.RTC in 2 weeks  Follow with Primary MD Wende Neighbors, MD in 7 days   Get CBC, CMP, 2 view Chest X ray checked  by Primary MD next visit.    Activity: No stooping,bending or lifting more than 10 lbs for 2 weeks. Use walker to ambulate for 2 weeks    Disposition ALF     Diet:   Heart Healthy  .  For Heart failure patients - Check your Weight same time everyday, if you gain  over 2 pounds, or you develop in leg swelling, experience more shortness of breath or chest pain, call your Primary MD immediately. Follow Cardiac Low Salt Diet and 1.5 lit/day fluid restriction.   On your next visit with your primary care physician please Get Medicines reviewed and adjusted.   Please request your Prim.MD to go over all Hospital Tests and Procedure/Radiological results at the follow up, please get all Hospital records sent to your Prim MD by signing hospital release before you go home.   If you experience worsening of your admission symptoms, develop shortness  of breath, life threatening emergency, suicidal or homicidal thoughts you must seek medical attention immediately by calling 911 or calling your MD immediately  if symptoms less severe.  You Must read complete instructions/literature along with all the possible adverse reactions/side effects for all the Medicines you take and that have been prescribed to you. Take any new Medicines after you have completely understood and accpet all the possible adverse reactions/side effects.   Do not drive, operating heavy machinery, perform activities at heights, swimming or participation in water activities or provide baby sitting services if your were admitted for syncope or siezures until you have seen by Primary MD or a Neurologist and advised to do so again.  Do not drive when taking Pain medications.    Do not take more than prescribed Pain, Sleep and Anxiety Medications  Special Instructions: If you have smoked or chewed Tobacco  in the last 2 yrs please stop smoking, stop any regular Alcohol  and or any Recreational drug use.  Wear Seat belts while driving.   Please note  You were cared for by a hospitalist during your hospital stay. If you have any questions about your discharge medications or the care you received while you were in the hospital after you are discharged, you can call the unit and asked to speak with the  hospitalist on call if the hospitalist that took care of you is not available. Once you are discharged, your primary care physician will handle any further medical issues. Please note that NO REFILLS for any discharge medications will be authorized once you are discharged, as it is imperative that you return to your primary care physician (or establish a relationship with a primary care physician if you do not have one) for your aftercare needs so that they can reassess your need for medications and monitor your lab values.             Discharge Medications       Medication List    STOP taking these medications        lisinopril 20 MG tablet  Commonly known as:  PRINIVIL,ZESTRIL      TAKE these medications        ALIGN 4 MG Caps  Take 1 capsule by mouth daily.     ALPRAZolam 0.25 MG tablet  Commonly known as:  XANAX  Take 0.25 mg by mouth at bedtime as needed for sleep.     amLODipine 5 MG tablet  Commonly known as:  NORVASC  Take 5 mg by mouth at bedtime.     amoxicillin-clavulanate 500-125 MG tablet  Commonly known as:  AUGMENTIN  Take 1 tablet by mouth 2 (two) times daily. Started 03/03 for 10 days     aspirin 325 MG tablet  Take 325 mg by mouth daily.     BENEFIBER DRINK MIX PO  Take 10 mLs by mouth daily with breakfast. Mix 2 teaspoonfuls with water and drink daily.     benzonatate 200 MG capsule  Commonly known as:  TESSALON  Take 200 mg by mouth 3 (three) times daily as needed for cough.     CALCIUM 600+D 600-400 MG-UNIT tablet  Generic drug:  Calcium Carbonate-Vitamin D  Take 1 tablet by mouth 2 (two) times daily.     carbamide peroxide 6.5 % otic solution  Commonly known as:  DEBROX  Place 1 drop into both ears daily.     carvedilol 6.25 MG tablet  Commonly known as:  COREG  Take 1 tablet (6.25 mg total) by mouth 2 (two) times daily with a meal.     CENTRUM SILVER tablet  Take 1 tablet by mouth 2 (two) times daily.     multivitamin-lutein Caps  capsule  Take 1 capsule by mouth 2 (two) times daily.     cycloSPORINE 0.05 % ophthalmic emulsion  Commonly known as:  RESTASIS  Place 1 drop into both eyes 2 (two) times daily. *Self Administration*     diphenhydrAMINE 12.5 MG/5ML syrup  Commonly known as:  BENYLIN  Take 5 mLs (12.5 mg total) by mouth 4 (four) times daily as needed for itching or allergies.     docusate sodium 100 MG capsule  Commonly known as:  COLACE  Take 100 mg by mouth at bedtime. For constipation     fluticasone 50 MCG/ACT nasal spray  Commonly known as:  FLONASE  Place 2 sprays into the nose daily. For allergies     FUSION PLUS PO  Take 1 capsule by mouth daily.     hydrALAZINE 25 MG tablet  Commonly known as:  APRESOLINE  Take 25 mg by mouth 2 (two) times daily.     HYDROcodone-acetaminophen 5-325 MG tablet  Commonly known as:  NORCO/VICODIN  Take 1 tablet by mouth every 8 (eight) hours as needed for moderate pain.     hydrocortisone 2.5 % rectal cream  Commonly known as:  ANUSOL-HC  Place rectally 3 (three) times daily.     levothyroxine 100 MCG tablet  Commonly known as:  SYNTHROID, LEVOTHROID  Take 100 mcg by mouth daily before breakfast.     loperamide 2 MG capsule  Commonly known as:  IMODIUM  Take 2 mg by mouth every 4 (four) hours as needed for diarrhea or loose stools.     loratadine 10 MG tablet  Commonly known as:  CLARITIN  Take 10 mg by mouth daily.     Magnesium 250 MG Tabs  Take 1 tablet by mouth at bedtime.     magnesium hydroxide 400 MG/5ML suspension  Commonly known as:  MILK OF MAGNESIA  Take 0.5 mLs by mouth as needed for mild constipation.     Melatonin 1 MG Tabs  Take 1 mg by mouth at bedtime. For rest     methocarbamol 500 MG tablet  Commonly known as:  ROBAXIN  Take 1 tablet (500 mg total) by mouth every 6 (six) hours as needed for muscle spasms.     MUCINEX DM MAXIMUM STRENGTH 60-1200 MG Tb12  Take 1 tablet by mouth 2 (two) times daily. Started 03/03 for 1  week     omeprazole 20 MG capsule  Commonly known as:  PRILOSEC  Take 20 mg by mouth daily. Reported on 06/25/2015     Oxycodone HCl 10 MG Tabs  Take 1 tablet (10 mg total) by mouth 4 (four) times daily as needed.     polyethylene glycol packet  Commonly known as:  MIRALAX / GLYCOLAX  Take 17 g by mouth daily.     predniSONE 2.5 MG tablet  Commonly known as:  DELTASONE  Take 2.5 mg by mouth daily.     sertraline 50 MG tablet  Commonly known as:  ZOLOFT  Take 50 mg by mouth daily.     SINUS RINSE NA  Place 1 application into both nostrils daily.     TYLENOL 500 MG tablet  Generic drug:  acetaminophen  Take 1,000 mg by mouth every 8 (eight) hours as needed for  mild pain.        Major procedures and Radiology Reports - PLEASE review detailed and final reports for all details, in brief -       Dg Chest 2 View  06/25/2015  CLINICAL DATA:  Cough for 2 weeks, thoracic spine pain from coughing, history hypertension, pacemaker EXAM: CHEST  2 VIEW COMPARISON:  12/03/2014 FINDINGS: LEFT subclavian transvenous pacemaker leads project at RIGHT atrium and RIGHT ventricle. Enlargement of cardiac silhouette. Atherosclerotic calcification aorta. Eventration RIGHT diaphragm stable. Mediastinal contours and pulmonary vascularity normal. Emphysematous and bronchitic changes consistent with COPD. No acute infiltrate, pleural effusion or pneumothorax. Calcifications in the breasts bilaterally again noted. Bones diffusely demineralized. Posttraumatic deformity proximal LEFT humerus, old. Chronic compression fractures in the mid thoracic spine grossly stable. IMPRESSION: Enlargement of cardiac silhouette post pacemaker. COPD changes without acute infiltrate. Electronically Signed   By: Lavonia Dana M.D.   On: 06/25/2015 12:40   Dg Thoracic Spine 2 View  06/25/2015  CLINICAL DATA:  Back pain after cough for 2-3 days, pain upper thoracic spine EXAM: THORACIC SPINE 2 VIEWS COMPARISON:  12/03/2014 FINDINGS:  Mild sigmoid scoliotic curvature. Significant compression deformity at the junction of the upper to mid thoracic spine stable. Mild to moderate compression deformity 2 levels below this stable to minimally progressed. Mild compression deformity 1 level further inferior also stable. IMPRESSION: Stable thoracic spine compression deformities Electronically Signed   By: Skipper Cliche M.D.   On: 06/25/2015 13:46   Ct Chest Wo Contrast  06/30/2015  CLINICAL DATA:  Chest pain around ribcage and upper back pain for the past week EXAM: CT CHEST WITHOUT CONTRAST TECHNIQUE: Multidetector CT imaging of the chest was performed following the standard protocol without IV contrast. COMPARISON:  06/25/2015 FINDINGS: Mediastinum/Nodes: Cardiac pacer noted. Trace pericardial effusion. Mild cardiac enlargement. Calcification of the thoracic aorta. No significant hilar or mediastinal adenopathy. Lungs/Pleura: Trace and possibly loculated right pleural effusion. 4 mm pulmonary nodule in the inferior right upper lobe based along the minor fissure. 3 mm pulmonary nodule lateral right lower lobe and 4 mm pulmonary nodule anterior inferior right middle lobe. Mild discoid atelectasis right lower lobe 4 mm pulmonary nodule in the lingula image number 27. Left lung otherwise clear. Upper abdomen: No acute findings Musculoskeletal: 50% T8 compression deformity with no retropulsion. Approximate 25% T7 compression deformity with no retropulsion. Over 50% T6 compression deformity with no retropulsion. This level is status post kyphoplasty. These levels appear unchanged from 06/25/2015. 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 Old anterior right fifth sixth and seventh rib fractures. No acute rib fractures identified. IMPRESSION: Multiple bilateral tiny pulmonary nodules. If the patient is at high risk for bronchogenic carcinoma, follow-up chest CT at 1 year is recommended. If the patient is at low  risk, no follow-up is needed. This recommendation follows the consensus statement: Guidelines for Management of Small Pulmonary Nodules Detected on CT Scans: A Statement from the [employer] as published in Radiology 2005; 237:395-400. Old right rib fractures. Trace loculated right pleural effusion or chronic pleural thickening possibly related. Compression deformities thoracic spine at three consecutive levels appear without acute features and stable from 06/25/15, with the most cephalad and severe status post kyphoplasty. Electronically Signed   By: Skipper Cliche M.D.   On: 06/30/2015 16:53    Micro Results      No results found for this or any previous visit (from the past 240 hour(s)).     Today  Subjective    [name] today has no headache,no chest abdominal pain,no new weakness tingling or numbness, feels much better wants to go home today.     Objective   Blood pressure 120/55, pulse 66, temperature 98.4 F (36.9 C), temperature source Oral, resp. rate 16, height 5\' 5"  (1.651 m), weight 67.1 kg (147 lb 14.9 oz), SpO2 93 %.   Intake/Output Summary (Last 24 hours) at 07/02/15 1047 Last data filed at 07/01/15 1700  Gross per 24 hour  Intake      0 ml  Output      0 ml  Net      0 ml    Exam Awake Alert, Oriented x 3, No new F.N deficits, Normal affect [place].AT,PERRAL Supple Neck,No JVD, No cervical lymphadenopathy appriciated.  Symmetrical Chest wall movement, Good air movement bilaterally, CTAB RRR,No Gallops,Rubs or new Murmurs, No Parasternal Heave +ve B.Sounds, Abd Soft, Non tender, No organomegaly appriciated, No rebound -guarding or rigidity. No Cyanosis, Clubbing or edema, No new Rash or bruise   Data Review   CBC w Diff:  Lab Results  Component Value Date   WBC 5.8 07/01/2015   WBC 6.5 12/15/2014   HGB 9.8* 07/01/2015   HCT 29.9* 07/01/2015   PLT 330 07/01/2015   LYMPHOPCT 26 06/30/2015   MONOPCT 11 06/30/2015   EOSPCT 0 06/30/2015   BASOPCT 1  06/30/2015    CMP:  Lab Results  Component Value Date   NA 138 07/01/2015   NA 130* 12/15/2014   K 4.1 07/01/2015   CL 99* 07/01/2015   CO2 27 07/01/2015   BUN 16 07/01/2015   BUN 26* 12/15/2014   CREATININE 1.10* 07/01/2015   CREATININE 1.1 12/15/2014   GLU 79 12/15/2014   PROT 7.1 06/25/2015   ALBUMIN 4.8 06/25/2015   BILITOT 0.9 06/25/2015   ALKPHOS 69 06/25/2015   AST 35 06/25/2015   ALT 16 06/25/2015  .   Total Time in preparing paper work, data evaluation and todays exam - 35 minutes  Thurnell Lose M.D on 07/02/2015 at 10:47 AM  [facility]   Office  [phone]

## 2015-07-05 ENCOUNTER — Other Ambulatory Visit (HOSPITAL_COMMUNITY): Payer: Self-pay | Admitting: Interventional Radiology

## 2015-07-05 DIAGNOSIS — IMO0002 Reserved for concepts with insufficient information to code with codable children: Secondary | ICD-10-CM

## 2015-07-06 ENCOUNTER — Encounter: Payer: Self-pay | Admitting: Cardiology

## 2015-07-08 DIAGNOSIS — H3530 Unspecified macular degeneration: Secondary | ICD-10-CM | POA: Diagnosis not present

## 2015-07-08 DIAGNOSIS — Z7982 Long term (current) use of aspirin: Secondary | ICD-10-CM | POA: Diagnosis not present

## 2015-07-08 DIAGNOSIS — M8008XD Age-related osteoporosis with current pathological fracture, vertebra(e), subsequent encounter for fracture with routine healing: Secondary | ICD-10-CM | POA: Diagnosis not present

## 2015-07-08 DIAGNOSIS — M353 Polymyalgia rheumatica: Secondary | ICD-10-CM | POA: Diagnosis not present

## 2015-07-08 DIAGNOSIS — H542 Low vision, both eyes: Secondary | ICD-10-CM | POA: Diagnosis not present

## 2015-07-08 DIAGNOSIS — D649 Anemia, unspecified: Secondary | ICD-10-CM | POA: Diagnosis not present

## 2015-07-08 DIAGNOSIS — Z9181 History of falling: Secondary | ICD-10-CM | POA: Diagnosis not present

## 2015-07-08 DIAGNOSIS — Z7952 Long term (current) use of systemic steroids: Secondary | ICD-10-CM | POA: Diagnosis not present

## 2015-07-08 DIAGNOSIS — K219 Gastro-esophageal reflux disease without esophagitis: Secondary | ICD-10-CM | POA: Diagnosis not present

## 2015-07-08 DIAGNOSIS — I10 Essential (primary) hypertension: Secondary | ICD-10-CM | POA: Diagnosis not present

## 2015-07-08 DIAGNOSIS — Z950 Presence of cardiac pacemaker: Secondary | ICD-10-CM | POA: Diagnosis not present

## 2015-07-11 DIAGNOSIS — M353 Polymyalgia rheumatica: Secondary | ICD-10-CM | POA: Diagnosis not present

## 2015-07-11 DIAGNOSIS — H3530 Unspecified macular degeneration: Secondary | ICD-10-CM | POA: Diagnosis not present

## 2015-07-11 DIAGNOSIS — D649 Anemia, unspecified: Secondary | ICD-10-CM | POA: Diagnosis not present

## 2015-07-11 DIAGNOSIS — H542 Low vision, both eyes: Secondary | ICD-10-CM | POA: Diagnosis not present

## 2015-07-11 DIAGNOSIS — M8008XD Age-related osteoporosis with current pathological fracture, vertebra(e), subsequent encounter for fracture with routine healing: Secondary | ICD-10-CM | POA: Diagnosis not present

## 2015-07-11 DIAGNOSIS — I10 Essential (primary) hypertension: Secondary | ICD-10-CM | POA: Diagnosis not present

## 2015-07-12 DIAGNOSIS — M8008XD Age-related osteoporosis with current pathological fracture, vertebra(e), subsequent encounter for fracture with routine healing: Secondary | ICD-10-CM | POA: Diagnosis not present

## 2015-07-12 DIAGNOSIS — I10 Essential (primary) hypertension: Secondary | ICD-10-CM | POA: Diagnosis not present

## 2015-07-12 DIAGNOSIS — H542 Low vision, both eyes: Secondary | ICD-10-CM | POA: Diagnosis not present

## 2015-07-12 DIAGNOSIS — H3530 Unspecified macular degeneration: Secondary | ICD-10-CM | POA: Diagnosis not present

## 2015-07-12 DIAGNOSIS — D649 Anemia, unspecified: Secondary | ICD-10-CM | POA: Diagnosis not present

## 2015-07-12 DIAGNOSIS — M353 Polymyalgia rheumatica: Secondary | ICD-10-CM | POA: Diagnosis not present

## 2015-07-14 DIAGNOSIS — D649 Anemia, unspecified: Secondary | ICD-10-CM | POA: Diagnosis not present

## 2015-07-14 DIAGNOSIS — H542 Low vision, both eyes: Secondary | ICD-10-CM | POA: Diagnosis not present

## 2015-07-14 DIAGNOSIS — I10 Essential (primary) hypertension: Secondary | ICD-10-CM | POA: Diagnosis not present

## 2015-07-14 DIAGNOSIS — M353 Polymyalgia rheumatica: Secondary | ICD-10-CM | POA: Diagnosis not present

## 2015-07-14 DIAGNOSIS — M8008XD Age-related osteoporosis with current pathological fracture, vertebra(e), subsequent encounter for fracture with routine healing: Secondary | ICD-10-CM | POA: Diagnosis not present

## 2015-07-14 DIAGNOSIS — H3530 Unspecified macular degeneration: Secondary | ICD-10-CM | POA: Diagnosis not present

## 2015-07-15 ENCOUNTER — Ambulatory Visit (HOSPITAL_COMMUNITY)
Admission: RE | Admit: 2015-07-15 | Discharge: 2015-07-15 | Disposition: A | Discharge status: Home / Self Care | Payer: Medicare Other | Source: Ambulatory Visit | Attending: Interventional Radiology | Admitting: Interventional Radiology

## 2015-07-15 DIAGNOSIS — IMO0002 Reserved for concepts with insufficient information to code with codable children: Secondary | ICD-10-CM

## 2015-07-18 DIAGNOSIS — M353 Polymyalgia rheumatica: Secondary | ICD-10-CM | POA: Diagnosis not present

## 2015-07-18 DIAGNOSIS — H3530 Unspecified macular degeneration: Secondary | ICD-10-CM | POA: Diagnosis not present

## 2015-07-18 DIAGNOSIS — M8008XD Age-related osteoporosis with current pathological fracture, vertebra(e), subsequent encounter for fracture with routine healing: Secondary | ICD-10-CM | POA: Diagnosis not present

## 2015-07-18 DIAGNOSIS — I10 Essential (primary) hypertension: Secondary | ICD-10-CM | POA: Diagnosis not present

## 2015-07-18 DIAGNOSIS — D649 Anemia, unspecified: Secondary | ICD-10-CM | POA: Diagnosis not present

## 2015-07-18 DIAGNOSIS — H542 Low vision, both eyes: Secondary | ICD-10-CM | POA: Diagnosis not present

## 2015-07-19 DIAGNOSIS — H542 Low vision, both eyes: Secondary | ICD-10-CM | POA: Diagnosis not present

## 2015-07-19 DIAGNOSIS — M353 Polymyalgia rheumatica: Secondary | ICD-10-CM | POA: Diagnosis not present

## 2015-07-19 DIAGNOSIS — M8008XD Age-related osteoporosis with current pathological fracture, vertebra(e), subsequent encounter for fracture with routine healing: Secondary | ICD-10-CM | POA: Diagnosis not present

## 2015-07-19 DIAGNOSIS — D649 Anemia, unspecified: Secondary | ICD-10-CM | POA: Diagnosis not present

## 2015-07-19 DIAGNOSIS — H3530 Unspecified macular degeneration: Secondary | ICD-10-CM | POA: Diagnosis not present

## 2015-07-19 DIAGNOSIS — I10 Essential (primary) hypertension: Secondary | ICD-10-CM | POA: Diagnosis not present

## 2015-07-20 DIAGNOSIS — H1010 Acute atopic conjunctivitis, unspecified eye: Secondary | ICD-10-CM | POA: Diagnosis not present

## 2015-07-20 DIAGNOSIS — H353221 Exudative age-related macular degeneration, left eye, with active choroidal neovascularization: Secondary | ICD-10-CM | POA: Diagnosis not present

## 2015-07-20 DIAGNOSIS — H353134 Nonexudative age-related macular degeneration, bilateral, advanced atrophic with subfoveal involvement: Secondary | ICD-10-CM | POA: Diagnosis not present

## 2015-07-20 DIAGNOSIS — H353212 Exudative age-related macular degeneration, right eye, with inactive choroidal neovascularization: Secondary | ICD-10-CM | POA: Diagnosis not present

## 2015-07-21 DIAGNOSIS — H3530 Unspecified macular degeneration: Secondary | ICD-10-CM | POA: Diagnosis not present

## 2015-07-21 DIAGNOSIS — M353 Polymyalgia rheumatica: Secondary | ICD-10-CM | POA: Diagnosis not present

## 2015-07-21 DIAGNOSIS — H542 Low vision, both eyes: Secondary | ICD-10-CM | POA: Diagnosis not present

## 2015-07-21 DIAGNOSIS — D649 Anemia, unspecified: Secondary | ICD-10-CM | POA: Diagnosis not present

## 2015-07-21 DIAGNOSIS — M8008XD Age-related osteoporosis with current pathological fracture, vertebra(e), subsequent encounter for fracture with routine healing: Secondary | ICD-10-CM | POA: Diagnosis not present

## 2015-07-21 DIAGNOSIS — I10 Essential (primary) hypertension: Secondary | ICD-10-CM | POA: Diagnosis not present

## 2015-07-25 DIAGNOSIS — D649 Anemia, unspecified: Secondary | ICD-10-CM | POA: Diagnosis not present

## 2015-07-25 DIAGNOSIS — I10 Essential (primary) hypertension: Secondary | ICD-10-CM | POA: Diagnosis not present

## 2015-07-25 DIAGNOSIS — M353 Polymyalgia rheumatica: Secondary | ICD-10-CM | POA: Diagnosis not present

## 2015-07-25 DIAGNOSIS — M8008XD Age-related osteoporosis with current pathological fracture, vertebra(e), subsequent encounter for fracture with routine healing: Secondary | ICD-10-CM | POA: Diagnosis not present

## 2015-07-25 DIAGNOSIS — H3530 Unspecified macular degeneration: Secondary | ICD-10-CM | POA: Diagnosis not present

## 2015-07-25 DIAGNOSIS — H542 Low vision, both eyes: Secondary | ICD-10-CM | POA: Diagnosis not present

## 2015-07-26 DIAGNOSIS — I10 Essential (primary) hypertension: Secondary | ICD-10-CM | POA: Diagnosis not present

## 2015-07-26 DIAGNOSIS — E039 Hypothyroidism, unspecified: Secondary | ICD-10-CM | POA: Diagnosis not present

## 2015-07-26 DIAGNOSIS — K5900 Constipation, unspecified: Secondary | ICD-10-CM | POA: Diagnosis not present

## 2015-07-26 DIAGNOSIS — F5101 Primary insomnia: Secondary | ICD-10-CM | POA: Diagnosis not present

## 2015-07-26 DIAGNOSIS — D649 Anemia, unspecified: Secondary | ICD-10-CM | POA: Diagnosis not present

## 2015-07-27 DIAGNOSIS — I10 Essential (primary) hypertension: Secondary | ICD-10-CM | POA: Diagnosis not present

## 2015-07-27 DIAGNOSIS — H542 Low vision, both eyes: Secondary | ICD-10-CM | POA: Diagnosis not present

## 2015-07-27 DIAGNOSIS — D649 Anemia, unspecified: Secondary | ICD-10-CM | POA: Diagnosis not present

## 2015-07-27 DIAGNOSIS — M353 Polymyalgia rheumatica: Secondary | ICD-10-CM | POA: Diagnosis not present

## 2015-07-27 DIAGNOSIS — M8008XD Age-related osteoporosis with current pathological fracture, vertebra(e), subsequent encounter for fracture with routine healing: Secondary | ICD-10-CM | POA: Diagnosis not present

## 2015-07-27 DIAGNOSIS — H3530 Unspecified macular degeneration: Secondary | ICD-10-CM | POA: Diagnosis not present

## 2015-07-28 DIAGNOSIS — M8008XD Age-related osteoporosis with current pathological fracture, vertebra(e), subsequent encounter for fracture with routine healing: Secondary | ICD-10-CM | POA: Diagnosis not present

## 2015-07-28 DIAGNOSIS — H542 Low vision, both eyes: Secondary | ICD-10-CM | POA: Diagnosis not present

## 2015-07-28 DIAGNOSIS — I10 Essential (primary) hypertension: Secondary | ICD-10-CM | POA: Diagnosis not present

## 2015-07-28 DIAGNOSIS — M353 Polymyalgia rheumatica: Secondary | ICD-10-CM | POA: Diagnosis not present

## 2015-07-28 DIAGNOSIS — D649 Anemia, unspecified: Secondary | ICD-10-CM | POA: Diagnosis not present

## 2015-07-28 DIAGNOSIS — H3530 Unspecified macular degeneration: Secondary | ICD-10-CM | POA: Diagnosis not present

## 2015-08-01 DIAGNOSIS — M8008XD Age-related osteoporosis with current pathological fracture, vertebra(e), subsequent encounter for fracture with routine healing: Secondary | ICD-10-CM | POA: Diagnosis not present

## 2015-08-01 DIAGNOSIS — I10 Essential (primary) hypertension: Secondary | ICD-10-CM | POA: Diagnosis not present

## 2015-08-01 DIAGNOSIS — H542 Low vision, both eyes: Secondary | ICD-10-CM | POA: Diagnosis not present

## 2015-08-01 DIAGNOSIS — D649 Anemia, unspecified: Secondary | ICD-10-CM | POA: Diagnosis not present

## 2015-08-01 DIAGNOSIS — H3530 Unspecified macular degeneration: Secondary | ICD-10-CM | POA: Diagnosis not present

## 2015-08-01 DIAGNOSIS — M353 Polymyalgia rheumatica: Secondary | ICD-10-CM | POA: Diagnosis not present

## 2015-08-02 DIAGNOSIS — H3530 Unspecified macular degeneration: Secondary | ICD-10-CM | POA: Diagnosis not present

## 2015-08-02 DIAGNOSIS — D649 Anemia, unspecified: Secondary | ICD-10-CM | POA: Diagnosis not present

## 2015-08-02 DIAGNOSIS — I10 Essential (primary) hypertension: Secondary | ICD-10-CM | POA: Diagnosis not present

## 2015-08-02 DIAGNOSIS — M8008XD Age-related osteoporosis with current pathological fracture, vertebra(e), subsequent encounter for fracture with routine healing: Secondary | ICD-10-CM | POA: Diagnosis not present

## 2015-08-02 DIAGNOSIS — H542 Low vision, both eyes: Secondary | ICD-10-CM | POA: Diagnosis not present

## 2015-08-02 DIAGNOSIS — M353 Polymyalgia rheumatica: Secondary | ICD-10-CM | POA: Diagnosis not present

## 2015-08-08 DIAGNOSIS — D649 Anemia, unspecified: Secondary | ICD-10-CM | POA: Diagnosis not present

## 2015-08-08 DIAGNOSIS — I10 Essential (primary) hypertension: Secondary | ICD-10-CM | POA: Diagnosis not present

## 2015-08-08 DIAGNOSIS — M8008XD Age-related osteoporosis with current pathological fracture, vertebra(e), subsequent encounter for fracture with routine healing: Secondary | ICD-10-CM | POA: Diagnosis not present

## 2015-08-08 DIAGNOSIS — M353 Polymyalgia rheumatica: Secondary | ICD-10-CM | POA: Diagnosis not present

## 2015-08-08 DIAGNOSIS — H3530 Unspecified macular degeneration: Secondary | ICD-10-CM | POA: Diagnosis not present

## 2015-08-08 DIAGNOSIS — H542 Low vision, both eyes: Secondary | ICD-10-CM | POA: Diagnosis not present

## 2015-08-10 DIAGNOSIS — M353 Polymyalgia rheumatica: Secondary | ICD-10-CM | POA: Diagnosis not present

## 2015-08-10 DIAGNOSIS — H542 Low vision, both eyes: Secondary | ICD-10-CM | POA: Diagnosis not present

## 2015-08-10 DIAGNOSIS — D649 Anemia, unspecified: Secondary | ICD-10-CM | POA: Diagnosis not present

## 2015-08-10 DIAGNOSIS — M8008XD Age-related osteoporosis with current pathological fracture, vertebra(e), subsequent encounter for fracture with routine healing: Secondary | ICD-10-CM | POA: Diagnosis not present

## 2015-08-10 DIAGNOSIS — H3530 Unspecified macular degeneration: Secondary | ICD-10-CM | POA: Diagnosis not present

## 2015-08-10 DIAGNOSIS — I10 Essential (primary) hypertension: Secondary | ICD-10-CM | POA: Diagnosis not present

## 2015-08-15 DIAGNOSIS — H542 Low vision, both eyes: Secondary | ICD-10-CM | POA: Diagnosis not present

## 2015-08-15 DIAGNOSIS — M8008XD Age-related osteoporosis with current pathological fracture, vertebra(e), subsequent encounter for fracture with routine healing: Secondary | ICD-10-CM | POA: Diagnosis not present

## 2015-08-15 DIAGNOSIS — H3530 Unspecified macular degeneration: Secondary | ICD-10-CM | POA: Diagnosis not present

## 2015-08-15 DIAGNOSIS — I10 Essential (primary) hypertension: Secondary | ICD-10-CM | POA: Diagnosis not present

## 2015-08-15 DIAGNOSIS — M353 Polymyalgia rheumatica: Secondary | ICD-10-CM | POA: Diagnosis not present

## 2015-08-15 DIAGNOSIS — D649 Anemia, unspecified: Secondary | ICD-10-CM | POA: Diagnosis not present

## 2015-08-17 DIAGNOSIS — H1010 Acute atopic conjunctivitis, unspecified eye: Secondary | ICD-10-CM | POA: Diagnosis not present

## 2015-08-17 DIAGNOSIS — H353221 Exudative age-related macular degeneration, left eye, with active choroidal neovascularization: Secondary | ICD-10-CM | POA: Diagnosis not present

## 2015-08-17 DIAGNOSIS — H353134 Nonexudative age-related macular degeneration, bilateral, advanced atrophic with subfoveal involvement: Secondary | ICD-10-CM | POA: Diagnosis not present

## 2015-08-17 DIAGNOSIS — H353212 Exudative age-related macular degeneration, right eye, with inactive choroidal neovascularization: Secondary | ICD-10-CM | POA: Diagnosis not present

## 2015-08-18 DIAGNOSIS — I10 Essential (primary) hypertension: Secondary | ICD-10-CM | POA: Diagnosis not present

## 2015-08-18 DIAGNOSIS — D649 Anemia, unspecified: Secondary | ICD-10-CM | POA: Diagnosis not present

## 2015-08-18 DIAGNOSIS — M8008XD Age-related osteoporosis with current pathological fracture, vertebra(e), subsequent encounter for fracture with routine healing: Secondary | ICD-10-CM | POA: Diagnosis not present

## 2015-08-18 DIAGNOSIS — H542 Low vision, both eyes: Secondary | ICD-10-CM | POA: Diagnosis not present

## 2015-08-18 DIAGNOSIS — M353 Polymyalgia rheumatica: Secondary | ICD-10-CM | POA: Diagnosis not present

## 2015-08-18 DIAGNOSIS — H3530 Unspecified macular degeneration: Secondary | ICD-10-CM | POA: Diagnosis not present

## 2015-09-07 ENCOUNTER — Ambulatory Visit (INDEPENDENT_AMBULATORY_CARE_PROVIDER_SITE_OTHER): Payer: Medicare Other | Admitting: Internal Medicine

## 2015-09-07 ENCOUNTER — Encounter: Payer: Self-pay | Admitting: Internal Medicine

## 2015-09-07 VITALS — BP 142/60 | HR 61 | Ht 65.5 in | Wt 130.1 lb

## 2015-09-07 DIAGNOSIS — I495 Sick sinus syndrome: Secondary | ICD-10-CM | POA: Diagnosis not present

## 2015-09-07 LAB — CUP PACEART INCLINIC DEVICE CHECK
Brady Statistic RV Percent Paced: 0.34 %
Date Time Interrogation Session: 20170517102622
Implantable Lead Implant Date: 20130214
Implantable Lead Implant Date: 20130214
Implantable Lead Location: 753860
Lead Channel Impedance Value: 400 Ohm
Lead Channel Impedance Value: 412.5 Ohm
Lead Channel Pacing Threshold Amplitude: 0.75 V
Lead Channel Pacing Threshold Amplitude: 1.25 V
Lead Channel Pacing Threshold Pulse Width: 0.5 ms
Lead Channel Sensing Intrinsic Amplitude: 12 mV
Lead Channel Setting Pacing Amplitude: 2 V
Lead Channel Setting Pacing Amplitude: 2.5 V
Lead Channel Setting Sensing Sensitivity: 2 mV
MDC IDC LEAD LOCATION: 753859
MDC IDC MSMT BATTERY REMAINING LONGEVITY: 121.2
MDC IDC MSMT BATTERY VOLTAGE: 2.95 V
MDC IDC MSMT LEADCHNL RA PACING THRESHOLD AMPLITUDE: 0.75 V
MDC IDC MSMT LEADCHNL RA PACING THRESHOLD PULSEWIDTH: 0.5 ms
MDC IDC MSMT LEADCHNL RA PACING THRESHOLD PULSEWIDTH: 0.5 ms
MDC IDC MSMT LEADCHNL RA SENSING INTR AMPL: 2.3 mV
MDC IDC MSMT LEADCHNL RV PACING THRESHOLD AMPLITUDE: 1.25 V
MDC IDC MSMT LEADCHNL RV PACING THRESHOLD PULSEWIDTH: 0.5 ms
MDC IDC SET LEADCHNL RV PACING PULSEWIDTH: 0.5 ms
MDC IDC STAT BRADY RA PERCENT PACED: 25 %
Pulse Gen Model: 2210
Pulse Gen Serial Number: 7316836

## 2015-09-07 NOTE — Progress Notes (Addendum)
HPI  [name] returns today for followup. She is a very pleasant 82 year old woman with a history of hypertension, palpitations, symptomatic bradycardia, status post permanent pacemaker insertion. She has been stable in the past year except she has had a fall where she broke her left shoulder and her pelvis and required a long rehab. She is living in assisted living. She admits to be fairly sedentary. Allergies  Allergen Reactions  . Tramadol Itching and Nausea Only  . Sulfa Antibiotics Nausea And Vomiting     Current Outpatient Prescriptions  Medication Sig Dispense Refill  . acetaminophen (TYLENOL) 500 MG tablet Take 1,000 mg by mouth every 8 (eight) hours as needed for mild pain.    Marland Kitchen ALPRAZolam (XANAX) 0.25 MG tablet Take 0.25 mg by mouth at bedtime as needed for sleep.    Marland Kitchen amLODipine (NORVASC) 5 MG tablet Take 5 mg by mouth at bedtime.     Marland Kitchen amoxicillin-clavulanate (AUGMENTIN) 500-125 MG tablet Take 1 tablet by mouth 2 (two) times daily. Started 03/03 for 10 days    . aspirin 325 MG tablet Take 325 mg by mouth daily.    . benzonatate (TESSALON) 200 MG capsule Take 200 mg by mouth 3 (three) times daily as needed for cough.    . Calcium Carbonate-Vitamin D (CALCIUM 600+D) 600-400 MG-UNIT tablet Take 1 tablet by mouth 2 (two) times daily.    . carbamide peroxide (DEBROX) 6.5 % otic solution Place 1 drop into both ears daily.     . carvedilol (COREG) 6.25 MG tablet Take 1 tablet (6.25 mg total) by mouth 2 (two) times daily with a meal. 60 tablet 0  . cycloSPORINE (RESTASIS) 0.05 % ophthalmic emulsion Place 1 drop into both eyes 2 (two) times daily. *Self Administration*    . Dextromethorphan-Guaifenesin (MUCINEX DM MAXIMUM STRENGTH) 60-1200 MG TB12 Take 1 tablet by mouth 2 (two) times daily. Started 03/03 for 1 week  0  . diphenhydrAMINE (BENYLIN) 12.5 MG/5ML syrup Take 5 mLs (12.5 mg total) by mouth 4 (four) times daily as needed for itching or allergies. 120 mL 0  . docusate sodium  (COLACE) 100 MG capsule Take 100 mg by mouth at bedtime. For constipation    . fluticasone (FLONASE) 50 MCG/ACT nasal spray Place 2 sprays into the nose daily. For allergies    . hydrALAZINE (APRESOLINE) 25 MG tablet Take 25 mg by mouth 2 (two) times daily.     Marland Kitchen HYDROcodone-acetaminophen (NORCO/VICODIN) 5-325 MG tablet Take 1 tablet by mouth every 8 (eight) hours as needed for moderate pain.    Marland Kitchen Hypertonic Nasal Wash (SINUS RINSE NA) Place 1 application into both nostrils daily.    . Iron-FA-B Cmp-C-Biot-Probiotic (FUSION PLUS PO) Take 1 capsule by mouth daily.    Marland Kitchen levothyroxine (SYNTHROID, LEVOTHROID) 100 MCG tablet Take 100 mcg by mouth daily before breakfast.    . loperamide (IMODIUM) 2 MG capsule Take 2 mg by mouth every 4 (four) hours as needed for diarrhea or loose stools.    Marland Kitchen loratadine (CLARITIN) 10 MG tablet Take 10 mg by mouth daily.    . Magnesium 250 MG TABS Take 1 tablet by mouth at bedtime.     . magnesium hydroxide (MILK OF MAGNESIA) 400 MG/5ML suspension Take 0.5 mLs by mouth as needed for mild constipation.    . Melatonin 1 MG TABS Take 1 mg by mouth at bedtime. For rest    . methocarbamol (ROBAXIN) 500 MG tablet Take 1 tablet (500 mg total) by mouth every  6 (six) hours as needed for muscle spasms. 30 tablet 0  . Multiple Vitamins-Minerals (CENTRUM SILVER) tablet Take 1 tablet by mouth 2 (two) times daily.    . multivitamin-lutein (OCUVITE-LUTEIN) CAPS capsule Take 1 capsule by mouth 2 (two) times daily.    Marland Kitchen omeprazole (PRILOSEC) 20 MG capsule Take 20 mg by mouth daily. Reported on 06/25/2015    . Oxycodone HCl 10 MG TABS Take 1 tablet (10 mg total) by mouth 4 (four) times daily as needed. 20 tablet 0  . polyethylene glycol (MIRALAX / GLYCOLAX) packet Take 17 g by mouth daily.    . predniSONE (DELTASONE) 2.5 MG tablet Take 2.5 mg by mouth daily.    . Probiotic Product (ALIGN) 4 MG CAPS Take 1 capsule by mouth daily.    . sertraline (ZOLOFT) 50 MG tablet Take 50 mg by mouth  daily.    . Wheat Dextrin (BENEFIBER DRINK MIX PO) Take 10 mLs by mouth daily with breakfast. Mix 2 teaspoonfuls with water and drink daily.     No current facility-administered medications for this visit.     Past Medical History  Diagnosis Date  . Hypertension   . Hypothyroidism   . Bradycardia   . Anemia   . Diverticulosis   . External hemorrhoids   . Constipation   . Macular degeneration   . Seasonal allergies   . Osteoporosis   . Degenerative joint disease   . Palpitations   . Hyperlipidemia   . GERD (gastroesophageal reflux disease)   . Pacemaker   . Thyrotoxicosis without mention of goiter or other cause, without mention of thyrotoxic crisis or storm   . Goiter, unspecified     ROS:   All systems reviewed and negative except as noted in the HPI.   Past Surgical History  Procedure Laterality Date  . Thyroidectomy    . Tonsillectomy    . Colonoscopy  November 2011    External hemorrhoids, diverticulosis, anal papillae  . Insert / replace / remove pacemaker    . Vertebroplasty    . Femur im nail Left 12/29/2012    Procedure: INTRAMEDULLARY (IM) NAIL INTERTROCHANTRIC;  Surgeon: Melina Schools, MD;  Location: [place];  Service: Orthopedics;  Laterality: Left;  . Permanent pacemaker insertion N/A 06/07/2011    Procedure: PERMANENT PACEMAKER INSERTION;  Surgeon: Evans Lance, MD;  Location: [facility] CATH LAB;  Service: Cardiovascular;  Laterality: N/A;  . Colonoscopy N/A 04/01/2014    Procedure: COLONOSCOPY;  Surgeon: Rogene Houston, MD;  [facility];  Service: Endoscopy;  Laterality: N/A;  930     Family History  Problem Relation Age of Onset  . Heart attack Mother     Deceased  . Cancer Father     Deceased, colon cancer age 19  . Cancer Brother     Deceased, throat and lung     Social History   Social History  . Marital Status: Widowed    Spouse Name: N/A  . Number of Children: N/A  . Years of Education: N/A   Occupational History  . Not on  file.   Social History Main Topics  . Smoking status: Never Smoker   . Smokeless tobacco: Never Used  . Alcohol Use: No  . Drug Use: No  . Sexual Activity: Not on file   Other Topics Concern  . Not on file   Social History Narrative     BP 142/60 mmHg  Pulse 61  Ht 5' 5.5" (1.664 m)  Wt 130 lb 1.9 oz (59.022 kg)  BMI 21.32 kg/m2  SpO2 96%  Physical Exam:  Well appearing 82 year old woman,NAD HEENT: Unremarkable Neck:  7 cm JVD, no thyromegally Lungs:  Clear with no wheezes, rales, or rhonchi. HEART:  Regular rate rhythm, no murmurs, no rubs, no clicks Abd:  soft, positive bowel sounds, no organomegally, no rebound, no guarding Ext:  2 plus pulses, no edema, no cyanosis, no clubbing Skin:  No rashes no nodules Neuro:  CN II through XII intact, motor grossly intact  DEVICE  Normal device function.  See PaceArt for details.   Assess/Plan: 1. Sinus node dysfunction - she is doing very well, s/p PPM insertion 2. HTN - her blood pressure is well controlled. 3. PPM - her [employer] DDD PM is working normally. 4 years of battery longevity remains. Will recheck in several months.  4. Atrial fib - she has up to 2 minutes of atrial fib. While she is at risk for stroke, she is also at risk for falling as she has broken her should and pelvis and ribs over the past year. Will continue ASA. If she has more atrial fib and no falls, I would consider Eliquis.  Mikle Bosworth.D.

## 2015-09-07 NOTE — Patient Instructions (Signed)
Medication Instructions:  Your physician recommends that you continue on your current medications as directed. Please refer to the Current Medication list given to you today.   Labwork: None ordered   Testing/Procedures: None ordered   Follow-Up: Your physician wants you to follow-up in: 12 months with Dr Knox Saliva will receive a reminder letter in the mail two months in advance. If you don't receive a letter, please call our office to schedule the follow-up appointment.  Remote monitoring is used to monitor your Pacemaker from home. This monitoring reduces the number of office visits required to check your device to one time per year. It allows [place] to keep an eye on the functioning of your device to ensure it is working properly. You are scheduled for a device check from home on 12/07/15. You may send your transmission at any time that day. If you have a wireless device, the transmission will be sent automatically. After your physician reviews your transmission, you will receive a postcard with your next transmission date.     Any Other Special Instructions Will Be Listed Below (If Applicable).     If you need a refill on your cardiac medications before your next appointment, please call your pharmacy.

## 2015-09-28 DIAGNOSIS — M79674 Pain in right toe(s): Secondary | ICD-10-CM | POA: Diagnosis not present

## 2015-09-28 DIAGNOSIS — M79671 Pain in right foot: Secondary | ICD-10-CM | POA: Diagnosis not present

## 2015-09-28 DIAGNOSIS — B351 Tinea unguium: Secondary | ICD-10-CM | POA: Diagnosis not present

## 2015-10-05 DIAGNOSIS — H35351 Cystoid macular degeneration, right eye: Secondary | ICD-10-CM | POA: Diagnosis not present

## 2015-10-05 DIAGNOSIS — H353222 Exudative age-related macular degeneration, left eye, with inactive choroidal neovascularization: Secondary | ICD-10-CM | POA: Diagnosis not present

## 2015-10-05 DIAGNOSIS — H1010 Acute atopic conjunctivitis, unspecified eye: Secondary | ICD-10-CM | POA: Diagnosis not present

## 2015-10-05 DIAGNOSIS — H353134 Nonexudative age-related macular degeneration, bilateral, advanced atrophic with subfoveal involvement: Secondary | ICD-10-CM | POA: Diagnosis not present

## 2015-10-06 DIAGNOSIS — E039 Hypothyroidism, unspecified: Secondary | ICD-10-CM | POA: Diagnosis not present

## 2015-10-06 DIAGNOSIS — D509 Iron deficiency anemia, unspecified: Secondary | ICD-10-CM | POA: Diagnosis not present

## 2015-10-06 DIAGNOSIS — E871 Hypo-osmolality and hyponatremia: Secondary | ICD-10-CM | POA: Diagnosis not present

## 2015-10-11 DIAGNOSIS — F5101 Primary insomnia: Secondary | ICD-10-CM | POA: Diagnosis not present

## 2015-10-11 DIAGNOSIS — M353 Polymyalgia rheumatica: Secondary | ICD-10-CM | POA: Diagnosis not present

## 2015-10-11 DIAGNOSIS — E871 Hypo-osmolality and hyponatremia: Secondary | ICD-10-CM | POA: Diagnosis not present

## 2015-10-11 DIAGNOSIS — I499 Cardiac arrhythmia, unspecified: Secondary | ICD-10-CM | POA: Diagnosis not present

## 2015-10-11 DIAGNOSIS — D509 Iron deficiency anemia, unspecified: Secondary | ICD-10-CM | POA: Diagnosis not present

## 2015-10-11 DIAGNOSIS — E039 Hypothyroidism, unspecified: Secondary | ICD-10-CM | POA: Diagnosis not present

## 2015-10-13 ENCOUNTER — Ambulatory Visit (HOSPITAL_COMMUNITY)
Admission: RE | Admit: 2015-10-13 | Discharge: 2015-10-13 | Disposition: A | Discharge status: Home / Self Care | Payer: Medicare Other | Source: Ambulatory Visit | Attending: Internal Medicine | Admitting: Internal Medicine

## 2015-10-13 ENCOUNTER — Other Ambulatory Visit (HOSPITAL_COMMUNITY): Payer: Self-pay | Admitting: Internal Medicine

## 2015-10-13 DIAGNOSIS — M545 Low back pain: Secondary | ICD-10-CM | POA: Diagnosis not present

## 2015-10-13 DIAGNOSIS — M419 Scoliosis, unspecified: Secondary | ICD-10-CM | POA: Diagnosis not present

## 2015-10-13 DIAGNOSIS — M5442 Lumbago with sciatica, left side: Secondary | ICD-10-CM

## 2015-10-13 DIAGNOSIS — M5441 Lumbago with sciatica, right side: Secondary | ICD-10-CM

## 2015-10-13 DIAGNOSIS — M5136 Other intervertebral disc degeneration, lumbar region: Secondary | ICD-10-CM | POA: Insufficient documentation

## 2015-11-01 DIAGNOSIS — R5383 Other fatigue: Secondary | ICD-10-CM | POA: Diagnosis not present

## 2015-11-01 DIAGNOSIS — M2550 Pain in unspecified joint: Secondary | ICD-10-CM | POA: Diagnosis not present

## 2015-11-01 DIAGNOSIS — D649 Anemia, unspecified: Secondary | ICD-10-CM | POA: Diagnosis not present

## 2015-11-01 DIAGNOSIS — D509 Iron deficiency anemia, unspecified: Secondary | ICD-10-CM | POA: Diagnosis not present

## 2015-11-01 DIAGNOSIS — M353 Polymyalgia rheumatica: Secondary | ICD-10-CM | POA: Diagnosis not present

## 2015-11-18 ENCOUNTER — Ambulatory Visit (HOSPITAL_COMMUNITY): Payer: Medicare Other

## 2015-11-21 DIAGNOSIS — Z79899 Other long term (current) drug therapy: Secondary | ICD-10-CM | POA: Diagnosis not present

## 2015-11-21 DIAGNOSIS — R531 Weakness: Secondary | ICD-10-CM | POA: Diagnosis not present

## 2015-11-21 DIAGNOSIS — R5383 Other fatigue: Secondary | ICD-10-CM | POA: Diagnosis not present

## 2015-11-21 DIAGNOSIS — M353 Polymyalgia rheumatica: Secondary | ICD-10-CM | POA: Diagnosis not present

## 2015-11-21 DIAGNOSIS — D509 Iron deficiency anemia, unspecified: Secondary | ICD-10-CM | POA: Diagnosis not present

## 2015-11-25 ENCOUNTER — Other Ambulatory Visit (HOSPITAL_COMMUNITY): Payer: Self-pay | Admitting: Adult Health Nurse Practitioner

## 2015-11-25 ENCOUNTER — Ambulatory Visit (HOSPITAL_COMMUNITY)
Admission: RE | Admit: 2015-11-25 | Discharge: 2015-11-25 | Disposition: A | Discharge status: Home / Self Care | Payer: Medicare Other | Source: Ambulatory Visit | Attending: Adult Health Nurse Practitioner | Admitting: Adult Health Nurse Practitioner

## 2015-11-25 DIAGNOSIS — R0602 Shortness of breath: Secondary | ICD-10-CM | POA: Diagnosis not present

## 2015-11-25 DIAGNOSIS — R05 Cough: Secondary | ICD-10-CM | POA: Diagnosis not present

## 2015-11-25 DIAGNOSIS — Z9889 Other specified postprocedural states: Secondary | ICD-10-CM | POA: Insufficient documentation

## 2015-11-25 DIAGNOSIS — R5383 Other fatigue: Secondary | ICD-10-CM

## 2015-11-25 DIAGNOSIS — M438X4 Other specified deforming dorsopathies, thoracic region: Secondary | ICD-10-CM | POA: Insufficient documentation

## 2015-11-25 DIAGNOSIS — J449 Chronic obstructive pulmonary disease, unspecified: Secondary | ICD-10-CM | POA: Diagnosis not present

## 2015-11-25 DIAGNOSIS — R531 Weakness: Secondary | ICD-10-CM

## 2015-11-25 DIAGNOSIS — I517 Cardiomegaly: Secondary | ICD-10-CM | POA: Diagnosis not present

## 2015-12-05 DIAGNOSIS — R51 Headache: Secondary | ICD-10-CM | POA: Diagnosis not present

## 2015-12-05 DIAGNOSIS — D509 Iron deficiency anemia, unspecified: Secondary | ICD-10-CM | POA: Diagnosis not present

## 2015-12-05 DIAGNOSIS — R531 Weakness: Secondary | ICD-10-CM | POA: Diagnosis not present

## 2015-12-05 DIAGNOSIS — R5383 Other fatigue: Secondary | ICD-10-CM | POA: Diagnosis not present

## 2015-12-07 ENCOUNTER — Ambulatory Visit (INDEPENDENT_AMBULATORY_CARE_PROVIDER_SITE_OTHER): Payer: Medicare Other | Admitting: *Deleted

## 2015-12-07 DIAGNOSIS — I495 Sick sinus syndrome: Secondary | ICD-10-CM

## 2015-12-07 NOTE — Progress Notes (Signed)
Remote pacemaker transmission.   

## 2015-12-08 ENCOUNTER — Encounter: Payer: Self-pay | Admitting: Cardiology

## 2015-12-09 LAB — CUP PACEART REMOTE DEVICE CHECK
Battery Remaining Longevity: 104 mo
Battery Remaining Percentage: 91 %
Battery Voltage: 2.95 V
Brady Statistic RA Percent Paced: 59 %
Brady Statistic RV Percent Paced: 1 %
Date Time Interrogation Session: 20170816060013
Implantable Lead Implant Date: 20130214
Implantable Lead Location: 753859
Lead Channel Impedance Value: 390 Ohm
Lead Channel Pacing Threshold Amplitude: 0.75 V
Lead Channel Pacing Threshold Pulse Width: 0.5 ms
Lead Channel Sensing Intrinsic Amplitude: 3.3 mV
Lead Channel Setting Sensing Sensitivity: 2 mV
MDC IDC LEAD IMPLANT DT: 20130214
MDC IDC LEAD LOCATION: 753860
MDC IDC MSMT LEADCHNL RV IMPEDANCE VALUE: 440 Ohm
MDC IDC MSMT LEADCHNL RV PACING THRESHOLD AMPLITUDE: 1.25 V
MDC IDC MSMT LEADCHNL RV PACING THRESHOLD PULSEWIDTH: 0.5 ms
MDC IDC MSMT LEADCHNL RV SENSING INTR AMPL: 12 mV
MDC IDC SET LEADCHNL RA PACING AMPLITUDE: 2 V
MDC IDC SET LEADCHNL RV PACING AMPLITUDE: 2.5 V
MDC IDC SET LEADCHNL RV PACING PULSEWIDTH: 0.5 ms
MDC IDC STAT BRADY AP VP PERCENT: 1 %
MDC IDC STAT BRADY AP VS PERCENT: 59 %
MDC IDC STAT BRADY AS VP PERCENT: 1 %
MDC IDC STAT BRADY AS VS PERCENT: 40 %
Pulse Gen Serial Number: 7316836

## 2015-12-14 ENCOUNTER — Encounter (HOSPITAL_COMMUNITY): Payer: Medicare Other | Attending: Hematology | Admitting: Hematology

## 2015-12-14 ENCOUNTER — Encounter (HOSPITAL_COMMUNITY): Payer: Medicare Other

## 2015-12-14 ENCOUNTER — Encounter (HOSPITAL_COMMUNITY): Payer: Self-pay | Admitting: Hematology

## 2015-12-14 VITALS — BP 148/48 | HR 61 | Temp 98.5°F | Resp 18 | Wt 134.1 lb

## 2015-12-14 DIAGNOSIS — E538 Deficiency of other specified B group vitamins: Secondary | ICD-10-CM | POA: Insufficient documentation

## 2015-12-14 DIAGNOSIS — D649 Anemia, unspecified: Secondary | ICD-10-CM

## 2015-12-14 LAB — CBC WITH DIFFERENTIAL/PLATELET
BASOS ABS: 0 10*3/uL (ref 0.0–0.1)
Basophils Relative: 0 %
Eosinophils Absolute: 0 10*3/uL (ref 0.0–0.7)
Eosinophils Relative: 0 %
HCT: 32.9 % — ABNORMAL LOW (ref 36.0–46.0)
Hemoglobin: 11.1 g/dL — ABNORMAL LOW (ref 12.0–15.0)
LYMPHS ABS: 1.5 10*3/uL (ref 0.7–4.0)
Lymphocytes Relative: 17 %
MCH: 33.3 pg (ref 26.0–34.0)
MCHC: 33.7 g/dL (ref 30.0–36.0)
MCV: 98.8 fL (ref 78.0–100.0)
MONO ABS: 0.7 10*3/uL (ref 0.1–1.0)
Monocytes Relative: 8 %
Neutro Abs: 6.6 10*3/uL (ref 1.7–7.7)
Neutrophils Relative %: 75 %
Platelets: 428 10*3/uL — ABNORMAL HIGH (ref 150–400)
RBC: 3.33 MIL/uL — AB (ref 3.87–5.11)
RDW: 17 % — AB (ref 11.5–15.5)
WBC: 8.8 10*3/uL (ref 4.0–10.5)

## 2015-12-14 LAB — VITAMIN B12: VITAMIN B 12: 577 pg/mL (ref 180–914)

## 2015-12-14 LAB — IRON AND TIBC
IRON: 75 ug/dL (ref 28–170)
Saturation Ratios: 28 % (ref 10.4–31.8)
TIBC: 270 ug/dL (ref 250–450)
UIBC: 195 ug/dL

## 2015-12-14 LAB — RETICULOCYTES
RBC.: 3.33 MIL/uL — AB (ref 3.87–5.11)
RETIC CT PCT: 5.7 % — AB (ref 0.4–3.1)
Retic Count, Absolute: 189.8 10*3/uL — ABNORMAL HIGH (ref 19.0–186.0)

## 2015-12-14 LAB — LACTATE DEHYDROGENASE: LDH: 167 U/L (ref 98–192)

## 2015-12-14 LAB — FERRITIN: Ferritin: 208 ng/mL (ref 11–307)

## 2015-12-14 NOTE — Progress Notes (Signed)
[name]  Date of Service: 12/14/2015  Patient Care Team: Celene Squibb, MD as PCP - General  CHIEF COMPLAINTS/PURPOSE OF CONSULTATION:  Anemia  HISTORY OF PRESENTING ILLNESS:  [name] is a wonderful 82 y.o. female who has been referred to [place] by Dr .Wende Neighbors, MD  for evaluation and management of Anemia.  Patient is a very pleasant 82 year old with a history of hypertension, dyslipidemia, hypothyroidism, osteoporosis, polymyalgia rheumatica reports that she had iron deficiency anemia for which she received IV iron more than a year ago.  She reports that over the last 2-3 months she has had some increased fatigue and some decreased appetite and weight loss of about 5 pounds. Her hemoglobin about a year ago on 12/15/2014 was 9 but then had improved to 12.3 on 06/25/2015. Patient's hemoglobin has since been lower at 10.4 on 06/30/2015 and 9.8 with an MCV of 98.4 on 07/01/2015. Recent labs with her primary care physician on 11/01/2015 showed a hemoglobin of 9.7 with an MCV of 94.6 RDW of 16.3. Her WBC count was within normal limits at 5.2k  and platelets were within normal limits at 347k.  Patient was referred for evaluation of her anemia. Patient reports that she is taking 1 tablet of ferrous sulfate daily for a long time. Notes no overt GI bleeding or other bleeding. Notes chronic constipation but no new changes in her bowel habits. Colonoscopy was done a few years ago and other than a few polyps.  Notes that her polymyalgia rheumatica has acted up over the last several months and she has been on prednisone which initially was tapered from 30 mg daily to 0 over 2-3 weeks but her pain returned and so she has been placed back on 30 mg daily which has not been tapered yet. Patient notes that she has been feeling fatigued and weak and rather blah  since since being on the prednisone. Sedimentation rate was borderline elevated at 49 which is not significantly  elevated in the setting of her age.  No other acute new focal symptoms. Has had several falls with osteoporotic fractures in the last several years.  No fevers no chills no night sweats know what enlarged lymph nodes. No other specific focal symptoms at this time.   Has been on chronic PPI therapy for 4-5 years . MEDICAL HISTORY:  Past Medical History:  Diagnosis Date  . Anemia   . Bradycardia   . Constipation   . Degenerative joint disease   . Diverticulosis   . External hemorrhoids   . GERD (gastroesophageal reflux disease)   . Goiter, unspecified   . Hyperlipidemia   . Hypertension   . Hypothyroidism   . Macular degeneration   . Osteoporosis   . Pacemaker   . Palpitations   . Seasonal allergies   . Thyrotoxicosis without mention of goiter or other cause, without mention of thyrotoxic crisis or storm     SURGICAL HISTORY: Past Surgical History:  Procedure Laterality Date  . COLONOSCOPY  November 2011   External hemorrhoids, diverticulosis, anal papillae  . COLONOSCOPY N/A 04/01/2014   Procedure: COLONOSCOPY;  Surgeon: Rogene Houston, MD;  [facility];  Service: Endoscopy;  Laterality: N/A;  930  . FEMUR IM NAIL Left 12/29/2012   Procedure: INTRAMEDULLARY (IM) NAIL INTERTROCHANTRIC;  Surgeon: Melina Schools, MD;  Location: [place];  Service: Orthopedics;  Laterality: Left;  . INSERT / REPLACE / REMOVE PACEMAKER    . PERMANENT PACEMAKER  INSERTION N/A 06/07/2011   Procedure: PERMANENT PACEMAKER INSERTION;  Surgeon: Evans Lance, MD;  Location: [facility] CATH LAB;  Service: Cardiovascular;  Laterality: N/A;  . THYROIDECTOMY    . TONSILLECTOMY    . vertebroplasty      SOCIAL HISTORY: Social History   Social History  . Marital status: Widowed    Spouse name: N/A  . Number of children: N/A  . Years of education: N/A   Occupational History  . Not on file.   Social History Main Topics  . Smoking status: Never Smoker  . Smokeless tobacco: Never Used  . Alcohol  use No  . Drug use: No  . Sexual activity: Not on file   Other Topics Concern  . Not on file   Social History Narrative  . No narrative on file    FAMILY HISTORY: Family History  Problem Relation Age of Onset  . Heart attack Mother     Deceased  . Cancer Father     Deceased, colon cancer age 80  . Cancer Brother     Deceased, throat and lung    ALLERGIES:  is allergic to tramadol and sulfa antibiotics.  MEDICATIONS:  Current Outpatient Prescriptions  Medication Sig Dispense Refill  . acetaminophen (TYLENOL) 500 MG tablet Take 1,000 mg by mouth every 8 (eight) hours as needed for mild pain.    Marland Kitchen ALPRAZolam (XANAX) 0.25 MG tablet Take 0.25 mg by mouth at bedtime as needed for sleep.    Marland Kitchen amLODipine (NORVASC) 5 MG tablet Take 5 mg by mouth at bedtime.     Marland Kitchen amoxicillin-clavulanate (AUGMENTIN) 500-125 MG tablet Take 1 tablet by mouth 2 (two) times daily. Started 03/03 for 10 days    . aspirin 325 MG tablet Take 325 mg by mouth daily.    . benzonatate (TESSALON) 200 MG capsule Take 200 mg by mouth 3 (three) times daily as needed for cough.    . Calcium Carbonate-Vitamin D (CALCIUM 600+D) 600-400 MG-UNIT tablet Take 1 tablet by mouth 2 (two) times daily.    . carbamide peroxide (DEBROX) 6.5 % otic solution Place 1 drop into both ears daily.     . carvedilol (COREG) 6.25 MG tablet Take 1 tablet (6.25 mg total) by mouth 2 (two) times daily with a meal. 60 tablet 0  . cycloSPORINE (RESTASIS) 0.05 % ophthalmic emulsion Place 1 drop into both eyes 2 (two) times daily. *Self Administration*    . Dextromethorphan-Guaifenesin (MUCINEX DM MAXIMUM STRENGTH) 60-1200 MG TB12 Take 1 tablet by mouth 2 (two) times daily. Started 03/03 for 1 week  0  . diphenhydrAMINE (BENYLIN) 12.5 MG/5ML syrup Take 5 mLs (12.5 mg total) by mouth 4 (four) times daily as needed for itching or allergies. 120 mL 0  . docusate sodium (COLACE) 100 MG capsule Take 100 mg by mouth at bedtime. For constipation    .  fluticasone (FLONASE) 50 MCG/ACT nasal spray Place 2 sprays into the nose daily. For allergies    . hydrALAZINE (APRESOLINE) 25 MG tablet Take 25 mg by mouth 2 (two) times daily.     Marland Kitchen HYDROcodone-acetaminophen (NORCO/VICODIN) 5-325 MG tablet Take 1 tablet by mouth every 8 (eight) hours as needed for moderate pain.    Marland Kitchen Hypertonic Nasal Wash (SINUS RINSE NA) Place 1 application into both nostrils daily.    . Iron-FA-B Cmp-C-Biot-Probiotic (FUSION PLUS PO) Take 1 capsule by mouth daily.    Marland Kitchen levothyroxine (SYNTHROID, LEVOTHROID) 100 MCG tablet Take 100 mcg by mouth daily before  breakfast.    . loperamide (IMODIUM) 2 MG capsule Take 2 mg by mouth every 4 (four) hours as needed for diarrhea or loose stools.    Marland Kitchen loratadine (CLARITIN) 10 MG tablet Take 10 mg by mouth daily.    . Magnesium 250 MG TABS Take 1 tablet by mouth at bedtime.     . magnesium hydroxide (MILK OF MAGNESIA) 400 MG/5ML suspension Take 0.5 mLs by mouth as needed for mild constipation.    . Melatonin 1 MG TABS Take 1 mg by mouth at bedtime. For rest    . methocarbamol (ROBAXIN) 500 MG tablet Take 1 tablet (500 mg total) by mouth every 6 (six) hours as needed for muscle spasms. 30 tablet 0  . Multiple Vitamins-Minerals (CENTRUM SILVER) tablet Take 1 tablet by mouth 2 (two) times daily.    . multivitamin-lutein (OCUVITE-LUTEIN) CAPS capsule Take 1 capsule by mouth 2 (two) times daily.    Marland Kitchen omeprazole (PRILOSEC) 20 MG capsule Take 20 mg by mouth daily. Reported on 06/25/2015    . Oxycodone HCl 10 MG TABS Take 1 tablet (10 mg total) by mouth 4 (four) times daily as needed. 20 tablet 0  . polyethylene glycol (MIRALAX / GLYCOLAX) packet Take 17 g by mouth daily.    . predniSONE (DELTASONE) 2.5 MG tablet Take 2.5 mg by mouth daily.    . Probiotic Product (ALIGN) 4 MG CAPS Take 1 capsule by mouth daily.    . sertraline (ZOLOFT) 50 MG tablet Take 50 mg by mouth daily.    . Wheat Dextrin (BENEFIBER DRINK MIX PO) Take 10 mLs by mouth daily with  breakfast. Mix 2 teaspoonfuls with water and drink daily.     No current facility-administered medications for this visit.     REVIEW OF SYSTEMS:    10 Point review of Systems was done is negative except as noted above.  PHYSICAL EXAMINATION: ECOG PERFORMANCE STATUS: 3 - Symptomatic, >50% confined to bed  . Vitals:   12/14/15 0955  BP: (!) 148/48  Pulse: 61  Resp: 18  Temp: 98.5 F (36.9 C)   Filed Weights   12/14/15 0955  Weight: 134 lb 1 oz (60.8 kg)   .Body mass index is 21.97 kg/m.  GENERAL: elderly frail appearing caucasian female, alert, in no acute distress and comfortable SKIN: skin color, texture, turgor are normal, no rashes or significant lesions EYES: normal, conjunctiva are pink and non-injected, sclera clear OROPHARYNX:no exudate, no erythema and lips, buccal mucosa, and tongue normal  NECK: supple, no JVD, thyroid normal size, non-tender, without nodularity LYMPH:  no palpable lymphadenopathy in the cervical, axillary or inguinal LUNGS: clear to auscultation with normal respiratory effort HEART: regular rate & rhythm,  no murmurs and no lower extremity edema ABDOMEN: abdomen soft, non-tender, normoactive bowel sounds  Musculoskeletal: no cyanosis of digits and no clubbing  PSYCH: alert & oriented x 3 with fluent speech NEURO: no focal motor/sensory deficits  LABORATORY DATA:  I have reviewed the data as listed  . CBC Latest Ref Rng & Units 07/01/2015 06/30/2015 06/25/2015  WBC 4.0 - 10.5 K/uL 5.8 5.6 11.0(H)  Hemoglobin 12.0 - 15.0 g/dL 9.8(L) 10.4(L) 12.3  Hematocrit 36.0 - 46.0 % 29.9(L) 29.8(L) 36.1  Platelets 150 - 400 K/uL 330 316 385    . CMP Latest Ref Rng & Units 07/01/2015 06/30/2015 06/25/2015  Glucose 65 - 99 mg/dL 103(H) 109(H) 103(H)  BUN 6 - 20 mg/dL 16 15 13   Creatinine 0.44 - 1.00 mg/dL 1.10(H) 1.07(H)  0.84  Sodium 135 - 145 mmol/L 138 135 136  Potassium 3.5 - 5.1 mmol/L 4.1 5.0 4.5  Chloride 101 - 111 mmol/L 99(L) 98(L) 98(L)  CO2 22 -  32 mmol/L 27 28 27   Calcium 8.9 - 10.3 mg/dL 9.3 9.4 10.1  Total Protein 6.5 - 8.1 g/dL - - 7.1  Total Bilirubin 0.3 - 1.2 mg/dL - - 0.9  Alkaline Phos 38 - 126 U/L - - 69  AST 15 - 41 U/L - - 35  ALT 14 - 54 U/L - - 16     RADIOGRAPHIC STUDIES: I have personally reviewed the radiological images as listed and agreed with the findings in the report. Dg Chest 2 View  Result Date: 11/25/2015 CLINICAL DATA:  Intermittent dry cough and shortness of breath and fatigue for the past 3 weeks. EXAM: CHEST  2 VIEW COMPARISON:  06/25/2015 and chest CT dated 06/30/2015. FINDINGS: The cardiac silhouette remains enlarged. Stable left subclavian pacemaker leads. The lungs are mildly hyperexpanded with mildly prominent interstitial markings and mild central peribronchial thickening. Previously demonstrated kyphoplasty material within a compressed thoracic vertebral body with interval kyphoplasty material in 2 adjacent previously demonstrated compressed vertebral bodies. Diffuse osteopenia. Mild thoracic spine degenerative changes. Atheromatous arterial calcifications in the thoracic aorta. IMPRESSION: 1. No acute abnormality. 2. Stable cardiomegaly and changes of COPD and chronic bronchitis. 3. 3 thoracic vertebral compression deformities with kyphoplasty material. Electronically Signed   By: Claudie Revering M.D.   On: 11/25/2015 09:43    ASSESSMENT & PLAN:   82 year old female with multiple medical comorbidities currently residing in assisted living at [facility] referred for evaluation of anemia.  #1 Normocytic anemia with high normal MCV and borderline elevated RDW. Normal WBC count and platelets. Patient apparently has had previous iron deficiency anemia without clear etiology and received IV iron about a year or so ago as per her report. The given her high normal MCV this is unlikely to be only iron deficiency. Several possibilities for her normocytic anemia including - anemia of chronic disease due to  inflammation, some element of chronic kidney disease given her age, cannot rule out the possibility of myelodysplastic syndrome.  Plan -CBC with differential and reticulocyte count -LDH and haptoglobin to rule out hemolysis -Myeloma panel and serum free light chains -Erythropoietin level -Ferritin, B12 and RBC folate. -Peripheral blood smear review. -Patient recommended to continue her ferrous sulfate 1 tablet by mouth daily at this time. Depending on ferritin level might need to adjust this dose or give her IV iron to maintain ferritin of more than 100 in the setting of chronic kidney disease and likely some element of MDS. -Reasonable to take 1 tablet of vitamin B complex daily due to absorption issues in the setting of chronic PPI therapy. -Follow-up with Dr. [place] Crane for continued management of her polymyalgia rheumatica and steroid taper since her fatigue could certainly be from her steroids causing early steroid myopathy. Her current level of anemia does not explain her significant fatigue. -Initial workup is non-definitive... We will need to discuss pros and cons of considering bone marrow biopsy versus empirically treating with IV iron to maintain ferritin level of > 100 and considering ESA to keep her hgb in the 10-11 range if needed.  Return to care in 1 month with repeat CBC. Earlier if initial labs show any other new concerns.  All of the patients questions were answered with apparent satisfaction. The patient knows to call the clinic with any problems, questions or concerns.  I spent 50 minutes counseling the patient face to face. The total time spent in the appointment was 60 minutes and more than 50% was on counseling and direct patient cares.    Sullivan Lone MD [place] AAHIVMS [facility] Hematology/Oncology Physician [facility]  (Office):       [phone] (Work cell):  [phone] (Fax):           [phone]  12/14/2015 10:06 AM

## 2015-12-14 NOTE — Patient Instructions (Addendum)
[place] at [facility]  Discharge Instructions:  You were seen by MD Irene Limbo today.  Labs will be done after this visit.  Follow up with provider in 4 weeks.   _______________________________________________________________  Thank you for choosing [place] at [facility] to provide your oncology and hematology care.  To afford each patient quality time with our providers, please arrive at least 15 minutes before your scheduled appointment.  You need to re-schedule your appointment if you arrive 10 or more minutes late.  We strive to give you quality time with our providers, and arriving late affects you and other patients whose appointments are after yours.  Also, if you no show three or more times for appointments you may be dismissed from the clinic.  Again, thank you for choosing [place] at [place] hope is that these requests will allow you access to exceptional care and in a timely manner. _______________________________________________________________  If you have questions after your visit, please contact our office at [phone] between the hours of 8:30 a.m. and 5:00 p.m. Voicemails left after 4:30 p.m. will not be returned until the following business day. _______________________________________________________________  For prescription refill requests, have your pharmacy contact our office. _______________________________________________________________  Recommendations made by the consultant and any test results will be sent to your referring physician. _______________________________________________________________

## 2015-12-15 DIAGNOSIS — B351 Tinea unguium: Secondary | ICD-10-CM | POA: Diagnosis not present

## 2015-12-15 DIAGNOSIS — M79674 Pain in right toe(s): Secondary | ICD-10-CM | POA: Diagnosis not present

## 2015-12-15 DIAGNOSIS — M79671 Pain in right foot: Secondary | ICD-10-CM | POA: Diagnosis not present

## 2015-12-15 LAB — KAPPA/LAMBDA LIGHT CHAINS
KAPPA FREE LGHT CHN: 205.1 mg/L — AB (ref 3.3–19.4)
Kappa, lambda light chain ratio: 21.14 — ABNORMAL HIGH (ref 0.26–1.65)
Lambda free light chains: 9.7 mg/L (ref 5.7–26.3)

## 2015-12-15 LAB — PATHOLOGIST SMEAR REVIEW

## 2015-12-15 LAB — HAPTOGLOBIN: HAPTOGLOBIN: 34 mg/dL (ref 34–200)

## 2015-12-15 LAB — FOLATE RBC
FOLATE, HEMOLYSATE: 608.7 ng/mL
FOLATE, RBC: 1920 ng/mL (ref >498)
HEMATOCRIT: 31.7 % — AB (ref 34.0–46.6)

## 2015-12-15 LAB — ERYTHROPOIETIN: Erythropoietin: 30.1 m[IU]/mL — ABNORMAL HIGH (ref 2.6–18.5)

## 2015-12-16 LAB — MULTIPLE MYELOMA PANEL, SERUM
ALBUMIN SERPL ELPH-MCNC: 4.1 g/dL (ref 2.9–4.4)
Albumin/Glob SerPl: 1.6 (ref 0.7–1.7)
Alpha 1: 0.3 g/dL (ref 0.0–0.4)
Alpha2 Glob SerPl Elph-Mcnc: 0.6 g/dL (ref 0.4–1.0)
B-Globulin SerPl Elph-Mcnc: 0.8 g/dL (ref 0.7–1.3)
GAMMA GLOB SERPL ELPH-MCNC: 1 g/dL (ref 0.4–1.8)
Globulin, Total: 2.6 g/dL (ref 2.2–3.9)
IGA: 711 mg/dL — AB (ref 64–422)
IgG (Immunoglobin G), Serum: 384 mg/dL — ABNORMAL LOW (ref 700–1600)
IgM, Serum: 24 mg/dL — ABNORMAL LOW (ref 26–217)
M Protein SerPl Elph-Mcnc: 0.6 g/dL — ABNORMAL HIGH
Total Protein ELP: 6.7 g/dL (ref 6.0–8.5)

## 2015-12-20 ENCOUNTER — Other Ambulatory Visit: Payer: Self-pay

## 2015-12-27 DIAGNOSIS — R51 Headache: Secondary | ICD-10-CM | POA: Diagnosis not present

## 2015-12-27 DIAGNOSIS — R531 Weakness: Secondary | ICD-10-CM | POA: Diagnosis not present

## 2015-12-27 DIAGNOSIS — D649 Anemia, unspecified: Secondary | ICD-10-CM | POA: Diagnosis not present

## 2015-12-27 DIAGNOSIS — R5383 Other fatigue: Secondary | ICD-10-CM | POA: Diagnosis not present

## 2016-01-11 ENCOUNTER — Encounter (HOSPITAL_COMMUNITY): Payer: Medicare Other

## 2016-01-11 ENCOUNTER — Encounter (HOSPITAL_COMMUNITY): Payer: Medicare Other | Attending: Hematology & Oncology | Admitting: Hematology & Oncology

## 2016-01-11 VITALS — BP 154/59 | HR 64 | Temp 98.5°F | Resp 16 | Wt 136.0 lb

## 2016-01-11 DIAGNOSIS — M353 Polymyalgia rheumatica: Secondary | ICD-10-CM

## 2016-01-11 DIAGNOSIS — D649 Anemia, unspecified: Secondary | ICD-10-CM

## 2016-01-11 DIAGNOSIS — D890 Polyclonal hypergammaglobulinemia: Secondary | ICD-10-CM | POA: Diagnosis not present

## 2016-01-11 DIAGNOSIS — Z8639 Personal history of other endocrine, nutritional and metabolic disease: Secondary | ICD-10-CM

## 2016-01-11 DIAGNOSIS — D472 Monoclonal gammopathy: Secondary | ICD-10-CM

## 2016-01-11 LAB — CBC WITH DIFFERENTIAL/PLATELET
Basophils Absolute: 0 10*3/uL (ref 0.0–0.1)
Basophils Relative: 1 %
EOS ABS: 0.1 10*3/uL (ref 0.0–0.7)
Eosinophils Relative: 2 %
HEMATOCRIT: 31.9 % — AB (ref 36.0–46.0)
HEMOGLOBIN: 10.4 g/dL — AB (ref 12.0–15.0)
LYMPHS ABS: 1.5 10*3/uL (ref 0.7–4.0)
Lymphocytes Relative: 28 %
MCH: 33.9 pg (ref 26.0–34.0)
MCHC: 32.6 g/dL (ref 30.0–36.0)
MCV: 103.9 fL — ABNORMAL HIGH (ref 78.0–100.0)
MONOS PCT: 15 %
Monocytes Absolute: 0.8 10*3/uL (ref 0.1–1.0)
NEUTROS ABS: 3 10*3/uL (ref 1.7–7.7)
NEUTROS PCT: 54 %
Platelets: 305 10*3/uL (ref 150–400)
RBC: 3.07 MIL/uL — ABNORMAL LOW (ref 3.87–5.11)
RDW: 16.7 % — ABNORMAL HIGH (ref 11.5–15.5)
WBC: 5.5 10*3/uL (ref 4.0–10.5)

## 2016-01-11 LAB — DIRECT ANTIGLOBULIN TEST (NOT AT ARMC)
DAT, IGG: NEGATIVE
DAT, complement: NEGATIVE

## 2016-01-11 LAB — LACTATE DEHYDROGENASE: LDH: 194 U/L — AB (ref 98–192)

## 2016-01-11 LAB — RETICULOCYTES
RBC.: 2.99 MIL/uL — ABNORMAL LOW (ref 3.87–5.11)
RETIC COUNT ABSOLUTE: 92.7 10*3/uL (ref 19.0–186.0)
RETIC CT PCT: 3.1 % (ref 0.4–3.1)

## 2016-01-11 NOTE — Progress Notes (Signed)
HEMATOLOGY/ONCOLOGY PROGRESS NOTE  Date of Service: 01/11/2016  Patient Care Team: Celene Squibb, MD as PCP - General  CHIEF COMPLAINTS/PURPOSE OF CONSULTATION:  Anemia  HISTORY OF PRESENTING ILLNESS:  [name] is a wonderful 82 y.o. female who has been referred to [place] by Dr .Wende Neighbors, MD  for evaluation and management of Anemia.  Patient is a very pleasant 82 year old with a history of hypertension, dyslipidemia, hypothyroidism, osteoporosis, polymyalgia rheumatica reports that she had iron deficiency anemia for which she received IV iron more than a year ago.  She reports that over the last 2-3 months she has had some increased fatigue and some decreased appetite and weight loss of about 5 pounds. Her hemoglobin about a year ago on 12/15/2014 was 9 but then had improved to 12.3 on 06/25/2015. Patient's hemoglobin has since been lower at 10.4 on 06/30/2015 and 9.8 with an MCV of 98.4 on 07/01/2015. Recent labs with her primary care physician on 11/01/2015 showed a hemoglobin of 9.7 with an MCV of 94.6 RDW of 16.3. Her WBC count was within normal limits at 5.2k  and platelets were within normal limits at 347k.  Patient was referred for evaluation of her anemia. Patient reports that she is taking 1 tablet of ferrous sulfate daily for a long time. Notes no overt GI bleeding or other bleeding. Notes chronic constipation but no new changes in her bowel habits. Colonoscopy was done a few years ago and other than a few polyps.  Notes that her polymyalgia rheumatica has acted up over the last several months and she has been on prednisone which initially was tapered from 30 mg daily to 0 over 2-3 weeks but her pain returned and so she has been placed back on 30 mg daily which has not been tapered yet. Patient notes that she has been feeling fatigued and weak and rather blah  since since being on the prednisone. Sedimentation rate was borderline elevated at 49 which is not significantly elevated in  the setting of her age.  No other acute new focal symptoms. Has had several falls with osteoporotic fractures in the last several years.  No fevers no chills no night sweats know what enlarged lymph nodes. No other specific focal symptoms at this time.   Has been on chronic PPI therapy for 4-5 years .  She stays at [facility] assisted living facility. I personally reviewed and went over laboratory results with the patient. She returns today to review laboratory studies obtained at her initial visit. She denies any changes in her activity or PS since her last visit.  No new pain, nausea, change in bowel or bladder.    MEDICAL HISTORY:  Past Medical History:  Diagnosis Date  . Anemia   . Bradycardia   . Constipation   . Degenerative joint disease   . Diverticulosis   . External hemorrhoids   . GERD (gastroesophageal reflux disease)   . Goiter, unspecified   . Hyperlipidemia   . Hypertension   . Hypothyroidism   . Macular degeneration   . Osteoporosis   . Pacemaker   . Palpitations   . Seasonal allergies   . Thyrotoxicosis without mention of goiter or other cause, without mention of thyrotoxic crisis or storm     SURGICAL HISTORY: Past Surgical History:  Procedure Laterality Date  . COLONOSCOPY  November 2011   External hemorrhoids, diverticulosis, anal papillae  . COLONOSCOPY N/A 04/01/2014   Procedure: COLONOSCOPY;  Surgeon: Rogene Houston, MD;  [facility];  Service: Endoscopy;  Laterality: N/A;  930  . FEMUR IM NAIL Left 12/29/2012   Procedure: INTRAMEDULLARY (IM) NAIL INTERTROCHANTRIC;  Surgeon: Melina Schools, MD;  Location: [place];  Service: Orthopedics;  Laterality: Left;  . INSERT / REPLACE / REMOVE PACEMAKER    . PERMANENT PACEMAKER INSERTION N/A 06/07/2011   Procedure: PERMANENT PACEMAKER INSERTION;  Surgeon: Evans Lance, MD;  Location: [facility] CATH LAB;  Service: Cardiovascular;  Laterality: N/A;  . THYROIDECTOMY    . TONSILLECTOMY    . vertebroplasty       SOCIAL HISTORY: Social History   Social History  . Marital status: Widowed    Spouse name: N/A  . Number of children: N/A  . Years of education: N/A   Occupational History  . Not on file.   Social History Main Topics  . Smoking status: Never Smoker  . Smokeless tobacco: Never Used  . Alcohol use No  . Drug use: No  . Sexual activity: Not on file   Other Topics Concern  . Not on file   Social History Narrative  . No narrative on file    FAMILY HISTORY: Family History  Problem Relation Age of Onset  . Heart attack Mother     Deceased  . Cancer Father     Deceased, colon cancer age 48  . Cancer Brother     Deceased, throat and lung    ALLERGIES:  is allergic to tramadol and sulfa antibiotics.  MEDICATIONS:  Current Outpatient Prescriptions  Medication Sig Dispense Refill  . acetaminophen (TYLENOL) 500 MG tablet Take 1,000 mg by mouth every 8 (eight) hours as needed for mild pain.    Marland Kitchen ALPRAZolam (XANAX) 0.25 MG tablet Take 0.25 mg by mouth at bedtime as needed for sleep.    Marland Kitchen amLODipine (NORVASC) 5 MG tablet Take 5 mg by mouth at bedtime.     Marland Kitchen amoxicillin-clavulanate (AUGMENTIN) 500-125 MG tablet Take 1 tablet by mouth 2 (two) times daily. Started 03/03 for 10 days    . aspirin 81 MG tablet Take 81 mg by mouth daily.     . benzonatate (TESSALON) 200 MG capsule Take 200 mg by mouth 3 (three) times daily as needed for cough.    . Calcium Carbonate Antacid (CAL-GEST ANTACID PO) Take by mouth.    . Calcium Carbonate-Vitamin D (CALCIUM 600+D) 600-400 MG-UNIT tablet Take 1 tablet by mouth 2 (two) times daily.    . carbamide peroxide (DEBROX) 6.5 % otic solution Place 1 drop into both ears daily.     . carvedilol (COREG) 6.25 MG tablet Take 1 tablet (6.25 mg total) by mouth 2 (two) times daily with a meal. 60 tablet 0  . cycloSPORINE (RESTASIS) 0.05 % ophthalmic emulsion Place 1 drop into both eyes 2 (two) times daily. *Self Administration*    .  Dextromethorphan-Guaifenesin (MUCINEX DM MAXIMUM STRENGTH) 60-1200 MG TB12 Take 1 tablet by mouth 2 (two) times daily. Started 03/03 for 1 week  0  . diphenhydrAMINE (BENYLIN) 12.5 MG/5ML syrup Take 5 mLs (12.5 mg total) by mouth 4 (four) times daily as needed for itching or allergies. 120 mL 0  . docusate sodium (COLACE) 100 MG capsule Take 100 mg by mouth at bedtime. For constipation    . Ferrous Sulfate Dried (FERROUS SULFATE CR PO) Take by mouth.    . fluticasone (FLONASE) 50 MCG/ACT nasal spray Place 2 sprays into the nose daily. For allergies    . hydrALAZINE (APRESOLINE) 25 MG  tablet Take 25 mg by mouth 2 (two) times daily.     Marland Kitchen HYDROcodone-acetaminophen (NORCO/VICODIN) 5-325 MG tablet Take 1 tablet by mouth every 8 (eight) hours as needed for moderate pain.    . hydrocortisone (ANUSOL-HC) 2.5 % rectal cream Place 1 application rectally 2 (two) times daily.    . Hypertonic Nasal Wash (SINUS RINSE NA) Place 1 application into both nostrils daily.    . Iron-FA-B Cmp-C-Biot-Probiotic (FUSION PLUS PO) Take 1 capsule by mouth daily.    Marland Kitchen levothyroxine (SYNTHROID, LEVOTHROID) 100 MCG tablet Take 100 mcg by mouth daily before breakfast.    . loperamide (IMODIUM) 2 MG capsule Take 2 mg by mouth every 4 (four) hours as needed for diarrhea or loose stools.    Marland Kitchen loratadine (CLARITIN) 10 MG tablet Take 10 mg by mouth daily.    . Magnesium 250 MG TABS Take 1 tablet by mouth at bedtime.     . magnesium hydroxide (MILK OF MAGNESIA) 400 MG/5ML suspension Take 0.5 mLs by mouth as needed for mild constipation.    . methocarbamol (ROBAXIN) 500 MG tablet Take 1 tablet (500 mg total) by mouth every 6 (six) hours as needed for muscle spasms. 30 tablet 0  . Multiple Vitamins-Minerals (CENTRUM SILVER) tablet Take 1 tablet by mouth 2 (two) times daily.    . multivitamin-lutein (OCUVITE-LUTEIN) CAPS capsule Take 1 capsule by mouth 2 (two) times daily.    Marland Kitchen omeprazole (PRILOSEC) 20 MG capsule Take 20 mg by mouth  daily. Reported on 06/25/2015    . Oxycodone HCl 10 MG TABS Take 1 tablet (10 mg total) by mouth 4 (four) times daily as needed. 20 tablet 0  . polyethylene glycol (MIRALAX / GLYCOLAX) packet Take 17 g by mouth daily.    . predniSONE (DELTASONE) 2.5 MG tablet Take 2.5 mg by mouth daily.    . Probiotic Product (ALIGN) 4 MG CAPS Take 1 capsule by mouth daily.    . sertraline (ZOLOFT) 50 MG tablet Take 50 mg by mouth daily.    . Wheat Dextrin (BENEFIBER DRINK MIX PO) Take 10 mLs by mouth daily with breakfast. Mix 2 teaspoonfuls with water and drink daily.     No current facility-administered medications for this visit.     REVIEW OF SYSTEMS:   Review of Systems  Constitutional: Negative.   HENT: Negative.   Eyes: Negative.   Respiratory: Negative.   Cardiovascular: Negative.   Gastrointestinal: Negative.   Genitourinary: Negative.   Musculoskeletal: Negative.   Skin: Negative.   Neurological: Negative.   Endo/Heme/Allergies: Negative.   Psychiatric/Behavioral: Negative.   All other systems reviewed and are negative. 14 point review of systems was performed and is negative except as detailed under history of present illness and above   PHYSICAL EXAMINATION: ECOG PERFORMANCE STATUS: 3 - Symptomatic, >50% confined to bed  Vitals - 1 value per visit 123456  SYSTOLIC 123456  DIASTOLIC 59  Pulse 64  Temperature 98.5  Respirations 16  Weight (lb) 136  Height   BMI 22.29  VISIT REPORT   .  GENERAL: elderly frail appearing caucasian female, alert, in no acute distress and comfortable SKIN: skin color, texture, turgor are normal, no rashes or significant lesions EYES: normal, conjunctiva are pink and non-injected, sclera clear OROPHARYNX:no exudate, no erythema and lips, buccal mucosa, and tongue normal  NECK: supple, no JVD, thyroid normal size, non-tender, without nodularity LYMPH:  no palpable lymphadenopathy in the cervical, axillary or inguinal LUNGS: clear to auscultation  with normal  respiratory effort HEART: regular rate & rhythm,  no murmurs and no lower extremity edema ABDOMEN: abdomen soft, non-tender, normoactive bowel sounds  Musculoskeletal: no cyanosis of digits and no clubbing  PSYCH: alert & oriented x 3 with fluent speech NEURO: no focal motor/sensory deficits  LABORATORY DATA:  I have reviewed the data as listed  . CBC Latest Ref Rng & Units 01/11/2016 12/14/2015 12/14/2015  WBC 4.0 - 10.5 K/uL 5.5 8.8 -  Hemoglobin 12.0 - 15.0 g/dL 10.4(L) 11.1(L) -  Hematocrit 36.0 - 46.0 % 31.9(L) 32.9(L) 31.7(L)  Platelets 150 - 400 K/uL 305 428(H) -    . CMP Latest Ref Rng & Units 07/01/2015 06/30/2015 06/25/2015  Glucose 65 - 99 mg/dL 103(H) 109(H) 103(H)  BUN 6 - 20 mg/dL 16 15 13   Creatinine 0.44 - 1.00 mg/dL 1.10(H) 1.07(H) 0.84  Sodium 135 - 145 mmol/L 138 135 136  Potassium 3.5 - 5.1 mmol/L 4.1 5.0 4.5  Chloride 101 - 111 mmol/L 99(L) 98(L) 98(L)  CO2 22 - 32 mmol/L 27 28 27   Calcium 8.9 - 10.3 mg/dL 9.3 9.4 10.1  Total Protein 6.5 - 8.1 g/dL - - 7.1  Total Bilirubin 0.3 - 1.2 mg/dL - - 0.9  Alkaline Phos 38 - 126 U/L - - 69  AST 15 - 41 U/L - - 35  ALT 14 - 54 U/L - - 16   Results for ALEXISS, RICARTE (MRN [id number])   Ref. Range 12/14/2015 10:57 12/14/2015 10:57  Iron Latest Ref Range: 28 - 170 ug/dL 75   UIBC Latest Units: ug/dL 195   TIBC Latest Ref Range: 250 - 450 ug/dL 270   Saturation Ratios Latest Ref Range: 10.4 - 31.8 % 28   Ferritin Latest Ref Range: 11 - 307 ng/mL 208   Folate, Hemolysate Latest Ref Range: Not Estab. ng/mL  608.7  Folate, RBC Latest Ref Range: >498 ng/mL  1,920  Vitamin B12 Latest Ref Range: 180 - 914 pg/mL 577    Results for NIQUITA, KOSTELECKY (MRN [id number])  Ref. Range 12/14/2015 10:57  LDH Latest Ref Range: 98 - 192 U/L 167   Results for TYRHONDA, VONBERGEN (MRN [id number])   Ref. Range 12/14/2015 10:57  Kappa free light chain Latest Ref Range: 3.3 - 19.4 mg/L 205.1 (H)  Lamda free light chains Latest Ref Range: 5.7 -  26.3 mg/L 9.7  Kappa, lamda light chain ratio Latest Ref Range: 0.26 - 1.65  21.14 (H)        RADIOGRAPHIC STUDIES: I have personally reviewed the radiological images as listed and agreed with the findings in the report. No results found.  ASSESSMENT & PLAN:  Normocytic anemia HX iron deficiency HX IV iron replacement Polymyalgia rheumatica   82 year old female with multiple medical comorbidities currently residing in assisted living at [facility] referred for evaluation of anemia.  #1 Normocytic anemia with high normal MCV and borderline elevated RDW. Normal WBC count and platelets. Patient apparently has had previous iron deficiency anemia without clear etiology and received IV iron about a year or so ago as per her report. Currently however, no frank iron deficiency noted.   I reviewed her labs obtained at her initial visit. She has an abnormal kappa/lambda light chain ratio, small M spike and suppressed IgG. Haptoglobin is low normal, LDH and biliirubin are WNL.  I discussed several options moving forward. She has agreed to [facility], which realistically will be quite helpful evaluating her anemia.  We will schedule and regroup about 1 1/2 weeks  post to review. All questions were answered regarding the procedure. This will be scheduled in IR at [facility].   All of the patients questions were answered with apparent satisfaction. The patient knows to call the clinic with any problems, questions or concerns.  This document serves as a record of services personally performed by Ancil Linsey, MD. It was created on her behalf by Arlyce Harman, a trained medical scribe. The creation of this record is based on the scribe's personal observations and the provider's statements to them. This document has been checked and approved by the attending provider.  I have reviewed the above documentation for accuracy and completeness and I agree with the above.  Kelby Fam. Penland, MD 01/11/2016 12:25  PM

## 2016-01-11 NOTE — Patient Instructions (Addendum)
[place] at [facility] Discharge Instructions  RECOMMENDATIONS MADE BY THE CONSULTANT AND ANY TEST RESULTS WILL BE SENT TO YOUR REFERRING PHYSICIAN.  You saw Dr. Whitney Muse today. Labs today. Bone marrow biopsy. Follow up 2 weeks after bone marrow biopsy.    Thank you for choosing [place] at [facility] to provide your oncology and hematology care.  To afford each patient quality time with our provider, please arrive at least 15 minutes before your scheduled appointment time.   Beginning January 23rd 2017 lab work for the [facility] will be done in the  Main lab at [facility] on 1st floor. If you have a lab appointment with the [facility] please come in thru the  Main Entrance and check in at the main information desk  You need to re-schedule your appointment should you arrive 10 or more minutes late.  We strive to give you quality time with our providers, and arriving late affects you and other patients whose appointments are after yours.  Also, if you no show three or more times for appointments you may be dismissed from the clinic at the providers discretion.     Again, thank you for choosing [facility].  Our hope is that these requests will decrease the amount of time that you wait before being seen by our physicians.       _____________________________________________________________  Should you have questions after your visit to [facility], please contact our office at [phone] between the hours of 8:30 a.m. and 4:30 p.m.  Voicemails left after 4:30 p.m. will not be returned until the following business day.  For prescription refill requests, have your pharmacy contact our office.         Resources For Cancer Patients and their Caregivers ? American Cancer Society: Can assist with transportation, wigs, general needs, runs Look Good Feel Better.        ([phone] [phone] ? Cancer  Care: Provides financial assistance, online support groups, medication/co-pay assistance.  1-800-813-HOPE (581)875-8194) ? [facility] Assists [place] cancer patients and their families through emotional , educational and financial support.  661-348-7470 ? [place] DSS Where to apply for food stamps, Medicaid and utility assistance. 303-810-7520 ? [facility]: Transportation to medical appointments. 587-715-0698 ? Social Security Administration: May apply for disability if have a Stage IV cancer. 906-543-9117 (713)812-6987 ? [employer], Disability and Transit Services: Assists with nutrition, care and transit needs. [place] Support Programs: @10RELATIVEDAYS @ > Cancer Support Group  2nd Tuesday of the month 1pm-2pm, Journey Room  > Creative Journey  3rd Tuesday of the month 1130am-1pm, Journey Room  > Look Good Feel Better  1st Wednesday of the month 10am-12 noon, Journey Room (Call [place] to register [phone])

## 2016-01-12 ENCOUNTER — Telehealth (HOSPITAL_COMMUNITY): Payer: Self-pay | Admitting: Hematology & Oncology

## 2016-01-12 LAB — HAPTOGLOBIN: Haptoglobin: 51 mg/dL (ref 34–200)

## 2016-01-12 NOTE — Telephone Encounter (Signed)
CALLED PTS SON TO NOTIFY HIM OF HER APPT ON 10/19

## 2016-01-17 ENCOUNTER — Other Ambulatory Visit: Payer: Self-pay | Admitting: Radiology

## 2016-01-17 DIAGNOSIS — Z23 Encounter for immunization: Secondary | ICD-10-CM | POA: Diagnosis not present

## 2016-01-18 ENCOUNTER — Other Ambulatory Visit: Payer: Self-pay | Admitting: Radiology

## 2016-01-19 ENCOUNTER — Other Ambulatory Visit: Payer: Self-pay | Admitting: General Surgery

## 2016-01-20 ENCOUNTER — Ambulatory Visit (HOSPITAL_COMMUNITY)
Admission: RE | Admit: 2016-01-20 | Discharge: 2016-01-20 | Disposition: A | Discharge status: Home / Self Care | Payer: Medicare Other | Source: Ambulatory Visit | Attending: Hematology & Oncology | Admitting: Hematology & Oncology

## 2016-01-20 ENCOUNTER — Encounter (HOSPITAL_COMMUNITY): Payer: Self-pay | Admitting: Hematology & Oncology

## 2016-01-20 ENCOUNTER — Encounter (HOSPITAL_COMMUNITY): Payer: Self-pay

## 2016-01-20 DIAGNOSIS — K219 Gastro-esophageal reflux disease without esophagitis: Secondary | ICD-10-CM | POA: Diagnosis not present

## 2016-01-20 DIAGNOSIS — Z7982 Long term (current) use of aspirin: Secondary | ICD-10-CM | POA: Diagnosis not present

## 2016-01-20 DIAGNOSIS — E785 Hyperlipidemia, unspecified: Secondary | ICD-10-CM | POA: Diagnosis not present

## 2016-01-20 DIAGNOSIS — Z950 Presence of cardiac pacemaker: Secondary | ICD-10-CM | POA: Diagnosis not present

## 2016-01-20 DIAGNOSIS — C9000 Multiple myeloma not having achieved remission: Secondary | ICD-10-CM | POA: Diagnosis not present

## 2016-01-20 DIAGNOSIS — C9030 Solitary plasmacytoma not having achieved remission: Secondary | ICD-10-CM | POA: Diagnosis not present

## 2016-01-20 DIAGNOSIS — I10 Essential (primary) hypertension: Secondary | ICD-10-CM | POA: Insufficient documentation

## 2016-01-20 DIAGNOSIS — D472 Monoclonal gammopathy: Secondary | ICD-10-CM

## 2016-01-20 DIAGNOSIS — D649 Anemia, unspecified: Secondary | ICD-10-CM | POA: Insufficient documentation

## 2016-01-20 DIAGNOSIS — D890 Polyclonal hypergammaglobulinemia: Secondary | ICD-10-CM | POA: Insufficient documentation

## 2016-01-20 DIAGNOSIS — E039 Hypothyroidism, unspecified: Secondary | ICD-10-CM | POA: Insufficient documentation

## 2016-01-20 DIAGNOSIS — Z881 Allergy status to other antibiotic agents status: Secondary | ICD-10-CM | POA: Insufficient documentation

## 2016-01-20 DIAGNOSIS — R001 Bradycardia, unspecified: Secondary | ICD-10-CM | POA: Diagnosis not present

## 2016-01-20 DIAGNOSIS — H3530 Unspecified macular degeneration: Secondary | ICD-10-CM | POA: Insufficient documentation

## 2016-01-20 DIAGNOSIS — Z885 Allergy status to narcotic agent status: Secondary | ICD-10-CM | POA: Diagnosis not present

## 2016-01-20 DIAGNOSIS — D539 Nutritional anemia, unspecified: Secondary | ICD-10-CM | POA: Diagnosis not present

## 2016-01-20 DIAGNOSIS — D7589 Other specified diseases of blood and blood-forming organs: Secondary | ICD-10-CM | POA: Insufficient documentation

## 2016-01-20 DIAGNOSIS — D4989 Neoplasm of unspecified behavior of other specified sites: Secondary | ICD-10-CM | POA: Diagnosis not present

## 2016-01-20 DIAGNOSIS — E0590 Thyrotoxicosis, unspecified without thyrotoxic crisis or storm: Secondary | ICD-10-CM | POA: Diagnosis not present

## 2016-01-20 LAB — CBC WITH DIFFERENTIAL/PLATELET
BASOS PCT: 1 %
Basophils Absolute: 0 10*3/uL (ref 0.0–0.1)
EOS ABS: 0.1 10*3/uL (ref 0.0–0.7)
EOS PCT: 1 %
HCT: 28.4 % — ABNORMAL LOW (ref 36.0–46.0)
HEMOGLOBIN: 9.4 g/dL — AB (ref 12.0–15.0)
LYMPHS ABS: 1.6 10*3/uL (ref 0.7–4.0)
Lymphocytes Relative: 38 %
MCH: 33.2 pg (ref 26.0–34.0)
MCHC: 33.1 g/dL (ref 30.0–36.0)
MCV: 100.4 fL — ABNORMAL HIGH (ref 78.0–100.0)
MONOS PCT: 17 %
Monocytes Absolute: 0.7 10*3/uL (ref 0.1–1.0)
NEUTROS PCT: 43 %
Neutro Abs: 1.8 10*3/uL (ref 1.7–7.7)
PLATELETS: 356 10*3/uL (ref 150–400)
RBC: 2.83 MIL/uL — ABNORMAL LOW (ref 3.87–5.11)
RDW: 16.9 % — ABNORMAL HIGH (ref 11.5–15.5)
WBC: 4.2 10*3/uL (ref 4.0–10.5)

## 2016-01-20 LAB — BASIC METABOLIC PANEL
Anion gap: 8 (ref 5–15)
BUN: 21 mg/dL — AB (ref 6–20)
CALCIUM: 9.2 mg/dL (ref 8.9–10.3)
CHLORIDE: 101 mmol/L (ref 101–111)
CO2: 28 mmol/L (ref 22–32)
CREATININE: 0.96 mg/dL (ref 0.44–1.00)
GFR calc non Af Amer: 51 mL/min — ABNORMAL LOW (ref >60)
GFR, EST AFRICAN AMERICAN: 59 mL/min — AB (ref >60)
Glucose, Bld: 85 mg/dL (ref 65–99)
Potassium: 3.5 mmol/L (ref 3.5–5.1)
Sodium: 137 mmol/L (ref 135–145)

## 2016-01-20 LAB — BONE MARROW EXAM

## 2016-01-20 LAB — PROTIME-INR
INR: 1.04
PROTHROMBIN TIME: 13.6 s (ref 11.4–15.2)

## 2016-01-20 MED ORDER — MIDAZOLAM HCL 2 MG/2ML IJ SOLN
INTRAMUSCULAR | Status: AC
  Filled 2016-01-20: qty 4

## 2016-01-20 MED ORDER — FENTANYL CITRATE (PF) 100 MCG/2ML IJ SOLN
INTRAMUSCULAR | Status: AC
  Filled 2016-01-20: qty 4

## 2016-01-20 MED ORDER — MIDAZOLAM HCL 2 MG/2ML IJ SOLN
INTRAMUSCULAR | Status: AC | PRN
  Administered 2016-01-20 (×2): 1 mg via INTRAVENOUS

## 2016-01-20 MED ORDER — SODIUM CHLORIDE 0.9 % IV SOLN
INTRAVENOUS | Status: DC
  Administered 2016-01-20: 10:00:00 via INTRAVENOUS

## 2016-01-20 MED ORDER — FENTANYL CITRATE (PF) 100 MCG/2ML IJ SOLN
INTRAMUSCULAR | Status: AC | PRN
  Administered 2016-01-20: 50 ug via INTRAVENOUS

## 2016-01-20 NOTE — Progress Notes (Signed)
Referring Physician(s): Patrici Ranks  Supervising Physician: Sandi Mariscal  Patient Status:  Outpatient  Chief Complaint:  "I'm getting a bone marrow biopsy"  Subjective:  Pt familiar to IR service from prior T6 vertebroplasty in 2013 followed by T7 and T8 vertebroplasties in March of this year. She has a history of anemia as well as elevated lambda/kappa light chain ratio/IgA monoclonal protein and presents again today for CT guided bone marrow biopsy for further evaluation. Additional history as below. She currently denies fever,CP,dyspnea, N/V or bleeding. She does have occ HA's, occ cough, some occ abd/low back pain and fatigue.  Past Medical History:  Diagnosis Date  . Anemia   . Bradycardia   . Constipation   . Degenerative joint disease   . Diverticulosis   . External hemorrhoids   . GERD (gastroesophageal reflux disease)   . Goiter, unspecified   . Hyperlipidemia   . Hypertension   . Hypothyroidism   . Macular degeneration   . Osteoporosis   . Pacemaker   . Palpitations   . Seasonal allergies   . Thyrotoxicosis without mention of goiter or other cause, without mention of thyrotoxic crisis or storm    Past Surgical History:  Procedure Laterality Date  . COLONOSCOPY  November 2011   External hemorrhoids, diverticulosis, anal papillae  . COLONOSCOPY N/A 04/01/2014   Procedure: COLONOSCOPY;  Surgeon: Rogene Houston, MD;  Location: [facility];  Service: Endoscopy;  Laterality: N/A;  930  . FEMUR IM NAIL Left 12/29/2012   Procedure: INTRAMEDULLARY (IM) NAIL INTERTROCHANTRIC;  Surgeon: Melina Schools, MD;  Location: [place];  Service: Orthopedics;  Laterality: Left;  . INSERT / REPLACE / REMOVE PACEMAKER    . PERMANENT PACEMAKER INSERTION N/A 06/07/2011   Procedure: PERMANENT PACEMAKER INSERTION;  Surgeon: Evans Lance, MD;  Location: [facility];  Service: Cardiovascular;  Laterality: N/A;  . THYROIDECTOMY    . TONSILLECTOMY    . vertebroplasty        Allergies: Tramadol and Sulfa antibiotics  Medications: Prior to Admission medications   Medication Sig Start Date End Date Taking? Authorizing Provider  acetaminophen (TYLENOL) 500 MG tablet Take 1,000 mg by mouth every 8 (eight) hours as needed for mild pain.    Historical Provider, MD  ALPRAZolam Duanne Moron) 0.25 MG tablet Take 0.25 mg by mouth at bedtime as needed for sleep.    Historical Provider, MD  amLODipine (NORVASC) 5 MG tablet Take 5 mg by mouth at bedtime.  08/11/14   Historical Provider, MD  aspirin 81 MG tablet Take 81 mg by mouth daily.     Historical Provider, MD  Calcium Carbonate Antacid (CAL-GEST ANTACID PO) Take by mouth.    Historical Provider, MD  carvedilol (COREG) 6.25 MG tablet Take 1 tablet (6.25 mg total) by mouth 2 (two) times daily with a meal. 07/02/15   Thurnell Lose, MD  cycloSPORINE (RESTASIS) 0.05 % ophthalmic emulsion Place 1 drop into both eyes 2 (two) times daily. *Self Administration*    Historical Provider, MD  diphenhydrAMINE (BENYLIN) 12.5 MG/5ML syrup Take 5 mLs (12.5 mg total) by mouth 4 (four) times daily as needed for itching or allergies. 07/02/15   Thurnell Lose, MD  docusate sodium (COLACE) 100 MG capsule Take 100 mg by mouth at bedtime. For constipation 01/01/13   Ripudeep Krystal Eaton, MD  Ferrous Sulfate Dried (FERROUS SULFATE CR PO) Take by mouth.    Historical Provider, MD  fluticasone (FLONASE) 50 MCG/ACT nasal spray Place 2  sprays into the nose daily. For allergies    Historical Provider, MD  hydrALAZINE (APRESOLINE) 10 MG tablet Take 10 mg by mouth 2 (two) times daily.    Historical Provider, MD  HYDROcodone-acetaminophen (NORCO/VICODIN) 5-325 MG tablet Take 1 tablet by mouth every 8 (eight) hours as needed for moderate pain.    Historical Provider, MD  hydrocortisone (ANUSOL-HC) 2.5 % rectal cream Place 1 application rectally 2 (two) times daily.    Historical Provider, MD  Hypertonic Nasal Wash (SINUS RINSE NA) Place 1 application into  both nostrils daily.    Historical Provider, MD  Iron-FA-B Cmp-C-Biot-Probiotic (FUSION PLUS PO) Take 1 capsule by mouth daily.    Historical Provider, MD  levothyroxine (SYNTHROID, LEVOTHROID) 100 MCG tablet Take 100 mcg by mouth daily before breakfast.    Historical Provider, MD  loperamide (IMODIUM) 2 MG capsule Take 2 mg by mouth every 4 (four) hours as needed for diarrhea or loose stools.    Historical Provider, MD  loratadine (CLARITIN) 10 MG tablet Take 10 mg by mouth daily.    Historical Provider, MD  Magnesium 250 MG TABS Take 1 tablet by mouth at bedtime.     Historical Provider, MD  magnesium hydroxide (MILK OF MAGNESIA) 400 MG/5ML suspension Take 0.5 mLs by mouth as needed for mild constipation.    Historical Provider, MD  methocarbamol (ROBAXIN) 500 MG tablet Take 1 tablet (500 mg total) by mouth every 6 (six) hours as needed for muscle spasms. 11/23/14   Radene Gunning, NP  Multiple Vitamins-Minerals (CENTRUM SILVER) tablet Take 1 tablet by mouth 2 (two) times daily.    Historical Provider, MD  multivitamin-lutein (OCUVITE-LUTEIN) CAPS capsule Take 1 capsule by mouth 2 (two) times daily.    Historical Provider, MD  omeprazole (PRILOSEC) 20 MG capsule Take 20 mg by mouth daily. Reported on 06/25/2015 12/12/12   Historical Provider, MD  polyethylene glycol (MIRALAX / GLYCOLAX) packet Take 17 g by mouth daily.    Historical Provider, MD  sertraline (ZOLOFT) 50 MG tablet Take 50 mg by mouth daily.    Historical Provider, MD  tiZANidine (ZANAFLEX) 4 MG tablet Take 4 mg by mouth daily. 01/05/16   Historical Provider, MD  Wheat Dextrin (BENEFIBER DRINK MIX PO) Take 10 mLs by mouth daily with breakfast. Mix 2 teaspoonfuls with water and drink daily.    Historical Provider, MD     Vital Signs: Vitals:   01/20/16 0917  BP: (!) 173/60  Pulse: 70  Resp: 16  Temp: 97.8 F (36.6 C)     Physical Exam patient awake, alert. Chest clear to auscultation bilaterally. Heart with regular rate and  rhythm. Left chest wall pacer noted; heart with regular rate and rhythm. Abdomen soft, positive bowel sounds, mild generalized tenderness to palpation; lower extremities- no edema  Imaging: No results found.  Labs:  CBC:  Recent Labs  06/30/15 1630 07/01/15 0550 12/14/15 1057 01/11/16 1109  WBC 5.6 5.8 8.8 5.5  HGB 10.4* 9.8* 11.1* 10.4*  HCT 29.8* 29.9* 32.9*  31.7* 31.9*  PLT 316 330 428* 305    COAGS:  Recent Labs  06/25/15 1350 07/01/15 0550  INR 1.02 1.20    BMP:  Recent Labs  06/25/15 1350 06/30/15 1630 07/01/15 0550  NA 136 135 138  K 4.5 5.0 4.1  CL 98* 98* 99*  CO2 27 28 27   GLUCOSE 103* 109* 103*  BUN 13 15 16   CALCIUM 10.1 9.4 9.3  CREATININE 0.84 1.07* 1.10*  GFRNONAA 59*  44* 43*  GFRAA >60 51* 50*    LIVER FUNCTION TESTS:  Recent Labs  06/25/15 1350  BILITOT 0.9  AST 35  ALT 16  ALKPHOS 69  PROT 7.1  ALBUMIN 4.8    Assessment and Plan: Pt with history of anemia as well as elevated lambda/kappa light chain ratio/IgA monoclonal protein ; plan today is for CT guided bone marrow biopsy for further evaluation.Risks and benefits discussed with the patient/son including, but not limited to bleeding, infection, damage to adjacent structures or low yield requiring additional tests.All of the patient's questions were answered, patient is agreeable to proceed.Consent signed and in chart. Labs pending.      Electronically Signed: D. Rowe Robert 01/20/2016, 9:11 AM   I spent a total of 20 minutes at the the patient's bedside AND on the patient's hospital floor or unit, greater than 50% of which was counseling/coordinating care for CT guided bone marrow biopsy    Patient ID: [name], female   DOB: 03/08/1923, 82 y.o.   MRN: [id number]

## 2016-01-20 NOTE — Procedures (Signed)
Technically successful CT guided bone marrow aspiration and biopsy of left iliac crest. EBL: None No immediate complications.    SignedSandi Mariscal [phone] 01/20/2016, 11:48 AM

## 2016-01-20 NOTE — Discharge Instructions (Signed)
Moderate Conscious Sedation, Adult, Care After °Refer to this sheet in the next few weeks. These instructions provide you with information on caring for yourself after your procedure. Your health care provider may also give you more specific instructions. Your treatment has been planned according to current medical practices, but problems sometimes occur. Call your health care provider if you have any problems or questions after your procedure. °WHAT TO EXPECT AFTER THE PROCEDURE  °After your procedure: °· You may feel sleepy, clumsy, and have poor balance for several hours. °· Vomiting may occur if you eat too soon after the procedure. °HOME CARE INSTRUCTIONS °· Do not participate in any activities where you could become injured for at least 24 hours. Do not: °¨ Drive. °¨ Swim. °¨ Ride a bicycle. °¨ Operate heavy machinery. °¨ Cook. °¨ Use power tools. °¨ Climb ladders. °¨ Work from a high place. °· Do not make important decisions or sign legal documents until you are improved. °· If you vomit, drink water, juice, or soup when you can drink without vomiting. Make sure you have little or no nausea before eating solid foods. °· Only take over-the-counter or prescription medicines for pain, discomfort, or fever as directed by your health care provider. °· Make sure you and your family fully understand everything about the medicines given to you, including what side effects may occur. °· You should not drink alcohol, take sleeping pills, or take medicines that cause drowsiness for at least 24 hours. °· If you smoke, do not smoke without supervision. °· If you are feeling better, you may resume normal activities 24 hours after you were sedated. °· Keep all appointments with your health care provider. °SEEK MEDICAL CARE IF: °· Your skin is pale or bluish in color. °· You continue to feel nauseous or vomit. °· Your pain is getting worse and is not helped by medicine. °· You have bleeding or swelling. °· You are still  sleepy or feeling clumsy after 24 hours. °SEEK IMMEDIATE MEDICAL CARE IF: °· You develop a rash. °· You have difficulty breathing. °· You develop any type of allergic problem. °· You have a fever. °MAKE SURE YOU: °· Understand these instructions. °· Will watch your condition. °· Will get help right away if you are not doing well or get worse. °  °This information is not intended to replace advice given to you by your health care provider. Make sure you discuss any questions you have with your health care provider. °  °Document Released: 01/28/2013 Document Revised: 04/30/2014 Document Reviewed: 01/28/2013 °Elsevier Interactive Patient Education ©2016 [employer] °Bone Marrow Aspiration and Bone Marrow Biopsy, Care After °Refer to this sheet in the next few weeks. These instructions provide you with information about caring for yourself after your procedure. Your health care provider may also give you more specific instructions. Your treatment has been planned according to current medical practices, but problems sometimes occur. Call your health care provider if you have any problems or questions after your procedure. °WHAT TO EXPECT AFTER THE PROCEDURE °After your procedure, it is common to have: °· Soreness or tenderness around the puncture site. °· Bruising. °HOME CARE INSTRUCTIONS °· Take medicines only as directed by your health care provider. °· Follow your health care provider's instructions about: °¨ Puncture site care. °¨ Bandage (dressing) changes and removal. °· Bathe and shower as directed by your health care provider. °· Check your puncture site every day for signs of infection. Watch for: °¨ Redness, swelling, or pain. °¨   Fluid, blood, or pus. °· Return to your normal activities as directed by your health care provider. °· Keep all follow-up visits as directed by your health care provider. This is important. °SEEK MEDICAL CARE IF: °· You have a fever. °· You have uncontrollable bleeding. °· You have  redness, swelling, or pain at the site of your puncture. °· You have fluid, blood, or pus coming from your puncture site. °  °This information is not intended to replace advice given to you by your health care provider. Make sure you discuss any questions you have with your health care provider. °  °Document Released: 10/27/2004 Document Revised: 08/24/2014 Document Reviewed: 03/31/2014 °Elsevier Interactive Patient Education ©2016 [employer] ° °

## 2016-01-27 ENCOUNTER — Telehealth (HOSPITAL_COMMUNITY): Payer: Self-pay | Admitting: Emergency Medicine

## 2016-01-27 NOTE — Telephone Encounter (Signed)
Notified Clair Gulling of blood work and that blood work from bone marrow would not be back until next office visit.

## 2016-02-01 ENCOUNTER — Telehealth (HOSPITAL_COMMUNITY): Payer: Self-pay

## 2016-02-01 NOTE — Telephone Encounter (Signed)
-----  Message from Baird Cancer, PA-C sent at 01/31/2016  4:58 PM EDT ----- Regarding: RE: wants results Findings suspicious for bone marrow disorder (multiple myeloma).  Appt is scheduled for 10/19. TK ----- Message ----- From: Coralie Keens Elizibeth Breau, RN Sent: 01/27/2016   1:52 PM To: Baird Cancer, PA-C, Patrici Ranks, MD Subject: Melton Alar: wants results                              Please advise ----- Message ----- From: Louis Meckel Sent: 01/27/2016  12:20 PM To: Onc Nurse Ap Subject: wants results                                  Patients son called and has not heard from blood or test that was done last week.  Please call him.  Thanks

## 2016-02-01 NOTE — Telephone Encounter (Signed)
Left a message on son's voicemail that all test results would be discussed at the appointment on the 17th of this month.

## 2016-02-03 LAB — TISSUE HYBRIDIZATION (BONE MARROW)-NCBH

## 2016-02-03 LAB — CHROMOSOME ANALYSIS, BONE MARROW

## 2016-02-06 ENCOUNTER — Encounter (HOSPITAL_COMMUNITY): Payer: Self-pay

## 2016-02-08 NOTE — Progress Notes (Signed)
HEMATOLOGY/ONCOLOGY PROGRESS NOTE  Date of Service: 02/08/2016  Patient Care Team: Celene Squibb, MD as PCP - General  CHIEF COMPLAINTS/PURPOSE OF CONSULTATION:  Anemia  HISTORY OF PRESENTING ILLNESS:  [name] is a wonderful 82 y.o. female who has been referred to [place] by Dr .Wende Neighbors, MD  for evaluation and management of Anemia. She presents today for ongoing follow-up   Patient is a very pleasant 82 year old with a history of hypertension, dyslipidemia, hypothyroidism, osteoporosis, polymyalgia rheumatica reports that she had iron deficiency anemia for which she received IV iron more than a year ago.  [name] returns to the [facility] today with her son to review recent BMBX.  She has no new complaints. Appetite is unchanged. Energy is the same. Breathing is the same.   MEDICAL HISTORY:  Past Medical History:  Diagnosis Date  . Anemia   . Bradycardia   . Constipation   . Degenerative joint disease   . Diverticulosis   . External hemorrhoids   . GERD (gastroesophageal reflux disease)   . Goiter, unspecified   . Hyperlipidemia   . Hypertension   . Hypothyroidism   . Macular degeneration   . Osteoporosis   . Pacemaker   . Palpitations   . Seasonal allergies   . Thyrotoxicosis without mention of goiter or other cause, without mention of thyrotoxic crisis or storm     SURGICAL HISTORY: Past Surgical History:  Procedure Laterality Date  . COLONOSCOPY  November 2011   External hemorrhoids, diverticulosis, anal papillae  . COLONOSCOPY N/A 04/01/2014   Procedure: COLONOSCOPY;  Surgeon: Rogene Houston, MD;  [facility];  Service: Endoscopy;  Laterality: N/A;  930  . FEMUR IM NAIL Left 12/29/2012   Procedure: INTRAMEDULLARY (IM) NAIL INTERTROCHANTRIC;  Surgeon: Melina Schools, MD;  Location: [facility];  Service: Orthopedics;  Laterality: Left;  . INSERT / REPLACE / REMOVE PACEMAKER    . PERMANENT PACEMAKER INSERTION N/A 06/07/2011   Procedure: PERMANENT  PACEMAKER INSERTION;  Surgeon: Evans Lance, MD;  Location: [facility] CATH LAB;  Service: Cardiovascular;  Laterality: N/A;  . THYROIDECTOMY    . TONSILLECTOMY    . vertebroplasty      SOCIAL HISTORY: Social History   Social History  . Marital status: Widowed    Spouse name: N/A  . Number of children: N/A  . Years of education: N/A   Occupational History  . Not on file.   Social History Main Topics  . Smoking status: Never Smoker  . Smokeless tobacco: Never Used  . Alcohol use No  . Drug use: No  . Sexual activity: Not on file   Other Topics Concern  . Not on file   Social History Narrative  . No narrative on file    FAMILY HISTORY: Family History  Problem Relation Age of Onset  . Heart attack Mother     Deceased  . Cancer Father     Deceased, colon cancer age 69  . Cancer Brother     Deceased, throat and lung    ALLERGIES:  is allergic to tramadol and sulfa antibiotics.  MEDICATIONS:  Current Outpatient Prescriptions  Medication Sig Dispense Refill  . acetaminophen (TYLENOL) 500 MG tablet Take 1,000 mg by mouth every 8 (eight) hours as needed for mild pain.    Marland Kitchen ALPRAZolam (XANAX) 0.25 MG tablet Take 0.25 mg by mouth at bedtime as needed for sleep.    Marland Kitchen amLODipine (NORVASC) 5 MG tablet Take 5 mg  by mouth at bedtime.     Marland Kitchen aspirin 81 MG tablet Take 81 mg by mouth daily.     . Calcium Carbonate Antacid (CAL-GEST ANTACID PO) Take by mouth.    . carvedilol (COREG) 6.25 MG tablet Take 1 tablet (6.25 mg total) by mouth 2 (two) times daily with a meal. 60 tablet 0  . cycloSPORINE (RESTASIS) 0.05 % ophthalmic emulsion Place 1 drop into both eyes 2 (two) times daily. *Self Administration*    . diphenhydrAMINE (BENYLIN) 12.5 MG/5ML syrup Take 5 mLs (12.5 mg total) by mouth 4 (four) times daily as needed for itching or allergies. 120 mL 0  . docusate sodium (COLACE) 100 MG capsule Take 100 mg by mouth at bedtime. For constipation    . Ferrous Sulfate Dried (FERROUS SULFATE  CR PO) Take by mouth.    . fluticasone (FLONASE) 50 MCG/ACT nasal spray Place 2 sprays into the nose daily. For allergies    . hydrALAZINE (APRESOLINE) 10 MG tablet Take 10 mg by mouth 2 (two) times daily.    Marland Kitchen HYDROcodone-acetaminophen (NORCO/VICODIN) 5-325 MG tablet Take 1 tablet by mouth every 8 (eight) hours as needed for moderate pain.    . hydrocortisone (ANUSOL-HC) 2.5 % rectal cream Place 1 application rectally 2 (two) times daily.    . Hypertonic Nasal Wash (SINUS RINSE NA) Place 1 application into both nostrils daily.    . Iron-FA-B Cmp-C-Biot-Probiotic (FUSION PLUS PO) Take 1 capsule by mouth daily.    Marland Kitchen levothyroxine (SYNTHROID, LEVOTHROID) 100 MCG tablet Take 100 mcg by mouth daily before breakfast.    . loperamide (IMODIUM) 2 MG capsule Take 2 mg by mouth every 4 (four) hours as needed for diarrhea or loose stools.    Marland Kitchen loratadine (CLARITIN) 10 MG tablet Take 10 mg by mouth daily.    . Magnesium 250 MG TABS Take 1 tablet by mouth at bedtime.     . magnesium hydroxide (MILK OF MAGNESIA) 400 MG/5ML suspension Take 0.5 mLs by mouth as needed for mild constipation.    . methocarbamol (ROBAXIN) 500 MG tablet Take 1 tablet (500 mg total) by mouth every 6 (six) hours as needed for muscle spasms. 30 tablet 0  . Multiple Vitamins-Minerals (CENTRUM SILVER) tablet Take 1 tablet by mouth 2 (two) times daily.    . multivitamin-lutein (OCUVITE-LUTEIN) CAPS capsule Take 1 capsule by mouth 2 (two) times daily.    Marland Kitchen omeprazole (PRILOSEC) 20 MG capsule Take 20 mg by mouth daily. Reported on 06/25/2015    . polyethylene glycol (MIRALAX / GLYCOLAX) packet Take 17 g by mouth daily.    . sertraline (ZOLOFT) 50 MG tablet Take 50 mg by mouth daily.    Marland Kitchen tiZANidine (ZANAFLEX) 4 MG tablet Take 4 mg by mouth daily.    . Wheat Dextrin (BENEFIBER DRINK MIX PO) Take 10 mLs by mouth daily with breakfast. Mix 2 teaspoonfuls with water and drink daily.     No current facility-administered medications for this visit.      REVIEW OF SYSTEMS:   Review of Systems  Constitutional: Negative.   HENT: Negative.   Eyes: Negative.   Respiratory: Negative.   Cardiovascular: Negative.   Gastrointestinal: Negative.   Genitourinary: Negative.   Musculoskeletal: Negative.   Skin: Negative.   Neurological: Negative.   Endo/Heme/Allergies: Negative.   Psychiatric/Behavioral: Negative.   All other systems reviewed and are negative. 14 point review of systems was performed and is negative except as detailed under history of present illness and above  PHYSICAL EXAMINATION: ECOG PERFORMANCE STATUS: 3 - Symptomatic, >50% confined to bed  Vitals - 1 value per visit 2/63/7858  SYSTOLIC 850  DIASTOLIC 59  Pulse 64  Temperature 98.5  Respirations 16  Weight (lb) 136  Height   BMI 22.29  VISIT REPORT   .  GENERAL: elderly frail appearing caucasian female, alert, in no acute distress and comfortable SKIN: skin color, texture, turgor are normal, no rashes or significant lesions EYES: normal, conjunctiva are pink and non-injected, sclera clear OROPHARYNX:no exudate, no erythema and lips, buccal mucosa, and tongue normal  NECK: supple, no JVD, thyroid normal size, non-tender, without nodularity LYMPH:  no palpable lymphadenopathy in the cervical, axillary or inguinal LUNGS: clear to auscultation with normal respiratory effort HEART: regular rate & rhythm,  no murmurs and no lower extremity edema ABDOMEN: abdomen soft, non-tender, normoactive bowel sounds  Musculoskeletal: no cyanosis of digits and no clubbing  PSYCH: alert & oriented x 3 with fluent speech NEURO: no focal motor/sensory deficits  LABORATORY DATA:  I have reviewed the data as listed  . CBC Latest Ref Rng & Units 01/20/2016 01/11/2016 12/14/2015  WBC 4.0 - 10.5 K/uL 4.2 5.5 8.8  Hemoglobin 12.0 - 15.0 g/dL 9.4(L) 10.4(L) 11.1(L)  Hematocrit 36.0 - 46.0 % 28.4(L) 31.9(L) 32.9(L)  Platelets 150 - 400 K/uL 356 305 428(H)    . CMP Latest Ref  Rng & Units 01/20/2016 07/01/2015 06/30/2015  Glucose 65 - 99 mg/dL 85 103(H) 109(H)  BUN 6 - 20 mg/dL 21(H) 16 15  Creatinine 0.44 - 1.00 mg/dL 0.96 1.10(H) 1.07(H)  Sodium 135 - 145 mmol/L 137 138 135  Potassium 3.5 - 5.1 mmol/L 3.5 4.1 5.0  Chloride 101 - 111 mmol/L 101 99(L) 98(L)  CO2 22 - 32 mmol/L 28 27 28   Calcium 8.9 - 10.3 mg/dL 9.2 9.3 9.4  Total Protein 6.5 - 8.1 g/dL - - -  Total Bilirubin 0.3 - 1.2 mg/dL - - -  Alkaline Phos 38 - 126 U/L - - -  AST 15 - 41 U/L - - -  ALT 14 - 54 U/L - - -   Results for NAKYIAH, KUCK (MRN [id number])   Ref. Range 12/14/2015 10:57 12/14/2015 10:57  Iron Latest Ref Range: 28 - 170 ug/dL 75   UIBC Latest Units: ug/dL 195   TIBC Latest Ref Range: 250 - 450 ug/dL 270   Saturation Ratios Latest Ref Range: 10.4 - 31.8 % 28   Ferritin Latest Ref Range: 11 - 307 ng/mL 208   Folate, Hemolysate Latest Ref Range: Not Estab. ng/mL  608.7  Folate, RBC Latest Ref Range: >498 ng/mL  1,920  Vitamin B12 Latest Ref Range: 180 - 914 pg/mL 577    Results for KEILY, LEPP (MRN [id number])  Ref. Range 12/14/2015 10:57  LDH Latest Ref Range: 98 - 192 U/L 167   Results for ELAINNA, ESHLEMAN (MRN [id number])   Ref. Range 12/14/2015 10:57  Kappa free light chain Latest Ref Range: 3.3 - 19.4 mg/L 205.1 (H)  Lamda free light chains Latest Ref Range: 5.7 - 26.3 mg/L 9.7  Kappa, lamda light chain ratio Latest Ref Range: 0.26 - 1.65  21.14 (H)      PATHOLOGY:           RADIOGRAPHIC STUDIES: I have personally reviewed the radiological images as listed and agreed with the findings in the report. Ct Biopsy  Result Date: 01/20/2016 INDICATION: Concern for multiple myeloma. Please perform CT-guided biopsy for tissue diagnostic purposes.  EXAM: CT-GUIDED BONE MARROW BIOPSY AND ASPIRATION MEDICATIONS: None ANESTHESIA/SEDATION: Fentanyl 50 mcg IV; Versed 2 mg IV Sedation Time: 11 minutes; The patient was continuously monitored during the procedure by the interventional  radiology nurse under my direct supervision. COMPLICATIONS: None immediate. PROCEDURE: Informed consent was obtained from the patient following an explanation of the procedure, risks, benefits and alternatives. The patient understands, agrees and consents for the procedure. All questions were addressed. A time out was performed prior to the initiation of the procedure. The patient was positioned prone and non-contrast localization CT was performed of the pelvis to demonstrate the iliac marrow spaces. The operative site was prepped and draped in the usual sterile fashion. Under sterile conditions and local anesthesia, a 22 gauge spinal needle was utilized for procedural planning. Next, an 11 gauge coaxial bone biopsy needle was advanced into the left iliac marrow space. Needle position was confirmed with CT imaging. Initially, bone marrow aspiration was performed. Next, a bone marrow biopsy was obtained with the 11 gauge outer bone marrow device. The 11 gauge coaxial bone biopsy needle was re-advanced into a slightly different location within the left iliac marrow space, positioning was confirmed and an additional bone marrow biopsy was obtained. Samples were prepared with the cytotechnologist and deemed adequate. The needle was removed intact. Hemostasis was obtained with compression and a dressing was placed. The patient tolerated the procedure well without immediate post procedural complication. IMPRESSION: Successful CT guided left iliac bone marrow aspiration and core biopsy. Electronically Signed   By: Sandi Mariscal M.D.   On: 01/20/2016 11:52    ASSESSMENT & PLAN:  IgA kappa myeloma with BMBX with 24% plasma cells, cytogenetics 13q- Normocytic anemia HX iron deficiency HX IV iron replacement Polymyalgia rheumatica   82 year old female with multiple medical comorbidities currently residing in assisted living at [facility] referred for evaluation of anemia.  #1 Normocytic anemia with high normal MCV and  borderline elevated RDW. Normal WBC count and platelets. Patient apparently has had previous iron deficiency anemia without clear etiology and received IV iron about a year or so ago as per her report. Currently however, no frank iron deficiency noted.   I reviewed her labs obtained at her initial visit. She has an abnormal kappa/lambda light chain ratio, small M spike and suppressed IgG. Haptoglobin is low normal, LDH and biliirubin are WNL. BMBX was performed. Results are reviewed with the patient and her son in detail. Results are c/w Myeloma.   We discussed recommendations for therapy and I have recommended Revlimid/dex. She will need a myeloma survey.  All patients should be on zometa or pamidronate. Once she is doing well on revlimid we will add accordingly. Will arrange for teaching. She will need to be on adult ASA daily. Can begin with Revlimid first and add in dex once she is doing well. Counts will be followed closely as well once therapy is instituted.  The patient and her son were provided with reading information today. Teaching regarding myeloma was performed. All questions were answered.   Orders Placed This Encounter  Procedures  . DG Bone Survey Met    Standing Status:   Future    Number of Occurrences:   1    Standing Expiration Date:   04/11/2017    Order Specific Question:   Reason for Exam (SYMPTOM  OR DIAGNOSIS REQUIRED)    Answer:   myeloma    Order Specific Question:   Preferred imaging location?    Answer:   [facility]     All of the patients questions were answered with apparent satisfaction. The patient knows to call the clinic with any problems, questions or concerns.  This document serves as a record of services personally performed by Ancil Linsey, MD. It was created on her behalf by Arlyce Harman, a trained medical scribe. The creation of this record is based on the scribe's personal observations and the provider's statements to them. This  document has been checked and approved by the attending provider.  I have reviewed the above documentation for accuracy and completeness and I agree with the above.  Kelby Fam. Kalijah Zeiss, MD 02/08/2016 4:28 PM

## 2016-02-09 ENCOUNTER — Encounter (HOSPITAL_COMMUNITY): Payer: Self-pay | Admitting: Hematology & Oncology

## 2016-02-09 ENCOUNTER — Encounter (HOSPITAL_COMMUNITY): Payer: Medicare Other | Attending: Hematology | Admitting: Hematology & Oncology

## 2016-02-09 ENCOUNTER — Ambulatory Visit (HOSPITAL_COMMUNITY)
Admission: RE | Admit: 2016-02-09 | Discharge: 2016-02-09 | Disposition: A | Discharge status: Home / Self Care | Payer: Medicare Other | Source: Ambulatory Visit | Attending: Hematology & Oncology | Admitting: Hematology & Oncology

## 2016-02-09 VITALS — BP 150/51 | HR 70 | Temp 98.7°F | Resp 18 | Wt 135.5 lb

## 2016-02-09 DIAGNOSIS — E538 Deficiency of other specified B group vitamins: Secondary | ICD-10-CM | POA: Insufficient documentation

## 2016-02-09 DIAGNOSIS — M353 Polymyalgia rheumatica: Secondary | ICD-10-CM | POA: Diagnosis not present

## 2016-02-09 DIAGNOSIS — D630 Anemia in neoplastic disease: Secondary | ICD-10-CM

## 2016-02-09 DIAGNOSIS — R54 Age-related physical debility: Secondary | ICD-10-CM

## 2016-02-09 DIAGNOSIS — C9000 Multiple myeloma not having achieved remission: Secondary | ICD-10-CM

## 2016-02-09 DIAGNOSIS — D649 Anemia, unspecified: Secondary | ICD-10-CM | POA: Insufficient documentation

## 2016-02-09 DIAGNOSIS — Z8639 Personal history of other endocrine, nutritional and metabolic disease: Secondary | ICD-10-CM

## 2016-02-09 MED ORDER — HYDROCODONE-ACETAMINOPHEN 2.5-325 MG PO TABS: ORAL_TABLET | ORAL | 0 refills | Status: DC

## 2016-02-09 NOTE — Patient Instructions (Addendum)
[name] at [facility] Discharge Instructions  RECOMMENDATIONS MADE BY THE CONSULTANT AND ANY TEST RESULTS WILL BE SENT TO YOUR REFERRING PHYSICIAN.  You saw Dr. Whitney Muse today. You will have bone survey today. Follow up in 2 weeks. You will be set up for Revlimid teaching.  Thank you for choosing [facility] to provide your oncology and hematology care.  To afford each patient quality time with our provider, please arrive at least 15 minutes before your scheduled appointment time.   Beginning January 23rd 2017 lab work for the [facility] will be done in the  Main lab at [facility] on 1st floor. If you have a lab appointment with the [facility] please come in thru the  Main Entrance and check in at the main information desk  You need to re-schedule your appointment should you arrive 10 or more minutes late.  We strive to give you quality time with our providers, and arriving late affects you and other patients whose appointments are after yours.  Also, if you no show three or more times for appointments you may be dismissed from the clinic at the providers discretion.     Again, thank you for choosing [facility].  Our hope is that these requests will decrease the amount of time that you wait before being seen by our physicians.       _____________________________________________________________  Should you have questions after your visit to [facility], please contact our office at [phone] between the hours of 8:30 a.m. and 4:30 p.m.  Voicemails left after 4:30 p.m. will not be returned until the following business day.  For prescription refill requests, have your pharmacy contact our office.         Resources For Cancer Patients and their Caregivers ? American Cancer Society: Can assist with transportation, wigs, general needs, runs Look Good Feel Better.         [phone] ? Cancer Care: Provides financial assistance, online support groups, medication/co-pay assistance.  1-800-813-HOPE 415 351 8819) ? [place] Assists [place] cancer patients and their families through emotional , educational and financial support.  939-232-7315 ? [place] DSS Where to apply for food stamps, Medicaid and utility assistance. 787-657-6564 ? [employer]: Transportation to medical appointments. 6697334545 ? Social Security Administration: May apply for disability if have a Stage IV cancer. 256-596-8816 930-277-1570 ? [employer], Disability and Transit Services: Assists with nutrition, care and transit needs. [place] Support Programs: @10RELATIVEDAYS @ > Cancer Support Group  2nd Tuesday of the month 1pm-2pm, Journey Room  > Creative Journey  3rd Tuesday of the month 1130am-1pm, Journey Room  > Look Good Feel Better  1st Wednesday of the month 10am-12 noon, Journey Room (Call American Cancer Society to register [phone])   Lenalidomide Oral Capsules What is this medicine? LENALIDOMIDE (len a LID oh mide) is a chemotherapy drug that targets specific proteins within cancer cells and stops the cancer cell from growing. It is used to treat multiple myeloma, mantle cell lymphoma, and some myelodysplastic syndromes that cause severe anemia requiring blood transfusions. This medicine may be used for other purposes; ask your health care provider or pharmacist if you have questions. What should I tell my health care provider before I take this medicine? They need to know if you have any of these conditions: -blood clots in the legs or the lungs -high blood pressure -high cholesterol -  infection -irregular monthly periods or menstrual cycles -kidney disease -liver disease -smoke tobacco -thyroid disease -an unusual or allergic reaction to lenalidomide, other medicines, foods, dyes, or  preservatives -pregnant or trying to get pregnant -breast-feeding How should I use this medicine? Take this medicine by mouth with a glass of water. Follow the directions on the prescription label. Do not cut, crush, or chew this medicine. Take your medicine at regular intervals. Do not take it more often than directed. Do not stop taking except on your doctor's advice. A MedGuide will be given with each prescription and refill. Read this guide carefully each time. The MedGuide may change frequently. Talk to your pediatrician regarding the use of this medicine in children. Special care may be needed. Overdosage: If you think you have taken too much of this medicine contact a poison control center or emergency room at once. NOTE: This medicine is only for you. Do not share this medicine with others. What if I miss a dose? If you miss a dose, take it as soon as you can. If your next dose is to be taken in less than 12 hours, then do not take the missed dose. Take the next dose at your regular time. Do not take double or extra doses. What may interact with this medicine? This medicine may interact with the following medications: -digoxin -medicines that increase the risk of thrombosis like estrogens or erythropoietic agents (e.g., epoetin alfa and darbepoetin alfa) -warfarin This list may not describe all possible interactions. Give your health care provider a list of all the medicines, herbs, non-prescription drugs, or dietary supplements you use. Also tell them if you smoke, drink alcohol, or use illegal drugs. Some items may interact with your medicine. What should I watch for while using this medicine? Visit your doctor for regular check ups. Tell your doctor or healthcare professional if your symptoms do not start to get better or if they get worse. You will need to have important blood work done while you are taking this medicine. This medicine is available only through a special program.  Doctors, pharmacies, and patients must meet all of the conditions of the program. Your health care provider will help you get signed up with the program if you need this medicine. Through the program you will only receive up to a 28 day supply of the medicine at one time. You will need a new prescription for each refill. This medicine can cause birth defects. Do not get pregnant while taking this drug. Females with child-bearing potential will need to have 2 negative pregnancy tests before starting this medicine. Pregnancy testing must be done every 2 to 4 weeks as directed while taking this medicine. Use 2 reliable forms of birth control together while you are taking this medicine and for 1 month after you stop taking this medicine. If you think that you might be pregnant talk to your doctor right away. Men must use a latex condom during sexual contact with a woman while taking this medicine and for 28 days after you stop taking this medicine. A latex condom is needed even if you have had a vasectomy. Contact your doctor right away if your partner becomes pregnant. Do not donate sperm while taking this medicine and for 28 days after you stop taking this medicine. Do not give blood while taking the medicine and for 1 month after completion of treatment to avoid exposing pregnant women to the medicine through the donated blood. Talk to your doctor about  your risk of cancer. You may be more at risk for certain types of cancers if you take this medicine. What side effects may I notice from receiving this medicine? Side effects that you should report to your doctor or health care professional as soon as possible: -allergic reactions like skin rash, itching or hives, swelling of the face, lips, or tongue -breathing problems -chest pain or tightness -fast, irregular heartbeat -low blood counts - this medicine may decrease the number of white blood cells, red blood cells and platelets. You may be at increased  risk for infections and bleeding. -seizures -signs and symptoms of bleeding such as bloody or black, tarry stools; red or dark-brown urine; spitting up blood or brown material that looks like coffee grounds; red spots on the skin; unusual bruising or bleeding from the eye, gums, or nose -signs and symptoms of a blood clot such as breathing problems; changes in vision; chest pain; severe, sudden headache; pain, swelling, warmth in the leg; trouble speaking; sudden numbness or weakness of the face, arm or leg -signs and symptoms of liver injury like dark yellow or brown urine; general ill feeling or flu-like symptoms; light-colored stools; loss of appetite; nausea; right upper belly pain; unusually weak or tired; yellowing of the eyes or skin -signs and symptoms of a stroke like changes in vision; confusion; trouble speaking or understanding; severe headaches; sudden numbness or weakness of the face, arm or leg; trouble walking; dizziness; loss of balance or coordination -sweating -vomiting Side effects that usually do not require medical attention (report to your doctor or health care professional if they continue or are bothersome): -constipation -cough -diarrhea -tiredness This list may not describe all possible side effects. Call your doctor for medical advice about side effects. You may report side effects to FDA at 1-800-FDA-1088. Where should I keep my medicine? Keep out of the reach of children. Store at room temperature between 15 and 30 degrees C (59 and 86 degrees F). Throw away any unused medicine after the expiration date. NOTE: This sheet is a summary. It may not cover all possible information. If you have questions about this medicine, talk to your doctor, pharmacist, or health care provider.    2016, Elsevier/Gold Standard. (2013-07-14 18:30:01)

## 2016-02-10 ENCOUNTER — Telehealth (HOSPITAL_COMMUNITY): Payer: Self-pay | Admitting: Emergency Medicine

## 2016-02-10 MED ORDER — LENALIDOMIDE 10 MG PO CAPS: 10.0000 mg | ORAL_CAPSULE | Freq: Every day | ORAL | 1 refills | Status: DC

## 2016-02-13 MED ORDER — DEXAMETHASONE 4 MG PO TABS: 20.0000 mg | ORAL_TABLET | ORAL | 2 refills | Status: DC

## 2016-02-13 NOTE — Patient Instructions (Addendum)
[place]  You have been diagnosed with multiple myeloma.  You will start taking the chemotherapy drug called Revlimid along with dexamethasone.  Together these will help fight your multiple myeloma.  You will take 1 capsule (10 mg) once a day.  With or without food.  Make sure you take this medication at the same time every day.  The dexamethasone you will take 20 mg (5 tablets) weekly. Make sure you take this with food.  You will see the doctor regularly throughout treatment.  We monitor your lab work prior to every treatment.  The doctor monitors your response to treatment by the way you are feeling, your blood work, and scans periodically.   CHEMOTHERAPY INSTRUCTIONS  REVLIMID (lenalidomide) is a prescription medicine, used to treat people with multiple myeloma (MM) in combination with the medicine dexamethasone, or as maintenance treatment after autologous hematopoietic stem cell transplantation (a type of stem cell transplant that uses your own stem cells).   How should I take REVLIMID? Take REVLIMID exactly as prescribed and follow all the instructions of the REVLIMID REMS program Swallow REVLIMID capsules whole with water 1 time a day. Do not open, break, or chew your capsules.  REVLIMID may be taken with or without food.  Take REVLIMID at about the same time each day.  Do not open the REVLIMID capsules or handle them any more than needed. If you touch a broken REVLIMID capsule or the medicine in the capsule, wash the area of your body right away with soap and water.  If you miss a dose of REVLIMID and it has been less than 12 hours since your regular time, take it as soon as you remember. If it has been more than 12 hours, just skip your missed dose. Do not take 2 doses at the same time.  If you take too much REVLIMID, call your healthcare provider right away.  Do not share REVLIMID with other people. It may cause birth defects and other serious problems.   POTENTIAL  SIDE EFFECTS OF TREATMENT:  Revlimid - chemo med - side effects: neutropenia (low white blood cells), thrombocytopenia (low platelets), deep vein thrombosis (blood clot in extremity), pulmonary embolism (blood clot in lung), itching, rash, dry skin, diarrhea, constipation, nausea, vomiting, fatigue, dizziness, headache, muscle cramps/aches, inflammation of the nose and throat, fever, upper respiratory tract infections.  REVLIMID may cause serious side effects, including: Possible birth defects (deformed babies) or death of an unborn baby. Females who are pregnant or who plan to become pregnant must not take REVLIMID.  REVLIMID is similar to the medicine thalidomide (THALOMID), which is known to cause severe life-threatening birth defects. REVLIMID has not been tested in pregnant females. REVLIMID has harmed unborn animals in animal testing.  Females must not get pregnant:  For at least 4 weeks before starting REVLIMID  While taking REVLIMID  During any breaks (interruptions) in your treatment with REVLIMID  For at least 4 weeks after stopping REVLIMID Females who can become pregnant:  Must have pregnancy tests weekly for 4 weeks, then every 4 weeks if your menstrual cycle is regular, or every 2 weeks if your menstrual cycle is irregular. If you miss your period or have unusual bleeding, you will need to have a pregnancy test and receive counseling.  Must agree to use 2 different forms of effective birth control at the same time, for at least 4 weeks before, while taking, during any breaks (interruptions) in your treatment, and for at least 4  weeks after stopping REVLIMID. Talk with your healthcare provider to find out about options for effective forms of birth control that you may use to prevent pregnancy.  If you had unprotected sex or if you think your birth control has failed, stop taking REVLIMID immediately and call your healthcare provider right away. If you become pregnant while taking  REVLIMID, stop taking it right away and call your healthcare provider. If your healthcare provider is not available, you can call [place] at [phone]. Healthcare providers and patients should report all cases of pregnancy to FDA MedWatch at 1-800-FDA-1088, and [employer] at (609) 109-3207. There is a pregnancy exposure registry that monitors the outcomes of females who take REVLIMID during pregnancy, or if their female partner takes REVLIMID and they are exposed during pregnancy. You can enroll in this registry by [place] at the phone number listed above.  REVLIMID can pass into human semen. Males, including those who have had a vasectomy, must always use a latex or synthetic condom during any sexual contact with a pregnant female or a female that can become pregnant while taking REVLIMID, during any breaks (interruptions) in your treatment with REVLIMID, and for up to 4 weeks after stopping REVLIMID.  If a female becomes pregnant with your sperm, you should call your HCP right away. The baby may be exposed to REVLIMID and may be born with birth defects.  Do not have unprotected sexual contact with a female who is or could become pregnant. Tell your healthcare provider if you do have unprotected sexual contact with a female who is or could become pregnant.  Do not donate sperm while taking REVLIMID, during any breaks (interruptions) in your treatment, and for 4 weeks after stopping REVLIMID. Low white blood cells (neutropenia) and low platelets (thrombocytopenia). REVLIMID causes low white blood cells and low platelets in most people. You may need a blood transfusion or certain medicines if your blood counts drop too low. Your healthcare provider should check your blood counts often especially during the first several months of treatment with REVLIMID, and then at least monthly. Tell your healthcare provider if you develop any bleeding or bruising, during  treatment with REVLIMID.  Blood clots. Blood clots in the arteries, veins, and lungs happen more often in people who take REVLIMID. This risk is even higher for people with multiple myeloma who take the medicine dexamethasone with REVLIMID. Heart attacks and strokes also happen more often in people who take REVLIMID with dexamethasone. To reduce this increased risk, most people who take REVLIMID will also take a blood thinner medicine. Before taking REVLIMID, tell your healthcare provider:  If you have had a blood clot in the past  If you have high blood pressure, smoke, or if you have been told you have a high level of fat in your blood (hyperlipidemia)  About all the medicines you take. Certain other medicines can also increase your risk for blood clots Call your healthcare provider or get medical help right away if you get any of the following during treatment with REVLIMID: Signs or symptoms of a blood clot in the lung, arm, or leg may include: shortness of breath, chest pain, or arm or leg swelling  Signs or symptoms of a heart attack may include: chest pain that may spread to the arms, neck, jaw, back, or stomach area (abdomen), feeling sweaty, shortness of breath, feeling sick or vomiting  Signs or symptoms of stroke may include: sudden numbness or weakness, especially on  one side of the body, severe headache or confusion, or problems with vision, speech, or balance    EDUCATIONAL MATERIALS GIVEN AND REVIEWED: Reference sheets on Revlimid.    SELF CARE ACTIVITIES WHILE ON CHEMOTHERAPY: Hydration Increase your fluid intake 48 hours prior to treatment and drink at least 8 to 12 cups (64 ounces) of water/decaff beverages per day after treatment. You can still have your cup of coffee or soda but these beverages do not count as part of your 8 to 12 cups that you need to drink daily. No alcohol intake.  Medications Continue taking your normal prescription medication as prescribed.  If you  start any new herbal or new supplements please let us know first to make sure it is safe.  Mouth Care Have teeth cleaned professionally before starting treatment. Keep dentures and partial plates clean. Use soft toothbrush and do not use mouthwashes that contain alcohol. Biotene is a good mouthwash that is available at most pharmacies or may be ordered by calling [phone]. Use warm salt water gargles (1 teaspoon salt per 1 quart warm water) before and after meals and at bedtime. Or you may rinse with 2 tablespoons of three-percent hydrogen peroxide mixed in eight ounces of water. If you are still having problems with your mouth or sores in your mouth please call the clinic. If you need dental work, please let Dr. Whitney Muse know before you go for your appointment so that we can coordinate the best possible time for you in regards to your chemo regimen. You need to also let your dentist know that you are actively taking chemo. We may need to do labs prior to your dental appointment.   Skin Care Always use sunscreen that has not expired and with SPF (Sun Protection Factor) of 50 or higher. Wear hats to protect your head from the sun. Remember to use sunscreen on your hands, ears, face, & feet.  Use good moisturizing lotions such as udder cream, eucerin, or even Vaseline. Some chemotherapies can cause dry skin, color changes in your skin and nails.    . Avoid long, hot showers or baths. . Use gentle, fragrance-free soaps and laundry detergent. . Use moisturizers, preferably creams or ointments rather than lotions because the thicker consistency is better at preventing skin dehydration. Apply the cream or ointment within 15 minutes of showering. Reapply moisturizer at night, and moisturize your hands every time after you wash them.  Hair Loss (if your doctor says your hair will fall out)  . If your doctor says that your hair is likely to fall out, decide before you begin chemo whether you want to wear  a wig. You may want to shop before treatment to match your hair color. . Hats, turbans, and scarves can also camouflage hair loss, although some people prefer to leave their heads uncovered. If you go bare-headed outdoors, be sure to use sunscreen on your scalp. . Cut your hair short. It eases the inconvenience of shedding lots of hair, but it also can reduce the emotional impact of watching your hair fall out. . Don't perm or color your hair during chemotherapy. Those chemical treatments are already damaging to hair and can enhance hair loss. Once your chemo treatments are done and your hair has grown back, it's OK to resume dyeing or perming hair. With chemotherapy, hair loss is almost always temporary. But when it grows back, it may be a different color or texture. In older adults who still had hair color before  chemotherapy, the new growth may be completely gray.  Often, new hair is very fine and soft.  Infection Prevention Please wash your hands for at least 30 seconds using warm soapy water. Handwashing is the #1 way to prevent the spread of germs. Stay away from sick people or people who are getting over a cold. If you develop respiratory systems such as green/yellow mucus production or productive cough or persistent cough let us know and we will see if you need an antibiotic. It is a good idea to keep a pair of gloves on when going into grocery stores/[employer] to decrease your risk of coming into contact with germs on the carts, etc. Carry alcohol hand gel with you at all times and use it frequently if out in public. If your temperature reaches 100.5 or higher please call the clinic and let us know.  If it is after hours or on the weekend please go to the ER if your temperature is over 100.5.  Please have your own personal thermometer at home to use.    Sex and bodily fluids If you are going to have sex, a condom must be used to protect the person that isn't taking chemotherapy. Chemo can decrease  your libido (sex drive). For a few days after chemotherapy, chemotherapy can be excreted through your bodily fluids.  When using the toilet please close the lid and flush the toilet twice.  Do this for a few day after you have had chemotherapy.     Effects of chemotherapy on your sex life Some changes are simple and won't last long. They won't affect your sex life permanently. Sometimes you may feel: . too tired . not strong enough to be very active . sick or sore  . not in the mood . anxious or low Your anxiety might not seem related to sex. For example, you may be worried about the cancer and how your treatment is going. Or you may be worried about money, or about how you family are coping with your illness. These things can cause stress, which can affect your interest in sex. It's important to talk to your partner about how you feel. Remember - the changes to your sex life don't usually last long. There's usually no medical reason to stop having sex during chemo. The drugs won't have any long term physical effects on your performance or enjoyment of sex. Cancer can't be passed on to your partner during sex  Contraception It's important to use reliable contraception during treatment. Avoid getting pregnant while you or your partner are having chemotherapy. This is because the drugs may harm the baby. Sometimes chemotherapy drugs can leave a man or woman infertile.  This means you would not be able to have children in the future. You might want to talk to someone about permanent infertility. It can be very difficult to learn that you may no longer be able to have children. Some people find counselling helpful. There might be ways to preserve your fertility, although this is easier for men than for women. You may want to speak to a fertility expert. You can talk about sperm banking or harvesting your eggs. You can also ask about other fertility options, such as donor eggs. If you have or have had  breast cancer, your doctor might advise you not to take the contraceptive pill. This is because the hormones in it might affect the cancer.  It is not known for sure whether or not chemotherapy drugs can  be passed on through semen or secretions from the vagina. Because of this some doctors advise people to use a barrier method (such as condoms, femidoms or dental dams) if you have sex during treatment. This applies to vaginal, anal or oral sex. Generally, doctors advise a barrier method only for the time you are actually having the treatment and for about a week after your treatment. Advice like this can be worrying, but this does not mean that you have to avoid being intimate with your partner. You can still have close contact with your partner and continue to enjoy sex.  Animals If you have cats or birds we just ask that you not change the litter or change the cage.  Please have someone else do this for you while you are on chemotherapy.   Food Safety During and After Cancer Treatment Food safety is important for people both during and after cancer treatment. Cancer and cancer treatments, such as chemotherapy, radiation therapy, and stem cell/bone marrow transplantation, often weaken the immune system. This makes it harder for your body to protect itself from foodborne illness, also called food poisoning. Foodborne illness is caused by eating food that contains harmful bacteria, parasites, or viruses.  Foods to avoid Some foods have a higher risk of becoming tainted with bacteria. These include: Marland Kitchen Unwashed fresh fruit and vegetables, especially leafy vegetables that can hide dirt and other contaminants . Raw sprouts, such as alfalfa sprouts . Raw or undercooked beef, especially ground beef, or other raw or undercooked meat and poultry . Cold hot dogs or deli lunch meat (cold cuts), including dry-cured, uncooked salami. Always cook or reheat these foods until they are steaming hot. . Fatty, fried,  or spicy foods immediately before or after treatment.  These can sit heavy on your stomach and make you feel nauseous. . Raw or undercooked shellfish, such as oysters. . Sushi and sashimi, which often contain raw fish.  . Unpasteurized beverages, such as unpasteurized fruit juices, raw milk, raw yogurt, or cider . Soft cheeses made from unpasteurized milk, such as blue-veined (a type of blue cheese), Brie, Camembert, feta, goat cheese, and queso fresco or blanco . Undercooked eggs, such as soft boiled, over easy, and poached; raw, unpasteurized eggs; or foods made with raw egg, such as homemade raw cookie dough and homemade mayonnaise . Deli-prepared salads with egg, ham, chicken, or seafood Simple steps for food safety Shop smart. . Do not buy food stored or displayed in an unclean area. . Do not buy bruised or damaged fruits or vegetables. . Do not buy cans that have cracks, dents, or bulges. . Pick up foods that can spoil at the end of your shopping trip and store them in a cooler on the way home. Prepare and clean up foods carefully. . Rinse all fresh fruits and vegetables under running water, and dry them with a clean towel or paper towel. . Clean the top of cans before opening them. . After preparing food, wash your hands for 20 seconds with hot water and soap. Pay special attention to areas between fingers and under nails. . Clean your utensils and dishes with hot water and soap. Marland Kitchen Disinfect your kitchen and cutting boards using 1 teaspoon of liquid, unscented bleach mixed into 1 quart of water.   Prevent cross-contamination. Marland Kitchen Keep raw meat, poultry, and fish or their juices away from other food. Bacteria can spread through contact with the food or its liquid, causing cross-contamination. . Do not rinse raw  meat or poultry because it can spread bacteria to nearby surfaces. Wendee Copp all items you used for preparing raw foods, including utensils, cutting board, and plates, before using them  for other foods or cooked meat. . Set aside a specific cutting board for preparing uncooked meat, fish, and chicken. Never use it for uncooked fruits, vegetables, or other foods. Dispose of old food. . Eat canned and packaged food before its expiration date (the "use by" or "best before" date). . Consume refrigerated leftovers within 3 to 4 days. After that time, throw out the food. Even if the food does not smell or look spoiled, it still may be unsafe. Some bacteria, such as Listeria, can grow even on foods stored in the refrigerator if they are kept for too long. Take precautions when eating out. . At restaurants, avoid buffets and salad bars where food sits out for a long time and comes in contact with many people. Food can become contaminated when someone with a virus, often a norovirus, or another "bug" handles it. . Put any leftover food in a "to-go" container yourself, rather than having the server do it. And, refrigerate leftovers as soon as you get home. . Choose restaurants that are clean and that are willing to prepare your food as you order it cooked. Cook food to the right temperature. Use a food thermometer to check for a safe internal temperature of all poultry and meat. For instance, a hamburger should be cooked to at least medium (160?F or 71?C). Get a full list of recommended internal cooking temperatures on the website of the U[place]. Food and Drug Administration (FDA).  Chill food promptly. Refrigerate or freeze perishable food within 2 hours of cooking or buying it (sooner in warm weather.) Proper cooking destroys bacteria, but they can still grow on cooked food that is left out too long. Food stored in the refrigerator should be kept at below 40?F (4?C). And, food stored in the freezer should be kept below 32?F (0?C). Thaw food properly. Thaw frozen food in the refrigerator rather than at room temperature. You can also thaw food in frequently changed cold water or in the microwave, but  cook it as soon as it thaws.   MEDICATIONS: Dexamethasone 4 mg: 20 mg (5 tablets) weekly.  Together with Revlimid this will help fight your multiple myeloma.  Aspirin 325 mg: take daily, this will help prevent blood clot.  Revlimid can increase your risk of blood clots.  Over-the-Counter Meds:  Miralax 1 capful in 8 oz of fluid daily. May increase to two times a day if needed. This is a stool softener. If this doesn't work proceed you can add:  Senokot S-start with 1 tablet two times a day and increase to 4 tablets two times a day if needed. (total of 8 tablets in a 24 hour period). This is a stimulant laxative.   Call us if this does not help your bowels move.   Imodium 58m capsule. Take 2 capsules after the 1st loose stool and then 1 capsule every 2 hours until you go a total of 12 hours without having a loose stool. Call the [facility] loose stools continue. If diarrhea occurs @ bedtime, take 2 capsules @ bedtime. Then take 2 capsules every 4 hours until morning. Call [facility]      Diarrhea Sheet  If you are having loose stools/diarrhea, please purchase Imodium and begin taking as outlined:  At the first sign of poorly formed or loose  stools you should begin taking Imodium(loperamide) 2 mg capsules.  Take two caplets (29m) followed by one caplet (267m every 2 hours until you have had no diarrhea for 12 hours.  During the night take two caplets (70m35mat bedtime and continue every 4 hours during the night until the morning.  Stop taking Imodium only after there is no sign of diarrhea for 12 hours.    Always call the [facility] you are having loose stools/diarrhea that you can't get under control.  Loose stools/disrrhea leads to dehydration (loss of water) in your body.  We have other options of trying to get the loose stools/diarrhea to stopped but you must let us [place]!     Constipation Sheet *Miralax in 8 oz of fluid daily.  May increase to two times a day if  needed.  This is a stool softener.  If this not enough to keep your bowel regular:  You can add:  *Senokot S, start with one tablet twice a day and can increase to 4 tablets twice a day if needed.  This is a stimulant laxative.   Sometimes when you take pain medication you need BOTH a medicine to keep your stool soft and a medicine to help your bowel push it out!  Please call if the above does not work for you.   Do not go more than 2 days without a bowel movement.  It is very important that you do not become constipated.  It will make you feel sick to your stomach (nausea) and can cause abdominal pain and vomiting.      SYMPTOMS TO REPORT AS SOON AS POSSIBLE AFTER TREATMENT:  FEVER GREATER THAN 100.5 F  CHILLS WITH OR WITHOUT FEVER  NAUSEA AND VOMITING THAT IS NOT CONTROLLED WITH YOUR NAUSEA MEDICATION  UNUSUAL SHORTNESS OF BREATH  UNUSUAL BRUISING OR BLEEDING  TENDERNESS IN MOUTH AND THROAT WITH OR WITHOUT PRESENCE OF ULCERS  URINARY PROBLEMS  BOWEL PROBLEMS  UNUSUAL RASH    Wear comfortable clothing and clothing appropriate for easy access to any Portacath or PICC line. Let us [place] if there is anything that we can do to make your therapy better!     What to do if you need assistance after hours or on the weekends: CALL [phone] on the line, do not hang up.  You will hear multiple messages but at the end you will be connected with a nurse triage line.  They will contact Dr PenWhitney Muse necessary.  Most of the time they will be able to assist you.    Do not call the hospital operator.  Dr PenWhitney Musell not answer phone calls received by them.      I have been informed and understand all of the instructions given to me and have received a copy. I have been instructed to call the clinic [phone] my family physician as soon as possible for continued medical care, if indicated. I do not have any more questions at this time but understand that I may  call the [facility] ([phone] office hours should I have questions or need assistance in obtaining follow-up care.       Lenalidomide Oral Capsules What is this medicine? LENALIDOMIDE (len a LID oh mide) is a chemotherapy drug that targets specific proteins within cancer cells and stops the cancer cell from growing. It is used to treat multiple myeloma, mantle cell lymphoma, and some myelodysplastic syndromes that cause severe anemia requiring  blood transfusions. This medicine may be used for other purposes; ask your health care provider or pharmacist if you have questions. What should I tell my health care provider before I take this medicine? They need to know if you have any of these conditions: -blood clots in the legs or the lungs -high blood pressure -high cholesterol -infection -irregular monthly periods or menstrual cycles -kidney disease -liver disease -smoke tobacco -thyroid disease -an unusual or allergic reaction to lenalidomide, other medicines, foods, dyes, or preservatives -pregnant or trying to get pregnant -breast-feeding How should I use this medicine? Take this medicine by mouth with a glass of water. Follow the directions on the prescription label. Do not cut, crush, or chew this medicine. Take your medicine at regular intervals. Do not take it more often than directed. Do not stop taking except on your doctor's advice. A MedGuide will be given with each prescription and refill. Read this guide carefully each time. The MedGuide may change frequently. Talk to your pediatrician regarding the use of this medicine in children. Special care may be needed. Overdosage: If you think you have taken too much of this medicine contact a poison control center or emergency room at once. NOTE: This medicine is only for you. Do not share this medicine with others. What if I miss a dose? If you miss a dose, take it as soon as you can. If your next dose is to be  taken in less than 12 hours, then do not take the missed dose. Take the next dose at your regular time. Do not take double or extra doses. What may interact with this medicine? This medicine may interact with the following medications: -digoxin -medicines that increase the risk of thrombosis like estrogens or erythropoietic agents (e.g., epoetin alfa and darbepoetin alfa) -warfarin This list may not describe all possible interactions. Give your health care provider a list of all the medicines, herbs, non-prescription drugs, or dietary supplements you use. Also tell them if you smoke, drink alcohol, or use illegal drugs. Some items may interact with your medicine. What should I watch for while using this medicine? Visit your doctor for regular check ups. Tell your doctor or healthcare professional if your symptoms do not start to get better or if they get worse. You will need to have important blood work done while you are taking this medicine. This medicine is available only through a special program. Doctors, pharmacies, and patients must meet all of the conditions of the program. Your health care provider will help you get signed up with the program if you need this medicine. Through the program you will only receive up to a 28 day supply of the medicine at one time. You will need a new prescription for each refill. This medicine can cause birth defects. Do not get pregnant while taking this drug. Females with child-bearing potential will need to have 2 negative pregnancy tests before starting this medicine. Pregnancy testing must be done every 2 to 4 weeks as directed while taking this medicine. Use 2 reliable forms of birth control together while you are taking this medicine and for 1 month after you stop taking this medicine. If you think that you might be pregnant talk to your doctor right away. Men must use a latex condom during sexual contact with a woman while taking this medicine and for 28 days  after you stop taking this medicine. A latex condom is needed even if you have had a vasectomy. Contact your doctor  right away if your partner becomes pregnant. Do not donate sperm while taking this medicine and for 28 days after you stop taking this medicine. Do not give blood while taking the medicine and for 1 month after completion of treatment to avoid exposing pregnant women to the medicine through the donated blood. Talk to your doctor about your risk of cancer. You may be more at risk for certain types of cancers if you take this medicine. What side effects may I notice from receiving this medicine? Side effects that you should report to your doctor or health care professional as soon as possible: -allergic reactions like skin rash, itching or hives, swelling of the face, lips, or tongue -breathing problems -chest pain or tightness -fast, irregular heartbeat -low blood counts - this medicine may decrease the number of white blood cells, red blood cells and platelets. You may be at increased risk for infections and bleeding. -seizures -signs and symptoms of bleeding such as bloody or black, tarry stools; red or dark-brown urine; spitting up blood or brown material that looks like coffee grounds; red spots on the skin; unusual bruising or bleeding from the eye, gums, or nose -signs and symptoms of a blood clot such as breathing problems; changes in vision; chest pain; severe, sudden headache; pain, swelling, warmth in the leg; trouble speaking; sudden numbness or weakness of the face, arm or leg -signs and symptoms of liver injury like dark yellow or brown urine; general ill feeling or flu-like symptoms; light-colored stools; loss of appetite; nausea; right upper belly pain; unusually weak or tired; yellowing of the eyes or skin -signs and symptoms of a stroke like changes in vision; confusion; trouble speaking or understanding; severe headaches; sudden numbness or weakness of the face, arm or  leg; trouble walking; dizziness; loss of balance or coordination -sweating -vomiting Side effects that usually do not require medical attention (report to your doctor or health care professional if they continue or are bothersome): -constipation -cough -diarrhea -tiredness This list may not describe all possible side effects. Call your doctor for medical advice about side effects. You may report side effects to FDA at 1-800-FDA-1088. Where should I keep my medicine? Keep out of the reach of children. Store at room temperature between 15 and 30 degrees C (59 and 86 degrees F). Throw away any unused medicine after the expiration date. NOTE: This sheet is a summary. It may not cover all possible information. If you have questions about this medicine, talk to your doctor, pharmacist, or health care provider.    2016, Elsevier/Gold Standard. (2013-07-14 18:30:01)

## 2016-02-14 ENCOUNTER — Encounter (HOSPITAL_COMMUNITY): Payer: Self-pay | Admitting: Emergency Medicine

## 2016-02-14 NOTE — Telephone Encounter (Signed)
Called to introduce myself to the son, Jeneen Rinks.  Coordinate chemotherapy teaching of revlimid with pt and a nurse with [facility] set up for February 22, 2016 at 1pm.

## 2016-02-14 NOTE — Progress Notes (Signed)
Chemotherapy teaching pulled together on revlimid.

## 2016-02-15 ENCOUNTER — Inpatient Hospital Stay (HOSPITAL_COMMUNITY): Payer: Medicare Other

## 2016-02-15 ENCOUNTER — Telehealth (HOSPITAL_COMMUNITY): Payer: Self-pay | Admitting: Hematology & Oncology

## 2016-02-15 ENCOUNTER — Telehealth (HOSPITAL_COMMUNITY): Payer: Self-pay | Admitting: Oncology

## 2016-02-15 NOTE — Telephone Encounter (Signed)
FAXED REVLIMID SCRIPT TO [facility] . RCVD SCRIPT TODAY 02/15/16 IT PUT INTO OUR "RED BOOK"  BY MISTAKE. MARKED THE SCRIPT AS URGENT

## 2016-02-15 NOTE — Telephone Encounter (Signed)
Submitted PA req online w/Covermymeds KEY# [id number]

## 2016-02-17 ENCOUNTER — Telehealth (HOSPITAL_COMMUNITY): Payer: Self-pay | Admitting: Hematology & Oncology

## 2016-02-17 NOTE — Telephone Encounter (Signed)
Called pt to get financial info and household size for [employer] enrollment form

## 2016-02-20 ENCOUNTER — Telehealth (HOSPITAL_COMMUNITY): Payer: Self-pay | Admitting: Oncology

## 2016-02-20 NOTE — Telephone Encounter (Signed)
Per Debbie Karl there is NO funding for multiple myeloma pts anywhere. So [employer] is overwhelmed with applications for assistance. It may take a couple of days before they determine if pt is eligible for assistance. °

## 2016-02-22 ENCOUNTER — Inpatient Hospital Stay (HOSPITAL_COMMUNITY): Payer: Medicare Other

## 2016-02-22 ENCOUNTER — Telehealth (HOSPITAL_COMMUNITY): Payer: Self-pay | Admitting: Hematology & Oncology

## 2016-02-22 ENCOUNTER — Encounter (HOSPITAL_COMMUNITY): Payer: Medicare Other | Attending: Hematology | Admitting: Hematology & Oncology

## 2016-02-22 ENCOUNTER — Encounter (HOSPITAL_COMMUNITY): Payer: Medicare Other

## 2016-02-22 ENCOUNTER — Encounter (HOSPITAL_COMMUNITY): Payer: Self-pay | Admitting: Hematology & Oncology

## 2016-02-22 VITALS — BP 138/92 | HR 71 | Temp 98.5°F | Resp 16 | Wt 136.9 lb

## 2016-02-22 DIAGNOSIS — M353 Polymyalgia rheumatica: Secondary | ICD-10-CM | POA: Diagnosis not present

## 2016-02-22 DIAGNOSIS — E538 Deficiency of other specified B group vitamins: Secondary | ICD-10-CM | POA: Insufficient documentation

## 2016-02-22 DIAGNOSIS — I10 Essential (primary) hypertension: Secondary | ICD-10-CM

## 2016-02-22 DIAGNOSIS — R54 Age-related physical debility: Secondary | ICD-10-CM

## 2016-02-22 DIAGNOSIS — D649 Anemia, unspecified: Secondary | ICD-10-CM | POA: Insufficient documentation

## 2016-02-22 DIAGNOSIS — C9000 Multiple myeloma not having achieved remission: Secondary | ICD-10-CM

## 2016-02-22 DIAGNOSIS — Z8639 Personal history of other endocrine, nutritional and metabolic disease: Secondary | ICD-10-CM | POA: Diagnosis not present

## 2016-02-22 DIAGNOSIS — M810 Age-related osteoporosis without current pathological fracture: Secondary | ICD-10-CM | POA: Diagnosis not present

## 2016-02-22 NOTE — Telephone Encounter (Signed)
[facility] [phone] F [phone] P  AWARDED FREE REVLIMID UNTIL 04/22/16 AND THEN MUST REAPPLY.

## 2016-02-22 NOTE — Progress Notes (Signed)
Pt education completed on Revlimid.  Pt is suppose to get the drug on Thursday or Friday.  She will then start taking the drug.  She will follow up in about 1.5 weeks.  She knows to call if she starts to have any problems after she starts taking the medication.  Consent signed.

## 2016-02-22 NOTE — Progress Notes (Signed)
HEMATOLOGY/ONCOLOGY PROGRESS NOTE  Date of Service: 02/22/2016  Patient Care Team: Celene Squibb, MD as PCP - General  CHIEF COMPLAINTS/PURPOSE OF CONSULTATION:  Anemia Multiple Myeloma, IgA kappa  HISTORY OF PRESENTING ILLNESS:  [name] is a wonderful 82 y.o. female who has been referred to [place] by Dr .Wende Neighbors, MD  for evaluation and management of multiple myeloma, IgA kappa.   Patient is a very pleasant 82 year old with a history of hypertension, dyslipidemia, hypothyroidism, osteoporosis, polymyalgia rheumatica reports that she had iron deficiency anemia for which she received IV iron more than a year ago.  Peripheral evaluation was relatively unremarkable, small M spike was noted, abnormal kappa/lambda light chain ratio was noted. She ultimately chose to undergo BMBX and has been diagnosed with myeloma.   She received her dexamethasone already and started it, she notes that each time she took it she got a headache. Revlimid has been approved, she has not received it yet. She is aware that she needs to be on an aspirin daily.   She presents today for ongoing discussion and follow-up   MEDICAL HISTORY:  Past Medical History:  Diagnosis Date  . Anemia   . Bradycardia   . Constipation   . Degenerative joint disease   . Diverticulosis   . External hemorrhoids   . GERD (gastroesophageal reflux disease)   . Goiter, unspecified   . Hyperlipidemia   . Hypertension   . Hypothyroidism   . Macular degeneration   . Osteoporosis   . Pacemaker   . Palpitations   . Seasonal allergies   . Thyrotoxicosis without mention of goiter or other cause, without mention of thyrotoxic crisis or storm     SURGICAL HISTORY: Past Surgical History:  Procedure Laterality Date  . COLONOSCOPY  November 2011   External hemorrhoids, diverticulosis, anal papillae  . COLONOSCOPY N/A 04/01/2014   Procedure: COLONOSCOPY;  Surgeon: Rogene Houston, MD;  [facility];  Service:  Endoscopy;  Laterality: N/A;  930  . FEMUR IM NAIL Left 12/29/2012   Procedure: INTRAMEDULLARY (IM) NAIL INTERTROCHANTRIC;  Surgeon: Melina Schools, MD;  Location: [facility];  Service: Orthopedics;  Laterality: Left;  . INSERT / REPLACE / REMOVE PACEMAKER    . PERMANENT PACEMAKER INSERTION N/A 06/07/2011   Procedure: PERMANENT PACEMAKER INSERTION;  Surgeon: Evans Lance, MD;  [facility]: [facility] CATH LAB;  Service: Cardiovascular;  Laterality: N/A;  . THYROIDECTOMY    . TONSILLECTOMY    . vertebroplasty      SOCIAL HISTORY: Social History   Social History  . Marital status: Widowed    Spouse name: N/A  . Number of children: N/A  . Years of education: N/A   Occupational History  . Not on file.   Social History Main Topics  . Smoking status: Never Smoker  . Smokeless tobacco: Never Used  . Alcohol use No  . Drug use: No  . Sexual activity: Not on file   Other Topics Concern  . Not on file   Social History Narrative  . No narrative on file    FAMILY HISTORY: Family History  Problem Relation Age of Onset  . Heart attack Mother     Deceased  . Cancer Father     Deceased, colon cancer age 67  . Cancer Brother     Deceased, throat and lung    ALLERGIES:  is allergic to tramadol and sulfa antibiotics.  MEDICATIONS:  Current Outpatient Prescriptions  Medication Sig Dispense Refill  .  acetaminophen (TYLENOL) 500 MG tablet Take 1,000 mg by mouth every 8 (eight) hours as needed for mild pain.    Marland Kitchen ALPRAZolam (XANAX) 0.25 MG tablet Take 0.25 mg by mouth at bedtime as needed for sleep.    Marland Kitchen amLODipine (NORVASC) 5 MG tablet Take 5 mg by mouth at bedtime.     Marland Kitchen aspirin 81 MG tablet Take 81 mg by mouth daily.     . Calcium Carbonate Antacid (CAL-GEST ANTACID PO) Take by mouth.    . carvedilol (COREG) 6.25 MG tablet Take 1 tablet (6.25 mg total) by mouth 2 (two) times daily with a meal. 60 tablet 0  . cycloSPORINE (RESTASIS) 0.05 % ophthalmic emulsion Place 1 drop into both eyes 2 (two)  times daily. *Self Administration*    . dexamethasone (DECADRON) 4 MG tablet Take 5 tablets (20 mg total) by mouth once a week. 20 tablet 2  . diphenhydrAMINE (BENYLIN) 12.5 MG/5ML syrup Take 5 mLs (12.5 mg total) by mouth 4 (four) times daily as needed for itching or allergies. 120 mL 0  . docusate sodium (COLACE) 100 MG capsule Take 100 mg by mouth at bedtime. For constipation    . Ferrous Sulfate Dried (FERROUS SULFATE CR PO) Take by mouth.    . fluticasone (FLONASE) 50 MCG/ACT nasal spray Place 2 sprays into the nose daily. For allergies    . hydrALAZINE (APRESOLINE) 10 MG tablet Take 10 mg by mouth 2 (two) times daily.    . Hydrocodone-Acetaminophen 2.5-325 MG TABS 1 tablet for mild pain every 4 hours PRN and 2 tablets for moderate to severe pain every 4 hours PRN pain 60 tablet 0  . hydrocortisone (ANUSOL-HC) 2.5 % rectal cream Place 1 application rectally 2 (two) times daily.    . Hypertonic Nasal Wash (SINUS RINSE NA) Place 1 application into both nostrils daily.    . Iron-FA-B Cmp-C-Biot-Probiotic (FUSION PLUS PO) Take 1 capsule by mouth daily.    Marland Kitchen lenalidomide (REVLIMID) 10 MG capsule Take 1 capsule (10 mg total) by mouth daily. 28 capsule 1  . levothyroxine (SYNTHROID, LEVOTHROID) 100 MCG tablet Take 100 mcg by mouth daily before breakfast.    . loperamide (IMODIUM) 2 MG capsule Take 2 mg by mouth every 4 (four) hours as needed for diarrhea or loose stools.    Marland Kitchen loratadine (CLARITIN) 10 MG tablet Take 10 mg by mouth daily.    . Magnesium 250 MG TABS Take 1 tablet by mouth at bedtime.     . magnesium hydroxide (MILK OF MAGNESIA) 400 MG/5ML suspension Take 0.5 mLs by mouth as needed for mild constipation.    . methocarbamol (ROBAXIN) 500 MG tablet Take 1 tablet (500 mg total) by mouth every 6 (six) hours as needed for muscle spasms. 30 tablet 0  . Multiple Vitamins-Minerals (CENTRUM SILVER) tablet Take 1 tablet by mouth 2 (two) times daily.    . multivitamin-lutein (OCUVITE-LUTEIN) CAPS  capsule Take 1 capsule by mouth 2 (two) times daily.    Marland Kitchen omeprazole (PRILOSEC) 20 MG capsule Take 20 mg by mouth daily. Reported on 06/25/2015    . polyethylene glycol (MIRALAX / GLYCOLAX) packet Take 17 g by mouth daily.    . sertraline (ZOLOFT) 50 MG tablet Take 50 mg by mouth daily.    Marland Kitchen tiZANidine (ZANAFLEX) 4 MG tablet Take 4 mg by mouth daily.    . Wheat Dextrin (BENEFIBER DRINK MIX PO) Take 10 mLs by mouth daily with breakfast. Mix 2 teaspoonfuls with water and drink daily.  No current facility-administered medications for this visit.     REVIEW OF SYSTEMS:   Review of Systems  Constitutional: Negative.   HENT:       Headaches secondary to dexamethasone  Eyes: Negative.   Respiratory: Negative.   Cardiovascular: Negative.   Gastrointestinal: Negative.   Genitourinary: Negative.   Musculoskeletal: Negative.   Skin: Negative.   Neurological: Positive for headaches.  Endo/Heme/Allergies: Negative.   Psychiatric/Behavioral: Negative.   All other systems reviewed and are negative. 14 point review of systems was performed and is negative except as detailed under history of present illness and above   PHYSICAL EXAMINATION: ECOG PERFORMANCE STATUS: 3 - Symptomatic, >50% confined to bed Vitals with BMI 02/22/2016  Height   Weight 136 lbs 14 oz  BMI   Systolic 259  Diastolic 92  Pulse 71  Respirations 16   Physical Exam  Constitutional: She is oriented to person, place, and time and well-developed, well-nourished, and in no distress.  HENT:  Head: Normocephalic and atraumatic.  Eyes: EOM are normal. Pupils are equal, round, and reactive to light. No scleral icterus.  Neck: Normal range of motion. Neck supple.  Cardiovascular: Normal rate, regular rhythm and normal heart sounds.   Pulmonary/Chest: Effort normal and breath sounds normal.  Abdominal: Soft. Bowel sounds are normal. She exhibits no distension.  Musculoskeletal: Normal range of motion.  Lymphadenopathy:     She has no cervical adenopathy.  Neurological: She is alert and oriented to person, place, and time. No cranial nerve deficit. Gait normal.  Skin: Skin is warm and dry.  Psychiatric: Mood, memory, affect and judgment normal.  Nursing note and vitals reviewed.   LABORATORY DATA:  I have reviewed the data as listed  . CBC Latest Ref Rng & Units 01/20/2016 01/11/2016 12/14/2015  WBC 4.0 - 10.5 K/uL 4.2 5.5 8.8  Hemoglobin 12.0 - 15.0 g/dL 9.4(L) 10.4(L) 11.1(L)  Hematocrit 36.0 - 46.0 % 28.4(L) 31.9(L) 32.9(L)  Platelets 150 - 400 K/uL 356 305 428(H)    . CMP Latest Ref Rng & Units 01/20/2016 07/01/2015 06/30/2015  Glucose 65 - 99 mg/dL 85 103(H) 109(H)  BUN 6 - 20 mg/dL 21(H) 16 15  Creatinine 0.44 - 1.00 mg/dL 0.96 1.10(H) 1.07(H)  Sodium 135 - 145 mmol/L 137 138 135  Potassium 3.5 - 5.1 mmol/L 3.5 4.1 5.0  Chloride 101 - 111 mmol/L 101 99(L) 98(L)  CO2 22 - 32 mmol/L 28 27 28   Calcium 8.9 - 10.3 mg/dL 9.2 9.3 9.4  Total Protein 6.5 - 8.1 g/dL - - -  Total Bilirubin 0.3 - 1.2 mg/dL - - -  Alkaline Phos 38 - 126 U/L - - -  AST 15 - 41 U/L - - -  ALT 14 - 54 U/L - - -   Results for BRIANN, SARCHET (MRN [id number])   Ref. Range 12/14/2015 10:57 12/14/2015 10:57  Iron Latest Ref Range: 28 - 170 ug/dL 75   UIBC Latest Units: ug/dL 195   TIBC Latest Ref Range: 250 - 450 ug/dL 270   Saturation Ratios Latest Ref Range: 10.4 - 31.8 % 28   Ferritin Latest Ref Range: 11 - 307 ng/mL 208   Folate, Hemolysate Latest Ref Range: Not Estab. ng/mL  608.7  Folate, RBC Latest Ref Range: >498 ng/mL  1,920  Vitamin B12 Latest Ref Range: 180 - 914 pg/mL 577    Results for PAYETON, GERMANI (MRN [id number])  Ref. Range 12/14/2015 10:57  LDH Latest Ref Range: 98 - 192  U/L 167   Results for WAYNE, BRUNKER (MRN [id number])   Ref. Range 12/14/2015 10:57  Kappa free light chain Latest Ref Range: 3.3 - 19.4 mg/L 205.1 (H)  Lamda free light chains Latest Ref Range: 5.7 - 26.3 mg/L 9.7  Kappa, lamda light chain ratio  Latest Ref Range: 0.26 - 1.65  21.14 (H)        RADIOGRAPHIC STUDIES: I have personally reviewed the radiological images as listed and agreed with the findings in the report. Dg Bone Survey Met  Result Date: 02/09/2016 CLINICAL DATA:  Multiple myeloma. EXAM: METASTATIC BONE SURVEY COMPARISON:  None. FINDINGS: Bones are markedly demineralized. No focal lytic lesions in the skull, cervical thoracic spine, or upper extremities. 15 mm lucency L2 vertebral body on the lateral film noted. No evidence for lucent lesions in the bony pelvis or either lower extremity. Post traumatic deformity noted left humeral neck. Patient is status post vertebral augmentation at 3 mid thoracic levels. Patient is status post ORIF left hip fracture. IMPRESSION: Single lucent lesion identified in the L2 vertebral body. This may be related to multiple myeloma. Electronically Signed   By: Misty Stanley M.D.   On: 02/09/2016 19:48    ASSESSMENT & PLAN:  IgA kappa myeloma Normocytic anemia HX iron deficiency HX IV iron replacement Polymyalgia rheumatica   82 year old female with multiple medical comorbidities currently residing in assisted living at [facility] diagnosed with multiple myeloma I advised her to hold her dexamethasone for now given that she has had headaches with it. We can add it in later.  Revlimid is appproved, once she receives it they will call [facility]. She will begin her medication.   She will return for a follow up in 1.5 weeks with labs.   Orders Placed This Encounter  Procedures  . CBC with Differential    Standing Status:   Future    Number of Occurrences:   1    Standing Expiration Date:   02/21/2017  . Comprehensive metabolic panel    Standing Status:   Future    Number of Occurrences:   1    Standing Expiration Date:   02/21/2017    All of the patients questions were answered with apparent satisfaction. The patient knows to call the clinic with any problems, questions or  concerns.  This document serves as a record of services personally performed by Ancil Linsey, MD. It was created on her behalf by Martinique Casey, a trained medical scribe. The creation of this record is based on the scribe's personal observations and the provider's statements to them. This document has been checked and approved by the attending provider.  I have reviewed the above documentation for accuracy and completeness and I agree with the above.  Kelby Fam. Freida Nebel, MD 02/22/2016 8:26 AM

## 2016-02-22 NOTE — Patient Instructions (Addendum)
[place] at [facility] Discharge Instructions  RECOMMENDATIONS MADE BY THE CONSULTANT AND ANY TEST RESULTS WILL BE SENT TO YOUR REFERRING PHYSICIAN.  You saw Dr.Penland today. Follow up in 1 & 1/2 weeks with labs. See Amy at checkout for appointments.  Thank you for choosing [place] at [facility] to provide your oncology and hematology care.  To afford each patient quality time with our provider, please arrive at least 15 minutes before your scheduled appointment time.   Beginning January 23rd 2017 lab work for the [facility] will be done in the  Main lab at [facility] on 1st floor. If you have a lab appointment with the [facility] please come in thru the  Main Entrance and check in at the main information desk  You need to re-schedule your appointment should you arrive 10 or more minutes late.  We strive to give you quality time with our providers, and arriving late affects you and other patients whose appointments are after yours.  Also, if you no show three or more times for appointments you may be dismissed from the clinic at the providers discretion.     Again, thank you for choosing [facility].  Our hope is that these requests will decrease the amount of time that you wait before being seen by our physicians.       _____________________________________________________________  Should you have questions after your visit to [facility], please contact our office at [phone] between the hours of 8:30 a.m. and 4:30 p.m.  Voicemails left after 4:30 p.m. will not be returned until the following business day.  For prescription refill requests, have your pharmacy contact our office.         Resources For Cancer Patients and their Caregivers ? American Cancer Society: Can assist with transportation, wigs, general needs, runs Look Good Feel Better.        281-405-4195 ? Cancer Care: Provides  financial assistance, online support groups, medication/co-pay assistance.  1-800-813-HOPE 714-672-4823) ? [facility] Assists [place] cancer patients and their families through emotional , educational and financial support.  416-610-9791 ? [place] DSS Where to apply for food stamps, Medicaid and utility assistance. (502) 488-9308 ? [facility]: Transportation to medical appointments. 838-521-9341 ? Social Security Administration: May apply for disability if have a Stage IV cancer. 907-394-5205 435-010-1869 ? [employer], Disability and Transit Services: Assists with nutrition, care and transit needs. [place] Support Programs: @10RELATIVEDAYS @ > Cancer Support Group  2nd Tuesday of the month 1pm-2pm, Journey Room  > Creative Journey  3rd Tuesday of the month 1130am-1pm, Journey Room  > Look Good Feel Better  1st Wednesday of the month 10am-12 noon, Journey Room (Call [place] to register [phone])

## 2016-02-29 ENCOUNTER — Telehealth (HOSPITAL_COMMUNITY): Payer: Self-pay | Admitting: Emergency Medicine

## 2016-02-29 NOTE — Telephone Encounter (Signed)
Spoke with nurse Tammy.  They received the Revlimid this morning and it will be her first dose today.  I will adjust her follow up appt accordingly and get amy to call her son.  She verbalized understanding.

## 2016-03-01 ENCOUNTER — Other Ambulatory Visit (HOSPITAL_COMMUNITY): Payer: Self-pay | Admitting: Oncology

## 2016-03-01 DIAGNOSIS — I739 Peripheral vascular disease, unspecified: Secondary | ICD-10-CM | POA: Diagnosis not present

## 2016-03-01 DIAGNOSIS — B351 Tinea unguium: Secondary | ICD-10-CM | POA: Diagnosis not present

## 2016-03-01 DIAGNOSIS — M79671 Pain in right foot: Secondary | ICD-10-CM | POA: Diagnosis not present

## 2016-03-01 DIAGNOSIS — M79672 Pain in left foot: Secondary | ICD-10-CM | POA: Diagnosis not present

## 2016-03-01 DIAGNOSIS — C9000 Multiple myeloma not having achieved remission: Secondary | ICD-10-CM

## 2016-03-01 MED ORDER — LENALIDOMIDE 10 MG PO CAPS: 10.0000 mg | ORAL_CAPSULE | Freq: Every day | ORAL | 0 refills | Status: DC

## 2016-03-04 ENCOUNTER — Encounter (HOSPITAL_COMMUNITY): Payer: Self-pay | Admitting: Hematology & Oncology

## 2016-03-05 ENCOUNTER — Ambulatory Visit (HOSPITAL_COMMUNITY): Payer: Medicare Other | Admitting: Oncology

## 2016-03-05 ENCOUNTER — Other Ambulatory Visit (HOSPITAL_COMMUNITY): Payer: Medicare Other

## 2016-03-06 ENCOUNTER — Telehealth (HOSPITAL_COMMUNITY): Payer: Self-pay | Admitting: Emergency Medicine

## 2016-03-06 DIAGNOSIS — N390 Urinary tract infection, site not specified: Secondary | ICD-10-CM | POA: Diagnosis not present

## 2016-03-06 NOTE — Telephone Encounter (Signed)
Nurse angela called and stated that pt was having burning and itching when pt was urinating.  Spoke with Dr Whitney Muse.  Orders for urinalysis and culture and sensitivity.  Faxed the orders to [facility].  [phone]

## 2016-03-07 ENCOUNTER — Ambulatory Visit (INDEPENDENT_AMBULATORY_CARE_PROVIDER_SITE_OTHER): Payer: Medicare Other | Admitting: *Deleted

## 2016-03-07 DIAGNOSIS — I495 Sick sinus syndrome: Secondary | ICD-10-CM | POA: Diagnosis not present

## 2016-03-07 NOTE — Progress Notes (Signed)
Remote pacemaker transmission.   

## 2016-03-09 ENCOUNTER — Other Ambulatory Visit (HOSPITAL_COMMUNITY): Payer: Self-pay | Admitting: Emergency Medicine

## 2016-03-09 ENCOUNTER — Other Ambulatory Visit (HOSPITAL_COMMUNITY): Payer: Medicare Other

## 2016-03-09 ENCOUNTER — Ambulatory Visit (HOSPITAL_COMMUNITY): Payer: Medicare Other | Admitting: Oncology

## 2016-03-09 ENCOUNTER — Telehealth (HOSPITAL_COMMUNITY): Payer: Self-pay | Admitting: Emergency Medicine

## 2016-03-09 MED ORDER — METHYLPREDNISOLONE 4 MG PO TBPK: ORAL_TABLET | ORAL | 0 refills | Status: DC

## 2016-03-09 NOTE — Telephone Encounter (Signed)
Hold revlimid until further notice per Dr Whitney Muse. For rash

## 2016-03-09 NOTE — Progress Notes (Signed)
The nurse tammy called the triage line last night 03/08/2016 stating that [name] had a fine rash, that was bothering her just a little bit.  Dr Whitney Muse asked me to call in a medrol dose pack and lose dose benadryl.  Tammy called and notified of these orders.  Pt is still taking revlimid as prescribed.

## 2016-03-12 ENCOUNTER — Other Ambulatory Visit (HOSPITAL_COMMUNITY): Payer: Self-pay | Admitting: Oncology

## 2016-03-12 ENCOUNTER — Encounter (HOSPITAL_COMMUNITY): Payer: Self-pay | Admitting: Oncology

## 2016-03-12 ENCOUNTER — Encounter (HOSPITAL_BASED_OUTPATIENT_CLINIC_OR_DEPARTMENT_OTHER): Payer: Medicare Other | Admitting: Oncology

## 2016-03-12 ENCOUNTER — Encounter (HOSPITAL_COMMUNITY): Payer: Medicare Other

## 2016-03-12 VITALS — BP 126/50 | HR 60 | Temp 97.8°F | Resp 16 | Ht 63.0 in | Wt 136.0 lb

## 2016-03-12 DIAGNOSIS — R21 Rash and other nonspecific skin eruption: Secondary | ICD-10-CM

## 2016-03-12 DIAGNOSIS — C9000 Multiple myeloma not having achieved remission: Secondary | ICD-10-CM

## 2016-03-12 DIAGNOSIS — E538 Deficiency of other specified B group vitamins: Secondary | ICD-10-CM | POA: Diagnosis not present

## 2016-03-12 DIAGNOSIS — D649 Anemia, unspecified: Secondary | ICD-10-CM | POA: Diagnosis not present

## 2016-03-12 DIAGNOSIS — E876 Hypokalemia: Secondary | ICD-10-CM | POA: Diagnosis not present

## 2016-03-12 HISTORY — DX: Multiple myeloma not having achieved remission: C90.00

## 2016-03-12 LAB — COMPREHENSIVE METABOLIC PANEL
ALK PHOS: 45 U/L (ref 38–126)
ALT: 11 U/L — AB (ref 14–54)
ANION GAP: 8 (ref 5–15)
AST: 15 U/L (ref 15–41)
Albumin: 3.8 g/dL (ref 3.5–5.0)
BUN: 19 mg/dL (ref 6–20)
CALCIUM: 9 mg/dL (ref 8.9–10.3)
CO2: 28 mmol/L (ref 22–32)
CREATININE: 1.27 mg/dL — AB (ref 0.44–1.00)
Chloride: 96 mmol/L — ABNORMAL LOW (ref 101–111)
GFR, EST AFRICAN AMERICAN: 41 mL/min — AB (ref >60)
GFR, EST NON AFRICAN AMERICAN: 36 mL/min — AB (ref >60)
Glucose, Bld: 120 mg/dL — ABNORMAL HIGH (ref 65–99)
Potassium: 3.4 mmol/L — ABNORMAL LOW (ref 3.5–5.1)
SODIUM: 132 mmol/L — AB (ref 135–145)
Total Bilirubin: 0.4 mg/dL (ref 0.3–1.2)
Total Protein: 5.8 g/dL — ABNORMAL LOW (ref 6.5–8.1)

## 2016-03-12 LAB — CBC WITH DIFFERENTIAL/PLATELET
Basophils Absolute: 0.1 10*3/uL (ref 0.0–0.1)
Basophils Relative: 1 %
EOS ABS: 0.3 10*3/uL (ref 0.0–0.7)
EOS PCT: 4 %
HCT: 32 % — ABNORMAL LOW (ref 36.0–46.0)
HEMOGLOBIN: 10.6 g/dL — AB (ref 12.0–15.0)
LYMPHS ABS: 2.3 10*3/uL (ref 0.7–4.0)
LYMPHS PCT: 34 %
MCH: 32.8 pg (ref 26.0–34.0)
MCHC: 33.1 g/dL (ref 30.0–36.0)
MCV: 99.1 fL (ref 78.0–100.0)
MONOS PCT: 17 %
Monocytes Absolute: 1.2 10*3/uL — ABNORMAL HIGH (ref 0.1–1.0)
Neutro Abs: 3.1 10*3/uL (ref 1.7–7.7)
Neutrophils Relative %: 44 %
PLATELETS: 310 10*3/uL (ref 150–400)
RBC: 3.23 MIL/uL — ABNORMAL LOW (ref 3.87–5.11)
RDW: 16.5 % — ABNORMAL HIGH (ref 11.5–15.5)
WBC: 6.9 10*3/uL (ref 4.0–10.5)

## 2016-03-12 MED ORDER — POTASSIUM CHLORIDE ER 10 MEQ PO TBCR: 10.0000 meq | EXTENDED_RELEASE_TABLET | Freq: Two times a day (BID) | ORAL | 0 refills | Status: DC

## 2016-03-12 MED ORDER — LENALIDOMIDE 10 MG PO CAPS: 10.0000 mg | ORAL_CAPSULE | Freq: Every day | ORAL | 0 refills | Status: DC

## 2016-03-12 MED ORDER — HYDROCORTISONE 0.5 % EX CREA: 1.0000 "application " | TOPICAL_CREAM | Freq: Two times a day (BID) | CUTANEOUS | 1 refills | Status: DC

## 2016-03-12 MED ORDER — DIPHENHYDRAMINE HCL 25 MG PO CAPS: 25.0000 mg | ORAL_CAPSULE | Freq: Four times a day (QID) | ORAL | 1 refills | Status: DC | PRN

## 2016-03-12 NOTE — Progress Notes (Signed)
Marilyn Neighbors, MD [place] [address]  Multiple myeloma not having achieved remission ([place]) - Plan: diphenhydrAMINE (BENADRYL) 25 mg capsule  Hypokalemia - Plan: potassium chloride (K-DUR) 10 MEQ tablet, DISCONTINUED: potassium chloride (K-DUR) 10 MEQ tablet  CURRENT THERAPY: Revlimid beginning on 02/24/2016 with intolerance to Dexamethasone.  INTERVAL HISTORY: [name] 82 y.o. female returns for followup of Multiple myeloma, kappa light chain restricted with bone marrow aspiration and biopsy on 01/20/2016 demonstrating 24% plasma cells.  On Revlimid/dexamethasone 10 mg daily beginning ~ 02/24/2016.  She developed intolerance to Dexamethasone (headaches) and therefore, this was discontinued.  Recently, she developed a Revlimid-induced rash and the medication was placed on hold for symptomatic management.  She started her Revlimid as described above and approximately 1 week into treatment, she developed a rash on her abdomen and back.  Additionally, she noted it on her upper legs and upper extremities.  It was pruritic in nature.  She notes that her nurse aid noticed it first during bathing.  She was advised to hold the medication and she was started on Benadryl and Medrol dose Pak.  She notes that her rash is improved.  She is here for further medical oncology recommendations.  Review of Systems  Constitutional: Negative for chills and fever.  HENT: Negative.   Eyes: Negative.  Negative for double vision.  Respiratory: Negative.  Negative for cough.   Cardiovascular: Negative.  Negative for chest pain.  Gastrointestinal: Negative.  Negative for constipation, diarrhea, nausea and vomiting.  Genitourinary: Negative.   Musculoskeletal: Negative.  Negative for falls.  Skin: Positive for itching and rash.  Neurological: Negative.   Endo/Heme/Allergies: Negative.   Psychiatric/Behavioral: Negative.     Past Medical History:  Diagnosis Date  . Anemia   .  Bradycardia   . Constipation   . Degenerative joint disease   . Diverticulosis   . External hemorrhoids   . GERD (gastroesophageal reflux disease)   . Goiter, unspecified   . Hyperlipidemia   . Hypertension   . Hypothyroidism   . Macular degeneration   . Multiple myeloma ([place]) 03/12/2016  . Osteoporosis   . Pacemaker   . Palpitations   . Seasonal allergies   . Thyrotoxicosis without mention of goiter or other cause, without mention of thyrotoxic crisis or storm     Past Surgical History:  Procedure Laterality Date  . COLONOSCOPY  November 2011   External hemorrhoids, diverticulosis, anal papillae  . COLONOSCOPY N/A 04/01/2014   Procedure: COLONOSCOPY;  Surgeon: Rogene Houston, MD;  [facility];  Service: Endoscopy;  Laterality: N/A;  930  . FEMUR IM NAIL Left 12/29/2012   Procedure: INTRAMEDULLARY (IM) NAIL INTERTROCHANTRIC;  Surgeon: Melina Schools, MD;  Location: [facility];  Service: Orthopedics;  Laterality: Left;  . INSERT / REPLACE / REMOVE PACEMAKER    . PERMANENT PACEMAKER INSERTION N/A 06/07/2011   Procedure: PERMANENT PACEMAKER INSERTION;  Surgeon: Evans Lance, MD;  Location: [facility] CATH LAB;  Service: Cardiovascular;  Laterality: N/A;  . THYROIDECTOMY    . TONSILLECTOMY    . vertebroplasty      Family History  Problem Relation Age of Onset  . Heart attack Mother     Deceased  . Cancer Father     Deceased, colon cancer age 41  . Cancer Brother     Deceased, throat and lung    Social History   Social History  . Marital status: Widowed  Spouse name: N/A  . Number of children: N/A  . Years of education: N/A   Social History Main Topics  . Smoking status: Never Smoker  . Smokeless tobacco: Never Used  . Alcohol use No  . Drug use: No  . Sexual activity: No   Other Topics Concern  . None   Social History Narrative  . None     PHYSICAL EXAMINATION  ECOG PERFORMANCE STATUS: 1 - Symptomatic but completely ambulatory  Vitals:   03/12/16  1040  BP: (!) 126/50  Pulse: 60  Resp: 16  Temp: 97.8 F (36.6 C)    GENERAL:alert, no distress, well nourished, well developed, comfortable, cooperative, smiling and accompanied by her son. SKIN: positive for: improving maculopapular rash on abdomen and low back. HEAD: Normocephalic, No masses, lesions, tenderness or abnormalities EYES: normal, EOMI, Conjunctiva are pink and non-injected EARS: External ears normal OROPHARYNX:lips, buccal mucosa, and tongue normal and mucous membranes are moist  NECK: supple, trachea midline LYMPH:  not examined BREAST:not examined LUNGS: clear to auscultation  HEART: regular rate & rhythm, no murmurs and no gallops ABDOMEN:abdomen soft and normal bowel sounds BACK: Back symmetric, no curvature. EXTREMITIES:less then 2 second capillary refill, no joint deformities, effusion, or inflammation, no skin discoloration, no cyanosis  NEURO: alert & oriented x 3 with fluent speech, no focal motor/sensory deficits, gait normal   LABORATORY DATA: CBC    Component Value Date/Time   WBC 6.9 03/12/2016 1001   RBC 3.23 (L) 03/12/2016 1001   HGB 10.6 (L) 03/12/2016 1001   HCT 32.0 (L) 03/12/2016 1001   HCT 31.7 (L) 12/14/2015 1057   PLT 310 03/12/2016 1001   MCV 99.1 03/12/2016 1001   MCH 32.8 03/12/2016 1001   MCHC 33.1 03/12/2016 1001   RDW 16.5 (H) 03/12/2016 1001   LYMPHSABS 2.3 03/12/2016 1001   MONOABS 1.2 (H) 03/12/2016 1001   EOSABS 0.3 03/12/2016 1001   BASOSABS 0.1 03/12/2016 1001      Chemistry      Component Value Date/Time   NA 132 (L) 03/12/2016 1001   NA 130 (A) 12/15/2014   K 3.4 (L) 03/12/2016 1001   CL 96 (L) 03/12/2016 1001   CO2 28 03/12/2016 1001   BUN 19 03/12/2016 1001   BUN 26 (A) 12/15/2014   CREATININE 1.27 (H) 03/12/2016 1001   GLU 79 12/15/2014      Component Value Date/Time   CALCIUM 9.0 03/12/2016 1001   ALKPHOS 45 03/12/2016 1001   AST 15 03/12/2016 1001   ALT 11 (L) 03/12/2016 1001   BILITOT 0.4  03/12/2016 1001        PENDING LABS:   RADIOGRAPHIC STUDIES:  No results found.   PATHOLOGY:    ASSESSMENT AND PLAN:  Multiple myeloma (H[facility]) Multiple myeloma, kappa light chain restricted with bone marrow aspiration and biopsy on 01/20/2016 demonstrating 24% plasma cells.  On Revlimid/dexamethasone 10 mg daily beginning ~ 02/24/2016.  She developed intolerance to Dexamethasone (headaches) and therefore, this was discontinued.  Recently, she developed a Revlimid-induced rash and the medication was placed on hold for symptomatic management.  Labs today: CBC diff, CMET.  I personally reviewed and went over laboratory results with the patient.  The results are noted within this dictation.  HGB is improved.  Hypokalemia is noted.  Rx for Kdur 10 mEq BID is printed for the patient x 2 weeks.  She is provided education regarding Revlimid-induced rash.  She is notified that this is a common side effect of  this medication.  She is advised that this is symptomatically managed and typically patients, with appropriate management, can take Revlimid through a rash.  Based upon protocol, I have written an Rx for Benadryl 25 mg to be taken every 6 hours at the first sign/symptom of rash.  Rx for Hydrocortisone which can be applied on rash for symptom management.  She will restart Revlimid on 03/18/2016.  She will continue with Claritin PO daily.  Labs in 2 weeks: CBC diff, CMET.  Return in 2 weeks for follow-up and evaluation of tolerance to Revlimid restart.   ORDERS PLACED FOR THIS ENCOUNTER: No orders of the defined types were placed in this encounter.   MEDICATIONS PRESCRIBED THIS ENCOUNTER: Meds ordered this encounter  Medications  . HYDROcodone-acetaminophen (NORCO) 7.5-325 MG tablet  . DISCONTD: potassium chloride (K-DUR) 10 MEQ tablet    Sig: Take 1 tablet (10 mEq total) by mouth 2 (two) times daily.    Dispense:  30 tablet    Refill:  0    Order Specific Question:    Supervising Provider    Answer:   Patrici Ranks [id number]  . potassium chloride (K-DUR) 10 MEQ tablet    Sig: Take 1 tablet (10 mEq total) by mouth 2 (two) times daily.    Dispense:  30 tablet    Refill:  0    Order Specific Question:   Supervising Provider    Answer:   Patrici Ranks [id number]  . hydrocortisone cream 0.5 %    Sig: Apply 1 application topically 2 (two) times daily.    Dispense:  30 g    Refill:  1    Order Specific Question:   Supervising Provider    Answer:   Patrici Ranks [id number]  . diphenhydrAMINE (BENADRYL) 25 mg capsule    Sig: Take 1 capsule (25 mg total) by mouth every 6 (six) hours as needed. Start at the first sign/symptom of rash    Dispense:  30 capsule    Refill:  1    Order Specific Question:   Supervising Provider    Answer:   Patrici Ranks [id number]    THERAPY PLAN:  Restart Revlimid  All questions were answered. The patient knows to call the clinic with any problems, questions or concerns. We can certainly see the patient much sooner if necessary.  Patient and plan discussed with Dr. Ancil Linsey and she is in agreement with the aforementioned.   This note is electronically signed by: Doy Mince 03/12/2016 2:38 PM

## 2016-03-12 NOTE — Assessment & Plan Note (Addendum)
Multiple myeloma, kappa light chain restricted with bone marrow aspiration and biopsy on 01/20/2016 demonstrating 24% plasma cells.  On Revlimid/dexamethasone 10 mg daily beginning ~ 02/24/2016.  She developed intolerance to Dexamethasone (headaches) and therefore, this was discontinued.  Recently, she developed a Revlimid-induced rash and the medication was placed on hold for symptomatic management.  Labs today: CBC diff, CMET.  I personally reviewed and went over laboratory results with the patient.  The results are noted within this dictation.  HGB is improved.  Hypokalemia is noted.  Rx for Kdur 10 mEq BID is printed for the patient x 2 weeks.  She is provided education regarding Revlimid-induced rash.  She is notified that this is a common side effect of this medication.  She is advised that this is symptomatically managed and typically patients, with appropriate management, can take Revlimid through a rash.  Based upon protocol, I have written an Rx for Benadryl 25 mg to be taken every 6 hours at the first sign/symptom of rash.  Rx for Hydrocortisone which can be applied on rash for symptom management.  She will restart Revlimid on 03/18/2016.  She will continue with Claritin PO daily.  Labs in 2 weeks: CBC diff, CMET.  Return in 2 weeks for follow-up and evaluation of tolerance to Revlimid restart.

## 2016-03-12 NOTE — Patient Instructions (Signed)
[place] at [facility] Discharge Instructions  RECOMMENDATIONS MADE BY THE CONSULTANT AND ANY TEST RESULTS WILL BE SENT TO YOUR REFERRING PHYSICIAN.  You were seen today by Kirby Crigler PA-C. Follow up and labs in 1-2 weeks.  Continue taking Claritin. Finish Medrol dose pak. Restart Revlimid on 03/18/16.  Thank you for choosing [place] at [facility] to provide your oncology and hematology care.  To afford each patient quality time with our provider, please arrive at least 15 minutes before your scheduled appointment time.   Beginning January 23rd 2017 lab work for the [facility] will be done in the  Main lab at [facility] on 1st floor. If you have a lab appointment with the [facility] please come in thru the  Main Entrance and check in at the main information desk  You need to re-schedule your appointment should you arrive 10 or more minutes late.  We strive to give you quality time with our providers, and arriving late affects you and other patients whose appointments are after yours.  Also, if you no show three or more times for appointments you may be dismissed from the clinic at the providers discretion.     Again, thank you for choosing [facility].  Our hope is that these requests will decrease the amount of time that you wait before being seen by our physicians.       _____________________________________________________________  Should you have questions after your visit to [facility], please contact our office at [phone] between the hours of 8:30 a.m. and 4:30 p.m.  Voicemails left after 4:30 p.m. will not be returned until the following business day.  For prescription refill requests, have your pharmacy contact our office.         Resources For Cancer Patients and their Caregivers ? American Cancer Society: Can assist with transportation, wigs, general needs, runs Look Good Feel  Better.        726-414-2099 ? Cancer Care: Provides financial assistance, online support groups, medication/co-pay assistance.  1-800-813-HOPE (228) 130-9207) ? [employer] Assists [place] cancer patients and their families through emotional , educational and financial support.  505-469-8534 ? [place] DSS Where to apply for food stamps, Medicaid and utility assistance. (913) 713-1483 ? RCATS: Transportation to medical appointments. 548-140-8874 ? Social Security Administration: May apply for disability if have a Stage IV cancer. 431-657-4451 7187203190 ? [employer], Disability and Transit Services: Assists with nutrition, care and transit needs. [place] Support Programs: @10RELATIVEDAYS @ > Cancer Support Group  2nd Tuesday of the month 1pm-2pm, Journey Room  > Creative Journey  3rd Tuesday of the month 1130am-1pm, Journey Room  > Look Good Feel Better  1st Wednesday of the month 10am-12 noon, Journey Room (Call [place] to register [phone])

## 2016-03-14 ENCOUNTER — Encounter: Payer: Self-pay | Admitting: Cardiology

## 2016-03-18 ENCOUNTER — Encounter (HOSPITAL_COMMUNITY): Payer: Self-pay | Admitting: Hematology & Oncology

## 2016-03-22 ENCOUNTER — Telehealth (HOSPITAL_COMMUNITY): Payer: Self-pay | Admitting: *Deleted

## 2016-03-22 NOTE — Telephone Encounter (Signed)
Caregiver from [place] called to let us know that patient was experiencing loose stools like water x 3 this morning. Pt restarted Revlimid on Sunday. Dr. Donald Pore advice was for patient to continue taking Revlimid, push oral fluids, take Imodium (already on patient's prn profile as 1 tab q4h prn), and call us on 03/23/16 around 2pm with an update. Levada Dy the caregiver said okay.

## 2016-03-23 ENCOUNTER — Telehealth (HOSPITAL_COMMUNITY): Payer: Self-pay | Admitting: *Deleted

## 2016-03-23 NOTE — Telephone Encounter (Signed)
Pt's CG called and stated that pt's diarrhea is better but her back pain has increased. CG gave pt a Norco for the pain. This was communicated to Durene Cal who communicated this information to me.

## 2016-03-26 ENCOUNTER — Other Ambulatory Visit (HOSPITAL_COMMUNITY): Payer: Self-pay

## 2016-03-26 DIAGNOSIS — C9000 Multiple myeloma not having achieved remission: Secondary | ICD-10-CM

## 2016-03-27 ENCOUNTER — Other Ambulatory Visit (HOSPITAL_COMMUNITY): Payer: Self-pay | Admitting: Oncology

## 2016-03-27 ENCOUNTER — Encounter (HOSPITAL_COMMUNITY): Payer: Self-pay | Admitting: Oncology

## 2016-03-27 ENCOUNTER — Encounter (HOSPITAL_COMMUNITY): Payer: Medicare Other | Attending: Hematology & Oncology

## 2016-03-27 ENCOUNTER — Ambulatory Visit (HOSPITAL_COMMUNITY)
Admission: RE | Admit: 2016-03-27 | Discharge: 2016-03-27 | Disposition: A | Discharge status: Home / Self Care | Payer: Medicare Other | Source: Ambulatory Visit | Attending: Oncology | Admitting: Oncology

## 2016-03-27 ENCOUNTER — Encounter (HOSPITAL_COMMUNITY): Payer: Medicare Other | Attending: Oncology | Admitting: Oncology

## 2016-03-27 VITALS — BP 129/39 | HR 59 | Temp 98.3°F | Resp 20

## 2016-03-27 DIAGNOSIS — M545 Low back pain, unspecified: Secondary | ICD-10-CM

## 2016-03-27 DIAGNOSIS — M546 Pain in thoracic spine: Secondary | ICD-10-CM | POA: Diagnosis not present

## 2016-03-27 DIAGNOSIS — Z79899 Other long term (current) drug therapy: Secondary | ICD-10-CM | POA: Insufficient documentation

## 2016-03-27 DIAGNOSIS — Z5189 Encounter for other specified aftercare: Secondary | ICD-10-CM | POA: Insufficient documentation

## 2016-03-27 DIAGNOSIS — M5136 Other intervertebral disc degeneration, lumbar region: Secondary | ICD-10-CM | POA: Insufficient documentation

## 2016-03-27 DIAGNOSIS — M8580 Other specified disorders of bone density and structure, unspecified site: Secondary | ICD-10-CM | POA: Diagnosis not present

## 2016-03-27 DIAGNOSIS — C9000 Multiple myeloma not having achieved remission: Secondary | ICD-10-CM | POA: Insufficient documentation

## 2016-03-27 LAB — COMPREHENSIVE METABOLIC PANEL
ALBUMIN: 3.7 g/dL (ref 3.5–5.0)
ALK PHOS: 45 U/L (ref 38–126)
ALT: 10 U/L — AB (ref 14–54)
AST: 15 U/L (ref 15–41)
Anion gap: 7 (ref 5–15)
BILIRUBIN TOTAL: 0.4 mg/dL (ref 0.3–1.2)
BUN: 20 mg/dL (ref 6–20)
CALCIUM: 8.8 mg/dL — AB (ref 8.9–10.3)
CO2: 31 mmol/L (ref 22–32)
CREATININE: 0.95 mg/dL (ref 0.44–1.00)
Chloride: 97 mmol/L — ABNORMAL LOW (ref 101–111)
GFR calc non Af Amer: 51 mL/min — ABNORMAL LOW (ref >60)
GFR, EST AFRICAN AMERICAN: 59 mL/min — AB (ref >60)
GLUCOSE: 108 mg/dL — AB (ref 65–99)
Potassium: 3.7 mmol/L (ref 3.5–5.1)
SODIUM: 135 mmol/L (ref 135–145)
TOTAL PROTEIN: 6 g/dL — AB (ref 6.5–8.1)

## 2016-03-27 LAB — CBC WITH DIFFERENTIAL/PLATELET
Basophils Absolute: 0.1 10*3/uL (ref 0.0–0.1)
Basophils Relative: 1 %
EOS ABS: 0.4 10*3/uL (ref 0.0–0.7)
Eosinophils Relative: 6 %
HEMATOCRIT: 31.2 % — AB (ref 36.0–46.0)
HEMOGLOBIN: 10.3 g/dL — AB (ref 12.0–15.0)
LYMPHS ABS: 1.5 10*3/uL (ref 0.7–4.0)
Lymphocytes Relative: 28 %
MCH: 33.3 pg (ref 26.0–34.0)
MCHC: 33 g/dL (ref 30.0–36.0)
MCV: 101 fL — ABNORMAL HIGH (ref 78.0–100.0)
MONOS PCT: 14 %
Monocytes Absolute: 0.8 10*3/uL (ref 0.1–1.0)
NEUTROS ABS: 2.7 10*3/uL (ref 1.7–7.7)
NEUTROS PCT: 51 %
Platelets: 276 10*3/uL (ref 150–400)
RBC: 3.09 MIL/uL — AB (ref 3.87–5.11)
RDW: 17.2 % — ABNORMAL HIGH (ref 11.5–15.5)
WBC: 5.4 10*3/uL (ref 4.0–10.5)

## 2016-03-27 MED ORDER — LENALIDOMIDE 10 MG PO CAPS: 10.0000 mg | ORAL_CAPSULE | Freq: Every day | ORAL | 0 refills | Status: DC

## 2016-03-27 NOTE — Assessment & Plan Note (Addendum)
Multiple myeloma, kappa light chain restricted with bone marrow aspiration and biopsy on 01/20/2016 demonstrating 24% plasma cells.  On Revlimid/dexamethasone 10 mg daily beginning ~ 02/24/2016.  She developed intolerance to Dexamethasone (headaches) and therefore, this was discontinued.  Her treatment course has been complicated by Revlimid-induced rash.  Labs today: CBC diff, CMET.  I personally reviewed and went over laboratory results with the patient.  The results are noted within this dictation.   She reports that her rash and pruritis is resolved.  She notes a new back pain.  It started midline and has now progressed bilaterally.  She notes that it "catches."  She notes that it is exacerbated by movement, deep breathing, and cough.  It sounds more musculoskeletal, more muscle in nature (ie muscle spasm).  Order is placed for T and L-spine xray today.  She is to continue with pain medication and muscle relaxant as ordered.  She is tolerating Revlimid well.  She reports having 7 pills left.  I wrote the medication last on 03/12/2016 and therefore, I would assume that there should be more than 7 tablets left.  I have asked Angie to look in to this.  She reports that she receives free medication from [facility] and she will call about the need for refill on Revlimid.  Angie reports back with [facility] not receiving a refill and the last Rx they have is from 03/01/2016.    Revlimid Rx is printed.  She will continue with Claritin PO daily.  Labs in 2 weeks: CBC diff, CMET, LDH, SPEP+IFE, light chain assay, and B2M.  Return in 2 weeks for follow-up and evaluation of tolerance to Revlimid.

## 2016-03-27 NOTE — Patient Instructions (Signed)
[place] at [facility] Discharge Instructions  RECOMMENDATIONS MADE BY THE CONSULTANT AND ANY TEST RESULTS WILL BE SENT TO YOUR REFERRING PHYSICIAN.  Exam with Robynn Pane, PA. We think that the pain in your back is muscular in nature, but want to do a spine xray to be sure.  Please do that as you leave today.   We will see you in two weeks for follow up and labs.   Please see Amy as you leave for appointments.    Thank you for choosing [place] at [facility] to provide your oncology and hematology care.  To afford each patient quality time with our provider, please arrive at least 15 minutes before your scheduled appointment time.   Beginning January 23rd 2017 lab work for the [facility] will be done in the  Main lab at [facility] on 1st floor. If you have a lab appointment with the [facility] please come in thru the  Main Entrance and check in at the main information desk  You need to re-schedule your appointment should you arrive 10 or more minutes late.  We strive to give you quality time with our providers, and arriving late affects you and other patients whose appointments are after yours.  Also, if you no show three or more times for appointments you may be dismissed from the clinic at the providers discretion.     Again, thank you for choosing [facility].  Our hope is that these requests will decrease the amount of time that you wait before being seen by our physicians.       _____________________________________________________________  Should you have questions after your visit to [facility], please contact our office at [phone] between the hours of 8:30 a.m. and 4:30 p.m.  Voicemails left after 4:30 p.m. will not be returned until the following business day.  For prescription refill requests, have your pharmacy contact our office.         Resources For Cancer Patients and their  Caregivers ? American Cancer Society: Can assist with transportation, wigs, general needs, runs Look Good Feel Better.        403-287-2119 ? Cancer Care: Provides financial assistance, online support groups, medication/co-pay assistance.  1-800-813-HOPE 678 459 5976) ? [facility] Assists [place] cancer patients and their families through emotional , educational and financial support.  3608889764 ? [place] DSS Where to apply for food stamps, Medicaid and utility assistance. 715-568-0443 ? RCATS: Transportation to medical appointments. (848)718-4159 ? Social Security Administration: May apply for disability if have a Stage IV cancer. 818-634-8874 870 329 9261 ? [employer], Disability and Transit Services: Assists with nutrition, care and transit needs. [place] Support Programs: @10RELATIVEDAYS @ > Cancer Support Group  2nd Tuesday of the month 1pm-2pm, Journey Room  > Creative Journey  3rd Tuesday of the month 1130am-1pm, Journey Room  > Look Good Feel Better  1st Wednesday of the month 10am-12 noon, Journey Room (Call [place] to register [phone])

## 2016-03-27 NOTE — Addendum Note (Signed)
Addended by: Josephina Shih A on: 03/27/2016 10:44 AM   Modules accepted: Orders

## 2016-03-27 NOTE — Progress Notes (Signed)
Nurse Levada Dy called and wanted to know where they needed to get the revlimid prescription from.  I explained it comes from the specialty pharmacy every time.  The company will set up delivery with the pt.  I do not know how or who this drug was delivered to previously because we do not set this up.  She also wanted to verify the potassium prescription.  Tom wrote to take for 2 weeks.  The prescription was written on 11/20 to take twice a day with 30 tablets.  So that would be the two weeks worth.  If the pt still has tablets to take bc she was suppose to be finished today then it is ok to stop the potassium today.  Will fax d/c potassium order over to [facility].

## 2016-03-27 NOTE — Progress Notes (Signed)
Marilyn Neighbors, MD [place] [address]  Multiple myeloma not having achieved remission ([facility]) - Plan: CBC with Differential, Comprehensive metabolic panel, Lactate dehydrogenase, Kappa/lambda light chains, Beta 2 microglobuline, serum, IgG, IgA, IgM, Immunofixation electrophoresis, Protein electrophoresis, serum, lenalidomide (REVLIMID) 10 MG capsule  Acute bilateral low back pain without sciatica - Plan: DG Thoracolumabar Spine  CURRENT THERAPY: Revlimid beginning on 02/24/2016 with intolerance to Dexamethasone.  INTERVAL HISTORY: [name] 82 y.o. female returns for followup of Multiple myeloma, kappa light chain restricted with bone marrow aspiration and biopsy on 01/20/2016 demonstrating 24% plasma cells.  On Revlimid/dexamethasone 10 mg daily beginning ~ 02/24/2016.  She developed intolerance to Dexamethasone (headaches) and therefore, this was discontinued.  Her treatment course has been complicated by Revlimid-induced rash.  She restarted Revlimid in accordance with our past encounter, 03/12/2016.  We received a call from her caregiver on 03/22/2016 stating that she has experienced 3 loose stools.  It was not likely from Revlimid and therefore, supportive care was recommended.  Caregiver called on 03/23/2016 to report improvement in loose stools.  Pruritis and rash is resolved.  She needed 48 hours of benadryl following our appointment and then this resolved.  She reports back pain, started 1 week ago at midline.  Noted after bending over to put an item away in a drawer.  She noted a "catch."  It has progressed bilaterally.  It is exacerbated by movement, deep breathing, and cough.  The pain has moved from right sided, to left sided, now back to right.  Review of Systems  Constitutional: Negative for chills and fever.  HENT: Negative.   Eyes: Negative.  Negative for double vision.  Respiratory: Negative.  Negative for cough.   Cardiovascular: Negative.   Negative for chest pain.  Gastrointestinal: Negative.  Negative for constipation, diarrhea, nausea and vomiting.  Genitourinary: Negative.   Musculoskeletal: Positive for back pain (new). Negative for falls.  Skin: Negative for itching and rash.  Neurological: Negative.   Endo/Heme/Allergies: Negative.   Psychiatric/Behavioral: Negative.     Past Medical History:  Diagnosis Date  . Anemia   . Bradycardia   . Constipation   . Degenerative joint disease   . Diverticulosis   . External hemorrhoids   . GERD (gastroesophageal reflux disease)   . Goiter, unspecified   . Hyperlipidemia   . Hypertension   . Hypothyroidism   . Macular degeneration   . Multiple myeloma ([facility]) 03/12/2016  . Osteoporosis   . Pacemaker   . Palpitations   . Seasonal allergies   . Thyrotoxicosis without mention of goiter or other cause, without mention of thyrotoxic crisis or storm     Past Surgical History:  Procedure Laterality Date  . COLONOSCOPY  November 2011   External hemorrhoids, diverticulosis, anal papillae  . COLONOSCOPY N/A 04/01/2014   Procedure: COLONOSCOPY;  Surgeon: Rogene Houston, MD;  [facility];  Service: Endoscopy;  Laterality: N/A;  930  . FEMUR IM NAIL Left 12/29/2012   Procedure: INTRAMEDULLARY (IM) NAIL INTERTROCHANTRIC;  Surgeon: Melina Schools, MD;  Location: [place];  Service: Orthopedics;  Laterality: Left;  . INSERT / REPLACE / REMOVE PACEMAKER    . PERMANENT PACEMAKER INSERTION N/A 06/07/2011   Procedure: PERMANENT PACEMAKER INSERTION;  Surgeon: Evans Lance, MD;  Location: [facility] CATH LAB;  Service: Cardiovascular;  Laterality: N/A;  . THYROIDECTOMY    . TONSILLECTOMY    . vertebroplasty  Family History  Problem Relation Age of Onset  . Heart attack Mother     Deceased  . Cancer Father     Deceased, colon cancer age 27  . Cancer Brother     Deceased, throat and lung    Social History   Social History  . Marital status: Widowed    Spouse name: N/A    . Number of children: N/A  . Years of education: N/A   Social History Main Topics  . Smoking status: Never Smoker  . Smokeless tobacco: Never Used  . Alcohol use No  . Drug use: No  . Sexual activity: No   Other Topics Concern  . Not on file   Social History Narrative  . No narrative on file     PHYSICAL EXAMINATION  ECOG PERFORMANCE STATUS: 1 - Symptomatic but completely ambulatory  Vitals:   03/27/16 0934  BP: (!) 129/39  Pulse: (!) 59  Resp: 20  Temp: 98.3 F (36.8 C)    GENERAL:alert, no distress, well nourished, well developed, comfortable, cooperative, smiling and accompanied by her son. SKIN: No rash HEAD: Normocephalic, No masses, lesions, tenderness or abnormalities EYES: normal, EOMI, Conjunctiva are pink and non-injected EARS: External ears normal OROPHARYNX:lips, buccal mucosa, and tongue normal and mucous membranes are moist  NECK: supple, trachea midline LYMPH:  not examined BREAST:not examined LUNGS: clear to auscultation  HEART: regular rate & rhythm, no murmurs and no gallops ABDOMEN:abdomen soft and normal bowel sounds BACK: Back symmetric, no curvature.  B/L back pain decreasing ROM as a result. EXTREMITIES:less then 2 second capillary refill, no joint deformities, effusion, or inflammation, no skin discoloration, no cyanosis  NEURO: alert & oriented x 3 with fluent speech, no focal motor/sensory deficits, gait normal   LABORATORY DATA: CBC    Component Value Date/Time   WBC 5.4 03/27/2016 0907   RBC 3.09 (L) 03/27/2016 0907   HGB 10.3 (L) 03/27/2016 0907   HCT 31.2 (L) 03/27/2016 0907   HCT 31.7 (L) 12/14/2015 1057   PLT 276 03/27/2016 0907   MCV 101.0 (H) 03/27/2016 0907   MCH 33.3 03/27/2016 0907   MCHC 33.0 03/27/2016 0907   RDW 17.2 (H) 03/27/2016 0907   LYMPHSABS 1.5 03/27/2016 0907   MONOABS 0.8 03/27/2016 0907   EOSABS 0.4 03/27/2016 0907   BASOSABS 0.1 03/27/2016 0907      Chemistry      Component Value Date/Time   NA  135 03/27/2016 0907   NA 130 (A) 12/15/2014   K 3.7 03/27/2016 0907   CL 97 (L) 03/27/2016 0907   CO2 31 03/27/2016 0907   BUN 20 03/27/2016 0907   BUN 26 (A) 12/15/2014   CREATININE 0.95 03/27/2016 0907   GLU 79 12/15/2014      Component Value Date/Time   CALCIUM 8.8 (L) 03/27/2016 0907   ALKPHOS 45 03/27/2016 0907   AST 15 03/27/2016 0907   ALT 10 (L) 03/27/2016 0907   BILITOT 0.4 03/27/2016 0907        PENDING LABS:   RADIOGRAPHIC STUDIES:  No results found.   PATHOLOGY:    ASSESSMENT AND PLAN:  Multiple myeloma (HCC) Multiple myeloma, kappa light chain restricted with bone marrow aspiration and biopsy on 01/20/2016 demonstrating 24% plasma cells.  On Revlimid/dexamethasone 10 mg daily beginning ~ 02/24/2016.  She developed intolerance to Dexamethasone (headaches) and therefore, this was discontinued.  Her treatment course has been complicated by Revlimid-induced rash.  Labs today: CBC diff, CMET.  I personally reviewed  and went over laboratory results with the patient.  The results are noted within this dictation.   She reports that her rash and pruritis is resolved.  She notes a new back pain.  It started midline and has now progressed bilaterally.  She notes that it "catches."  She notes that it is exacerbated by movement, deep breathing, and cough.  It sounds more musculoskeletal, more muscle in nature (ie muscle spasm).  Order is placed for T and L-spine xray today.  She is to continue with pain medication and muscle relaxant as ordered.  She is tolerating Revlimid well.  She reports having 7 pills left.  I wrote the medication last on 03/12/2016 and therefore, I would assume that there should be more than 7 tablets left.  I have asked Angie to look in to this.  She reports that she receives free medication from [facility] and she will call about the need for refill on Revlimid.  Angie reports back with [facility] not receiving a refill and the last Rx they have is from  03/01/2016.    Revlimid Rx is printed.  She will continue with Claritin PO daily.  Labs in 2 weeks: CBC diff, CMET, LDH, SPEP+IFE, light chain assay, and B2M.  Return in 2 weeks for follow-up and evaluation of tolerance to Revlimid.   ORDERS PLACED FOR THIS ENCOUNTER: Orders Placed This Encounter  Procedures  . DG Thoracolumabar Spine  . CBC with Differential  . Comprehensive metabolic panel  . Lactate dehydrogenase  . Kappa/lambda light chains  . Beta 2 microglobuline, serum  . IgG, IgA, IgM  . Immunofixation electrophoresis  . Protein electrophoresis, serum    MEDICATIONS PRESCRIBED THIS ENCOUNTER: Meds ordered this encounter  Medications  . lenalidomide (REVLIMID) 10 MG capsule    Sig: Take 1 capsule (10 mg total) by mouth daily.    Dispense:  28 capsule    Refill:  0    Cycle #2    Order Specific Question:   Supervising Provider    Answer:   Patrici Ranks [id number]    THERAPY PLAN:  Continue Revlimid as outlined above.  All questions were answered. The patient knows to call the clinic with any problems, questions or concerns. We can certainly see the patient much sooner if necessary.  Patient and plan discussed with Dr. Ancil Linsey and she is in agreement with the aforementioned.   This note is electronically signed by: Doy Mince 03/27/2016 10:29 AM

## 2016-03-31 LAB — CUP PACEART REMOTE DEVICE CHECK
Brady Statistic AP VP Percent: 1 %
Brady Statistic AP VS Percent: 55 %
Brady Statistic AS VP Percent: 1 %
Brady Statistic RA Percent Paced: 54 %
Brady Statistic RV Percent Paced: 1 %
Date Time Interrogation Session: 20171115083013
Implantable Lead Implant Date: 20130214
Implantable Lead Location: 753859
Lead Channel Impedance Value: 430 Ohm
Lead Channel Pacing Threshold Amplitude: 1.25 V
Lead Channel Pacing Threshold Pulse Width: 0.5 ms
Lead Channel Sensing Intrinsic Amplitude: 12 mV
Lead Channel Setting Pacing Amplitude: 2 V
Lead Channel Setting Pacing Pulse Width: 0.5 ms
Lead Channel Setting Sensing Sensitivity: 2 mV
MDC IDC LEAD IMPLANT DT: 20130214
MDC IDC LEAD LOCATION: 753860
MDC IDC MSMT BATTERY REMAINING LONGEVITY: 105 mo
MDC IDC MSMT BATTERY REMAINING PERCENTAGE: 91 %
MDC IDC MSMT BATTERY VOLTAGE: 2.95 V
MDC IDC MSMT LEADCHNL RA IMPEDANCE VALUE: 400 Ohm
MDC IDC MSMT LEADCHNL RA PACING THRESHOLD AMPLITUDE: 0.75 V
MDC IDC MSMT LEADCHNL RA SENSING INTR AMPL: 4.1 mV
MDC IDC MSMT LEADCHNL RV PACING THRESHOLD PULSEWIDTH: 0.5 ms
MDC IDC PG IMPLANT DT: 20130214
MDC IDC SET LEADCHNL RV PACING AMPLITUDE: 2.5 V
MDC IDC STAT BRADY AS VS PERCENT: 45 %
Pulse Gen Model: 2210
Pulse Gen Serial Number: 7316836

## 2016-04-05 ENCOUNTER — Other Ambulatory Visit (HOSPITAL_COMMUNITY): Payer: Self-pay | Admitting: Oncology

## 2016-04-05 DIAGNOSIS — M549 Dorsalgia, unspecified: Secondary | ICD-10-CM

## 2016-04-05 DIAGNOSIS — C9000 Multiple myeloma not having achieved remission: Secondary | ICD-10-CM

## 2016-04-09 ENCOUNTER — Ambulatory Visit (HOSPITAL_COMMUNITY)
Admission: RE | Admit: 2016-04-09 | Discharge: 2016-04-09 | Disposition: A | Discharge status: Home / Self Care | Payer: Medicare Other | Source: Ambulatory Visit | Attending: Oncology | Admitting: Oncology

## 2016-04-09 ENCOUNTER — Encounter (HOSPITAL_COMMUNITY): Payer: Medicare Other

## 2016-04-09 ENCOUNTER — Encounter (HOSPITAL_COMMUNITY): Payer: Self-pay | Admitting: Oncology

## 2016-04-09 ENCOUNTER — Encounter (HOSPITAL_BASED_OUTPATIENT_CLINIC_OR_DEPARTMENT_OTHER): Payer: Medicare Other | Admitting: Oncology

## 2016-04-09 VITALS — HR 60 | Temp 97.9°F | Resp 18 | Ht 65.0 in | Wt 135.9 lb

## 2016-04-09 DIAGNOSIS — M545 Low back pain, unspecified: Secondary | ICD-10-CM

## 2016-04-09 DIAGNOSIS — Z79899 Other long term (current) drug therapy: Secondary | ICD-10-CM | POA: Diagnosis not present

## 2016-04-09 DIAGNOSIS — C9000 Multiple myeloma not having achieved remission: Secondary | ICD-10-CM

## 2016-04-09 DIAGNOSIS — M4854XA Collapsed vertebra, not elsewhere classified, thoracic region, initial encounter for fracture: Secondary | ICD-10-CM | POA: Diagnosis not present

## 2016-04-09 DIAGNOSIS — Z5189 Encounter for other specified aftercare: Secondary | ICD-10-CM | POA: Diagnosis not present

## 2016-04-09 DIAGNOSIS — M549 Dorsalgia, unspecified: Secondary | ICD-10-CM | POA: Diagnosis not present

## 2016-04-09 DIAGNOSIS — X58XXXA Exposure to other specified factors, initial encounter: Secondary | ICD-10-CM | POA: Insufficient documentation

## 2016-04-09 LAB — COMPREHENSIVE METABOLIC PANEL
ALBUMIN: 3.5 g/dL (ref 3.5–5.0)
ALT: 12 U/L — ABNORMAL LOW (ref 14–54)
ANION GAP: 8 (ref 5–15)
AST: 17 U/L (ref 15–41)
Alkaline Phosphatase: 48 U/L (ref 38–126)
BILIRUBIN TOTAL: 0.6 mg/dL (ref 0.3–1.2)
BUN: 17 mg/dL (ref 6–20)
CO2: 27 mmol/L (ref 22–32)
Calcium: 8.8 mg/dL — ABNORMAL LOW (ref 8.9–10.3)
Chloride: 98 mmol/L — ABNORMAL LOW (ref 101–111)
Creatinine, Ser: 1.19 mg/dL — ABNORMAL HIGH (ref 0.44–1.00)
GFR, EST AFRICAN AMERICAN: 45 mL/min — AB (ref >60)
GFR, EST NON AFRICAN AMERICAN: 39 mL/min — AB (ref >60)
Glucose, Bld: 156 mg/dL — ABNORMAL HIGH (ref 65–99)
POTASSIUM: 3.7 mmol/L (ref 3.5–5.1)
Sodium: 133 mmol/L — ABNORMAL LOW (ref 135–145)
TOTAL PROTEIN: 5.7 g/dL — AB (ref 6.5–8.1)

## 2016-04-09 LAB — CBC WITH DIFFERENTIAL/PLATELET
Basophils Absolute: 0.1 10*3/uL (ref 0.0–0.1)
Basophils Relative: 2 %
Eosinophils Absolute: 0.7 10*3/uL (ref 0.0–0.7)
Eosinophils Relative: 10 %
HEMATOCRIT: 29.2 % — AB (ref 36.0–46.0)
Hemoglobin: 9.7 g/dL — ABNORMAL LOW (ref 12.0–15.0)
Lymphocytes Relative: 15 %
Lymphs Abs: 1.2 10*3/uL (ref 0.7–4.0)
MCH: 33.1 pg (ref 26.0–34.0)
MCHC: 33.2 g/dL (ref 30.0–36.0)
MCV: 99.7 fL (ref 78.0–100.0)
MONO ABS: 1.1 10*3/uL — AB (ref 0.1–1.0)
MONOS PCT: 13 %
Neutro Abs: 4.7 10*3/uL (ref 1.7–7.7)
Neutrophils Relative %: 60 %
Platelets: 260 10*3/uL (ref 150–400)
RBC: 2.93 MIL/uL — ABNORMAL LOW (ref 3.87–5.11)
RDW: 17.5 % — AB (ref 11.5–15.5)
WBC: 7.8 10*3/uL (ref 4.0–10.5)

## 2016-04-09 LAB — LACTATE DEHYDROGENASE: LDH: 182 U/L (ref 98–192)

## 2016-04-09 MED ORDER — HYDROCODONE-ACETAMINOPHEN 5-325 MG PO TABS
2.0000 | ORAL_TABLET | Freq: Once | ORAL | Status: AC
  Administered 2016-04-09: 2 via ORAL
  Filled 2016-04-09: qty 2

## 2016-04-09 NOTE — Assessment & Plan Note (Signed)
Multiple myeloma, kappa light chain restricted with bone marrow aspiration and biopsy on 01/20/2016 demonstrating 24% plasma cells.  On Revlimid/dexamethasone 10 mg daily beginning ~ 02/24/2016.  She developed intolerance to Dexamethasone (headaches) and therefore, this was discontinued.  Her treatment course has been complicated by Revlimid-induced rash.  Labs today: CBC diff, CMET, LDH, SPEP+IFE, light chain assay, and B2M.  I personally reviewed and went over laboratory results with the patient.  The results are noted within this dictation.   She reports that her rash and pruritis is resolved.  Back pain persists.  She is scheduled for CT scan of C, T, and L-spine tonight.  I personally reviewed and went over radiographic studies with the patient.  The results are noted within this dictation.    Norco 10-650 mg today in the clinic.  Current home pain regimen is effective.  She notes that it lasts 6 hours.  She would like the medication to be scheduled, but she is educated on why it is PRN and she is encouraged to ask for pain medication every 6 hours as needed for pain.  She is tolerating Revlimid well.  She will continue with 10 mg daily.  Labs in 2 weeks: CBC diff, CMET.  Return in 2 weeks for follow-up and evaluation of tolerance to Revlimid.  Once approved, she will start bone-targeted therapy with Xgeva.  We expect FDA approval VERY soon.

## 2016-04-09 NOTE — Progress Notes (Signed)
Marilyn Neighbors, MD [place] [address]  Multiple myeloma not having achieved remission [facility]) - Plan: CBC with Differential, Comprehensive metabolic panel  Acute midline low back pain without sciatica - Plan: HYDROcodone-acetaminophen (NORCO/VICODIN) 5-325 MG per tablet 2 tablet  CURRENT THERAPY: Revlimid beginning on 02/24/2016 with intolerance to Dexamethasone.  Current Revlimid dose is 10 mg daily.  INTERVAL HISTORY: [name] 82 y.o. female returns for followup of Multiple myeloma, kappa light chain restricted with bone marrow aspiration and biopsy on 01/20/2016 demonstrating 24% plasma cells.  On Revlimid/dexamethasone 10 mg daily beginning ~ 02/24/2016.  She developed intolerance to Dexamethasone (headaches) and therefore, this was discontinued.  Her treatment course has been complicated by Revlimid-induced rash.  She reports severe back pain.  This has been ongoing for 3 weeks.  We have performed plain films that were unrevealing.  She is now scheduled for CT imaging tonight.  She notes that her pain medication is effective for her.  It lasts 6 hours.  She wishes that this medication can be scheduled, but she is educated on the reason for this.  When   Review of Systems  Constitutional: Negative for chills and fever.  HENT: Negative.   Eyes: Negative.  Negative for double vision.  Respiratory: Negative.  Negative for cough.   Cardiovascular: Negative.  Negative for chest pain.  Gastrointestinal: Negative.  Negative for constipation, diarrhea, nausea and vomiting.  Genitourinary: Negative.   Musculoskeletal: Positive for back pain (new). Negative for falls.  Skin: Negative for itching and rash.  Neurological: Negative.   Endo/Heme/Allergies: Negative.   Psychiatric/Behavioral: Negative.     Past Medical History:  Diagnosis Date  . Anemia   . Bradycardia   . Constipation   . Degenerative joint disease   . Diverticulosis   . External hemorrhoids    . GERD (gastroesophageal reflux disease)   . Goiter, unspecified   . Hyperlipidemia   . Hypertension   . Hypothyroidism   . Macular degeneration   . Multiple myeloma ([place]) 03/12/2016  . Osteoporosis   . Pacemaker   . Palpitations   . Seasonal allergies   . Thyrotoxicosis without mention of goiter or other cause, without mention of thyrotoxic crisis or storm     Past Surgical History:  Procedure Laterality Date  . COLONOSCOPY  November 2011   External hemorrhoids, diverticulosis, anal papillae  . COLONOSCOPY N/A 04/01/2014   Procedure: COLONOSCOPY;  Surgeon: Rogene Houston, MD;  [facility];  Service: Endoscopy;  Laterality: N/A;  930  . FEMUR IM NAIL Left 12/29/2012   Procedure: INTRAMEDULLARY (IM) NAIL INTERTROCHANTRIC;  Surgeon: Melina Schools, MD;  Location: [place];  Service: Orthopedics;  Laterality: Left;  . INSERT / REPLACE / REMOVE PACEMAKER    . PERMANENT PACEMAKER INSERTION N/A 06/07/2011   Procedure: PERMANENT PACEMAKER INSERTION;  Surgeon: Evans Lance, MD;  Location: [facility] CATH LAB;  Service: Cardiovascular;  Laterality: N/A;  . THYROIDECTOMY    . TONSILLECTOMY    . vertebroplasty      Family History  Problem Relation Age of Onset  . Heart attack Mother     Deceased  . Cancer Father     Deceased, colon cancer age 14  . Cancer Brother     Deceased, throat and lung    Social History   Social History  . Marital status: Widowed    Spouse name: N/A  . Number of children: N/A  . Years  of education: N/A   Social History Main Topics  . Smoking status: Never Smoker  . Smokeless tobacco: Never Used  . Alcohol use No  . Drug use: No  . Sexual activity: No   Other Topics Concern  . None   Social History Narrative  . None     PHYSICAL EXAMINATION  ECOG PERFORMANCE STATUS: 1 - Symptomatic but completely ambulatory  Vitals:   04/09/16 1341  Pulse: 60  Resp: 18  Temp: 97.9 F (36.6 C)    GENERAL:alert, no distress, well nourished, well  developed, comfortable, cooperative, smiling and accompanied by her son. SKIN: No rash HEAD: Normocephalic, No masses, lesions, tenderness or abnormalities EYES: normal, EOMI, Conjunctiva are pink and non-injected EARS: External ears normal OROPHARYNX:lips, buccal mucosa, and tongue normal and mucous membranes are moist  NECK: supple, trachea midline LYMPH:  not examined BREAST:not examined LUNGS: clear to auscultation  HEART: regular rate & rhythm, no murmurs and no gallops ABDOMEN:abdomen soft and normal bowel sounds BACK: Back symmetric, no curvature.  Midline low back pain decreasing ROM as a result. EXTREMITIES:less then 2 second capillary refill, no joint deformities, effusion, or inflammation, no skin discoloration, no cyanosis  NEURO: alert & oriented x 3 with fluent speech, no focal motor/sensory deficits, gait normal   LABORATORY DATA: CBC    Component Value Date/Time   WBC 7.8 04/09/2016 1239   RBC 2.93 (L) 04/09/2016 1239   HGB 9.7 (L) 04/09/2016 1239   HCT 29.2 (L) 04/09/2016 1239   HCT 31.7 (L) 12/14/2015 1057   PLT 260 04/09/2016 1239   MCV 99.7 04/09/2016 1239   MCH 33.1 04/09/2016 1239   MCHC 33.2 04/09/2016 1239   RDW 17.5 (H) 04/09/2016 1239   LYMPHSABS 1.2 04/09/2016 1239   MONOABS 1.1 (H) 04/09/2016 1239   EOSABS 0.7 04/09/2016 1239   BASOSABS 0.1 04/09/2016 1239      Chemistry      Component Value Date/Time   NA 133 (L) 04/09/2016 1239   NA 130 (A) 12/15/2014   K 3.7 04/09/2016 1239   CL 98 (L) 04/09/2016 1239   CO2 27 04/09/2016 1239   BUN 17 04/09/2016 1239   BUN 26 (A) 12/15/2014   CREATININE 1.19 (H) 04/09/2016 1239   GLU 79 12/15/2014      Component Value Date/Time   CALCIUM 8.8 (L) 04/09/2016 1239   ALKPHOS 48 04/09/2016 1239   AST 17 04/09/2016 1239   ALT 12 (L) 04/09/2016 1239   BILITOT 0.6 04/09/2016 1239        PENDING LABS:   RADIOGRAPHIC STUDIES:  Dg Thoracic Spine W/swimmers  Result Date: 03/27/2016 CLINICAL DATA:   Back pain.  No known injury. EXAM: THORACIC SPINE - 3 VIEWS COMPARISON:  11/25/2015. FINDINGS: Diffuse multilevel degenerative change and osteopenia noted. Prior mid thoracic vertebroplasties, these appears stable. Multiple old mild thoracic vertebral body compression fractures. No new compression fracture noted. Cardiac pacer noted in stable position. IMPRESSION: Diffuse multilevel degenerative change in osteopenia. Prior mid thoracic vertebroplasties again noted. These appears stable. Multiple old mild thoracic vertebral body compression fractures. No new compression fracture noted. If symptoms persist MRI may prove useful for further evaluation. Electronically Signed   By: Marcello Moores  Register   On: 03/27/2016 11:17   Dg Lumbar Spine Complete  Result Date: 03/27/2016 CLINICAL DATA:  Hervey Ard back pain for 1 week, no injury EXAM: LUMBAR SPINE - COMPLETE 4+ VIEW COMPARISON:  Lumbar spine films of 12/03/2014 FINDINGS: The bones are diffusely osteopenic. The lumbar  vertebrae remain in normal alignment. There is diffuse degenerative disc disease primarily involving L2-3, L3-4, L4-5, and L5-S1 levels. There is loss of intervertebral disc space with sclerosis and spurring at these levels. No compression deformity is seen. Vertebroplasties have been performed at T7 and T8. Calcification in the pelvis is most consistent with calcified uterine fibroid. IMPRESSION: 1. Diffuse degenerative disc disease throughout the lumbar spine. No compression deformity. 2. Vertebroplasty at T7 and T8. 3. Diffuse osteopenia. Electronically Signed   By: Ivar Drape M.D.   On: 03/27/2016 11:23     PATHOLOGY:    ASSESSMENT AND PLAN:  Multiple myeloma (HCC) Multiple myeloma, kappa light chain restricted with bone marrow aspiration and biopsy on 01/20/2016 demonstrating 24% plasma cells.  On Revlimid/dexamethasone 10 mg daily beginning ~ 02/24/2016.  She developed intolerance to Dexamethasone (headaches) and therefore, this was  discontinued.  Her treatment course has been complicated by Revlimid-induced rash.  Labs today: CBC diff, CMET, LDH, SPEP+IFE, light chain assay, and B2M.  I personally reviewed and went over laboratory results with the patient.  The results are noted within this dictation.   She reports that her rash and pruritis is resolved.  Back pain persists.  She is scheduled for CT scan of C, T, and L-spine tonight.  I personally reviewed and went over radiographic studies with the patient.  The results are noted within this dictation.    Norco 10-650 mg today in the clinic.  Current home pain regimen is effective.  She notes that it lasts 6 hours.  She would like the medication to be scheduled, but she is educated on why it is PRN and she is encouraged to ask for pain medication every 6 hours as needed for pain.  She is tolerating Revlimid well.  She will continue with 10 mg daily.  Labs in 2 weeks: CBC diff, CMET.  Return in 2 weeks for follow-up and evaluation of tolerance to Revlimid.  Once approved, she will start bone-targeted therapy with Xgeva.  We expect FDA approval VERY soon.   ORDERS PLACED FOR THIS ENCOUNTER: Orders Placed This Encounter  Procedures  . CBC with Differential  . Comprehensive metabolic panel    MEDICATIONS PRESCRIBED THIS ENCOUNTER: Meds ordered this encounter  Medications  . tiZANidine (ZANAFLEX) 4 MG tablet  . HYDROcodone-acetaminophen (NORCO/VICODIN) 5-325 MG per tablet 2 tablet    THERAPY PLAN:  Continue Revlimid as outlined above.  All questions were answered. The patient knows to call the clinic with any problems, questions or concerns. We can certainly see the patient much sooner if necessary.  Patient and plan discussed with Dr. Ancil Linsey and she is in agreement with the aforementioned.   This note is electronically signed by: Doy Mince 04/09/2016 6:42 PM

## 2016-04-09 NOTE — Patient Instructions (Addendum)
[place] at [facility] Discharge Instructions  RECOMMENDATIONS MADE BY THE CONSULTANT AND ANY TEST RESULTS WILL BE SENT TO YOUR REFERRING PHYSICIAN.  You were seen today by Kirby Crigler PA-C. Keep appointments as scheduled.   Thank you for choosing [place] at [facility] to provide your oncology and hematology care.  To afford each patient quality time with our provider, please arrive at least 15 minutes before your scheduled appointment time.   Beginning January 23rd 2017 lab work for the [facility] will be done in the  Main lab at [facility] on 1st floor. If you have a lab appointment with the [facility] please come in thru the  Main Entrance and check in at the main information desk  You need to re-schedule your appointment should you arrive 10 or more minutes late.  We strive to give you quality time with our providers, and arriving late affects you and other patients whose appointments are after yours.  Also, if you no show three or more times for appointments you may be dismissed from the clinic at the providers discretion.     Again, thank you for choosing [facility].  Our hope is that these requests will decrease the amount of time that you wait before being seen by our physicians.       _____________________________________________________________  Should you have questions after your visit to [facility], please contact our office at [phone] between the hours of 8:30 a.m. and 4:30 p.m.  Voicemails left after 4:30 p.m. will not be returned until the following business day.  For prescription refill requests, have your pharmacy contact our office.         Resources For Cancer Patients and their Caregivers ? American Cancer Society: Can assist with transportation, wigs, general needs, runs Look Good Feel Better.        314-697-5430 ? Cancer Care: Provides financial assistance, online  support groups, medication/co-pay assistance.  1-800-813-HOPE (226)637-7437) ? [facility] Assists [place] cancer patients and their families through emotional , educational and financial support.  567-558-2933 ? [place] DSS Where to apply for food stamps, Medicaid and utility assistance. (802)138-7477 ? [facility]: Transportation to medical appointments. (737) 873-5094 ? Social Security Administration: May apply for disability if have a Stage IV cancer. 9071781322 831-326-3014 ? [employer], Disability and Transit Services: Assists with nutrition, care and transit needs. [place] Support Programs: @10RELATIVEDAYS @ > Cancer Support Group  2nd Tuesday of the month 1pm-2pm, Journey Room  > Creative Journey  3rd Tuesday of the month 1130am-1pm, Journey Room  > Look Good Feel Better  1st Wednesday of the month 10am-12 noon, Journey Room (Call [facility] to register [phone])

## 2016-04-10 ENCOUNTER — Telehealth (HOSPITAL_COMMUNITY): Payer: Self-pay | Admitting: *Deleted

## 2016-04-10 LAB — BETA 2 MICROGLOBULIN, SERUM: Beta-2 Microglobulin: 3.6 mg/L — ABNORMAL HIGH (ref 0.6–2.4)

## 2016-04-10 LAB — IGG, IGA, IGM
IGA: 520 mg/dL — AB (ref 64–422)
IGG (IMMUNOGLOBIN G), SERUM: 335 mg/dL — AB (ref 700–1600)
IGM, SERUM: 22 mg/dL — AB (ref 26–217)

## 2016-04-10 LAB — KAPPA/LAMBDA LIGHT CHAINS
Kappa free light chain: 221.7 mg/L — ABNORMAL HIGH (ref 3.3–19.4)
Kappa, lambda light chain ratio: 13.44 — ABNORMAL HIGH (ref 0.26–1.65)
Lambda free light chains: 16.5 mg/L (ref 5.7–26.3)

## 2016-04-10 LAB — IMMUNOFIXATION ELECTROPHORESIS
IGA: 538 mg/dL — AB (ref 64–422)
IGG (IMMUNOGLOBIN G), SERUM: 328 mg/dL — AB (ref 700–1600)
IgM, Serum: 22 mg/dL — ABNORMAL LOW (ref 26–217)
Total Protein ELP: 5.7 g/dL — ABNORMAL LOW (ref 6.0–8.5)

## 2016-04-10 NOTE — Telephone Encounter (Signed)
Spoke with the pt's son and went over the plan of care and results, he is on board and says that he thinks PT will be helpful for his mother as well.    I spoke with Tammy at [facility] and she advised they would need a faxed order to start the pt's treatment.

## 2016-04-11 ENCOUNTER — Encounter (HOSPITAL_COMMUNITY): Payer: Self-pay | Admitting: Emergency Medicine

## 2016-04-11 NOTE — Progress Notes (Signed)
refaxed PT order to [facility].

## 2016-04-12 DIAGNOSIS — M4312 Spondylolisthesis, cervical region: Secondary | ICD-10-CM | POA: Diagnosis not present

## 2016-04-12 DIAGNOSIS — I10 Essential (primary) hypertension: Secondary | ICD-10-CM | POA: Diagnosis not present

## 2016-04-12 DIAGNOSIS — C9000 Multiple myeloma not having achieved remission: Secondary | ICD-10-CM | POA: Diagnosis not present

## 2016-04-12 DIAGNOSIS — Z7982 Long term (current) use of aspirin: Secondary | ICD-10-CM | POA: Diagnosis not present

## 2016-04-12 DIAGNOSIS — H3530 Unspecified macular degeneration: Secondary | ICD-10-CM | POA: Diagnosis not present

## 2016-04-12 DIAGNOSIS — M4184 Other forms of scoliosis, thoracic region: Secondary | ICD-10-CM | POA: Diagnosis not present

## 2016-04-12 DIAGNOSIS — Z950 Presence of cardiac pacemaker: Secondary | ICD-10-CM | POA: Diagnosis not present

## 2016-04-12 DIAGNOSIS — Z7952 Long term (current) use of systemic steroids: Secondary | ICD-10-CM | POA: Diagnosis not present

## 2016-04-12 DIAGNOSIS — M4004 Postural kyphosis, thoracic region: Secondary | ICD-10-CM | POA: Diagnosis not present

## 2016-04-12 DIAGNOSIS — M353 Polymyalgia rheumatica: Secondary | ICD-10-CM | POA: Diagnosis not present

## 2016-04-12 DIAGNOSIS — M8008XD Age-related osteoporosis with current pathological fracture, vertebra(e), subsequent encounter for fracture with routine healing: Secondary | ICD-10-CM | POA: Diagnosis not present

## 2016-04-12 LAB — PROTEIN ELECTROPHORESIS, SERUM
A/G RATIO SPE: 1.7 (ref 0.7–1.7)
Albumin ELP: 3.6 g/dL (ref 2.9–4.4)
Alpha-1-Globulin: 0.3 g/dL (ref 0.0–0.4)
Alpha-2-Globulin: 0.5 g/dL (ref 0.4–1.0)
BETA GLOBULIN: 0.7 g/dL (ref 0.7–1.3)
GLOBULIN, TOTAL: 2.1 g/dL — AB (ref 2.2–3.9)
Gamma Globulin: 0.6 g/dL (ref 0.4–1.8)
M-SPIKE, %: 0.2 g/dL — AB
Total Protein ELP: 5.7 g/dL — ABNORMAL LOW (ref 6.0–8.5)

## 2016-04-13 ENCOUNTER — Telehealth (HOSPITAL_COMMUNITY): Payer: Self-pay

## 2016-04-13 NOTE — Telephone Encounter (Signed)
PT called and said they would be going out twice a week for 4 weeks. They will fax orders to be signed.

## 2016-04-17 DIAGNOSIS — M4004 Postural kyphosis, thoracic region: Secondary | ICD-10-CM | POA: Diagnosis not present

## 2016-04-17 DIAGNOSIS — M8008XD Age-related osteoporosis with current pathological fracture, vertebra(e), subsequent encounter for fracture with routine healing: Secondary | ICD-10-CM | POA: Diagnosis not present

## 2016-04-17 DIAGNOSIS — M4312 Spondylolisthesis, cervical region: Secondary | ICD-10-CM | POA: Diagnosis not present

## 2016-04-17 DIAGNOSIS — M4184 Other forms of scoliosis, thoracic region: Secondary | ICD-10-CM | POA: Diagnosis not present

## 2016-04-17 DIAGNOSIS — M353 Polymyalgia rheumatica: Secondary | ICD-10-CM | POA: Diagnosis not present

## 2016-04-17 DIAGNOSIS — C9000 Multiple myeloma not having achieved remission: Secondary | ICD-10-CM | POA: Diagnosis not present

## 2016-04-18 ENCOUNTER — Telehealth (HOSPITAL_COMMUNITY): Payer: Self-pay | Admitting: Hematology & Oncology

## 2016-04-18 NOTE — Telephone Encounter (Signed)
FAXED ENROLLMENT FORM TO [facility] FOR ASSIST IN 2018

## 2016-04-19 DIAGNOSIS — M4004 Postural kyphosis, thoracic region: Secondary | ICD-10-CM | POA: Diagnosis not present

## 2016-04-19 DIAGNOSIS — M353 Polymyalgia rheumatica: Secondary | ICD-10-CM | POA: Diagnosis not present

## 2016-04-19 DIAGNOSIS — M4184 Other forms of scoliosis, thoracic region: Secondary | ICD-10-CM | POA: Diagnosis not present

## 2016-04-19 DIAGNOSIS — C9000 Multiple myeloma not having achieved remission: Secondary | ICD-10-CM | POA: Diagnosis not present

## 2016-04-19 DIAGNOSIS — M4312 Spondylolisthesis, cervical region: Secondary | ICD-10-CM | POA: Diagnosis not present

## 2016-04-19 DIAGNOSIS — M8008XD Age-related osteoporosis with current pathological fracture, vertebra(e), subsequent encounter for fracture with routine healing: Secondary | ICD-10-CM | POA: Diagnosis not present

## 2016-04-24 ENCOUNTER — Other Ambulatory Visit (HOSPITAL_COMMUNITY): Payer: Self-pay | Admitting: Oncology

## 2016-04-24 ENCOUNTER — Encounter (HOSPITAL_COMMUNITY): Payer: Self-pay | Admitting: Lab

## 2016-04-24 DIAGNOSIS — M4004 Postural kyphosis, thoracic region: Secondary | ICD-10-CM | POA: Diagnosis not present

## 2016-04-24 DIAGNOSIS — M545 Low back pain, unspecified: Secondary | ICD-10-CM

## 2016-04-24 DIAGNOSIS — M4184 Other forms of scoliosis, thoracic region: Secondary | ICD-10-CM | POA: Diagnosis not present

## 2016-04-24 DIAGNOSIS — M4312 Spondylolisthesis, cervical region: Secondary | ICD-10-CM | POA: Diagnosis not present

## 2016-04-24 DIAGNOSIS — M8008XD Age-related osteoporosis with current pathological fracture, vertebra(e), subsequent encounter for fracture with routine healing: Secondary | ICD-10-CM | POA: Diagnosis not present

## 2016-04-24 DIAGNOSIS — C9000 Multiple myeloma not having achieved remission: Secondary | ICD-10-CM | POA: Diagnosis not present

## 2016-04-24 DIAGNOSIS — M353 Polymyalgia rheumatica: Secondary | ICD-10-CM | POA: Diagnosis not present

## 2016-04-24 MED ORDER — METAXALONE 800 MG PO TABS: 800.0000 mg | ORAL_TABLET | Freq: Three times a day (TID) | ORAL | 1 refills | Status: DC

## 2016-04-24 NOTE — Progress Notes (Unsigned)
Referral sent to Dr Rolena Infante / [facility]. Records faxed on 1/2

## 2016-04-26 ENCOUNTER — Encounter (HOSPITAL_COMMUNITY): Payer: Medicare Other

## 2016-04-26 ENCOUNTER — Other Ambulatory Visit (HOSPITAL_COMMUNITY): Payer: Self-pay | Admitting: Oncology

## 2016-04-26 ENCOUNTER — Encounter (HOSPITAL_COMMUNITY): Payer: Medicare Other | Attending: Hematology & Oncology | Admitting: Hematology & Oncology

## 2016-04-26 ENCOUNTER — Encounter (HOSPITAL_COMMUNITY): Payer: Self-pay | Admitting: Hematology & Oncology

## 2016-04-26 VITALS — BP 160/46 | HR 60 | Temp 98.2°F | Resp 20 | Wt 136.0 lb

## 2016-04-26 DIAGNOSIS — M4184 Other forms of scoliosis, thoracic region: Secondary | ICD-10-CM | POA: Diagnosis not present

## 2016-04-26 DIAGNOSIS — I10 Essential (primary) hypertension: Secondary | ICD-10-CM

## 2016-04-26 DIAGNOSIS — M8008XD Age-related osteoporosis with current pathological fracture, vertebra(e), subsequent encounter for fracture with routine healing: Secondary | ICD-10-CM | POA: Diagnosis not present

## 2016-04-26 DIAGNOSIS — C9000 Multiple myeloma not having achieved remission: Secondary | ICD-10-CM

## 2016-04-26 DIAGNOSIS — Z9221 Personal history of antineoplastic chemotherapy: Secondary | ICD-10-CM | POA: Insufficient documentation

## 2016-04-26 DIAGNOSIS — M810 Age-related osteoporosis without current pathological fracture: Secondary | ICD-10-CM

## 2016-04-26 DIAGNOSIS — R109 Unspecified abdominal pain: Secondary | ICD-10-CM | POA: Diagnosis not present

## 2016-04-26 DIAGNOSIS — D630 Anemia in neoplastic disease: Secondary | ICD-10-CM

## 2016-04-26 DIAGNOSIS — Z5189 Encounter for other specified aftercare: Secondary | ICD-10-CM | POA: Insufficient documentation

## 2016-04-26 DIAGNOSIS — D649 Anemia, unspecified: Secondary | ICD-10-CM

## 2016-04-26 DIAGNOSIS — K5900 Constipation, unspecified: Secondary | ICD-10-CM | POA: Diagnosis not present

## 2016-04-26 DIAGNOSIS — M4004 Postural kyphosis, thoracic region: Secondary | ICD-10-CM | POA: Diagnosis not present

## 2016-04-26 DIAGNOSIS — Z9889 Other specified postprocedural states: Secondary | ICD-10-CM | POA: Diagnosis not present

## 2016-04-26 DIAGNOSIS — E538 Deficiency of other specified B group vitamins: Secondary | ICD-10-CM

## 2016-04-26 DIAGNOSIS — M545 Low back pain, unspecified: Secondary | ICD-10-CM

## 2016-04-26 DIAGNOSIS — M353 Polymyalgia rheumatica: Secondary | ICD-10-CM

## 2016-04-26 DIAGNOSIS — R54 Age-related physical debility: Secondary | ICD-10-CM

## 2016-04-26 DIAGNOSIS — M4312 Spondylolisthesis, cervical region: Secondary | ICD-10-CM | POA: Diagnosis not present

## 2016-04-26 LAB — COMPREHENSIVE METABOLIC PANEL
ALBUMIN: 3.7 g/dL (ref 3.5–5.0)
ALK PHOS: 51 U/L (ref 38–126)
ALT: 12 U/L — AB (ref 14–54)
AST: 15 U/L (ref 15–41)
Anion gap: 7 (ref 5–15)
BILIRUBIN TOTAL: 0.4 mg/dL (ref 0.3–1.2)
BUN: 14 mg/dL (ref 6–20)
CALCIUM: 9.1 mg/dL (ref 8.9–10.3)
CO2: 29 mmol/L (ref 22–32)
CREATININE: 1.11 mg/dL — AB (ref 0.44–1.00)
Chloride: 98 mmol/L — ABNORMAL LOW (ref 101–111)
GFR calc Af Amer: 49 mL/min — ABNORMAL LOW (ref >60)
GFR, EST NON AFRICAN AMERICAN: 42 mL/min — AB (ref >60)
GLUCOSE: 98 mg/dL (ref 65–99)
Potassium: 3.8 mmol/L (ref 3.5–5.1)
Sodium: 134 mmol/L — ABNORMAL LOW (ref 135–145)
TOTAL PROTEIN: 6.1 g/dL — AB (ref 6.5–8.1)

## 2016-04-26 LAB — CBC WITH DIFFERENTIAL/PLATELET
BASOS ABS: 0.1 10*3/uL (ref 0.0–0.1)
BASOS PCT: 2 %
Eosinophils Absolute: 0.6 10*3/uL (ref 0.0–0.7)
Eosinophils Relative: 8 %
HEMATOCRIT: 30.5 % — AB (ref 36.0–46.0)
HEMOGLOBIN: 10 g/dL — AB (ref 12.0–15.0)
LYMPHS PCT: 18 %
Lymphs Abs: 1.3 10*3/uL (ref 0.7–4.0)
MCH: 33 pg (ref 26.0–34.0)
MCHC: 32.8 g/dL (ref 30.0–36.0)
MCV: 100.7 fL — AB (ref 78.0–100.0)
Monocytes Absolute: 1.1 10*3/uL — ABNORMAL HIGH (ref 0.1–1.0)
Monocytes Relative: 15 %
NEUTROS ABS: 4.2 10*3/uL (ref 1.7–7.7)
NEUTROS PCT: 57 %
Platelets: 269 10*3/uL (ref 150–400)
RBC: 3.03 MIL/uL — ABNORMAL LOW (ref 3.87–5.11)
RDW: 18.1 % — ABNORMAL HIGH (ref 11.5–15.5)
WBC: 7.2 10*3/uL (ref 4.0–10.5)

## 2016-04-26 MED ORDER — LENALIDOMIDE 10 MG PO CAPS: 10.0000 mg | ORAL_CAPSULE | Freq: Every day | ORAL | 0 refills | Status: DC

## 2016-04-26 NOTE — Patient Instructions (Addendum)
[place] at [facility] Discharge Instructions  RECOMMENDATIONS MADE BY THE CONSULTANT AND ANY TEST RESULTS WILL BE SENT TO YOUR REFERRING PHYSICIAN.  You were seen today by Dr. Whitney Muse Follow up next week with Dr. Whitney Muse Discontinue Revlmid for now, continue taking Aspirin We will give you constipation sheet, please call us if you have any questions.   Thank you for choosing [place] at [facility] to provide your oncology and hematology care.  To afford each patient quality time with our provider, please arrive at least 15 minutes before your scheduled appointment time.    If you have a lab appointment with the [facility] please come in thru the  Main Entrance and check in at the main information desk  You need to re-schedule your appointment should you arrive 10 or more minutes late.  We strive to give you quality time with our providers, and arriving late affects you and other patients whose appointments are after yours.  Also, if you no show three or more times for appointments you may be dismissed from the clinic at the providers discretion.     Again, thank you for choosing [facility].  Our hope is that these requests will decrease the amount of time that you wait before being seen by our physicians.       _____________________________________________________________  Should you have questions after your visit to [facility], please contact our office at [phone] between the hours of 8:30 a.m. and 4:30 p.m.  Voicemails left after 4:30 p.m. will not be returned until the following business day.  For prescription refill requests, have your pharmacy contact our office.       Resources For Cancer Patients and their Caregivers ? American Cancer Society: Can assist with transportation, wigs, general needs, runs Look Good Feel Better.        ([phone] 424-2501 ? Cancer Care: Provides financial  assistance, online support groups, medication/co-pay assistance.  1-800-813-HOPE 256-699-2804) ? [facility] Assists [place] cancer patients and their families through emotional , educational and financial support.  314-364-8938 ? [place] DSS Where to apply for food stamps, Medicaid and utility assistance. (346)703-9398 ? [employer]: Transportation to medical appointments. 385-019-0647 ? Social Security Administration: May apply for disability if have a Stage IV cancer. 215-049-7048 (646)027-4535 ? [employer], Disability and Transit Services: Assists with nutrition, care and transit needs. [place] Support Programs: @10RELATIVEDAYS @ > Cancer Support Group  2nd Tuesday of the month 1pm-2pm, Journey Room  > Creative Journey  3rd Tuesday of the month 1130am-1pm, Journey Room  > Look Good Feel Better  1st Wednesday of the month 10am-12 noon, Journey Room (Call [place] to register [phone])

## 2016-04-26 NOTE — Progress Notes (Signed)
HEMATOLOGY/ONCOLOGY PROGRESS NOTE  Date of Service: 04/26/2016  Patient Care Team: Celene Squibb, MD as PCP - General  CHIEF COMPLAINTS/PURPOSE OF CONSULTATION:  Anemia Multiple Myeloma, IgA kappa  HISTORY OF PRESENTING ILLNESS:  [name] is a wonderful 83 y.o. female who has been referred to [place] by Dr .Wende Neighbors, MD  for evaluation and management of multiple myeloma, IgA kappa.   Patient is a very pleasant 83 year old with a history of hypertension, dyslipidemia, hypothyroidism, osteoporosis, polymyalgia rheumatica reports that she had iron deficiency anemia for which she received IV iron more than a year ago.  She presents to the clinic today in a wheelchair with her son.   I personally reviewed and went over lab results and imaging results with the patient.  She has had compression fractures in the past  She states she isn't getting better with physical therapy, her back pain persists.   She is groggy all the time and always sleepy. She is concerned that it might be due to the muscle relaxer she is taking.   Has back pain on her right side but now it has extends to her left side as well. States she is in so much pain she can hardly put her clothes on. She also complains of abdominal pain and swelling. She is usually constipated and not having daily Bm. She hasn't had a bowel movement in 2 days. She takes Miralax and stool softener but it hasn't helped much.  She takes a muscle relaxer 3x a day  She says her abdomen hurts some, but mostly in her back.  States she can't walk too much. Says she took her last Revlimid this morning.  MEDICAL HISTORY:  Past Medical History:  Diagnosis Date  . Anemia   . Bradycardia   . Constipation   . Degenerative joint disease   . Diverticulosis   . External hemorrhoids   . GERD (gastroesophageal reflux disease)   . Goiter, unspecified   . Hyperlipidemia   . Hypertension   . Hypothyroidism   . Macular degeneration   . Multiple  myeloma ([place]) 03/12/2016  . Osteoporosis   . Pacemaker   . Palpitations   . Seasonal allergies   . Thyrotoxicosis without mention of goiter or other cause, without mention of thyrotoxic crisis or storm     SURGICAL HISTORY: Past Surgical History:  Procedure Laterality Date  . COLONOSCOPY  November 2011   External hemorrhoids, diverticulosis, anal papillae  . COLONOSCOPY N/A 04/01/2014   Procedure: COLONOSCOPY;  Surgeon: Rogene Houston, MD;  [facility];  Service: Endoscopy;  Laterality: N/A;  930  . FEMUR IM NAIL Left 12/29/2012   Procedure: INTRAMEDULLARY (IM) NAIL INTERTROCHANTRIC;  Surgeon: Melina Schools, MD;  Location: [place];  Service: Orthopedics;  Laterality: Left;  . INSERT / REPLACE / REMOVE PACEMAKER    . PERMANENT PACEMAKER INSERTION N/A 06/07/2011   Procedure: PERMANENT PACEMAKER INSERTION;  Surgeon: Evans Lance, MD;  Location: [facility] CATH LAB;  Service: Cardiovascular;  Laterality: N/A;  . THYROIDECTOMY    . TONSILLECTOMY    . vertebroplasty      SOCIAL HISTORY: Social History   Social History  . Marital status: Widowed    Spouse name: N/A  . Number of children: N/A  . Years of education: N/A   Occupational History  . Not on file.   Social History Main Topics  . Smoking status: Never Smoker  . Smokeless tobacco: Never Used  .  Alcohol use No  . Drug use: No  . Sexual activity: No   Other Topics Concern  . Not on file   Social History Narrative  . No narrative on file    FAMILY HISTORY: Family History  Problem Relation Age of Onset  . Heart attack Mother     Deceased  . Cancer Father     Deceased, colon cancer age 11  . Cancer Brother     Deceased, throat and lung    ALLERGIES:  is allergic to tramadol and sulfa antibiotics.  MEDICATIONS:  Current Outpatient Prescriptions  Medication Sig Dispense Refill  . acetaminophen (TYLENOL) 500 MG tablet Take 1,000 mg by mouth every 6 (six) hours as needed for mild pain.     Marland Kitchen ALPRAZolam  (XANAX) 0.25 MG tablet Take 0.25 mg by mouth at bedtime as needed for sleep.    Marland Kitchen amLODipine (NORVASC) 5 MG tablet Take 5 mg by mouth at bedtime.     Marland Kitchen aspirin 325 MG tablet Take 81 mg by mouth daily.     . Calcium Carbonate Antacid (CAL-GEST ANTACID PO) Take by mouth.    . carvedilol (COREG) 6.25 MG tablet Take 1 tablet (6.25 mg total) by mouth 2 (two) times daily with a meal. 60 tablet 0  . cycloSPORINE (RESTASIS) 0.05 % ophthalmic emulsion Place 1 drop into both eyes 2 (two) times daily. *Self Administration*    . diphenhydrAMINE (BENADRYL) 25 mg capsule Take 1 capsule (25 mg total) by mouth every 6 (six) hours as needed. Start at the first sign/symptom of rash 30 capsule 1  . diphenhydrAMINE (BENYLIN) 12.5 MG/5ML syrup Take 5 mLs (12.5 mg total) by mouth 4 (four) times daily as needed for itching or allergies. 120 mL 0  . docusate sodium (COLACE) 100 MG capsule Take 100 mg by mouth at bedtime. For constipation    . Ferrous Sulfate Dried (FERROUS SULFATE CR PO) Take by mouth.    . fluticasone (FLONASE) 50 MCG/ACT nasal spray Place 2 sprays into the nose daily. For allergies    . hydrALAZINE (APRESOLINE) 10 MG tablet Take 10 mg by mouth 2 (two) times daily.    Marland Kitchen HYDROcodone-acetaminophen (NORCO) 10-325 MG tablet Take by mouth every 6 (six) hours as needed for pain.    . hydrocortisone (ANUSOL-HC) 2.5 % rectal cream Place 1 application rectally 2 (two) times daily.    . hydrocortisone cream 0.5 % Apply 1 application topically 2 (two) times daily. 30 g 1  . Hypertonic Nasal Wash (SINUS RINSE NA) Place 1 application into both nostrils daily.    Marland Kitchen lenalidomide (REVLIMID) 10 MG capsule Take 1 capsule (10 mg total) by mouth daily. 28 capsule 0  . levothyroxine (SYNTHROID, LEVOTHROID) 75 MCG tablet Take 75 mcg by mouth daily before breakfast.     . loperamide (IMODIUM) 2 MG capsule Take 2 mg by mouth every 4 (four) hours as needed for diarrhea or loose stools.    Marland Kitchen loratadine (CLARITIN) 10 MG tablet  Take 10 mg by mouth daily.    . magnesium hydroxide (MILK OF MAGNESIA) 400 MG/5ML suspension Take 0.5 mLs by mouth as needed for mild constipation.    . metaxalone (SKELAXIN) 800 MG tablet Take 1 tablet (800 mg total) by mouth 3 (three) times daily. 30 tablet 1  . methylPREDNISolone (MEDROL DOSEPAK) 4 MG TBPK tablet Take as prescribed 21 tablet 0  . multivitamin-lutein (OCUVITE-LUTEIN) CAPS capsule Take 1 capsule by mouth 2 (two) times daily.    Marland Kitchen  omeprazole (PRILOSEC) 20 MG capsule Take 20 mg by mouth daily. Reported on 06/25/2015    . polyethylene glycol (MIRALAX / GLYCOLAX) packet Take 17 g by mouth daily.    . predniSONE (DELTASONE) 5 MG tablet Take 5 mg by mouth daily with breakfast.    . sertraline (ZOLOFT) 50 MG tablet Take 50 mg by mouth daily.     No current facility-administered medications for this visit.     REVIEW OF SYSTEMS:   Review of Systems  Constitutional: Positive for malaise/fatigue (associated with muscle relaxer).  Eyes: Negative.   Respiratory: Negative.   Cardiovascular: Negative.   Gastrointestinal: Positive for abdominal pain and constipation.       Abdominal swelling.  Genitourinary: Negative.   Musculoskeletal: Positive for back pain.  Skin: Negative.   Endo/Heme/Allergies: Negative.   Psychiatric/Behavioral: Negative.   All other systems reviewed and are negative. 14 point review of systems was performed and is negative except as detailed under history of present illness and above   PHYSICAL EXAMINATION: ECOG PERFORMANCE STATUS: 3 - Symptomatic, >50% confined to bed Vitals with BMI 04/26/2016  Height   Weight 136 lbs  BMI   Systolic 025  Diastolic 46  Pulse 60  Respirations 20   Physical Exam  Constitutional: She is oriented to person, place, and time and well-developed, well-nourished, and in no distress.  Physical exam done In a wheelchair.  HENT:  Head: Normocephalic and atraumatic.  Eyes: EOM are normal. Pupils are equal, round, and reactive  to light. No scleral icterus.  Neck: Normal range of motion. Neck supple.  Cardiovascular: Normal rate, regular rhythm and normal heart sounds.   Pulmonary/Chest: Effort normal and breath sounds normal.  She experienced tenderness with palpation of back during lung auscultation.   Abdominal: Soft. Bowel sounds are normal. She exhibits no distension.  Musculoskeletal: Normal range of motion.  Lymphadenopathy:    She has no cervical adenopathy.  Neurological: She is alert and oriented to person, place, and time. No cranial nerve deficit. Gait normal.  Skin: Skin is warm and dry.  Psychiatric: Mood, memory, affect and judgment normal.  Nursing note and vitals reviewed.   LABORATORY DATA:  I have reviewed the data as listed  . CBC Latest Ref Rng & Units 04/09/2016 03/27/2016 03/12/2016  WBC 4.0 - 10.5 K/uL 7.8 5.4 6.9  Hemoglobin 12.0 - 15.0 g/dL 9.7(L) 10.3(L) 10.6(L)  Hematocrit 36.0 - 46.0 % 29.2(L) 31.2(L) 32.0(L)  Platelets 150 - 400 K/uL 260 276 310    . CMP Latest Ref Rng & Units 04/09/2016 03/27/2016 03/12/2016  Glucose 65 - 99 mg/dL 156(H) 108(H) 120(H)  BUN 6 - 20 mg/dL 17 20 19   Creatinine 0.44 - 1.00 mg/dL 1.19(H) 0.95 1.27(H)  Sodium 135 - 145 mmol/L 133(L) 135 132(L)  Potassium 3.5 - 5.1 mmol/L 3.7 3.7 3.4(L)  Chloride 101 - 111 mmol/L 98(L) 97(L) 96(L)  CO2 22 - 32 mmol/L 27 31 28   Calcium 8.9 - 10.3 mg/dL 8.8(L) 8.8(L) 9.0  Total Protein 6.5 - 8.1 g/dL 5.7(L) 6.0(L) 5.8(L)  Total Bilirubin 0.3 - 1.2 mg/dL 0.6 0.4 0.4  Alkaline Phos 38 - 126 U/L 48 45 45  AST 15 - 41 U/L 17 15 15   ALT 14 - 54 U/L 12(L) 10(L) 11(L)   Results for SALA, TAGUE (MRN [id number])   Ref. Range 12/14/2015 10:57 12/14/2015 10:57  Iron Latest Ref Range: 28 - 170 ug/dL 75   UIBC Latest Units: ug/dL 195   TIBC Latest Ref  Range: 250 - 450 ug/dL 270   Saturation Ratios Latest Ref Range: 10.4 - 31.8 % 28   Ferritin Latest Ref Range: 11 - 307 ng/mL 208   Folate, Hemolysate Latest Ref Range:  Not Estab. ng/mL  608.7  Folate, RBC Latest Ref Range: >498 ng/mL  1,920  Vitamin B12 Latest Ref Range: 180 - 914 pg/mL 577    Results for SANTINA, TRILLO (MRN [id number])  Ref. Range 12/14/2015 10:57  LDH Latest Ref Range: 98 - 192 U/L 167   Results for CAMBRYN, CHARTERS (MRN [id number])   Ref. Range 12/14/2015 10:57  Kappa free light chain Latest Ref Range: 3.3 - 19.4 mg/L 205.1 (H)  Lamda free light chains Latest Ref Range: 5.7 - 26.3 mg/L 9.7  Kappa, lamda light chain ratio Latest Ref Range: 0.26 - 1.65  21.14 (H)        RADIOGRAPHIC STUDIES: I have personally reviewed the radiological images as listed and agreed with the findings in the report. Ct Cervical Spine Wo Contrast  Result Date: 04/10/2016 CLINICAL DATA:  Multiple myeloma not having achieved remission. Back pain EXAM: CT CERVICAL SPINE WITHOUT CONTRAST TECHNIQUE: Multidetector CT imaging of the cervical spine was performed without intravenous contrast. Multiplanar CT image reconstructions were also generated. COMPARISON:  None. FINDINGS: Alignment: 2.5 mm anterolisthesis C4-5. 4 mm anterolisthesis at C5-6. Remaining alignment normal. Skull base and vertebrae: Negative for cervical spine fracture. No lytic lesions are seen consistent with myeloma. 1 cm fat containing lesion T2 vertebral body on the right consistent with hemangioma. Soft tissues and spinal canal: Negative for mass or adenopathy. Carotid artery calcification. Disc levels: C2-3: Disc degeneration with disc calcification. No significant stenosis bilateral facet fusion, left greater than right. C3-4: Moderate facet degeneration on the left. Mild uncinate spurring on the left. No significant spinal or foraminal encroachment. Left-sided facet fusion. C4-5: 2.5 mm anterolisthesis with moderate to advanced facet degeneration bilaterally. No significant spinal stenosis. Mild foraminal narrowing bilaterally C5-6: 4 mm anterolisthesis. Moderate to advanced facet degeneration on the  left. Mild degeneration of the right facet. No significant spinal stenosis C6-7:  Moderate disc degeneration without stenosis. C7-T1:  Negative Upper chest: Negative Other: None IMPRESSION: Cervical spine degenerative changes as above. Negative for fracture or mass. No evidence of myeloma in the cervical spine. Electronically Signed   By: Franchot Gallo M.D.   On: 04/10/2016 09:15   Ct Thoracic Spine Wo Contrast  Result Date: 04/10/2016 CLINICAL DATA:  Multiple myeloma.  Acute midline back pain EXAM: CT THORACIC SPINE WITHOUT CONTRAST TECHNIQUE: Multidetector CT images of the thoracic were obtained using the standard protocol without intravenous contrast. COMPARISON:  Thoracic spine x-rays 03/27/2016.  CT chest 06/30/2015 FINDINGS: Alignment: Mild thoracic levoscoliosis. Normal sagittal alignment. Mild to moderate thoracic kyphosis at T7. Vertebrae: Moderate fracture of T6 with prior vertebroplasty cement similar to the prior study. Mild fracture of T7 with vertebroplasty cement in satisfactory position. Moderate compression fracture of T8 with vertebral vertebroplasty cement in satisfactory position bilaterally. Mild loss of height of T9, T10, T11 unchanged from 06/30/2015. No acute fracture. No lytic lesions are identified in the thoracic spine. Paraspinal and other soft tissues: Dual lead cardiac pacemaker. Diffuse atherosclerotic disease in the aorta. Small right pleural effusion. Mild dependent atelectasis in the lung bases bilaterally. Disc levels: Disc degeneration and mild spurring in the lower thoracic spine from T8 through T12. No significant spinal stenosis. No large disc protrusion. IMPRESSION: Negative for thoracic mass lesion Multiple chronic thoracic compression fractures. No  acute fracture. Prior vertebroplasty of T6, T7, and T8 vertebral bodies. Electronically Signed   By: Franchot Gallo M.D.   On: 04/10/2016 09:27   Ct Lumbar Spine Wo Contrast  Result Date: 04/10/2016 CLINICAL DATA:   Multiple myeloma.  Acute midline back pain EXAM: CT LUMBAR SPINE WITHOUT CONTRAST TECHNIQUE: Multidetector CT imaging of the lumbar spine was performed without intravenous contrast administration. Multiplanar CT image reconstructions were also generated. COMPARISON:  Lumbar spine radiographs 03/27/2016. CT lumbar spine 09/08/2012 FINDINGS: Segmentation: Normal Alignment: Normal Vertebrae: Negative for fracture or mass. No lytic lesions are seen consistent with multiple myeloma. Generalized osteopenia. Paraspinal and other soft tissues: Atherosclerotic calcification. No aortic aneurysm. Negative for retroperitoneal adenopathy. Disc levels: T12-L1: Disc degeneration with mild spurring and vacuum disc phenomena. Negative for spinal stenosis L1-2: Disc degeneration with gas in the disc space. Mild disc bulging without significant stenosis. Mild facet degeneration. L2-3: Advanced disc degeneration with disc space narrowing. Endplate spurring and gas in the disc space. Mild facet hypertrophy and mild spinal stenosis. Mild left foraminal encroachment due to spurring C3-4: Advanced disc degeneration with disc space narrowing and gas in the disc space. Endplate spurring and mild facet degeneration contribute to mild spinal stenosis. Neural foramina adequately patent bilaterally L4-5: Moderate to advanced disc degeneration with disc space narrowing and diffuse endplate spurring. Mild facet hypertrophy. No significant spinal or foraminal encroachment L5-S1: Disc degeneration with diffuse endplate spurring. Central osteophyte touching the thecal sac. No significant neural impingement or stenosis. IMPRESSION: Negative for lumbar spine fracture. Negative for lytic lesion of multiple myeloma in the lumbar spine Advanced lumbar degenerative changes as above. Electronically Signed   By: Franchot Gallo M.D.   On: 04/10/2016 09:21    ASSESSMENT & PLAN:  IgA kappa myeloma Normocytic anemia HX iron deficiency HX IV iron  replacement Polymyalgia rheumatica  Back Pain  83 year old female with multiple medical comorbidities currently residing in assisted living at [facility] diagnosed with multiple myeloma She could not tolerate dexamethasone as it caused her headaches.  Revlimid has backpain listed as a potential side effect, at this point without another obvious explanation of her back pain and given the timing of her back pain I have asked her to hold the medication.  She is to continue on her aspirin.   Labs and CT reviewed, results are noted above.  I talked about  Her taking Senokot for her constipation.I will prescribe her Senokot.She was given a constipation care sheet.  I will follow up with her in 1 week to assess back pain. If better we will cancel orthopedic appointment and discuss future treatment recommendations.   She is scheduled for a follow up visit on 05/03/16.  Her son will keep [place] posted regarding her progress. She is to continue with PT at [facility].   All of the patients questions were answered with apparent satisfaction. The patient knows to call the clinic with any problems, questions or concerns.  This document serves as a record of services personally performed by Ancil Linsey, MD. It was created on her behalf by Shirlean Mylar, a trained medical scribe. The creation of this record is based on the scribe's personal observations and the provider's statements to them. This document has been checked and approved by the attending provider.  I have reviewed the above documentation for accuracy and completeness and I agree with the above.  Kelby Fam. Penland, MD 04/26/2016 11:13 AM

## 2016-04-30 ENCOUNTER — Telehealth (HOSPITAL_COMMUNITY): Payer: Self-pay | Admitting: Emergency Medicine

## 2016-04-30 ENCOUNTER — Other Ambulatory Visit (HOSPITAL_COMMUNITY): Payer: Self-pay | Admitting: Oncology

## 2016-04-30 DIAGNOSIS — M8008XD Age-related osteoporosis with current pathological fracture, vertebra(e), subsequent encounter for fracture with routine healing: Secondary | ICD-10-CM | POA: Diagnosis not present

## 2016-04-30 DIAGNOSIS — M4312 Spondylolisthesis, cervical region: Secondary | ICD-10-CM | POA: Diagnosis not present

## 2016-04-30 DIAGNOSIS — M353 Polymyalgia rheumatica: Secondary | ICD-10-CM | POA: Diagnosis not present

## 2016-04-30 DIAGNOSIS — C9000 Multiple myeloma not having achieved remission: Secondary | ICD-10-CM

## 2016-04-30 DIAGNOSIS — M4184 Other forms of scoliosis, thoracic region: Secondary | ICD-10-CM | POA: Diagnosis not present

## 2016-04-30 DIAGNOSIS — M4004 Postural kyphosis, thoracic region: Secondary | ICD-10-CM | POA: Diagnosis not present

## 2016-04-30 MED ORDER — LENALIDOMIDE 10 MG PO CAPS: 10.0000 mg | ORAL_CAPSULE | Freq: Every day | ORAL | 0 refills | Status: DC

## 2016-04-30 NOTE — Telephone Encounter (Signed)
Levada Dy called and stated that Ason had been having headaches since she started taking the skelaxin 800 mg 3 times a day.  Spoke with Dr Whitney Muse, wants pt to just stop taking the medication.  Order faxed to [facility] to d/c skelaxin.  Faxed confirmed.

## 2016-05-01 ENCOUNTER — Telehealth (HOSPITAL_COMMUNITY): Payer: Self-pay | Admitting: Hematology & Oncology

## 2016-05-01 NOTE — Telephone Encounter (Signed)
Per the request of [employer] faxed script to [facility] to see if they can obtain funding for the pts revlimid.

## 2016-05-03 ENCOUNTER — Encounter (HOSPITAL_COMMUNITY): Payer: Self-pay | Admitting: Hematology & Oncology

## 2016-05-03 ENCOUNTER — Encounter (HOSPITAL_BASED_OUTPATIENT_CLINIC_OR_DEPARTMENT_OTHER): Payer: Medicare Other | Admitting: Hematology & Oncology

## 2016-05-03 ENCOUNTER — Encounter (HOSPITAL_COMMUNITY): Payer: Self-pay | Admitting: Emergency Medicine

## 2016-05-03 VITALS — BP 155/50 | HR 60 | Temp 98.8°F | Resp 16 | Wt 135.4 lb

## 2016-05-03 DIAGNOSIS — R54 Age-related physical debility: Secondary | ICD-10-CM

## 2016-05-03 DIAGNOSIS — M4312 Spondylolisthesis, cervical region: Secondary | ICD-10-CM | POA: Diagnosis not present

## 2016-05-03 DIAGNOSIS — M4184 Other forms of scoliosis, thoracic region: Secondary | ICD-10-CM | POA: Diagnosis not present

## 2016-05-03 DIAGNOSIS — M545 Low back pain, unspecified: Secondary | ICD-10-CM

## 2016-05-03 DIAGNOSIS — C9000 Multiple myeloma not having achieved remission: Secondary | ICD-10-CM

## 2016-05-03 DIAGNOSIS — D649 Anemia, unspecified: Secondary | ICD-10-CM | POA: Diagnosis not present

## 2016-05-03 DIAGNOSIS — M4004 Postural kyphosis, thoracic region: Secondary | ICD-10-CM | POA: Diagnosis not present

## 2016-05-03 DIAGNOSIS — M353 Polymyalgia rheumatica: Secondary | ICD-10-CM

## 2016-05-03 DIAGNOSIS — M8008XD Age-related osteoporosis with current pathological fracture, vertebra(e), subsequent encounter for fracture with routine healing: Secondary | ICD-10-CM | POA: Diagnosis not present

## 2016-05-03 DIAGNOSIS — D630 Anemia in neoplastic disease: Secondary | ICD-10-CM

## 2016-05-03 NOTE — Progress Notes (Signed)
Chemotherapy teaching pulled together. 

## 2016-05-03 NOTE — Patient Instructions (Signed)
[place]   CHEMOTHERAPY INSTRUCTIONS   POTENTIAL SIDE EFFECTS OF TREATMENT:  Velcade - You will take this weekly here at the [facility]. This will be an injection in your abdominal tissue. Side Effects: peripheral neuropathy, (numbness, tingling, burning in hands/fingers/feet/toes), hypotension, nausea, vomiting, diarrhea, blurred vision, fatigue, bone marrow suppression (low white blood cells-fight infection, low red blood cells- this makes up your hemoglobin & circulating blood volume, low platelets-this is what helps your blood to clot)   When to contact your doctor or health care provider: Contact your health care provider immediately, day or night, if you should experience any of the following symptoms: Fever of 100.4 F (38 C) or higher, chills (possible signs of infection)  Shortness of breath, wheezing, difficulty breathing, closing up of the throat, swelling of facial features, hives (possible allergic reaction). The following symptoms require medical attention, but are not an emergency. Contact your health care provider within 24 hours of noticing any of the following: Nausea (interferes with ability to eat and unrelieved with prescribed medication).  Vomiting (vomiting more than 4-5 times in a 24 hour period).  Diarrhea (4-6 episodes in a 24-hour period).  Constipation unrelieved by laxative use.  Unusual bleeding or bruising  Black or tarry stools, or blood in your stools.  Blood in the urine.  Extreme fatigue (unable to carry on self-care activities).  New or worsening symptoms of peripheral neuropathy.  Swelling, redness and/or pain in one leg or arm and not the other.  Swelling of the feet or ankles. Sudden weight gain.  Signs of infection such as redness or swelling, pain on swallowing, coughing up mucous, or painful urination.  Unable to eat or drink for 24 hours or have signs of dehydration: tiredness, thirst, dry mouth, dark and  decrease amount of urine, or dizziness. Always inform your health care provider if you experience any unusual symptoms. Precautions: Before starting Velcade treatment, make sure you tell your doctor about any other medications you are taking (including prescription, over-the-counter, vitamins, herbal remedies, etc.). Do not take aspirin, or products containing aspirin unless your doctor specifically permits this.  Do not receive any kind of immunization or vaccination without your doctor's approval while taking Velcade.  Inform your health care professional if you are pregnant or may be pregnant prior to starting this treatment. Pregnancy category D (Velcade may be hazardous to the fetus. Women who are pregnant or become pregnant must be advised of the potential hazard to the fetus.)  For both men and women: Do not conceive a child (get pregnant) while taking Velcade. Barrier methods of contraception, such as condoms, are recommended. Discuss with your doctor when you may safely become pregnant or conceive a child after therapy.  Do not breast feed while taking Velcade. Self-Care Tips: You may experience drowsiness or dizziness; avoid driving or engaging in tasks that require alertness until your response to the drug is known.  Drink at least two to three quarts of fluid every 24 hours, unless you are instructed otherwise.  You may be at risk of infection report fever or any other signs of infection immediately to your health care provider.  Avoid contact sports or activities that could cause injury.  To reduce nausea, take anti-nausea medications as prescribed by your doctor, and eat small, frequent meals.  In general, drinking alcoholic beverages should be kept to a minimum or avoided completely. You should discuss this with your doctor.  Get plenty of rest.  Maintain  good nutrition.  If you experience symptoms or side effects, be sure to discuss them with your health care team. They can prescribe  medications and/or offer other suggestions that are effective in managing such problems.  Keep your bowels moving. Your health care provider may prescribe a stool softener to help prevent constipation that may be caused by this medicine.  Follow regimen of anti-diarrhea medication as prescribed by your health care professional.  Eat foods that may help reduce diarrhea. (see managing side effects - diarrhea).  Acetaminophen or ibuprophen may help relieve discomfort from fever, headache and/or generalized aches and pains. However, be sure to talk with your doctor before taking it.     SELF CARE ACTIVITIES WHILE ON CHEMOTHERAPY: Hydration Increase your fluid intake 48 hours prior to treatment and drink at least 8 to 12 cups (64 ounces) of water/decaff beverages per day after treatment. You can still have your cup of coffee or soda but these beverages do not count as part of your 8 to 12 cups that you need to drink daily. No alcohol intake.  Medications Continue taking your normal prescription medication as prescribed.  If you start any new herbal or new supplements please let us know first to make sure it is safe.  Mouth Care Have teeth cleaned professionally before starting treatment. Keep dentures and partial plates clean. Use soft toothbrush and do not use mouthwashes that contain alcohol. Biotene is a good mouthwash that is available at most pharmacies or may be ordered by calling [phone]. Use warm salt water gargles (1 teaspoon salt per 1 quart warm water) before and after meals and at bedtime. Or you may rinse with 2 tablespoons of three-percent hydrogen peroxide mixed in eight ounces of water. If you are still having problems with your mouth or sores in your mouth please call the clinic. If you need dental work, please let Dr. Whitney Muse know before you go for your appointment so that we can coordinate the best possible time for you in regards to your chemo regimen. You need to also let  your dentist know that you are actively taking chemo. We may need to do labs prior to your dental appointment.   Skin Care Always use sunscreen that has not expired and with SPF (Sun Protection Factor) of 50 or higher. Wear hats to protect your head from the sun. Remember to use sunscreen on your hands, ears, face, & feet.  Use good moisturizing lotions such as udder cream, eucerin, or even Vaseline. Some chemotherapies can cause dry skin, color changes in your skin and nails.    . Avoid long, hot showers or baths. . Use gentle, fragrance-free soaps and laundry detergent. . Use moisturizers, preferably creams or ointments rather than lotions because the thicker consistency is better at preventing skin dehydration. Apply the cream or ointment within 15 minutes of showering. Reapply moisturizer at night, and moisturize your hands every time after you wash them.  Hair Loss (if your doctor says your hair will fall out)  . If your doctor says that your hair is likely to fall out, decide before you begin chemo whether you want to wear a wig. You may want to shop before treatment to match your hair color. . Hats, turbans, and scarves can also camouflage hair loss, although some people prefer to leave their heads uncovered. If you go bare-headed outdoors, be sure to use sunscreen on your scalp. . Cut your hair short. It eases the inconvenience of shedding lots of  hair, but it also can reduce the emotional impact of watching your hair fall out. . Don't perm or color your hair during chemotherapy. Those chemical treatments are already damaging to hair and can enhance hair loss. Once your chemo treatments are done and your hair has grown back, it's OK to resume dyeing or perming hair. With chemotherapy, hair loss is almost always temporary. But when it grows back, it may be a different color or texture. In older adults who still had hair color before chemotherapy, the new growth may be completely gray.  Often,  new hair is very fine and soft.  Infection Prevention Please wash your hands for at least 30 seconds using warm soapy water. Handwashing is the #1 way to prevent the spread of germs. Stay away from sick people or people who are getting over a cold. If you develop respiratory systems such as green/yellow mucus production or productive cough or persistent cough let us know and we will see if you need an antibiotic. It is a good idea to keep a pair of gloves on when going into grocery stores/[employer] to decrease your risk of coming into contact with germs on the carts, etc. Carry alcohol hand gel with you at all times and use it frequently if out in public. If your temperature reaches 100.5 or higher please call the clinic and let us know.  If it is after hours or on the weekend please go to the ER if your temperature is over 100.5.  Please have your own personal thermometer at home to use.    Sex and bodily fluids If you are going to have sex, a condom must be used to protect the person that isn't taking chemotherapy. Chemo can decrease your libido (sex drive). For a few days after chemotherapy, chemotherapy can be excreted through your bodily fluids.  When using the toilet please close the lid and flush the toilet twice.  Do this for a few day after you have had chemotherapy.     Effects of chemotherapy on your sex life Some changes are simple and won't last long. They won't affect your sex life permanently. Sometimes you may feel: . too tired . not strong enough to be very active . sick or sore  . not in the mood . anxious or low Your anxiety might not seem related to sex. For example, you may be worried about the cancer and how your treatment is going. Or you may be worried about money, or about how you family are coping with your illness. These things can cause stress, which can affect your interest in sex. It's important to talk to your partner about how you feel. Remember - the changes to your  sex life don't usually last long. There's usually no medical reason to stop having sex during chemo. The drugs won't have any long term physical effects on your performance or enjoyment of sex. Cancer can't be passed on to your partner during sex  Contraception It's important to use reliable contraception during treatment. Avoid getting pregnant while you or your partner are having chemotherapy. This is because the drugs may harm the baby. Sometimes chemotherapy drugs can leave a man or woman infertile.  This means you would not be able to have children in the future. You might want to talk to someone about permanent infertility. It can be very difficult to learn that you may no longer be able to have children. Some people find counselling helpful. There might be ways  to preserve your fertility, although this is easier for men than for women. You may want to speak to a fertility expert. You can talk about sperm banking or harvesting your eggs. You can also ask about other fertility options, such as donor eggs. If you have or have had breast cancer, your doctor might advise you not to take the contraceptive pill. This is because the hormones in it might affect the cancer.  It is not known for sure whether or not chemotherapy drugs can be passed on through semen or secretions from the vagina. Because of this some doctors advise people to use a barrier method if you have sex during treatment. This applies to vaginal, anal or oral sex. Generally, doctors advise a barrier method only for the time you are actually having the treatment and for about a week after your treatment. Advice like this can be worrying, but this does not mean that you have to avoid being intimate with your partner. You can still have close contact with your partner and continue to enjoy sex.  Animals If you have cats or birds we just ask that you not change the litter or change the cage.  Please have someone else do this for you while  you are on chemotherapy.   Food Safety During and After Cancer Treatment Food safety is important for people both during and after cancer treatment. Cancer and cancer treatments, such as chemotherapy, radiation therapy, and stem cell/bone marrow transplantation, often weaken the immune system. This makes it harder for your body to protect itself from foodborne illness, also called food poisoning. Foodborne illness is caused by eating food that contains harmful bacteria, parasites, or viruses.  Foods to avoid Some foods have a higher risk of becoming tainted with bacteria. These include: Marland Kitchen Unwashed fresh fruit and vegetables, especially leafy vegetables that can hide dirt and other contaminants . Raw sprouts, such as alfalfa sprouts . Raw or undercooked beef, especially ground beef, or other raw or undercooked meat and poultry . Fatty, fried, or spicy foods immediately before or after treatment.  These can sit heavy on your stomach and make you feel nauseous. . Raw or undercooked shellfish, such as oysters. . Sushi and sashimi, which often contain raw fish.  . Unpasteurized beverages, such as unpasteurized fruit juices, raw milk, raw yogurt, or cider . Undercooked eggs, such as soft boiled, over easy, and poached; raw, unpasteurized eggs; or foods made with raw egg, such as homemade raw cookie dough and homemade mayonnaise Simple steps for food safety Shop smart. . Do not buy food stored or displayed in an unclean area. . Do not buy bruised or damaged fruits or vegetables. . Do not buy cans that have cracks, dents, or bulges. . Pick up foods that can spoil at the end of your shopping trip and store them in a cooler on the way home. Prepare and clean up foods carefully. . Rinse all fresh fruits and vegetables under running water, and dry them with a clean towel or paper towel. . Clean the top of cans before opening them. . After preparing food, wash your hands for 20 seconds with hot water and  soap. Pay special attention to areas between fingers and under nails. . Clean your utensils and dishes with hot water and soap. Marland Kitchen Disinfect your kitchen and cutting boards using 1 teaspoon of liquid, unscented bleach mixed into 1 quart of water.   Dispose of old food. . Eat canned and packaged food before its  expiration date (the "use by" or "best before" date). . Consume refrigerated leftovers within 3 to 4 days. After that time, throw out the food. Even if the food does not smell or look spoiled, it still may be unsafe. Some bacteria, such as Listeria, can grow even on foods stored in the refrigerator if they are kept for too long. Take precautions when eating out. . At restaurants, avoid buffets and salad bars where food sits out for a long time and comes in contact with many people. Food can become contaminated when someone with a virus, often a norovirus, or another "bug" handles it. . Put any leftover food in a "to-go" container yourself, rather than having the server do it. And, refrigerate leftovers as soon as you get home. . Choose restaurants that are clean and that are willing to prepare your food as you order it cooked.    MEDICATIONS: Over-the-Counter Meds:  Miralax 1 capful in 8 oz of fluid daily. May increase to two times a day if needed. This is a stool softener. If this doesn't work proceed you can add:  Senokot S-start with 1 tablet two times a day and increase to 4 tablets two times a day if needed. (total of 8 tablets in a 24 hour period). This is a stimulant laxative.   Call us if this does not help your bowels move.   Imodium 2mg  capsule. Take 2 capsules after the 1st loose stool and then 1 capsule every 2 hours until you go a total of 12 hours without having a loose stool. Call the [facility] if loose stools continue. If diarrhea occurs @ bedtime, take 2 capsules @ bedtime. Then take 2 capsules every 4 hours until morning. Call [facility].    SYMPTOMS TO REPORT  AS SOON AS POSSIBLE AFTER TREATMENT:  FEVER GREATER THAN 100.5 F  CHILLS WITH OR WITHOUT FEVER  NAUSEA AND VOMITING THAT IS NOT CONTROLLED WITH YOUR NAUSEA MEDICATION  UNUSUAL SHORTNESS OF BREATH  UNUSUAL BRUISING OR BLEEDING  TENDERNESS IN MOUTH AND THROAT WITH OR WITHOUT PRESENCE OF ULCERS  URINARY PROBLEMS  BOWEL PROBLEMS  UNUSUAL RASH    Wear comfortable clothing and clothing appropriate for easy access to any Portacath or PICC line. Let us know if there is anything that we can do to make your therapy better!    What to do if you need assistance after hours or on the weekends: CALL [phone].  HOLD on the line, do not hang up.  You will hear multiple messages but at the end you will be connected with a nurse triage line.  They will contact Dr Whitney Muse if necessary.  Most of the time they will be able to assist you.   Do not call the hospital operator.  Dr Whitney Muse will not answer phone calls received by them.     I have been informed and understand all of the instructions given to me and have received a copy. I have been instructed to call the clinic [phone] or my family physician as soon as possible for continued medical care, if indicated. I do not have any more questions at this time but understand that I may call the [place] [facility] or the Patient Navigator at [phone] during office hours should I have questions or need assistance in obtaining follow-up care.

## 2016-05-03 NOTE — Progress Notes (Signed)
HEMATOLOGY/ONCOLOGY PROGRESS NOTE  Date of Service: 05/03/2016  Patient Care Team: Celene Squibb, MD as PCP - General  CHIEF COMPLAINTS/PURPOSE OF CONSULTATION:  Anemia Multiple Myeloma, IgA kappa  HISTORY OF PRESENTING ILLNESS:  [name] is a wonderful 83 y.o. female who has been referred to [place] by Dr .Wende Neighbors, MD  for evaluation and management of multiple myeloma, IgA kappa.   Patient is a very pleasant 83 year old with a history of hypertension, dyslipidemia, hypothyroidism, osteoporosis, polymyalgia rheumatica reports that she had iron deficiency anemia for which she received IV iron more than a year ago.  Peripheral evaluation was relatively unremarkable, small M spike was noted, abnormal kappa/lambda light chain ratio was noted. She ultimately chose to undergo BMBX and has been diagnosed with myeloma.   She presents today with her son for ongoing discussion and follow-up of her labs and treatment options. I personally reviewed and went over labs with the patient.  She states she hasn't been taking her  flonase States she is taking claritin for allergies.She states she has a Headache. She noticed it started after taking her muscle relaxer. She thinks she has a sinus headache.  Usually she uses a saline solution when she got them in the past for relief.    She states she tries to eat and then she feels sick. She takes her thyroid medicine in the morning. She takes her other medicine after breakfast. Reports nothing tastes good when she eats. She doesn't like the food that she gets.    She has pain in her back that is attributed to her previous compression fractures. She asked if she could get relief for that.   She is better since she stopped Revlimid. She notes that her back spasms are markedly improved and her pain is almost gone. She still has an upcoming appointment with orthopedics. She has a history of compression FX.  MEDICAL HISTORY:  Past Medical History:  Diagnosis  Date  . Anemia   . Bradycardia   . Constipation   . Degenerative joint disease   . Diverticulosis   . External hemorrhoids   . GERD (gastroesophageal reflux disease)   . Goiter, unspecified   . Hyperlipidemia   . Hypertension   . Hypothyroidism   . Macular degeneration   . Multiple myeloma ([place]) 03/12/2016  . Osteoporosis   . Pacemaker   . Palpitations   . Seasonal allergies   . Thyrotoxicosis without mention of goiter or other cause, without mention of thyrotoxic crisis or storm     SURGICAL HISTORY: Past Surgical History:  Procedure Laterality Date  . COLONOSCOPY  November 2011   External hemorrhoids, diverticulosis, anal papillae  . COLONOSCOPY N/A 04/01/2014   Procedure: COLONOSCOPY;  Surgeon: Rogene Houston, MD;  [facility];  Service: Endoscopy;  Laterality: N/A;  930  . FEMUR IM NAIL Left 12/29/2012   Procedure: INTRAMEDULLARY (IM) NAIL INTERTROCHANTRIC;  Surgeon: Melina Schools, MD;  Location: [facility];  Service: Orthopedics;  Laterality: Left;  . INSERT / REPLACE / REMOVE PACEMAKER    . PERMANENT PACEMAKER INSERTION N/A 06/07/2011   Procedure: PERMANENT PACEMAKER INSERTION;  Surgeon: Evans Lance, MD;  Location: [facility] CATH LAB;  Service: Cardiovascular;  Laterality: N/A;  . THYROIDECTOMY    . TONSILLECTOMY    . vertebroplasty      SOCIAL HISTORY: Social History   Social History  . Marital status: Widowed    Spouse name: N/A  . Number of children: N/A  .  Years of education: N/A   Occupational History  . Not on file.   Social History Main Topics  . Smoking status: Never Smoker  . Smokeless tobacco: Never Used  . Alcohol use No  . Drug use: No  . Sexual activity: No   Other Topics Concern  . Not on file   Social History Narrative  . No narrative on file    FAMILY HISTORY: Family History  Problem Relation Age of Onset  . Heart attack Mother     Deceased  . Cancer Father     Deceased, colon cancer age 46  . Cancer Brother     Deceased,  throat and lung    ALLERGIES:  is allergic to tramadol and sulfa antibiotics.  MEDICATIONS:  Current Outpatient Prescriptions  Medication Sig Dispense Refill  . acetaminophen (TYLENOL) 500 MG tablet Take 1,000 mg by mouth every 6 (six) hours as needed for mild pain.     Marland Kitchen ALPRAZolam (XANAX) 0.25 MG tablet Take 0.25 mg by mouth at bedtime as needed for sleep.    Marland Kitchen amLODipine (NORVASC) 5 MG tablet Take 5 mg by mouth at bedtime.     Marland Kitchen aspirin 325 MG tablet Take 81 mg by mouth daily.     . Calcium Carbonate Antacid (CAL-GEST ANTACID PO) Take by mouth.    . carvedilol (COREG) 6.25 MG tablet Take 1 tablet (6.25 mg total) by mouth 2 (two) times daily with a meal. 60 tablet 0  . cycloSPORINE (RESTASIS) 0.05 % ophthalmic emulsion Place 1 drop into both eyes 2 (two) times daily. *Self Administration*    . diphenhydrAMINE (BENADRYL) 25 mg capsule Take 1 capsule (25 mg total) by mouth every 6 (six) hours as needed. Start at the first sign/symptom of rash 30 capsule 1  . diphenhydrAMINE (BENYLIN) 12.5 MG/5ML syrup Take 5 mLs (12.5 mg total) by mouth 4 (four) times daily as needed for itching or allergies. 120 mL 0  . docusate sodium (COLACE) 100 MG capsule Take 100 mg by mouth at bedtime. For constipation    . Ferrous Sulfate Dried (FERROUS SULFATE CR PO) Take by mouth.    . fluticasone (FLONASE) 50 MCG/ACT nasal spray Place 2 sprays into the nose daily. For allergies    . hydrALAZINE (APRESOLINE) 10 MG tablet Take 10 mg by mouth 2 (two) times daily.    Marland Kitchen HYDROcodone-acetaminophen (NORCO) 10-325 MG tablet Take by mouth every 6 (six) hours as needed for pain.    . hydrocortisone (ANUSOL-HC) 2.5 % rectal cream Place 1 application rectally 2 (two) times daily.    . hydrocortisone cream 0.5 % Apply 1 application topically 2 (two) times daily. 30 g 1  . Hypertonic Nasal Wash (SINUS RINSE NA) Place 1 application into both nostrils daily.    Marland Kitchen lenalidomide (REVLIMID) 10 MG capsule Take 1 capsule (10 mg total)  by mouth daily. 28 capsule 0  . levothyroxine (SYNTHROID, LEVOTHROID) 75 MCG tablet Take 75 mcg by mouth daily before breakfast.     . loperamide (IMODIUM) 2 MG capsule Take 2 mg by mouth every 4 (four) hours as needed for diarrhea or loose stools.    Marland Kitchen loratadine (CLARITIN) 10 MG tablet Take 10 mg by mouth daily.    . magnesium hydroxide (MILK OF MAGNESIA) 400 MG/5ML suspension Take 0.5 mLs by mouth as needed for mild constipation.    . metaxalone (SKELAXIN) 800 MG tablet Take 1 tablet (800 mg total) by mouth 3 (three) times daily. 30 tablet 1  .  methylPREDNISolone (MEDROL DOSEPAK) 4 MG TBPK tablet Take as prescribed 21 tablet 0  . multivitamin-lutein (OCUVITE-LUTEIN) CAPS capsule Take 1 capsule by mouth 2 (two) times daily.    Marland Kitchen omeprazole (PRILOSEC) 20 MG capsule Take 20 mg by mouth daily. Reported on 06/25/2015    . polyethylene glycol (MIRALAX / GLYCOLAX) packet Take 17 g by mouth daily.    . predniSONE (DELTASONE) 5 MG tablet Take 5 mg by mouth daily with breakfast.    . sertraline (ZOLOFT) 50 MG tablet Take 50 mg by mouth daily.     No current facility-administered medications for this visit.    REVIEW OF SYSTEMS:   Review of Systems  Constitutional: Negative.        Feels real bad  Eyes: Negative.   Respiratory: Negative.   Cardiovascular: Negative.   Gastrointestinal: Negative.   Genitourinary: Negative.   Musculoskeletal: Positive for back pain (due to her previous compression fractures).  Skin: Negative.   Neurological: Positive for headaches (Thinks it is a sinus headache).  Endo/Heme/Allergies: Negative.   Psychiatric/Behavioral: Negative.   All other systems reviewed and are negative. 14 point review of systems was performed and is negative except as detailed under history of present illness and above   PHYSICAL EXAMINATION: ECOG PERFORMANCE STATUS: 3 - Symptomatic, >50% confined to bed  Vitals with BMI 05/03/2016  Height   Weight 135 lbs 6 oz  BMI   Systolic 876    Diastolic 50  Pulse 60  Respirations 16   Physical Exam  Constitutional: She is oriented to person, place, and time and well-developed, well-nourished, and in no distress.  HENT:  Head: Normocephalic and atraumatic.  Eyes: EOM are normal. Pupils are equal, round, and reactive to light. No scleral icterus.  Neck: Normal range of motion. Neck supple.  Cardiovascular: Normal rate, regular rhythm and normal heart sounds.   Pulmonary/Chest: Effort normal and breath sounds normal.  Abdominal: Soft. Bowel sounds are normal. She exhibits no distension and no mass. There is no tenderness. There is no rebound and no guarding.  Musculoskeletal: Normal range of motion.  Lymphadenopathy:    She has no cervical adenopathy.  Neurological: She is alert and oriented to person, place, and time. No cranial nerve deficit.  Uses walker  Skin: Skin is warm and dry.  Psychiatric: Mood, memory, affect and judgment normal.  Nursing note and vitals reviewed.   LABORATORY DATA:  I have reviewed the data as listed  . CBC Latest Ref Rng & Units 04/26/2016 04/09/2016 03/27/2016  WBC 4.0 - 10.5 K/uL 7.2 7.8 5.4  Hemoglobin 12.0 - 15.0 g/dL 10.0(L) 9.7(L) 10.3(L)  Hematocrit 36.0 - 46.0 % 30.5(L) 29.2(L) 31.2(L)  Platelets 150 - 400 K/uL 269 260 276    . CMP Latest Ref Rng & Units 04/26/2016 04/09/2016 03/27/2016  Glucose 65 - 99 mg/dL 98 156(H) 108(H)  BUN 6 - 20 mg/dL 14 17 20   Creatinine 0.44 - 1.00 mg/dL 1.11(H) 1.19(H) 0.95  Sodium 135 - 145 mmol/L 134(L) 133(L) 135  Potassium 3.5 - 5.1 mmol/L 3.8 3.7 3.7  Chloride 101 - 111 mmol/L 98(L) 98(L) 97(L)  CO2 22 - 32 mmol/L 29 27 31   Calcium 8.9 - 10.3 mg/dL 9.1 8.8(L) 8.8(L)  Total Protein 6.5 - 8.1 g/dL 6.1(L) 5.7(L) 6.0(L)  Total Bilirubin 0.3 - 1.2 mg/dL 0.4 0.6 0.4  Alkaline Phos 38 - 126 U/L 51 48 45  AST 15 - 41 U/L 15 17 15   ALT 14 - 54 U/L 12(L) 12(L)  10(L)   Results for KIAH, VANALSTINE (MRN [id number])   Ref. Range 12/14/2015 10:57 12/14/2015  10:57  Iron Latest Ref Range: 28 - 170 ug/dL 75   UIBC Latest Units: ug/dL 195   TIBC Latest Ref Range: 250 - 450 ug/dL 270   Saturation Ratios Latest Ref Range: 10.4 - 31.8 % 28   Ferritin Latest Ref Range: 11 - 307 ng/mL 208   Folate, Hemolysate Latest Ref Range: Not Estab. ng/mL  608.7  Folate, RBC Latest Ref Range: >498 ng/mL  1,920  Vitamin B12 Latest Ref Range: 180 - 914 pg/mL 577    Results for FLORITA, NITSCH (MRN [id number]) as of 05/03/2016 18:12  Ref. Range 12/14/2015 10:57 01/11/2016 13:36 04/09/2016 12:39  LDH Latest Ref Range: 98 - 192 U/L 167 194 (H) 182    Results for TEOFILA, BOWERY (MRN [id number]) as of 05/03/2016 18:12  Ref. Range 04/09/2016 12:39  Kappa free light chain Latest Ref Range: 3.3 - 19.4 mg/L 221.7 (H)  Lamda free light chains Latest Ref Range: 5.7 - 26.3 mg/L 16.5  Kappa, lamda light chain ratio Latest Ref Range: 0.26 - 1.65  13.44 (H)       RADIOGRAPHIC STUDIES: I have personally reviewed the radiological images as listed and agreed with the findings in the report. Ct Cervical Spine Wo Contrast  Result Date: 04/10/2016 CLINICAL DATA:  Multiple myeloma not having achieved remission. Back pain EXAM: CT CERVICAL SPINE WITHOUT CONTRAST TECHNIQUE: Multidetector CT imaging of the cervical spine was performed without intravenous contrast. Multiplanar CT image reconstructions were also generated. COMPARISON:  None. FINDINGS: Alignment: 2.5 mm anterolisthesis C4-5. 4 mm anterolisthesis at C5-6. Remaining alignment normal. Skull base and vertebrae: Negative for cervical spine fracture. No lytic lesions are seen consistent with myeloma. 1 cm fat containing lesion T2 vertebral body on the right consistent with hemangioma. Soft tissues and spinal canal: Negative for mass or adenopathy. Carotid artery calcification. Disc levels: C2-3: Disc degeneration with disc calcification. No significant stenosis bilateral facet fusion, left greater than right. C3-4: Moderate facet  degeneration on the left. Mild uncinate spurring on the left. No significant spinal or foraminal encroachment. Left-sided facet fusion. C4-5: 2.5 mm anterolisthesis with moderate to advanced facet degeneration bilaterally. No significant spinal stenosis. Mild foraminal narrowing bilaterally C5-6: 4 mm anterolisthesis. Moderate to advanced facet degeneration on the left. Mild degeneration of the right facet. No significant spinal stenosis C6-7:  Moderate disc degeneration without stenosis. C7-T1:  Negative Upper chest: Negative Other: None IMPRESSION: Cervical spine degenerative changes as above. Negative for fracture or mass. No evidence of myeloma in the cervical spine. Electronically Signed   By: Franchot Gallo M.D.   On: 04/10/2016 09:15   Ct Thoracic Spine Wo Contrast  Result Date: 04/10/2016 CLINICAL DATA:  Multiple myeloma.  Acute midline back pain EXAM: CT THORACIC SPINE WITHOUT CONTRAST TECHNIQUE: Multidetector CT images of the thoracic were obtained using the standard protocol without intravenous contrast. COMPARISON:  Thoracic spine x-rays 03/27/2016.  CT chest 06/30/2015 FINDINGS: Alignment: Mild thoracic levoscoliosis. Normal sagittal alignment. Mild to moderate thoracic kyphosis at T7. Vertebrae: Moderate fracture of T6 with prior vertebroplasty cement similar to the prior study. Mild fracture of T7 with vertebroplasty cement in satisfactory position. Moderate compression fracture of T8 with vertebral vertebroplasty cement in satisfactory position bilaterally. Mild loss of height of T9, T10, T11 unchanged from 06/30/2015. No acute fracture. No lytic lesions are identified in the thoracic spine. Paraspinal and other soft tissues: Dual lead cardiac pacemaker.  Diffuse atherosclerotic disease in the aorta. Small right pleural effusion. Mild dependent atelectasis in the lung bases bilaterally. Disc levels: Disc degeneration and mild spurring in the lower thoracic spine from T8 through T12. No  significant spinal stenosis. No large disc protrusion. IMPRESSION: Negative for thoracic mass lesion Multiple chronic thoracic compression fractures. No acute fracture. Prior vertebroplasty of T6, T7, and T8 vertebral bodies. Electronically Signed   By: Franchot Gallo M.D.   On: 04/10/2016 09:27   Ct Lumbar Spine Wo Contrast  Result Date: 04/10/2016 CLINICAL DATA:  Multiple myeloma.  Acute midline back pain EXAM: CT LUMBAR SPINE WITHOUT CONTRAST TECHNIQUE: Multidetector CT imaging of the lumbar spine was performed without intravenous contrast administration. Multiplanar CT image reconstructions were also generated. COMPARISON:  Lumbar spine radiographs 03/27/2016. CT lumbar spine 09/08/2012 FINDINGS: Segmentation: Normal Alignment: Normal Vertebrae: Negative for fracture or mass. No lytic lesions are seen consistent with multiple myeloma. Generalized osteopenia. Paraspinal and other soft tissues: Atherosclerotic calcification. No aortic aneurysm. Negative for retroperitoneal adenopathy. Disc levels: T12-L1: Disc degeneration with mild spurring and vacuum disc phenomena. Negative for spinal stenosis L1-2: Disc degeneration with gas in the disc space. Mild disc bulging without significant stenosis. Mild facet degeneration. L2-3: Advanced disc degeneration with disc space narrowing. Endplate spurring and gas in the disc space. Mild facet hypertrophy and mild spinal stenosis. Mild left foraminal encroachment due to spurring C3-4: Advanced disc degeneration with disc space narrowing and gas in the disc space. Endplate spurring and mild facet degeneration contribute to mild spinal stenosis. Neural foramina adequately patent bilaterally L4-5: Moderate to advanced disc degeneration with disc space narrowing and diffuse endplate spurring. Mild facet hypertrophy. No significant spinal or foraminal encroachment L5-S1: Disc degeneration with diffuse endplate spurring. Central osteophyte touching the thecal sac. No  significant neural impingement or stenosis. IMPRESSION: Negative for lumbar spine fracture. Negative for lytic lesion of multiple myeloma in the lumbar spine Advanced lumbar degenerative changes as above. Electronically Signed   By: Franchot Gallo M.D.   On: 04/10/2016 09:21    ASSESSMENT & PLAN:  IgA kappa myeloma Normocytic anemia HX iron deficiency HX IV iron replacement Polymyalgia rheumatica   83 year old female with multiple medical comorbidities currently residing in assisted living at [facility] diagnosed with multiple myeloma Intolerant of dexamethasone secondary to headaches. Intolerant to revlimid secondary to severe back spasms.  I am concerned about treatment options for [facility], I certainly do not want to make her feel worse. We discussed weekly velcade.  I talked to her about changing to Velcade. I will talk to [facility] and get her set up for Velcade. She will need acyclovir prophylaxis.   I am going to stop her Iron. I will make sure they will stop her muscle relaxant. I talked to her about potentially starting her on XGEVA. It now has FDA approval in [place].    I told her to start taking her flonase for her sinuses again. She knows to let us if it doesn't get better.    I want her diet supplemented twice a day with Boost. She likes the vanilla flavor.   I told her to see Dr. Rolena Infante for her back pain.   She will follow up on 05/18/16  All of the patients questions were answered with apparent satisfaction. The patient knows to call the clinic with any problems, questions or concerns.  This document serves as a record of services personally performed by Ancil Linsey, MD. It was created on her behalf by Shirlean Mylar, a  trained medical scribe. The creation of this record is based on the scribe's personal observations and the provider's statements to them. This document has been checked and approved by the attending provider.   I have reviewed the above documentation for  accuracy and completeness and I agree with the above.  Kelby Fam. Quantavia Frith, MD 05/03/2016 1:21 PM

## 2016-05-03 NOTE — Patient Instructions (Signed)
[facility] at [facility] Discharge Instructions  RECOMMENDATIONS MADE BY THE CONSULTANT AND ANY TEST RESULTS WILL BE SENT TO YOUR REFERRING PHYSICIAN.  You were seen today by Dr. Youlanda Roys will begin Velcade next week and continue weekly, Shellia Carwin, Rn will teach you about this drug.  We will get you an appointment with Dr. Rolena Infante Follow up in 2 weeks.  Thank you for choosing [facility] at [facility] to provide your oncology and hematology care.  To afford each patient quality time with our provider, please arrive at least 15 minutes before your scheduled appointment time.    If you have a lab appointment with the [facility] please come in thru the  Main Entrance and check in at the main information desk  You need to re-schedule your appointment should you arrive 10 or more minutes late.  We strive to give you quality time with our providers, and arriving late affects you and other patients whose appointments are after yours.  Also, if you no show three or more times for appointments you may be dismissed from the clinic at the providers discretion.     Again, thank you for choosing [facility].  Our hope is that these requests will decrease the amount of time that you wait before being seen by our physicians.       _____________________________________________________________  Should you have questions after your visit to [facility], please contact our office at [phone] between the hours of 8:30 a.m. and 4:30 p.m.  Voicemails left after 4:30 p.m. will not be returned until the following business day.  For prescription refill requests, have your pharmacy contact our office.       Resources For Cancer Patients and their Caregivers ? American Cancer Society: Can assist with transportation, wigs, general needs, runs Look Good Feel Better.        901-414-4148 ? Cancer Care: Provides financial  assistance, online support groups, medication/co-pay assistance.  1-800-813-HOPE 207-855-6468) ? [facility] Assists [place] cancer patients and their families through emotional , educational and financial support.  6844660789 ? [place] DSS Where to apply for food stamps, Medicaid and utility assistance. 325-125-5263 ? [facility]: Transportation to medical appointments. 518 229 0084 ? Social Security Administration: May apply for disability if have a Stage IV cancer. 979-392-3312 240 329 8591 ? [employer], Disability and Transit Services: Assists with nutrition, care and transit needs. [place] Support Programs: @10RELATIVEDAYS @ > Cancer Support Group  2nd Tuesday of the month 1pm-2pm, Journey Room  > Creative Journey  3rd Tuesday of the month 1130am-1pm, Journey Room  > Look Good Feel Better  1st Wednesday of the month 10am-12 noon, Journey Room (Call [facility] to register [phone])

## 2016-05-04 ENCOUNTER — Telehealth (HOSPITAL_COMMUNITY): Payer: Self-pay | Admitting: Emergency Medicine

## 2016-05-04 ENCOUNTER — Other Ambulatory Visit (HOSPITAL_COMMUNITY): Payer: Self-pay | Admitting: *Deleted

## 2016-05-04 DIAGNOSIS — C9000 Multiple myeloma not having achieved remission: Secondary | ICD-10-CM

## 2016-05-04 NOTE — Telephone Encounter (Signed)
Clarification order sent to [facility] for flonase to say 2 sprays daily as needed.

## 2016-05-07 ENCOUNTER — Encounter (HOSPITAL_BASED_OUTPATIENT_CLINIC_OR_DEPARTMENT_OTHER): Payer: Medicare Other

## 2016-05-07 ENCOUNTER — Encounter (HOSPITAL_COMMUNITY): Payer: Self-pay

## 2016-05-07 ENCOUNTER — Encounter (HOSPITAL_COMMUNITY): Payer: Medicare Other

## 2016-05-07 ENCOUNTER — Other Ambulatory Visit (HOSPITAL_COMMUNITY): Payer: Self-pay | Admitting: Hematology & Oncology

## 2016-05-07 DIAGNOSIS — C9000 Multiple myeloma not having achieved remission: Secondary | ICD-10-CM

## 2016-05-07 DIAGNOSIS — Z5189 Encounter for other specified aftercare: Secondary | ICD-10-CM | POA: Diagnosis not present

## 2016-05-07 DIAGNOSIS — Z9221 Personal history of antineoplastic chemotherapy: Secondary | ICD-10-CM | POA: Diagnosis not present

## 2016-05-07 DIAGNOSIS — Z9889 Other specified postprocedural states: Secondary | ICD-10-CM | POA: Diagnosis not present

## 2016-05-07 DIAGNOSIS — Z5112 Encounter for antineoplastic immunotherapy: Secondary | ICD-10-CM

## 2016-05-07 LAB — CBC WITH DIFFERENTIAL/PLATELET
BASOS PCT: 4 %
Basophils Absolute: 0.2 10*3/uL — ABNORMAL HIGH (ref 0.0–0.1)
EOS ABS: 0.1 10*3/uL (ref 0.0–0.7)
EOS PCT: 1 %
HEMATOCRIT: 29.6 % — AB (ref 36.0–46.0)
Hemoglobin: 10 g/dL — ABNORMAL LOW (ref 12.0–15.0)
Lymphocytes Relative: 19 %
Lymphs Abs: 0.9 10*3/uL (ref 0.7–4.0)
MCH: 33 pg (ref 26.0–34.0)
MCHC: 33.8 g/dL (ref 30.0–36.0)
MCV: 97.7 fL (ref 78.0–100.0)
MONO ABS: 0.8 10*3/uL (ref 0.1–1.0)
MONOS PCT: 18 %
NEUTROS ABS: 2.7 10*3/uL (ref 1.7–7.7)
Neutrophils Relative %: 58 %
Platelets: 269 10*3/uL (ref 150–400)
RBC: 3.03 MIL/uL — ABNORMAL LOW (ref 3.87–5.11)
RDW: 18 % — AB (ref 11.5–15.5)
WBC: 4.7 10*3/uL (ref 4.0–10.5)

## 2016-05-07 LAB — COMPREHENSIVE METABOLIC PANEL
ALBUMIN: 3.7 g/dL (ref 3.5–5.0)
ALT: 11 U/L — ABNORMAL LOW (ref 14–54)
ANION GAP: 7 (ref 5–15)
AST: 17 U/L (ref 15–41)
Alkaline Phosphatase: 51 U/L (ref 38–126)
BILIRUBIN TOTAL: 0.5 mg/dL (ref 0.3–1.2)
BUN: 19 mg/dL (ref 6–20)
CO2: 27 mmol/L (ref 22–32)
Calcium: 8.8 mg/dL — ABNORMAL LOW (ref 8.9–10.3)
Chloride: 99 mmol/L — ABNORMAL LOW (ref 101–111)
Creatinine, Ser: 0.85 mg/dL (ref 0.44–1.00)
GFR calc Af Amer: >60 mL/min (ref >60)
GFR calc non Af Amer: 58 mL/min — ABNORMAL LOW (ref >60)
GLUCOSE: 111 mg/dL — AB (ref 65–99)
POTASSIUM: 3.9 mmol/L (ref 3.5–5.1)
Sodium: 133 mmol/L — ABNORMAL LOW (ref 135–145)
Total Protein: 5.9 g/dL — ABNORMAL LOW (ref 6.5–8.1)

## 2016-05-07 MED ORDER — BORTEZOMIB CHEMO SQ INJECTION 3.5 MG (2.5MG/ML)
1.3000 mg/m2 | Freq: Once | INTRAMUSCULAR | Status: AC
  Administered 2016-05-07: 2.25 mg via SUBCUTANEOUS
  Filled 2016-05-07: qty 2.25

## 2016-05-07 MED ORDER — PROCHLORPERAZINE MALEATE 10 MG PO TABS
10.0000 mg | ORAL_TABLET | Freq: Once | ORAL | Status: AC
  Administered 2016-05-07: 10 mg via ORAL
  Filled 2016-05-07: qty 1

## 2016-05-07 NOTE — Patient Instructions (Signed)
[facility] Discharge Instructions for Patients Receiving Chemotherapy   Beginning January 23rd 2017 lab work for the Cancer Center will be done in the  Main lab at [facility] on 1st floor. If you have a lab appointment with the Cancer Center please come in thru the  Main Entrance and check in at the main information desk   Today you received the following chemotherapy agents   To help prevent nausea and vomiting after your treatment, we encourage you to take your nausea medication     If you develop nausea and vomiting, or diarrhea that is not controlled by your medication, call the clinic.  The clinic phone number is [phone]. Office hours are Monday-Friday 8:30am-5:00pm.  BELOW ARE SYMPTOMS THAT SHOULD BE REPORTED IMMEDIATELY:  *FEVER GREATER THAN 101.0 F  *CHILLS WITH OR WITHOUT FEVER  NAUSEA AND VOMITING THAT IS NOT CONTROLLED WITH YOUR NAUSEA MEDICATION  *UNUSUAL SHORTNESS OF BREATH  *UNUSUAL BRUISING OR BLEEDING  TENDERNESS IN MOUTH AND THROAT WITH OR WITHOUT PRESENCE OF ULCERS  *URINARY PROBLEMS  *BOWEL PROBLEMS  UNUSUAL RASH Items with * indicate a potential emergency and should be followed up as soon as possible. If you have an emergency after office hours please contact your primary care physician or go to the nearest emergency department.  Please call the clinic during office hours if you have any questions or concerns.   You may also contact the Patient Navigator at [phone] should you have any questions or need assistance in obtaining follow up care.      Resources For Cancer Patients and their Caregivers ? American Cancer Society: Can assist with transportation, wigs, general needs, runs Look Good Feel Better.        1-888-227-6333 ? Cancer Care: Provides financial assistance, online support groups, medication/co-pay assistance.  1-800-813-HOPE (4673) ? [facility] Assists [place] cancer  patients and their families through emotional , educational and financial support.  336-427-4357 ? [place] DSS Where to apply for food stamps, Medicaid and utility assistance. 336-342-1394 ? [facility]: Transportation to medical appointments. 336-347-2287 ? Social Security Administration: May apply for disability if have a Stage IV cancer. 336-342-7796 1-800-772-1213 ? [place] Aging, Disability and Transit Services: Assists with nutrition, care and transit needs. 336-349-2343         

## 2016-05-07 NOTE — Progress Notes (Signed)
Chemotherapy given today. Consent done for velcade. Teaching packet reviewed and given to patient. Vitals stable and discharged home via wheelchair with son.follow up as scheduled.

## 2016-05-07 NOTE — Progress Notes (Signed)
START ON PATHWAY REGIMEN - Multiple Myeloma  [id number]: Vd (Bortezomib 1.3 mg/m2 Subcut D1, 4, 8, 11 + Dexamethasone 20 mg) q21 Days x 8 Cycles   A cycle is every 21 days:     Bortezomib (Velcade(R)) 1.3 mg/m2 subcut twice weekly on days 1,4,8, and 11 Dose Mod: None     Dexamethasone (Decadron(R)) 20 mg orally days 1,2,4,5,8,9,11 and 12 Dose Mod: None Additional Orders: Herpes zoster prophylaxis recommended.  **Always confirm dose/schedule in your pharmacy ordering system**    Patient Characteristics: Newly Diagnosed, Transplant Ineligible or Refused, Standard Risk R-ISS Staging: II Disease Classification: Newly Diagnosed Is Patient Eligible for Transplant? Transplant Ineligible or Refused Risk Status: Standard Risk  Intent of Therapy: Non-Curative / Palliative Intent, Discussed with Patient

## 2016-05-08 ENCOUNTER — Telehealth (HOSPITAL_COMMUNITY): Payer: Self-pay | Admitting: *Deleted

## 2016-05-08 DIAGNOSIS — M4004 Postural kyphosis, thoracic region: Secondary | ICD-10-CM | POA: Diagnosis not present

## 2016-05-08 DIAGNOSIS — C9000 Multiple myeloma not having achieved remission: Secondary | ICD-10-CM | POA: Diagnosis not present

## 2016-05-08 DIAGNOSIS — M4312 Spondylolisthesis, cervical region: Secondary | ICD-10-CM | POA: Diagnosis not present

## 2016-05-08 DIAGNOSIS — M353 Polymyalgia rheumatica: Secondary | ICD-10-CM | POA: Diagnosis not present

## 2016-05-08 DIAGNOSIS — M8008XD Age-related osteoporosis with current pathological fracture, vertebra(e), subsequent encounter for fracture with routine healing: Secondary | ICD-10-CM | POA: Diagnosis not present

## 2016-05-08 DIAGNOSIS — M4184 Other forms of scoliosis, thoracic region: Secondary | ICD-10-CM | POA: Diagnosis not present

## 2016-05-08 DIAGNOSIS — R110 Nausea: Secondary | ICD-10-CM

## 2016-05-08 MED ORDER — PROCHLORPERAZINE MALEATE 10 MG PO TABS: 10.0000 mg | ORAL_TABLET | Freq: Four times a day (QID) | ORAL | 1 refills | Status: DC | PRN

## 2016-05-08 MED ORDER — ONDANSETRON HCL 8 MG PO TABS: 8.0000 mg | ORAL_TABLET | Freq: Three times a day (TID) | ORAL | 1 refills | Status: DC | PRN

## 2016-05-08 NOTE — Telephone Encounter (Signed)
Contacted for 24 hr f/u post first Velcade injection.  Reports nausea and decreased appetite, denies vomiting.  Zofran and Compazine e-scribed to pt's pharmacy per verbal order from T. Kefalas, PA-C. Pt aware that medications were e-scribed.

## 2016-05-11 ENCOUNTER — Telehealth (HOSPITAL_COMMUNITY): Payer: Self-pay | Admitting: Oncology

## 2016-05-11 DIAGNOSIS — M546 Pain in thoracic spine: Secondary | ICD-10-CM | POA: Diagnosis not present

## 2016-05-11 DIAGNOSIS — C9000 Multiple myeloma not having achieved remission: Secondary | ICD-10-CM | POA: Diagnosis not present

## 2016-05-11 DIAGNOSIS — M4004 Postural kyphosis, thoracic region: Secondary | ICD-10-CM | POA: Diagnosis not present

## 2016-05-11 DIAGNOSIS — M4312 Spondylolisthesis, cervical region: Secondary | ICD-10-CM | POA: Diagnosis not present

## 2016-05-11 DIAGNOSIS — M353 Polymyalgia rheumatica: Secondary | ICD-10-CM | POA: Diagnosis not present

## 2016-05-11 DIAGNOSIS — M4184 Other forms of scoliosis, thoracic region: Secondary | ICD-10-CM | POA: Diagnosis not present

## 2016-05-11 DIAGNOSIS — M8008XD Age-related osteoporosis with current pathological fracture, vertebra(e), subsequent encounter for fracture with routine healing: Secondary | ICD-10-CM | POA: Diagnosis not present

## 2016-05-11 NOTE — Telephone Encounter (Signed)
Patient seen by Dr. Rolena Infante ([facility]) today for back pain.  He recommends aquatic PT which he will arrange.  He recommended bisphosphonate therapy which may be helpful too.  He will send a copy of his report when completed.  KEFALAS,THOMAS, PA-C 05/11/2016 1:42 PM

## 2016-05-14 ENCOUNTER — Encounter (HOSPITAL_BASED_OUTPATIENT_CLINIC_OR_DEPARTMENT_OTHER): Payer: Medicare Other

## 2016-05-14 ENCOUNTER — Encounter (HOSPITAL_COMMUNITY): Payer: Medicare Other

## 2016-05-14 ENCOUNTER — Other Ambulatory Visit (HOSPITAL_COMMUNITY): Payer: Self-pay | Admitting: Oncology

## 2016-05-14 ENCOUNTER — Encounter (HOSPITAL_COMMUNITY): Payer: Self-pay

## 2016-05-14 VITALS — BP 160/68 | HR 64 | Temp 98.3°F | Resp 18 | Wt 136.5 lb

## 2016-05-14 DIAGNOSIS — Z5189 Encounter for other specified aftercare: Secondary | ICD-10-CM | POA: Diagnosis not present

## 2016-05-14 DIAGNOSIS — Z9221 Personal history of antineoplastic chemotherapy: Secondary | ICD-10-CM | POA: Diagnosis not present

## 2016-05-14 DIAGNOSIS — Z9889 Other specified postprocedural states: Secondary | ICD-10-CM | POA: Diagnosis not present

## 2016-05-14 DIAGNOSIS — Z5112 Encounter for antineoplastic immunotherapy: Secondary | ICD-10-CM

## 2016-05-14 DIAGNOSIS — C9000 Multiple myeloma not having achieved remission: Secondary | ICD-10-CM

## 2016-05-14 LAB — COMPREHENSIVE METABOLIC PANEL
ALBUMIN: 3.7 g/dL (ref 3.5–5.0)
ALT: 9 U/L — AB (ref 14–54)
AST: 15 U/L (ref 15–41)
Alkaline Phosphatase: 49 U/L (ref 38–126)
Anion gap: 7 (ref 5–15)
BUN: 22 mg/dL — AB (ref 6–20)
CHLORIDE: 98 mmol/L — AB (ref 101–111)
CO2: 30 mmol/L (ref 22–32)
CREATININE: 1.22 mg/dL — AB (ref 0.44–1.00)
Calcium: 8.9 mg/dL (ref 8.9–10.3)
GFR calc Af Amer: 44 mL/min — ABNORMAL LOW (ref >60)
GFR calc non Af Amer: 38 mL/min — ABNORMAL LOW (ref >60)
GLUCOSE: 128 mg/dL — AB (ref 65–99)
Potassium: 4.3 mmol/L (ref 3.5–5.1)
SODIUM: 135 mmol/L (ref 135–145)
Total Bilirubin: 0.4 mg/dL (ref 0.3–1.2)
Total Protein: 5.7 g/dL — ABNORMAL LOW (ref 6.5–8.1)

## 2016-05-14 LAB — CBC WITH DIFFERENTIAL/PLATELET
Basophils Absolute: 0.1 10*3/uL (ref 0.0–0.1)
Basophils Relative: 1 %
EOS ABS: 0 10*3/uL (ref 0.0–0.7)
EOS PCT: 0 %
HCT: 30.8 % — ABNORMAL LOW (ref 36.0–46.0)
Hemoglobin: 10.1 g/dL — ABNORMAL LOW (ref 12.0–15.0)
LYMPHS ABS: 0.9 10*3/uL (ref 0.7–4.0)
LYMPHS PCT: 15 %
MCH: 32.5 pg (ref 26.0–34.0)
MCHC: 32.8 g/dL (ref 30.0–36.0)
MCV: 99 fL (ref 78.0–100.0)
MONOS PCT: 7 %
Monocytes Absolute: 0.4 10*3/uL (ref 0.1–1.0)
Neutro Abs: 4.5 10*3/uL (ref 1.7–7.7)
Neutrophils Relative %: 77 %
PLATELETS: 263 10*3/uL (ref 150–400)
RBC: 3.11 MIL/uL — ABNORMAL LOW (ref 3.87–5.11)
RDW: 18 % — ABNORMAL HIGH (ref 11.5–15.5)
WBC: 5.9 10*3/uL (ref 4.0–10.5)

## 2016-05-14 MED ORDER — PROCHLORPERAZINE MALEATE 10 MG PO TABS
ORAL_TABLET | ORAL | Status: AC
  Filled 2016-05-14: qty 1

## 2016-05-14 MED ORDER — BORTEZOMIB CHEMO SQ INJECTION 3.5 MG (2.5MG/ML)
1.3000 mg/m2 | Freq: Once | INTRAMUSCULAR | Status: AC
  Administered 2016-05-14: 2.25 mg via SUBCUTANEOUS
  Filled 2016-05-14: qty 2.25

## 2016-05-14 MED ORDER — PROCHLORPERAZINE MALEATE 10 MG PO TABS
10.0000 mg | ORAL_TABLET | Freq: Once | ORAL | Status: AC
  Administered 2016-05-14: 10 mg via ORAL

## 2016-05-14 NOTE — Patient Instructions (Signed)
[facility] Discharge Instructions for Patients Receiving Chemotherapy   Beginning January 23rd 2017 lab work for the Cancer Center will be done in the  Main lab at [facility] on 1st floor. If you have a lab appointment with the Cancer Center please come in thru the  Main Entrance and check in at the main information desk   Today you received the following chemotherapy agents   To help prevent nausea and vomiting after your treatment, we encourage you to take your nausea medication     If you develop nausea and vomiting, or diarrhea that is not controlled by your medication, call the clinic.  The clinic phone number is [phone]. Office hours are Monday-Friday 8:30am-5:00pm.  BELOW ARE SYMPTOMS THAT SHOULD BE REPORTED IMMEDIATELY:  *FEVER GREATER THAN 101.0 F  *CHILLS WITH OR WITHOUT FEVER  NAUSEA AND VOMITING THAT IS NOT CONTROLLED WITH YOUR NAUSEA MEDICATION  *UNUSUAL SHORTNESS OF BREATH  *UNUSUAL BRUISING OR BLEEDING  TENDERNESS IN MOUTH AND THROAT WITH OR WITHOUT PRESENCE OF ULCERS  *URINARY PROBLEMS  *BOWEL PROBLEMS  UNUSUAL RASH Items with * indicate a potential emergency and should be followed up as soon as possible. If you have an emergency after office hours please contact your primary care physician or go to the nearest emergency department.  Please call the clinic during office hours if you have any questions or concerns.   You may also contact the Patient Navigator at [phone] should you have any questions or need assistance in obtaining follow up care.      Resources For Cancer Patients and their Caregivers ? American Cancer Society: Can assist with transportation, wigs, general needs, runs Look Good Feel Better.        1-888-227-6333 ? Cancer Care: Provides financial assistance, online support groups, medication/co-pay assistance.  1-800-813-HOPE (4673) ? [facility] Assists [place] cancer  patients and their families through emotional , educational and financial support.  336-427-4357 ? [place] DSS Where to apply for food stamps, Medicaid and utility assistance. 336-342-1394 ? RCATS: Transportation to medical appointments. 336-347-2287 ? Social Security Administration: May apply for disability if have a Stage IV cancer. 336-342-7796 1-800-772-1213 ? [place] Aging, Disability and Transit Services: Assists with nutrition, care and transit needs. 336-349-2343         

## 2016-05-14 NOTE — Progress Notes (Signed)
velcade given today per orders. Patient tolerated it well, no issues with injection site. Vitals stable and discharged from clinic ambulatory with walker and son at her side.follow up as scheduled.

## 2016-05-15 DIAGNOSIS — M4004 Postural kyphosis, thoracic region: Secondary | ICD-10-CM | POA: Diagnosis not present

## 2016-05-15 DIAGNOSIS — M4312 Spondylolisthesis, cervical region: Secondary | ICD-10-CM | POA: Diagnosis not present

## 2016-05-15 DIAGNOSIS — M4184 Other forms of scoliosis, thoracic region: Secondary | ICD-10-CM | POA: Diagnosis not present

## 2016-05-15 DIAGNOSIS — M8008XD Age-related osteoporosis with current pathological fracture, vertebra(e), subsequent encounter for fracture with routine healing: Secondary | ICD-10-CM | POA: Diagnosis not present

## 2016-05-15 DIAGNOSIS — M353 Polymyalgia rheumatica: Secondary | ICD-10-CM | POA: Diagnosis not present

## 2016-05-15 DIAGNOSIS — C9000 Multiple myeloma not having achieved remission: Secondary | ICD-10-CM | POA: Diagnosis not present

## 2016-05-16 DIAGNOSIS — R002 Palpitations: Secondary | ICD-10-CM | POA: Diagnosis not present

## 2016-05-16 DIAGNOSIS — I10 Essential (primary) hypertension: Secondary | ICD-10-CM | POA: Diagnosis not present

## 2016-05-16 DIAGNOSIS — C9000 Multiple myeloma not having achieved remission: Secondary | ICD-10-CM | POA: Diagnosis not present

## 2016-05-16 DIAGNOSIS — K219 Gastro-esophageal reflux disease without esophagitis: Secondary | ICD-10-CM | POA: Diagnosis not present

## 2016-05-16 DIAGNOSIS — E039 Hypothyroidism, unspecified: Secondary | ICD-10-CM | POA: Diagnosis not present

## 2016-05-16 DIAGNOSIS — H04123 Dry eye syndrome of bilateral lacrimal glands: Secondary | ICD-10-CM | POA: Diagnosis not present

## 2016-05-16 DIAGNOSIS — D509 Iron deficiency anemia, unspecified: Secondary | ICD-10-CM | POA: Diagnosis not present

## 2016-05-16 DIAGNOSIS — J309 Allergic rhinitis, unspecified: Secondary | ICD-10-CM | POA: Diagnosis not present

## 2016-05-16 DIAGNOSIS — G894 Chronic pain syndrome: Secondary | ICD-10-CM | POA: Diagnosis not present

## 2016-05-16 DIAGNOSIS — M353 Polymyalgia rheumatica: Secondary | ICD-10-CM | POA: Diagnosis not present

## 2016-05-16 DIAGNOSIS — Z7982 Long term (current) use of aspirin: Secondary | ICD-10-CM | POA: Diagnosis not present

## 2016-05-16 DIAGNOSIS — K5901 Slow transit constipation: Secondary | ICD-10-CM | POA: Diagnosis not present

## 2016-05-17 ENCOUNTER — Other Ambulatory Visit (HOSPITAL_COMMUNITY): Payer: Self-pay | Admitting: Oncology

## 2016-05-17 DIAGNOSIS — C9000 Multiple myeloma not having achieved remission: Secondary | ICD-10-CM | POA: Diagnosis not present

## 2016-05-17 DIAGNOSIS — N390 Urinary tract infection, site not specified: Secondary | ICD-10-CM | POA: Diagnosis not present

## 2016-05-17 DIAGNOSIS — M8008XD Age-related osteoporosis with current pathological fracture, vertebra(e), subsequent encounter for fracture with routine healing: Secondary | ICD-10-CM | POA: Diagnosis not present

## 2016-05-17 DIAGNOSIS — M4184 Other forms of scoliosis, thoracic region: Secondary | ICD-10-CM | POA: Diagnosis not present

## 2016-05-17 DIAGNOSIS — M4312 Spondylolisthesis, cervical region: Secondary | ICD-10-CM | POA: Diagnosis not present

## 2016-05-17 DIAGNOSIS — M4004 Postural kyphosis, thoracic region: Secondary | ICD-10-CM | POA: Diagnosis not present

## 2016-05-17 DIAGNOSIS — M353 Polymyalgia rheumatica: Secondary | ICD-10-CM | POA: Diagnosis not present

## 2016-05-18 ENCOUNTER — Other Ambulatory Visit (HOSPITAL_COMMUNITY): Payer: Medicare Other

## 2016-05-18 ENCOUNTER — Encounter (HOSPITAL_BASED_OUTPATIENT_CLINIC_OR_DEPARTMENT_OTHER): Payer: Medicare Other | Admitting: Oncology

## 2016-05-18 ENCOUNTER — Encounter (HOSPITAL_COMMUNITY): Payer: Self-pay | Admitting: Oncology

## 2016-05-18 ENCOUNTER — Other Ambulatory Visit (HOSPITAL_COMMUNITY): Payer: Self-pay | Admitting: Oncology

## 2016-05-18 VITALS — BP 138/45 | HR 60 | Temp 97.9°F | Resp 16 | Wt 139.0 lb

## 2016-05-18 DIAGNOSIS — C9000 Multiple myeloma not having achieved remission: Secondary | ICD-10-CM | POA: Diagnosis not present

## 2016-05-18 DIAGNOSIS — N189 Chronic kidney disease, unspecified: Secondary | ICD-10-CM | POA: Diagnosis not present

## 2016-05-18 DIAGNOSIS — M545 Low back pain: Secondary | ICD-10-CM | POA: Diagnosis not present

## 2016-05-18 NOTE — Addendum Note (Signed)
Addended by: [facility] on: 05/18/2016 05:39 PM   Modules accepted: Level of Service

## 2016-05-18 NOTE — Patient Instructions (Signed)
[place] at [facility] Discharge Instructions  RECOMMENDATIONS MADE BY THE CONSULTANT AND ANY TEST RESULTS WILL BE SENT TO YOUR REFERRING PHYSICIAN.  You were seen today by Kirby Crigler PA-C. Labs today, we will call with results. Return Monday for treatment and injection. Continue taking Calcium 1200mg  and Vit D 1000units. Return on 2/12 for follow up.   Thank you for choosing [place] at [facility] to provide your oncology and hematology care.  To afford each patient quality time with our provider, please arrive at least 15 minutes before your scheduled appointment time.    If you have a lab appointment with the [facility] please come in thru the  Main Entrance and check in at the main information desk  You need to re-schedule your appointment should you arrive 10 or more minutes late.  We strive to give you quality time with our providers, and arriving late affects you and other patients whose appointments are after yours.  Also, if you no show three or more times for appointments you may be dismissed from the clinic at the providers discretion.     Again, thank you for choosing [facility].  Our hope is that these requests will decrease the amount of time that you wait before being seen by our physicians.       _____________________________________________________________  Should you have questions after your visit to [facility], please contact our office at [phone] between the hours of 8:30 a.m. and 4:30 p.m.  Voicemails left after 4:30 p.m. will not be returned until the following business day.  For prescription refill requests, have your pharmacy contact our office.       Resources For Cancer Patients and their Caregivers ? American Cancer Society: Can assist with transportation, wigs, general needs, runs Look Good Feel Better.        8457496546 ? Cancer Care: Provides financial  assistance, online support groups, medication/co-pay assistance.  1-800-813-HOPE 725 542 7090) ? [facility] Assists [place] cancer patients and their families through emotional , educational and financial support.  867-172-2256 ? [place] DSS Where to apply for food stamps, Medicaid and utility assistance. 760-567-6016 ? RCATS: Transportation to medical appointments. (406)076-7022 ? Social Security Administration: May apply for disability if have a Stage IV cancer. 432-700-9470 629-679-7780 ? [employer], Disability and Transit Services: Assists with nutrition, care and transit needs. [place] Support Programs: @10RELATIVEDAYS @ > Cancer Support Group  2nd Tuesday of the month 1pm-2pm, Journey Room  > Creative Journey  3rd Tuesday of the month 1130am-1pm, Journey Room  > Look Good Feel Better  1st Wednesday of the month 10am-12 noon, Journey Room (Call [place] to register [phone])

## 2016-05-18 NOTE — Progress Notes (Signed)
Marilyn Neighbors, MD [place] [address]  Multiple myeloma not having achieved remission ([place]) - Plan: Kappa/lambda light chains, Beta 2 microglobuline, serum, IgG, IgA, IgM, Immunofixation electrophoresis, Protein electrophoresis, serum  CURRENT THERAPY: Velcade weekly beginning on 05/07/2016 and Xgeva monthly beginning on 05/18/2016.  INTERVAL HISTORY: [name] 83 y.o. female returns for followup of Multiple myeloma, kappa light chain restricted with bone marrow aspiration and biopsy on 01/20/2016 demonstrating 24% plasma cells.  On Revlimid/dexamethasone 10 mg daily beginning ~ 02/24/2016.  She developed intolerance to Dexamethasone (headaches) and therefore, this was discontinued.  Her treatment course has been complicated by Revlimid-induced rash and back pain resulting in discontinuation of Revlimid (with resolution of back pain).  Now on weekly Velcade beginning on 05/07/2016 and Xgeva beginning on 05/18/2016.    Multiple myeloma ([place])   01/20/2016 Procedure    Bone marrow aspiration and biopsy       01/24/2016 Pathology Results    Bone Marrow, Aspirate,Biopsy, and Clot, left iliac crest - HYPERCELLULAR BONE MARROW FOR AGE WITH PLASMA CELL NEOPLASM. - TRILINEAGE HEMATOPOIESIS. - SEE COMMENT. PERIPHERAL BLOOD: - MACROCYTIC ANEMIA. Diagnosis Note The bone marrow is hypercellular for age with trilineage hematopoiesis and nonspecific myeloid changes. The plasma cells are increased in number representing 24% of all cells associated with atypical cytomorphologic features. Immunohistochemical stains show kappa light chain restriction in plasma cells consistent with plasma cell neoplasm. Correlation with cytogenetic and FISH studies is recommended.      02/09/2016 Imaging    Bone survey- Single lucent lesion identified in the L2 vertebral body. This may be related to multiple myeloma.      02/22/2016 Adverse Reaction    Dexamethasone discontinued due to  increased headaches      02/24/2016 - 04/26/2016 Chemotherapy    Revlimid      03/09/2016 Adverse Reaction    Revlimid-induced rash       03/09/2016 Treatment Plan Change    Revlimid on hold for rash      03/18/2016 Treatment Plan Change    Revlimid restarted on       03/27/2016 Adverse Reaction    Back pain      03/27/2016 Imaging    Xray T-spine- Diffuse multilevel degenerative change in osteopenia. Prior mid thoracic vertebroplasties again noted. These appears stable. Multiple old mild thoracic vertebral body compression fractures. No new compression fracture noted.      04/09/2016 Imaging    CT C, T, L-spine- Cervical spine degenerative changes as above. Negative for fracture or mass. No evidence of myeloma in the cervical spine.  Negative for thoracic mass lesion. Multiple chronic thoracic compression fractures. No acute fracture. Prior vertebroplasty of T6, T7, and T8 vertebral bodies      04/26/2016 Treatment Plan Change    Revlimid discontinued due to unexplained back pain.      05/07/2016 -  Chemotherapy    The patient had bortezomib SQ (VELCADE) chemo injection 2.25 mg, 1.3 mg/m2 = 2.25 mg, Subcutaneous,  Once, 1 of 4 cycles  for chemotherapy treatment.        05/18/2016 Miscellaneous    Xgeva started      She continues with her back pain.  She described it as burning and spasms.  It comes and goes.  She notes that sitting up helps with this discomfort.  She is taking pain medication.  She has seen Ortho in [place] for this back pain after we could not explain  it based upon imaging.  He does not see any acute issues.  She describes the pain as burning today.  It certainly sounds more neuropathic than anything else.  She recently started an antibiotic empirically for UTI.  She is tolerating Velcade well thus far.  We discussed starting bone targeted therapy which ortho agrees with.  She is advised that it is part of treatment for MM but we were hesitant to  start Zometa upfront given her renal function.  Now that Denosumab is approved, this will be a better option for her given her chronic renal disease.  Review of Systems  Constitutional: Negative.  Negative for chills and fever.  HENT: Negative.   Eyes: Negative.   Respiratory: Negative.   Cardiovascular: Negative.  Negative for chest pain.  Gastrointestinal: Negative.  Negative for constipation, diarrhea, nausea and vomiting.  Genitourinary: Positive for frequency and urgency.  Musculoskeletal: Positive for back pain.  Skin: Negative.   Neurological: Negative.  Negative for weakness.  Endo/Heme/Allergies: Negative.   Psychiatric/Behavioral: Negative.     Past Medical History:  Diagnosis Date  . Anemia   . Bradycardia   . Constipation   . Degenerative joint disease   . Diverticulosis   . External hemorrhoids   . GERD (gastroesophageal reflux disease)   . Goiter, unspecified   . Hyperlipidemia   . Hypertension   . Hypothyroidism   . Macular degeneration   . Multiple myeloma ([place]) 03/12/2016  . Osteoporosis   . Pacemaker   . Palpitations   . Seasonal allergies   . Thyrotoxicosis without mention of goiter or other cause, without mention of thyrotoxic crisis or storm     Past Surgical History:  Procedure Laterality Date  . COLONOSCOPY  November 2011   External hemorrhoids, diverticulosis, anal papillae  . COLONOSCOPY N/A 04/01/2014   Procedure: COLONOSCOPY;  Surgeon: Rogene Houston, MD;  [facility];  Service: Endoscopy;  Laterality: N/A;  930  . FEMUR IM NAIL Left 12/29/2012   Procedure: INTRAMEDULLARY (IM) NAIL INTERTROCHANTRIC;  Surgeon: Melina Schools, MD;  Location: [facility];  Service: Orthopedics;  Laterality: Left;  . INSERT / REPLACE / REMOVE PACEMAKER    . PERMANENT PACEMAKER INSERTION N/A 06/07/2011   Procedure: PERMANENT PACEMAKER INSERTION;  Surgeon: Evans Lance, MD;  Location: [facility] CATH LAB;  Service: Cardiovascular;  Laterality: N/A;  . THYROIDECTOMY     . TONSILLECTOMY    . vertebroplasty      Family History  Problem Relation Age of Onset  . Heart attack Mother     Deceased  . Cancer Father     Deceased, colon cancer age 16  . Cancer Brother     Deceased, throat and lung    Social History   Social History  . Marital status: Widowed    Spouse name: N/A  . Number of children: N/A  . Years of education: N/A   Social History Main Topics  . Smoking status: Never Smoker  . Smokeless tobacco: Never Used  . Alcohol use No  . Drug use: No  . Sexual activity: No   Other Topics Concern  . None   Social History Narrative  . None     PHYSICAL EXAMINATION  ECOG PERFORMANCE STATUS: 1 - Symptomatic but completely ambulatory  Vitals:   05/18/16 1234  BP: (!) 138/45  Pulse: 60  Resp: 16  Temp: 97.9 F (36.6 C)     GENERAL:alert, no distress, well nourished, well developed, comfortable, cooperative, smiling and accompanied  by daughter. SKIN: skin color, texture, turgor are normal, no rashes or significant lesions HEAD: Normocephalic, No masses, lesions, tenderness or abnormalities EYES: normal, EOMI, Conjunctiva are pink and non-injected EARS: External ears normal OROPHARYNX:lips, buccal mucosa, and tongue normal and mucous membranes are moist  NECK: supple, trachea midline LYMPH:  not examined BREAST:not examined LUNGS: clear to auscultation  HEART: regular rate & rhythm ABDOMEN:abdomen soft and normal bowel sounds BACK: Back symmetric, no curvature. EXTREMITIES:less then 2 second capillary refill, no joint deformities, effusion, or inflammation, no skin discoloration, no cyanosis  NEURO: alert & oriented x 3 with fluent speech, no focal motor/sensory deficits, gait normal with a walker.   LABORATORY DATA: CBC    Component Value Date/Time   WBC 5.9 05/14/2016 1318   RBC 3.11 (L) 05/14/2016 1318   HGB 10.1 (L) 05/14/2016 1318   HCT 30.8 (L) 05/14/2016 1318   HCT 31.7 (L) 12/14/2015 1057   PLT 263 05/14/2016  1318   MCV 99.0 05/14/2016 1318   MCH 32.5 05/14/2016 1318   MCHC 32.8 05/14/2016 1318   RDW 18.0 (H) 05/14/2016 1318   LYMPHSABS 0.9 05/14/2016 1318   MONOABS 0.4 05/14/2016 1318   EOSABS 0.0 05/14/2016 1318   BASOSABS 0.1 05/14/2016 1318      Chemistry      Component Value Date/Time   NA 135 05/14/2016 1318   NA 130 (A) 12/15/2014   K 4.3 05/14/2016 1318   CL 98 (L) 05/14/2016 1318   CO2 30 05/14/2016 1318   BUN 22 (H) 05/14/2016 1318   BUN 26 (A) 12/15/2014   CREATININE 1.22 (H) 05/14/2016 1318   GLU 79 12/15/2014      Component Value Date/Time   CALCIUM 8.9 05/14/2016 1318   ALKPHOS 49 05/14/2016 1318   AST 15 05/14/2016 1318   ALT 9 (L) 05/14/2016 1318   BILITOT 0.4 05/14/2016 1318        PENDING LABS:   RADIOGRAPHIC STUDIES:  No results found.   PATHOLOGY:    ASSESSMENT AND PLAN:  Multiple myeloma ([facility]) Multiple myeloma, kappa light chain restricted with bone marrow aspiration and biopsy on 01/20/2016 demonstrating 24% plasma cells.  On Revlimid/dexamethasone 10 mg daily beginning ~ 02/24/2016.  She developed intolerance to Dexamethasone (headaches) and therefore, this was discontinued.  Her treatment course has been complicated by Revlimid-induced rash and back pain resulting in discontinuation of Revlimid (with resolution of back pain).  Now on weekly Velcade beginning on 05/07/2016 and Xgeva beginning on 05/21/2016.  Labs today: CBC diff, CMET, SPEP+IFE, light chain assay, and B2M.  I personally reviewed and went over laboratory results with the patient.  The results are noted within this dictation.   She will not need labs when she returns on Monday 1/29, for Velcade/Xgeva.  Weekly labs: CBC diff, CMET.   Continue with Velcade weekly, due on Monday, 1/29.  Delton See will begin with her next treatment.  I reviewed the risks, benefits, alternatives, and side effects of Xgeva including, but not limited to, hypocalcemia and ONJ.  She will continue with Ca++  and Vit D.  Return in 2-3 weeks for follow-up.   ORDERS PLACED FOR THIS ENCOUNTER: Orders Placed This Encounter  Procedures  . Kappa/lambda light chains  . Beta 2 microglobuline, serum  . IgG, IgA, IgM  . Immunofixation electrophoresis  . Protein electrophoresis, serum    MEDICATIONS PRESCRIBED THIS ENCOUNTER: Meds ordered this encounter  Medications  . ciprofloxacin (CIPRO) 250 MG tablet  . gabapentin (NEURONTIN) 100 MG  capsule    THERAPY PLAN:  Velcade/Xgeva  All questions were answered. The patient knows to call the clinic with any problems, questions or concerns. We can certainly see the patient much sooner if necessary.  Patient and plan discussed with Dr. Ancil Linsey and she is in agreement with the aforementioned.   This note is electronically signed by: Doy Mince 05/18/2016 5:05 PM

## 2016-05-18 NOTE — Assessment & Plan Note (Addendum)
Multiple myeloma, kappa light chain restricted with bone marrow aspiration and biopsy on 01/20/2016 demonstrating 24% plasma cells.  On Revlimid/dexamethasone 10 mg daily beginning ~ 02/24/2016.  She developed intolerance to Dexamethasone (headaches) and therefore, this was discontinued.  Her treatment course has been complicated by Revlimid-induced rash and back pain resulting in discontinuation of Revlimid (with resolution of back pain).  Now on weekly Velcade beginning on 05/07/2016 and Xgeva beginning on 05/21/2016.  Labs today: CBC diff, CMET, SPEP+IFE, light chain assay, and B2M.  I personally reviewed and went over laboratory results with the patient.  The results are noted within this dictation.   She will not need labs when she returns on Monday 1/29, for Velcade/Xgeva.  Weekly labs: CBC diff, CMET.   Continue with Velcade weekly, due on Monday, 1/29.  Delton See will begin with her next treatment.  I reviewed the risks, benefits, alternatives, and side effects of Xgeva including, but not limited to, hypocalcemia and ONJ.  She will continue with Ca++ and Vit D.  Return in 2-3 weeks for follow-up.

## 2016-05-21 ENCOUNTER — Encounter (HOSPITAL_COMMUNITY): Payer: Self-pay | Admitting: *Deleted

## 2016-05-21 ENCOUNTER — Other Ambulatory Visit (HOSPITAL_COMMUNITY): Payer: Medicare Other

## 2016-05-21 ENCOUNTER — Encounter (HOSPITAL_COMMUNITY): Payer: Self-pay

## 2016-05-21 ENCOUNTER — Other Ambulatory Visit (HOSPITAL_COMMUNITY): Payer: Self-pay | Admitting: Oncology

## 2016-05-21 ENCOUNTER — Other Ambulatory Visit (HOSPITAL_COMMUNITY): Payer: Self-pay | Admitting: *Deleted

## 2016-05-21 ENCOUNTER — Encounter (HOSPITAL_COMMUNITY): Payer: Medicare Other

## 2016-05-21 ENCOUNTER — Encounter (HOSPITAL_BASED_OUTPATIENT_CLINIC_OR_DEPARTMENT_OTHER): Payer: Medicare Other

## 2016-05-21 VITALS — BP 144/56 | HR 60 | Temp 98.0°F | Resp 18

## 2016-05-21 DIAGNOSIS — Z5112 Encounter for antineoplastic immunotherapy: Secondary | ICD-10-CM | POA: Diagnosis present

## 2016-05-21 DIAGNOSIS — Z9221 Personal history of antineoplastic chemotherapy: Secondary | ICD-10-CM | POA: Diagnosis not present

## 2016-05-21 DIAGNOSIS — C9000 Multiple myeloma not having achieved remission: Secondary | ICD-10-CM | POA: Diagnosis not present

## 2016-05-21 DIAGNOSIS — G8929 Other chronic pain: Secondary | ICD-10-CM

## 2016-05-21 DIAGNOSIS — M545 Low back pain: Principal | ICD-10-CM

## 2016-05-21 DIAGNOSIS — Z5189 Encounter for other specified aftercare: Secondary | ICD-10-CM | POA: Diagnosis not present

## 2016-05-21 DIAGNOSIS — Z9889 Other specified postprocedural states: Secondary | ICD-10-CM | POA: Diagnosis not present

## 2016-05-21 LAB — COMPREHENSIVE METABOLIC PANEL
ALBUMIN: 3.7 g/dL (ref 3.5–5.0)
ALK PHOS: 57 U/L (ref 38–126)
ALT: 10 U/L — AB (ref 14–54)
ANION GAP: 8 (ref 5–15)
AST: 18 U/L (ref 15–41)
BILIRUBIN TOTAL: 0.6 mg/dL (ref 0.3–1.2)
BUN: 19 mg/dL (ref 6–20)
CALCIUM: 8.9 mg/dL (ref 8.9–10.3)
CO2: 29 mmol/L (ref 22–32)
CREATININE: 1.02 mg/dL — AB (ref 0.44–1.00)
Chloride: 96 mmol/L — ABNORMAL LOW (ref 101–111)
GFR calc Af Amer: 54 mL/min — ABNORMAL LOW (ref >60)
GFR calc non Af Amer: 47 mL/min — ABNORMAL LOW (ref >60)
GLUCOSE: 151 mg/dL — AB (ref 65–99)
Potassium: 4.1 mmol/L (ref 3.5–5.1)
SODIUM: 133 mmol/L — AB (ref 135–145)
TOTAL PROTEIN: 5.5 g/dL — AB (ref 6.5–8.1)

## 2016-05-21 LAB — CBC WITH DIFFERENTIAL/PLATELET
Basophils Absolute: 0 10*3/uL (ref 0.0–0.1)
Basophils Relative: 1 %
Eosinophils Absolute: 0.1 10*3/uL (ref 0.0–0.7)
Eosinophils Relative: 1 %
HEMATOCRIT: 30.3 % — AB (ref 36.0–46.0)
HEMOGLOBIN: 10.1 g/dL — AB (ref 12.0–15.0)
Lymphocytes Relative: 15 %
Lymphs Abs: 1 10*3/uL (ref 0.7–4.0)
MCH: 32.6 pg (ref 26.0–34.0)
MCHC: 33.3 g/dL (ref 30.0–36.0)
MCV: 97.7 fL (ref 78.0–100.0)
MONOS PCT: 10 %
Monocytes Absolute: 0.6 10*3/uL (ref 0.1–1.0)
NEUTROS ABS: 4.6 10*3/uL (ref 1.7–7.7)
NEUTROS PCT: 73 %
Platelets: 259 10*3/uL (ref 150–400)
RBC: 3.1 MIL/uL — AB (ref 3.87–5.11)
RDW: 18.1 % — ABNORMAL HIGH (ref 11.5–15.5)
WBC: 6.3 10*3/uL (ref 4.0–10.5)

## 2016-05-21 MED ORDER — OXYCODONE-ACETAMINOPHEN 5-325 MG PO TABS: 1.0000 | ORAL_TABLET | Freq: Four times a day (QID) | ORAL | 0 refills | Status: DC | PRN

## 2016-05-21 MED ORDER — DENOSUMAB 120 MG/1.7ML ~~LOC~~ SOLN
120.0000 mg | Freq: Once | SUBCUTANEOUS | Status: AC
  Administered 2016-05-21: 120 mg via SUBCUTANEOUS
  Filled 2016-05-21: qty 1.7

## 2016-05-21 MED ORDER — PROCHLORPERAZINE MALEATE 10 MG PO TABS
10.0000 mg | ORAL_TABLET | Freq: Once | ORAL | Status: AC
  Administered 2016-05-21: 10 mg via ORAL
  Filled 2016-05-21: qty 1

## 2016-05-21 MED ORDER — BORTEZOMIB CHEMO SQ INJECTION 3.5 MG (2.5MG/ML)
1.3000 mg/m2 | Freq: Once | INTRAMUSCULAR | Status: AC
  Administered 2016-05-21: 2.25 mg via SUBCUTANEOUS
  Filled 2016-05-21: qty 2.25

## 2016-05-21 NOTE — Progress Notes (Signed)
Chemotherapy injection given today. Patient complaining of back spasms, she states she thinks the velcade is causing this. She states this happened before. MD is aware of patients pain issues. Vitals stable and discharged from clinic via wheelchair with family. Follow up as scheduled.   [name] presents today for injection per MD orders. Xgeva 120 mg given administered SQ in right Abdomen. Administration without incident. Patient tolerated well.

## 2016-05-21 NOTE — Progress Notes (Signed)
[name] stopped by this morning stating that his mother is still having a lot of pain and that the medication is not working. He stated that his mother told him that she hurts so bad she wanted to stop the treatments and let nature take it's course.   I spoke with Kirby Crigler PA-C, he advised that there really is nothing else we can do for the pt's pain. Tom changed the pt's pain medication and advised for the pt's son to call us if this doesn't help with her pain.  I gave the Rx to the pt's son and advised him of above. He verbalized understanding.

## 2016-05-21 NOTE — Patient Instructions (Signed)
[facility] Discharge Instructions for Patients Receiving Chemotherapy   Beginning January 23rd 2017 lab work for the Cancer Center will be done in the  Main lab at  on 1st floor. If you have a lab appointment with the Cancer Center please come in thru the  Main Entrance and check in at the main information desk   Today you received the following chemotherapy agents   To help prevent nausea and vomiting after your treatment, we encourage you to take your nausea medication     If you develop nausea and vomiting, or diarrhea that is not controlled by your medication, call the clinic.  The clinic phone number is [phone]. Office hours are Monday-Friday 8:30am-5:00pm.  BELOW ARE SYMPTOMS THAT SHOULD BE REPORTED IMMEDIATELY:  *FEVER GREATER THAN 101.0 F  *CHILLS WITH OR WITHOUT FEVER  NAUSEA AND VOMITING THAT IS NOT CONTROLLED WITH YOUR NAUSEA MEDICATION  *UNUSUAL SHORTNESS OF BREATH  *UNUSUAL BRUISING OR BLEEDING  TENDERNESS IN MOUTH AND THROAT WITH OR WITHOUT PRESENCE OF ULCERS  *URINARY PROBLEMS  *BOWEL PROBLEMS  UNUSUAL RASH Items with * indicate a potential emergency and should be followed up as soon as possible. If you have an emergency after office hours please contact your primary care physician or go to the nearest emergency department.  Please call the clinic during office hours if you have any questions or concerns.   You may also contact the Patient Navigator at [phone] should you have any questions or need assistance in obtaining follow up care.      Resources For Cancer Patients and their Caregivers ? American Cancer Society: Can assist with transportation, wigs, general needs, runs Look Good Feel Better.        1-888-227-6333 ? Cancer Care: Provides financial assistance, online support groups, medication/co-pay assistance.  1-800-813-HOPE (4673) ? [facility] Assists [place] cancer  patients and their families through emotional , educational and financial support.  336-427-4357 ? [place] DSS Where to apply for food stamps, Medicaid and utility assistance. 336-342-1394 ? [employer]: Transportation to medical appointments. 336-347-2287 ? Social Security Administration: May apply for disability if have a Stage IV cancer. 336-342-7796 1-800-772-1213 ? [place] Aging, Disability and Transit Services: Assists with nutrition, care and transit needs. [phone]         

## 2016-05-22 ENCOUNTER — Encounter (HOSPITAL_COMMUNITY): Payer: Self-pay | Admitting: Hematology & Oncology

## 2016-05-22 DIAGNOSIS — M4312 Spondylolisthesis, cervical region: Secondary | ICD-10-CM | POA: Diagnosis not present

## 2016-05-22 DIAGNOSIS — M4004 Postural kyphosis, thoracic region: Secondary | ICD-10-CM | POA: Diagnosis not present

## 2016-05-22 DIAGNOSIS — M353 Polymyalgia rheumatica: Secondary | ICD-10-CM | POA: Diagnosis not present

## 2016-05-22 DIAGNOSIS — M8008XD Age-related osteoporosis with current pathological fracture, vertebra(e), subsequent encounter for fracture with routine healing: Secondary | ICD-10-CM | POA: Diagnosis not present

## 2016-05-22 DIAGNOSIS — C9000 Multiple myeloma not having achieved remission: Secondary | ICD-10-CM | POA: Diagnosis not present

## 2016-05-22 DIAGNOSIS — M4184 Other forms of scoliosis, thoracic region: Secondary | ICD-10-CM | POA: Diagnosis not present

## 2016-05-23 DIAGNOSIS — G894 Chronic pain syndrome: Secondary | ICD-10-CM | POA: Diagnosis not present

## 2016-05-24 ENCOUNTER — Telehealth (HOSPITAL_COMMUNITY): Payer: Self-pay | Admitting: *Deleted

## 2016-05-24 ENCOUNTER — Encounter (HOSPITAL_COMMUNITY): Payer: Self-pay | Admitting: Hematology & Oncology

## 2016-05-24 ENCOUNTER — Other Ambulatory Visit (HOSPITAL_COMMUNITY): Payer: Self-pay | Admitting: Oncology

## 2016-05-24 DIAGNOSIS — M4004 Postural kyphosis, thoracic region: Secondary | ICD-10-CM | POA: Diagnosis not present

## 2016-05-24 DIAGNOSIS — M4184 Other forms of scoliosis, thoracic region: Secondary | ICD-10-CM | POA: Diagnosis not present

## 2016-05-24 DIAGNOSIS — C9000 Multiple myeloma not having achieved remission: Secondary | ICD-10-CM | POA: Diagnosis not present

## 2016-05-24 DIAGNOSIS — F419 Anxiety disorder, unspecified: Secondary | ICD-10-CM | POA: Diagnosis not present

## 2016-05-24 DIAGNOSIS — F321 Major depressive disorder, single episode, moderate: Secondary | ICD-10-CM | POA: Diagnosis not present

## 2016-05-24 DIAGNOSIS — M353 Polymyalgia rheumatica: Secondary | ICD-10-CM | POA: Diagnosis not present

## 2016-05-24 DIAGNOSIS — M4312 Spondylolisthesis, cervical region: Secondary | ICD-10-CM | POA: Diagnosis not present

## 2016-05-24 DIAGNOSIS — M8008XD Age-related osteoporosis with current pathological fracture, vertebra(e), subsequent encounter for fracture with routine healing: Secondary | ICD-10-CM | POA: Diagnosis not present

## 2016-05-24 MED ORDER — ACYCLOVIR 400 MG PO TABS: 400.0000 mg | ORAL_TABLET | Freq: Two times a day (BID) | ORAL | 5 refills | Status: DC

## 2016-05-24 NOTE — Telephone Encounter (Signed)
Patient's son is here at clinic.  Stating that his mother is having the cramps in hands and legs again.  She is concerned that it is the new cancer treatment and wants to stop taking it to see if the new symptoms will go away.   Dr. Talbert Cage and Caroleen Hamman, PA-C consulted and they advised for patient to stop taking Velcade, Xgeva and Acyclovir for 4-6 weeks and return for a follow up visit in the office to see how she does.    Advised son and he verbalizes understanding and agrees with treatment going forward.  Orders given to him to stop Acyclovir for the nursing home in which [name] resides.  Advised him to call us in the mean time if she continues to have trouble with cramping.

## 2016-05-25 ENCOUNTER — Encounter: Payer: Self-pay | Admitting: Internal Medicine

## 2016-05-28 ENCOUNTER — Inpatient Hospital Stay (HOSPITAL_COMMUNITY): Payer: Medicare Other

## 2016-05-28 ENCOUNTER — Other Ambulatory Visit (HOSPITAL_COMMUNITY): Payer: Medicare Other

## 2016-05-30 DIAGNOSIS — I10 Essential (primary) hypertension: Secondary | ICD-10-CM | POA: Diagnosis not present

## 2016-05-30 DIAGNOSIS — E119 Type 2 diabetes mellitus without complications: Secondary | ICD-10-CM | POA: Diagnosis not present

## 2016-05-30 DIAGNOSIS — E039 Hypothyroidism, unspecified: Secondary | ICD-10-CM | POA: Diagnosis not present

## 2016-05-30 DIAGNOSIS — E785 Hyperlipidemia, unspecified: Secondary | ICD-10-CM | POA: Diagnosis not present

## 2016-05-31 DIAGNOSIS — F321 Major depressive disorder, single episode, moderate: Secondary | ICD-10-CM | POA: Diagnosis not present

## 2016-05-31 DIAGNOSIS — F419 Anxiety disorder, unspecified: Secondary | ICD-10-CM | POA: Diagnosis not present

## 2016-06-04 ENCOUNTER — Other Ambulatory Visit (HOSPITAL_COMMUNITY): Payer: Medicare Other

## 2016-06-04 ENCOUNTER — Inpatient Hospital Stay (HOSPITAL_COMMUNITY): Payer: Medicare Other

## 2016-06-05 ENCOUNTER — Telehealth (HOSPITAL_COMMUNITY): Payer: Self-pay | Admitting: Oncology

## 2016-06-05 NOTE — Telephone Encounter (Signed)
Pt was approved for a $10,000 grant by [employer] 04/02/16-04/01/17

## 2016-06-06 ENCOUNTER — Ambulatory Visit (INDEPENDENT_AMBULATORY_CARE_PROVIDER_SITE_OTHER): Payer: Medicare Other | Admitting: *Deleted

## 2016-06-06 DIAGNOSIS — I495 Sick sinus syndrome: Secondary | ICD-10-CM

## 2016-06-06 DIAGNOSIS — L853 Xerosis cutis: Secondary | ICD-10-CM | POA: Diagnosis not present

## 2016-06-06 DIAGNOSIS — E1340 Other specified diabetes mellitus with diabetic neuropathy, unspecified: Secondary | ICD-10-CM | POA: Diagnosis not present

## 2016-06-06 NOTE — Progress Notes (Signed)
Remote pacemaker transmission.   

## 2016-06-07 ENCOUNTER — Encounter: Payer: Self-pay | Admitting: Cardiology

## 2016-06-07 DIAGNOSIS — F321 Major depressive disorder, single episode, moderate: Secondary | ICD-10-CM | POA: Diagnosis not present

## 2016-06-07 DIAGNOSIS — F419 Anxiety disorder, unspecified: Secondary | ICD-10-CM | POA: Diagnosis not present

## 2016-06-11 ENCOUNTER — Ambulatory Visit (HOSPITAL_COMMUNITY): Payer: Medicare Other | Admitting: Oncology

## 2016-06-11 ENCOUNTER — Inpatient Hospital Stay (HOSPITAL_COMMUNITY): Payer: Medicare Other

## 2016-06-11 ENCOUNTER — Other Ambulatory Visit (HOSPITAL_COMMUNITY): Payer: Medicare Other

## 2016-06-13 DIAGNOSIS — H04123 Dry eye syndrome of bilateral lacrimal glands: Secondary | ICD-10-CM | POA: Diagnosis not present

## 2016-06-13 DIAGNOSIS — R002 Palpitations: Secondary | ICD-10-CM | POA: Diagnosis not present

## 2016-06-13 DIAGNOSIS — J309 Allergic rhinitis, unspecified: Secondary | ICD-10-CM | POA: Diagnosis not present

## 2016-06-13 DIAGNOSIS — D509 Iron deficiency anemia, unspecified: Secondary | ICD-10-CM | POA: Diagnosis not present

## 2016-06-13 DIAGNOSIS — Z7982 Long term (current) use of aspirin: Secondary | ICD-10-CM | POA: Diagnosis not present

## 2016-06-13 DIAGNOSIS — K5901 Slow transit constipation: Secondary | ICD-10-CM | POA: Diagnosis not present

## 2016-06-13 DIAGNOSIS — M353 Polymyalgia rheumatica: Secondary | ICD-10-CM | POA: Diagnosis not present

## 2016-06-13 DIAGNOSIS — I10 Essential (primary) hypertension: Secondary | ICD-10-CM | POA: Diagnosis not present

## 2016-06-13 DIAGNOSIS — G894 Chronic pain syndrome: Secondary | ICD-10-CM | POA: Diagnosis not present

## 2016-06-13 DIAGNOSIS — E039 Hypothyroidism, unspecified: Secondary | ICD-10-CM | POA: Diagnosis not present

## 2016-06-13 DIAGNOSIS — C9000 Multiple myeloma not having achieved remission: Secondary | ICD-10-CM | POA: Diagnosis not present

## 2016-06-13 DIAGNOSIS — K219 Gastro-esophageal reflux disease without esophagitis: Secondary | ICD-10-CM | POA: Diagnosis not present

## 2016-06-15 LAB — CUP PACEART REMOTE DEVICE CHECK
Battery Remaining Percentage: 81 %
Brady Statistic AP VP Percent: 1 %
Brady Statistic AP VS Percent: 61 %
Brady Statistic AS VS Percent: 39 %
Brady Statistic RV Percent Paced: 1 %
Date Time Interrogation Session: 20180214070341
Implantable Lead Implant Date: 20130214
Implantable Lead Location: 753860
Implantable Pulse Generator Implant Date: 20130214
Lead Channel Impedance Value: 400 Ohm
Lead Channel Pacing Threshold Amplitude: 1.25 V
Lead Channel Pacing Threshold Pulse Width: 0.5 ms
Lead Channel Sensing Intrinsic Amplitude: 3.2 mV
Lead Channel Setting Pacing Amplitude: 2 V
Lead Channel Setting Pacing Amplitude: 2.5 V
Lead Channel Setting Pacing Pulse Width: 0.5 ms
Lead Channel Setting Sensing Sensitivity: 2 mV
MDC IDC LEAD IMPLANT DT: 20130214
MDC IDC LEAD LOCATION: 753859
MDC IDC MSMT BATTERY REMAINING LONGEVITY: 92 mo
MDC IDC MSMT BATTERY VOLTAGE: 2.93 V
MDC IDC MSMT LEADCHNL RA IMPEDANCE VALUE: 360 Ohm
MDC IDC MSMT LEADCHNL RA PACING THRESHOLD AMPLITUDE: 0.75 V
MDC IDC MSMT LEADCHNL RA PACING THRESHOLD PULSEWIDTH: 0.5 ms
MDC IDC MSMT LEADCHNL RV SENSING INTR AMPL: 12 mV
MDC IDC STAT BRADY AS VP PERCENT: 1 %
MDC IDC STAT BRADY RA PERCENT PACED: 61 %
Pulse Gen Model: 2210
Pulse Gen Serial Number: 7316836

## 2016-06-20 DIAGNOSIS — G894 Chronic pain syndrome: Secondary | ICD-10-CM | POA: Diagnosis not present

## 2016-06-21 DIAGNOSIS — F419 Anxiety disorder, unspecified: Secondary | ICD-10-CM | POA: Diagnosis not present

## 2016-07-09 ENCOUNTER — Encounter (HOSPITAL_COMMUNITY): Payer: Medicare Other

## 2016-07-09 ENCOUNTER — Encounter (HOSPITAL_COMMUNITY): Payer: Self-pay | Admitting: Hematology

## 2016-07-09 ENCOUNTER — Encounter (HOSPITAL_COMMUNITY): Payer: Medicare Other | Attending: Hematology | Admitting: Hematology

## 2016-07-09 VITALS — BP 158/49 | HR 60 | Temp 98.2°F | Resp 18 | Wt 134.5 lb

## 2016-07-09 DIAGNOSIS — Z79891 Long term (current) use of opiate analgesic: Secondary | ICD-10-CM | POA: Diagnosis not present

## 2016-07-09 DIAGNOSIS — M810 Age-related osteoporosis without current pathological fracture: Secondary | ICD-10-CM | POA: Insufficient documentation

## 2016-07-09 DIAGNOSIS — C9000 Multiple myeloma not having achieved remission: Secondary | ICD-10-CM | POA: Insufficient documentation

## 2016-07-09 DIAGNOSIS — Z79899 Other long term (current) drug therapy: Secondary | ICD-10-CM | POA: Insufficient documentation

## 2016-07-09 DIAGNOSIS — Z7982 Long term (current) use of aspirin: Secondary | ICD-10-CM | POA: Diagnosis not present

## 2016-07-09 DIAGNOSIS — H3530 Unspecified macular degeneration: Secondary | ICD-10-CM | POA: Insufficient documentation

## 2016-07-09 DIAGNOSIS — Z950 Presence of cardiac pacemaker: Secondary | ICD-10-CM | POA: Diagnosis not present

## 2016-07-09 DIAGNOSIS — E039 Hypothyroidism, unspecified: Secondary | ICD-10-CM | POA: Insufficient documentation

## 2016-07-09 DIAGNOSIS — Z9221 Personal history of antineoplastic chemotherapy: Secondary | ICD-10-CM | POA: Insufficient documentation

## 2016-07-09 DIAGNOSIS — Z9889 Other specified postprocedural states: Secondary | ICD-10-CM | POA: Insufficient documentation

## 2016-07-09 DIAGNOSIS — E785 Hyperlipidemia, unspecified: Secondary | ICD-10-CM | POA: Insufficient documentation

## 2016-07-09 DIAGNOSIS — K219 Gastro-esophageal reflux disease without esophagitis: Secondary | ICD-10-CM | POA: Diagnosis not present

## 2016-07-09 DIAGNOSIS — E0590 Thyrotoxicosis, unspecified without thyrotoxic crisis or storm: Secondary | ICD-10-CM | POA: Diagnosis not present

## 2016-07-09 DIAGNOSIS — I10 Essential (primary) hypertension: Secondary | ICD-10-CM | POA: Diagnosis not present

## 2016-07-09 LAB — COMPREHENSIVE METABOLIC PANEL
ALBUMIN: 3.8 g/dL (ref 3.5–5.0)
ALK PHOS: 62 U/L (ref 38–126)
ALT: 10 U/L — AB (ref 14–54)
AST: 18 U/L (ref 15–41)
Anion gap: 8 (ref 5–15)
BUN: 19 mg/dL (ref 6–20)
CALCIUM: 8.4 mg/dL — AB (ref 8.9–10.3)
CO2: 25 mmol/L (ref 22–32)
CREATININE: 1.09 mg/dL — AB (ref 0.44–1.00)
Chloride: 99 mmol/L — ABNORMAL LOW (ref 101–111)
GFR calc non Af Amer: 43 mL/min — ABNORMAL LOW (ref >60)
GFR, EST AFRICAN AMERICAN: 50 mL/min — AB (ref >60)
GLUCOSE: 135 mg/dL — AB (ref 65–99)
Potassium: 4.4 mmol/L (ref 3.5–5.1)
SODIUM: 132 mmol/L — AB (ref 135–145)
Total Bilirubin: 0.8 mg/dL (ref 0.3–1.2)
Total Protein: 5.5 g/dL — ABNORMAL LOW (ref 6.5–8.1)

## 2016-07-09 LAB — CBC WITH DIFFERENTIAL/PLATELET
Basophils Absolute: 0 10*3/uL (ref 0.0–0.1)
Basophils Relative: 1 %
EOS ABS: 0 10*3/uL (ref 0.0–0.7)
Eosinophils Relative: 0 %
HCT: 32 % — ABNORMAL LOW (ref 36.0–46.0)
HEMOGLOBIN: 10.6 g/dL — AB (ref 12.0–15.0)
LYMPHS ABS: 1 10*3/uL (ref 0.7–4.0)
Lymphocytes Relative: 19 %
MCH: 32.6 pg (ref 26.0–34.0)
MCHC: 33.1 g/dL (ref 30.0–36.0)
MCV: 98.5 fL (ref 78.0–100.0)
Monocytes Absolute: 0.4 10*3/uL (ref 0.1–1.0)
Monocytes Relative: 7 %
NEUTROS PCT: 73 %
Neutro Abs: 4.1 10*3/uL (ref 1.7–7.7)
Platelets: 332 10*3/uL (ref 150–400)
RBC: 3.25 MIL/uL — AB (ref 3.87–5.11)
RDW: 17.7 % — ABNORMAL HIGH (ref 11.5–15.5)
WBC: 5.6 10*3/uL (ref 4.0–10.5)

## 2016-07-09 NOTE — Patient Instructions (Addendum)
[place] at [facility] Discharge Instructions  RECOMMENDATIONS MADE BY THE CONSULTANT AND ANY TEST RESULTS WILL BE SENT TO YOUR REFERRING PHYSICIAN.  You were seen today by Dr. Irene Limbo Follow up in 4-6 weeks See Amy up front for appointments    Thank you for choosing [place] at [facility] to provide your oncology and hematology care.  To afford each patient quality time with our provider, please arrive at least 15 minutes before your scheduled appointment time.    If you have a lab appointment with the [facility] please come in thru the  Main Entrance and check in at the main information desk  You need to re-schedule your appointment should you arrive 10 or more minutes late.  We strive to give you quality time with our providers, and arriving late affects you and other patients whose appointments are after yours.  Also, if you no show three or more times for appointments you may be dismissed from the clinic at the providers discretion.     Again, thank you for choosing [facility].  Our hope is that these requests will decrease the amount of time that you wait before being seen by our physicians.       _____________________________________________________________  Should you have questions after your visit to [facility], please contact our office at [phone] between the hours of 8:30 a.m. and 4:30 p.m.  Voicemails left after 4:30 p.m. will not be returned until the following business day.  For prescription refill requests, have your pharmacy contact our office.       Resources For Cancer Patients and their Caregivers ? American Cancer Society: Can assist with transportation, wigs, general needs, runs Look Good Feel Better.        603-222-6820 ? Cancer Care: Provides financial assistance, online support groups, medication/co-pay assistance.  1-800-813-HOPE 949-181-8752) ? [place] Assists [place] cancer patients and their families through emotional , educational and financial support.  276-030-3555 ? [place] DSS Where to apply for food stamps, Medicaid and utility assistance. 7310224606 ? [employer]: Transportation to medical appointments. (571)194-6623 ? Social Security Administration: May apply for disability if have a Stage IV cancer. (215)647-1859 279-374-4855 ? [employer], Disability and Transit Services: Assists with nutrition, care and transit needs. [place] Support Programs: @10RELATIVEDAYS @ > Cancer Support Group  2nd Tuesday of the month 1pm-2pm, Journey Room  > Creative Journey  3rd Tuesday of the month 1130am-1pm, Journey Room  > Look Good Feel Better  1st Wednesday of the month 10am-12 noon, Journey Room (Call [place] to register [phone])

## 2016-07-11 DIAGNOSIS — K5901 Slow transit constipation: Secondary | ICD-10-CM | POA: Diagnosis not present

## 2016-07-11 DIAGNOSIS — Z7982 Long term (current) use of aspirin: Secondary | ICD-10-CM | POA: Diagnosis not present

## 2016-07-11 DIAGNOSIS — J309 Allergic rhinitis, unspecified: Secondary | ICD-10-CM | POA: Diagnosis not present

## 2016-07-11 DIAGNOSIS — E039 Hypothyroidism, unspecified: Secondary | ICD-10-CM | POA: Diagnosis not present

## 2016-07-11 DIAGNOSIS — M353 Polymyalgia rheumatica: Secondary | ICD-10-CM | POA: Diagnosis not present

## 2016-07-11 DIAGNOSIS — C9000 Multiple myeloma not having achieved remission: Secondary | ICD-10-CM | POA: Diagnosis not present

## 2016-07-11 DIAGNOSIS — I10 Essential (primary) hypertension: Secondary | ICD-10-CM | POA: Diagnosis not present

## 2016-07-11 DIAGNOSIS — D509 Iron deficiency anemia, unspecified: Secondary | ICD-10-CM | POA: Diagnosis not present

## 2016-07-11 DIAGNOSIS — H04123 Dry eye syndrome of bilateral lacrimal glands: Secondary | ICD-10-CM | POA: Diagnosis not present

## 2016-07-11 DIAGNOSIS — K219 Gastro-esophageal reflux disease without esophagitis: Secondary | ICD-10-CM | POA: Diagnosis not present

## 2016-07-11 DIAGNOSIS — R002 Palpitations: Secondary | ICD-10-CM | POA: Diagnosis not present

## 2016-07-11 DIAGNOSIS — G894 Chronic pain syndrome: Secondary | ICD-10-CM | POA: Diagnosis not present

## 2016-07-11 NOTE — Progress Notes (Signed)
[name]  HEMATOLOGY ONCOLOGY PROGRESS NOTE  Date of service: .07/09/2016  Patient Care Team: Celene Squibb, MD as PCP - General  CC for multiple yeloma  Diagnosis: Multiple MYeloma not on remission  Current Treatment: -on hold per patients choice  Previous treatment On Revlimid/dexamethasone 10 mg daily beginning ~ 02/24/2016.  She developed intolerance to Dexamethasone (headaches) and therefore, this was discontinued.  Her treatment course has been complicated by Revlimid-induced rash and back pain resulting in discontinuation of Revlimid (with resolution of back pain).  Now on weekly Velcade beginning on 05/07/2016 and Xgeva beginning on 05/18/2016.  SUMMARY OF ONCOLOGIC HISTORY:   Multiple myeloma ([place])   01/20/2016 Procedure    Bone marrow aspiration and biopsy       01/24/2016 Pathology Results    Bone Marrow, Aspirate,Biopsy, and Clot, left iliac crest - HYPERCELLULAR BONE MARROW FOR AGE WITH PLASMA CELL NEOPLASM. - TRILINEAGE HEMATOPOIESIS. - SEE COMMENT. PERIPHERAL BLOOD: - MACROCYTIC ANEMIA. Diagnosis Note The bone marrow is hypercellular for age with trilineage hematopoiesis and nonspecific myeloid changes. The plasma cells are increased in number representing 24% of all cells associated with atypical cytomorphologic features. Immunohistochemical stains show kappa light chain restriction in plasma cells consistent with plasma cell neoplasm. Correlation with cytogenetic and FISH studies is recommended.      02/09/2016 Imaging    Bone survey- Single lucent lesion identified in the L2 vertebral body. This may be related to multiple myeloma.      02/22/2016 Adverse Reaction    Dexamethasone discontinued due to increased headaches      02/24/2016 - 04/26/2016 Chemotherapy    Revlimid      03/09/2016 Adverse Reaction    Revlimid-induced rash       03/09/2016 Treatment Plan Change    Revlimid on hold for rash      03/18/2016 Treatment Plan Change    Revlimid  restarted on       03/27/2016 Adverse Reaction    Back pain      03/27/2016 Imaging    Xray T-spine- Diffuse multilevel degenerative change in osteopenia. Prior mid thoracic vertebroplasties again noted. These appears stable. Multiple old mild thoracic vertebral body compression fractures. No new compression fracture noted.      04/09/2016 Imaging    CT C, T, L-spine- Cervical spine degenerative changes as above. Negative for fracture or mass. No evidence of myeloma in the cervical spine.  Negative for thoracic mass lesion. Multiple chronic thoracic compression fractures. No acute fracture. Prior vertebroplasty of T6, T7, and T8 vertebral bodies      04/26/2016 Treatment Plan Change    Revlimid discontinued due to unexplained back pain.      05/07/2016 -  Chemotherapy    The patient had bortezomib SQ (VELCADE) chemo injection 2.25 mg, 1.3 mg/m2 = 2.25 mg, Subcutaneous,  Once, 1 of 4 cycles  for chemotherapy treatment.        05/18/2016 Miscellaneous    Xgeva started       INTERVAL HISTORY:  Patient is here for follow-up of her multiple myeloma. She was receiving Velcade, Xgeva and Acyclovir but did not tolerate it due to significant muscle cramps and pains and this was discontinued about 4-6 weeks ago. She is here for follow-up with her son. She notes she feels better off these medications. We had a detailed goals of care discussion. Her labs today are relatively stable with no worsening anemia. She prefers to be monitored off the medications and want some more time to  make a decision regarding whether she would want to be back on the medications.  REVIEW OF SYSTEMS:    10 Point review of systems of done and is negative except as noted above.  . Past Medical History:  Diagnosis Date  . Anemia   . Bradycardia   . Constipation   . Degenerative joint disease   . Diverticulosis   . External hemorrhoids   . GERD (gastroesophageal reflux disease)   . Goiter, unspecified   .  Hyperlipidemia   . Hypertension   . Hypothyroidism   . Macular degeneration   . Multiple myeloma ([place]) 03/12/2016  . Osteoporosis   . Pacemaker   . Palpitations   . Seasonal allergies   . Thyrotoxicosis without mention of goiter or other cause, without mention of thyrotoxic crisis or storm     . Past Surgical History:  Procedure Laterality Date  . COLONOSCOPY  November 2011   External hemorrhoids, diverticulosis, anal papillae  . COLONOSCOPY N/A 04/01/2014   Procedure: COLONOSCOPY;  Surgeon: Rogene Houston, MD;  [facility];  Service: Endoscopy;  Laterality: N/A;  930  . FEMUR IM NAIL Left 12/29/2012   Procedure: INTRAMEDULLARY (IM) NAIL INTERTROCHANTRIC;  Surgeon: Melina Schools, MD;  Location: [place];  Service: Orthopedics;  Laterality: Left;  . INSERT / REPLACE / REMOVE PACEMAKER    . PERMANENT PACEMAKER INSERTION N/A 06/07/2011   Procedure: PERMANENT PACEMAKER INSERTION;  Surgeon: Evans Lance, MD;  Location: [facility] CATH LAB;  Service: Cardiovascular;  Laterality: N/A;  . THYROIDECTOMY    . TONSILLECTOMY    . vertebroplasty      . Social History  Substance Use Topics  . Smoking status: Never Smoker  . Smokeless tobacco: Never Used  . Alcohol use No    ALLERGIES:  is allergic to tramadol and sulfa antibiotics.  MEDICATIONS:  Current Outpatient Prescriptions  Medication Sig Dispense Refill  . acetaminophen (TYLENOL) 500 MG tablet Take 1,000 mg by mouth every 6 (six) hours as needed for mild pain.     [name] ALPRAZolam (XANAX) 0.25 MG tablet Take 0.25 mg by mouth at bedtime as needed for sleep.    [name] amLODipine (NORVASC) 5 MG tablet Take 5 mg by mouth at bedtime.     [name] aspirin 325 MG tablet Take 81 mg by mouth daily.     . Calcium Carbonate Antacid (CAL-GEST ANTACID PO) Take by mouth.    . carvedilol (COREG) 6.25 MG tablet Take 1 tablet (6.25 mg total) by mouth 2 (two) times daily with a meal. 60 tablet 0  . [employer] SENNA PLUS 8.6-50 MG tablet Take 1 tablet by mouth 2  (two) times daily.  0  . cycloSPORINE (RESTASIS) 0.05 % ophthalmic emulsion Place 1 drop into both eyes 2 (two) times daily. *Self Administration*    . diphenhydrAMINE (BENADRYL) 25 mg capsule Take 1 capsule (25 mg total) by mouth every 6 (six) hours as needed. Start at the first sign/symptom of rash 30 capsule 1  . diphenhydrAMINE (BENYLIN) 12.5 MG/5ML syrup Take 5 mLs (12.5 mg total) by mouth 4 (four) times daily as needed for itching or allergies. 120 mL 0  . docusate sodium (COLACE) 100 MG capsule Take 100 mg by mouth at bedtime. For constipation    . Ferrous Sulfate Dried (FERROUS SULFATE CR PO) Take by mouth.    . fluticasone (FLONASE) 50 MCG/ACT nasal spray Place 2 sprays into the nose daily. For allergies    . gabapentin (NEURONTIN) 100 MG  capsule     . hydrALAZINE (APRESOLINE) 10 MG tablet Take 10 mg by mouth 2 (two) times daily.    . hydrocortisone (ANUSOL-HC) 2.5 % rectal cream Place 1 application rectally 2 (two) times daily.    . hydrocortisone cream 0.5 % Apply 1 application topically 2 (two) times daily. 30 g 1  . Hypertonic Nasal Wash (SINUS RINSE NA) Place 1 application into both nostrils daily.    [name] levothyroxine (SYNTHROID, LEVOTHROID) 75 MCG tablet Take 75 mcg by mouth daily before breakfast.     . loperamide (IMODIUM) 2 MG capsule Take 2 mg by mouth every 4 (four) hours as needed for diarrhea or loose stools.    [name] loratadine (CLARITIN) 10 MG tablet Take 10 mg by mouth daily.    . magnesium hydroxide (MILK OF MAGNESIA) 400 MG/5ML suspension Take 0.5 mLs by mouth as needed for mild constipation.    . metaxalone (SKELAXIN) 800 MG tablet Take 1 tablet (800 mg total) by mouth 3 (three) times daily. 30 tablet 1  . methylPREDNISolone (MEDROL DOSEPAK) 4 MG TBPK tablet Take as prescribed 21 tablet 0  . multivitamin-lutein (OCUVITE-LUTEIN) CAPS capsule Take 1 capsule by mouth 2 (two) times daily.    [name] omeprazole (PRILOSEC) 20 MG capsule Take 20 mg by mouth daily. Reported on 06/25/2015      . ondansetron (ZOFRAN) 8 MG tablet Take 1 tablet (8 mg total) by mouth every 8 (eight) hours as needed for nausea or vomiting. 20 tablet 1  . oxyCODONE-acetaminophen (PERCOCET/ROXICET) 5-325 MG tablet Take 1 tablet by mouth every 6 (six) hours as needed for severe pain. 30 tablet 0  . polyethylene glycol (MIRALAX / GLYCOLAX) packet Take 17 g by mouth daily.    . polyethylene glycol powder (GLYCOLAX/MIRALAX) powder     . predniSONE (DELTASONE) 5 MG tablet Take 5 mg by mouth daily with breakfast.    . prochlorperazine (COMPAZINE) 10 MG tablet Take 1 tablet (10 mg total) by mouth every 6 (six) hours as needed for nausea or vomiting. 30 tablet 1  . sertraline (ZOLOFT) 50 MG tablet Take 50 mg by mouth daily.    [name] tiZANidine (ZANAFLEX) 4 MG tablet     . acyclovir (ZOVIRAX) 400 MG tablet Take 1 tablet (400 mg total) by mouth 2 (two) times daily. (Patient not taking: Reported on 07/09/2016) 60 tablet 5   No current facility-administered medications for this visit.     PHYSICAL EXAMINATION: ECOG PERFORMANCE STATUS: 1 - Symptomatic but completely ambulatory  . Vitals:   07/09/16 1600  BP: (!) 158/49  Pulse: 60  Resp: 18  Temp: 98.2 F (36.8 C)    Filed Weights   07/09/16 1600  Weight: 134 lb 8 oz (61 kg)   .Body mass index is 22.38 kg/m.  GENERAL:alert, in no acute distress and comfortable SKIN: no acute rashes, no significant lesions EYES: conjunctiva are pink and non-injected, sclera anicteric OROPHARYNX: MMM, no exudates, no oropharyngeal erythema or ulceration NECK: supple, no JVD LYMPH:  no palpable lymphadenopathy in the cervical, axillary or inguinal regions LUNGS: clear to auscultation b/l with normal respiratory effort HEART: regular rate & rhythm ABDOMEN:  normoactive bowel sounds , non tender, not distended. Extremity: no pedal edema PSYCH: alert & oriented x 3 with fluent speech NEURO: no focal motor/sensory deficits  LABORATORY DATA:   I have reviewed the data as  listed  . CBC Latest Ref Rng & Units 07/09/2016 05/21/2016 05/14/2016  WBC 4.0 - 10.5 K/uL 5.6 6.3 5.9  Hemoglobin 12.0 - 15.0 g/dL 10.6(L) 10.1(L) 10.1(L)  Hematocrit 36.0 - 46.0 % 32.0(L) 30.3(L) 30.8(L)  Platelets 150 - 400 K/uL 332 259 263    . CMP Latest Ref Rng & Units 07/09/2016 05/21/2016 05/14/2016  Glucose 65 - 99 mg/dL 135(H) 151(H) 128(H)  BUN 6 - 20 mg/dL 19 19 22(H)  Creatinine 0.44 - 1.00 mg/dL 1.09(H) 1.02(H) 1.22(H)  Sodium 135 - 145 mmol/L 132(L) 133(L) 135  Potassium 3.5 - 5.1 mmol/L 4.4 4.1 4.3  Chloride 101 - 111 mmol/L 99(L) 96(L) 98(L)  CO2 22 - 32 mmol/L 25 29 30   Calcium 8.9 - 10.3 mg/dL 8.4(L) 8.9 8.9  Total Protein 6.5 - 8.1 g/dL 5.5(L) 5.5(L) 5.7(L)  Total Bilirubin 0.3 - 1.2 mg/dL 0.8 0.6 0.4  Alkaline Phos 38 - 126 U/L 62 57 49  AST 15 - 41 U/L 18 18 15   ALT 14 - 54 U/L 10(L) 10(L) 9(L)    RADIOGRAPHIC STUDIES: I have personally reviewed the radiological images as listed and agreed with the findings in the report. No results found.  ASSESSMENT & PLAN:   Multiple myeloma ([facility]) Multiple myeloma, kappa light chain restricted with bone marrow aspiration and biopsy on 01/20/2016 demonstrating 24% plasma cells.  On Revlimid/dexamethasone 10 mg daily beginning ~ 02/24/2016.  She developed intolerance to Dexamethasone (headaches) and therefore, this was discontinued.  Her treatment course has been complicated by Revlimid-induced rash and back pain resulting in discontinuation of Revlimid (with resolution of back pain).  Now on weekly Velcade beginning on 05/07/2016 and Xgeva beginning on 05/21/2016. Plan -Patient has been off her Velcade and denosumab for the last for 6 weeks and notes that she is feeling better . Her hemoglobin is somewhat better and kidney function is stable. No hypercalcemia.  - after a detailed goals of care discussion of discontinuing the medications and consideration of palliative care/hospice versus ongoing treatment the patient chooses to  think about this and follow up with [place] in 4-5 weeks to determine her further plan. -We'll hold off on her myeloma treatments at this time .  Return to care in 4-5 weeks with repeat labs to determine patient's final decision regarding desire to continue myeloma specific treatments or moving to palliative care/hospice .  I spent 20 minutes counseling the patient face to face. The total time spent in the appointment was 25 minutes and more than 50% was on counseling and direct patient cares.    Sullivan Lone MD [place] AAHIVMS [facility] Hematology/Oncology Physician [facility]  (Office):       [phone] (Work cell):  [phone] (Fax):           [phone]

## 2016-07-16 DIAGNOSIS — H04123 Dry eye syndrome of bilateral lacrimal glands: Secondary | ICD-10-CM | POA: Diagnosis not present

## 2016-07-16 DIAGNOSIS — Z7982 Long term (current) use of aspirin: Secondary | ICD-10-CM | POA: Diagnosis not present

## 2016-07-16 DIAGNOSIS — E039 Hypothyroidism, unspecified: Secondary | ICD-10-CM | POA: Diagnosis not present

## 2016-07-16 DIAGNOSIS — I10 Essential (primary) hypertension: Secondary | ICD-10-CM | POA: Diagnosis not present

## 2016-07-16 DIAGNOSIS — K219 Gastro-esophageal reflux disease without esophagitis: Secondary | ICD-10-CM | POA: Diagnosis not present

## 2016-07-16 DIAGNOSIS — C9000 Multiple myeloma not having achieved remission: Secondary | ICD-10-CM | POA: Diagnosis not present

## 2016-07-16 DIAGNOSIS — D509 Iron deficiency anemia, unspecified: Secondary | ICD-10-CM | POA: Diagnosis not present

## 2016-07-16 DIAGNOSIS — J309 Allergic rhinitis, unspecified: Secondary | ICD-10-CM | POA: Diagnosis not present

## 2016-07-16 DIAGNOSIS — R002 Palpitations: Secondary | ICD-10-CM | POA: Diagnosis not present

## 2016-07-16 DIAGNOSIS — G894 Chronic pain syndrome: Secondary | ICD-10-CM | POA: Diagnosis not present

## 2016-07-16 DIAGNOSIS — M353 Polymyalgia rheumatica: Secondary | ICD-10-CM | POA: Diagnosis not present

## 2016-07-16 DIAGNOSIS — K5901 Slow transit constipation: Secondary | ICD-10-CM | POA: Diagnosis not present

## 2016-07-17 DIAGNOSIS — M79674 Pain in right toe(s): Secondary | ICD-10-CM | POA: Diagnosis not present

## 2016-07-17 DIAGNOSIS — B351 Tinea unguium: Secondary | ICD-10-CM | POA: Diagnosis not present

## 2016-07-17 DIAGNOSIS — M79675 Pain in left toe(s): Secondary | ICD-10-CM | POA: Diagnosis not present

## 2016-07-18 DIAGNOSIS — G894 Chronic pain syndrome: Secondary | ICD-10-CM | POA: Diagnosis not present

## 2016-07-19 DIAGNOSIS — F419 Anxiety disorder, unspecified: Secondary | ICD-10-CM | POA: Diagnosis not present

## 2016-08-02 DIAGNOSIS — Z79899 Other long term (current) drug therapy: Secondary | ICD-10-CM | POA: Diagnosis not present

## 2016-08-08 DIAGNOSIS — G894 Chronic pain syndrome: Secondary | ICD-10-CM | POA: Diagnosis not present

## 2016-08-08 DIAGNOSIS — K219 Gastro-esophageal reflux disease without esophagitis: Secondary | ICD-10-CM | POA: Diagnosis not present

## 2016-08-08 DIAGNOSIS — K5901 Slow transit constipation: Secondary | ICD-10-CM | POA: Diagnosis not present

## 2016-08-08 DIAGNOSIS — R002 Palpitations: Secondary | ICD-10-CM | POA: Diagnosis not present

## 2016-08-08 DIAGNOSIS — E039 Hypothyroidism, unspecified: Secondary | ICD-10-CM | POA: Diagnosis not present

## 2016-08-08 DIAGNOSIS — Z7982 Long term (current) use of aspirin: Secondary | ICD-10-CM | POA: Diagnosis not present

## 2016-08-08 DIAGNOSIS — I10 Essential (primary) hypertension: Secondary | ICD-10-CM | POA: Diagnosis not present

## 2016-08-08 DIAGNOSIS — C9000 Multiple myeloma not having achieved remission: Secondary | ICD-10-CM | POA: Diagnosis not present

## 2016-08-08 DIAGNOSIS — M353 Polymyalgia rheumatica: Secondary | ICD-10-CM | POA: Diagnosis not present

## 2016-08-08 DIAGNOSIS — S91301A Unspecified open wound, right foot, initial encounter: Secondary | ICD-10-CM | POA: Diagnosis not present

## 2016-08-08 DIAGNOSIS — D509 Iron deficiency anemia, unspecified: Secondary | ICD-10-CM | POA: Diagnosis not present

## 2016-08-08 DIAGNOSIS — J309 Allergic rhinitis, unspecified: Secondary | ICD-10-CM | POA: Diagnosis not present

## 2016-08-10 ENCOUNTER — Other Ambulatory Visit (HOSPITAL_COMMUNITY): Payer: Self-pay | Admitting: *Deleted

## 2016-08-10 DIAGNOSIS — C9000 Multiple myeloma not having achieved remission: Secondary | ICD-10-CM

## 2016-08-13 ENCOUNTER — Encounter (HOSPITAL_COMMUNITY): Payer: Medicare Other | Attending: Oncology | Admitting: Oncology

## 2016-08-13 ENCOUNTER — Encounter (HOSPITAL_COMMUNITY): Payer: Self-pay

## 2016-08-13 ENCOUNTER — Encounter (HOSPITAL_COMMUNITY): Payer: Medicare Other

## 2016-08-13 VITALS — BP 161/57 | HR 61 | Temp 97.7°F | Resp 20 | Wt 135.8 lb

## 2016-08-13 DIAGNOSIS — C9000 Multiple myeloma not having achieved remission: Secondary | ICD-10-CM | POA: Diagnosis not present

## 2016-08-13 DIAGNOSIS — M353 Polymyalgia rheumatica: Secondary | ICD-10-CM

## 2016-08-13 DIAGNOSIS — D649 Anemia, unspecified: Secondary | ICD-10-CM

## 2016-08-13 LAB — CBC WITH DIFFERENTIAL/PLATELET
BASOS PCT: 1 %
Basophils Absolute: 0 10*3/uL (ref 0.0–0.1)
EOS ABS: 0 10*3/uL (ref 0.0–0.7)
EOS PCT: 1 %
HCT: 33.1 % — ABNORMAL LOW (ref 36.0–46.0)
Hemoglobin: 11 g/dL — ABNORMAL LOW (ref 12.0–15.0)
Lymphocytes Relative: 16 %
Lymphs Abs: 0.9 10*3/uL (ref 0.7–4.0)
MCH: 32.5 pg (ref 26.0–34.0)
MCHC: 33.2 g/dL (ref 30.0–36.0)
MCV: 97.9 fL (ref 78.0–100.0)
MONOS PCT: 8 %
Monocytes Absolute: 0.5 10*3/uL (ref 0.1–1.0)
Neutro Abs: 4.3 10*3/uL (ref 1.7–7.7)
Neutrophils Relative %: 74 %
PLATELETS: 327 10*3/uL (ref 150–400)
RBC: 3.38 MIL/uL — ABNORMAL LOW (ref 3.87–5.11)
RDW: 16.7 % — ABNORMAL HIGH (ref 11.5–15.5)
WBC: 5.8 10*3/uL (ref 4.0–10.5)

## 2016-08-13 LAB — COMPREHENSIVE METABOLIC PANEL
ALBUMIN: 3.9 g/dL (ref 3.5–5.0)
ALK PHOS: 50 U/L (ref 38–126)
ALT: 10 U/L — ABNORMAL LOW (ref 14–54)
ANION GAP: 6 (ref 5–15)
AST: 15 U/L (ref 15–41)
BUN: 16 mg/dL (ref 6–20)
CALCIUM: 8.4 mg/dL — AB (ref 8.9–10.3)
CHLORIDE: 101 mmol/L (ref 101–111)
CO2: 27 mmol/L (ref 22–32)
Creatinine, Ser: 1.01 mg/dL — ABNORMAL HIGH (ref 0.44–1.00)
GFR calc Af Amer: 55 mL/min — ABNORMAL LOW (ref >60)
GFR calc non Af Amer: 47 mL/min — ABNORMAL LOW (ref >60)
GLUCOSE: 126 mg/dL — AB (ref 65–99)
POTASSIUM: 4 mmol/L (ref 3.5–5.1)
SODIUM: 134 mmol/L — AB (ref 135–145)
TOTAL PROTEIN: 5.6 g/dL — AB (ref 6.5–8.1)
Total Bilirubin: 0.3 mg/dL (ref 0.3–1.2)

## 2016-08-13 NOTE — Progress Notes (Signed)
HEMATOLOGY/ONCOLOGY PROGRESS NOTE  Date of Service: 08/13/2016  Patient Care Team: Celene Squibb, MD as PCP - General  CHIEF COMPLAINTS/PURPOSE OF CONSULTATION:  Anemia Multiple Myeloma, IgA kappa  HISTORY OF PRESENTING ILLNESS:  [name] is a 83 y.o. female who returns for follow up of multiple myeloma, IgA kappa.   She is doing well today. She has been off of her treatment for a few weeks now. She stopped the velcade due to muscle spasms. They are gone now. She is very tired, but believes this might be from her age. She lives in assisted living so she stays active. She does have some constipation, which is relieve with a stool softener. She believes this could be from her pain medication. At night, her feet hurt at times. This started after she started the Velcade. She also has some tremors in her hands and feet. Denies loss of appetite, chest pain, SOB, abdominal pain, diarrhea, or any other concerns.   MEDICAL HISTORY:  Past Medical History:  Diagnosis Date   Anemia    Bradycardia    Constipation    Degenerative joint disease    Diverticulosis    External hemorrhoids    GERD (gastroesophageal reflux disease)    Goiter, unspecified    Hyperlipidemia    Hypertension    Hypothyroidism    Macular degeneration    Multiple myeloma ([place]) 03/12/2016   Osteoporosis    Pacemaker    Palpitations    Seasonal allergies    Thyrotoxicosis without mention of goiter or other cause, without mention of thyrotoxic crisis or storm     SURGICAL HISTORY: Past Surgical History:  Procedure Laterality Date   COLONOSCOPY  November 2011   External hemorrhoids, diverticulosis, anal papillae   COLONOSCOPY N/A 04/01/2014   Procedure: COLONOSCOPY;  Surgeon: Rogene Houston, MD;  [facility];  Service: Endoscopy;  Laterality: N/A;  930   FEMUR IM NAIL Left 12/29/2012   Procedure: INTRAMEDULLARY (IM) NAIL INTERTROCHANTRIC;  Surgeon: Melina Schools, MD;  Location:  [facility];  Service: Orthopedics;  Laterality: Left;   INSERT / REPLACE / REMOVE PACEMAKER     PERMANENT PACEMAKER INSERTION N/A 06/07/2011   Procedure: PERMANENT PACEMAKER INSERTION;  Surgeon: Evans Lance, MD;  Location: [facility] CATH LAB;  Service: Cardiovascular;  Laterality: N/A;   THYROIDECTOMY     TONSILLECTOMY     vertebroplasty      SOCIAL HISTORY: Social History   Social History   Marital status: Widowed    Spouse name: N/A   Number of children: N/A   Years of education: N/A   Occupational History   Not on file.   Social History Main Topics   Smoking status: Never Smoker   Smokeless tobacco: Never Used   Alcohol use No   Drug use: No   Sexual activity: No   Other Topics Concern   Not on file   Social History Narrative   No narrative on file    FAMILY HISTORY: Family History  Problem Relation Age of Onset   Heart attack Mother     Deceased   Cancer Father     Deceased, colon cancer age 90   Cancer Brother     Deceased, throat and lung    ALLERGIES:  is allergic to tramadol and sulfa antibiotics.  MEDICATIONS:  Current Outpatient Prescriptions  Medication Sig Dispense Refill   acetaminophen (TYLENOL) 500 MG tablet Take 1,000 mg by mouth every 6 (six) hours as needed  for mild pain.      acyclovir (ZOVIRAX) 400 MG tablet Take 1 tablet (400 mg total) by mouth 2 (two) times daily. 60 tablet 5   ALPRAZolam (XANAX) 0.25 MG tablet Take 0.25 mg by mouth at bedtime as needed for sleep.     amLODipine (NORVASC) 5 MG tablet Take 5 mg by mouth at bedtime.      aspirin 325 MG tablet Take 81 mg by mouth daily.      Calcium Carbonate Antacid (CAL-GEST ANTACID PO) Take by mouth.     carvedilol (COREG) 6.25 MG tablet Take 1 tablet (6.25 mg total) by mouth 2 (two) times daily with a meal. 60 tablet 0   [employer]S SENNA PLUS 8.6-50 MG tablet Take 1 tablet by mouth 2 (two) times daily.  0   cycloSPORINE (RESTASIS) 0.05 % ophthalmic emulsion Place 1 drop into  both eyes 2 (two) times daily. *Self Administration*     diphenhydrAMINE (BENADRYL) 25 mg capsule Take 1 capsule (25 mg total) by mouth every 6 (six) hours as needed. Start at the first sign/symptom of rash 30 capsule 1   diphenhydrAMINE (BENYLIN) 12.5 MG/5ML syrup Take 5 mLs (12.5 mg total) by mouth 4 (four) times daily as needed for itching or allergies. 120 mL 0   docusate sodium (COLACE) 100 MG capsule Take 100 mg by mouth at bedtime. For constipation     Ferrous Sulfate Dried (FERROUS SULFATE CR PO) Take by mouth.     fluticasone (FLONASE) 50 MCG/ACT nasal spray Place 2 sprays into the nose daily. For allergies     gabapentin (NEURONTIN) 100 MG capsule      hydrALAZINE (APRESOLINE) 10 MG tablet Take 10 mg by mouth 2 (two) times daily.     hydrocortisone (ANUSOL-HC) 2.5 % rectal cream Place 1 application rectally 2 (two) times daily.     hydrocortisone cream 0.5 % Apply 1 application topically 2 (two) times daily. 30 g 1   Hypertonic Nasal Wash (SINUS RINSE NA) Place 1 application into both nostrils daily.     levothyroxine (SYNTHROID, LEVOTHROID) 75 MCG tablet Take 75 mcg by mouth daily before breakfast.      loperamide (IMODIUM) 2 MG capsule Take 2 mg by mouth every 4 (four) hours as needed for diarrhea or loose stools.     loratadine (CLARITIN) 10 MG tablet Take 10 mg by mouth daily.     magnesium hydroxide (MILK OF MAGNESIA) 400 MG/5ML suspension Take 0.5 mLs by mouth as needed for mild constipation.     metaxalone (SKELAXIN) 800 MG tablet Take 1 tablet (800 mg total) by mouth 3 (three) times daily. 30 tablet 1   methylPREDNISolone (MEDROL DOSEPAK) 4 MG TBPK tablet Take as prescribed 21 tablet 0   multivitamin-lutein (OCUVITE-LUTEIN) CAPS capsule Take 1 capsule by mouth 2 (two) times daily.     omeprazole (PRILOSEC) 20 MG capsule Take 20 mg by mouth daily. Reported on 06/25/2015     ondansetron (ZOFRAN) 8 MG tablet Take 1 tablet (8 mg total) by mouth every 8 (eight) hours  as needed for nausea or vomiting. 20 tablet 1   oxyCODONE-acetaminophen (PERCOCET/ROXICET) 5-325 MG tablet Take 1 tablet by mouth every 6 (six) hours as needed for severe pain. 30 tablet 0   polyethylene glycol (MIRALAX / GLYCOLAX) packet Take 17 g by mouth daily.     polyethylene glycol powder (GLYCOLAX/MIRALAX) powder      predniSONE (DELTASONE) 5 MG tablet Take 5 mg by mouth daily with breakfast.  prochlorperazine (COMPAZINE) 10 MG tablet Take 1 tablet (10 mg total) by mouth every 6 (six) hours as needed for nausea or vomiting. 30 tablet 1   sertraline (ZOLOFT) 50 MG tablet Take 50 mg by mouth daily.     tiZANidine (ZANAFLEX) 4 MG tablet      No current facility-administered medications for this visit.    REVIEW OF SYSTEMS:   Review of Systems  Constitutional: Positive for malaise/fatigue.       No loss of appetite  HENT: Negative.   Eyes: Negative.   Respiratory: Negative.  Negative for shortness of breath.   Cardiovascular: Negative.  Negative for chest pain.  Gastrointestinal: Positive for constipation. Negative for abdominal pain and diarrhea.  Genitourinary: Negative.   Musculoskeletal: Negative.        No muscle spasms   Skin: Negative.   Neurological: Positive for tremors (hands and feet).       Pain in feet at night  Endo/Heme/Allergies: Negative.   Psychiatric/Behavioral: Negative.   All other systems reviewed and are negative. 14 point review of systems was performed and is negative except as detailed under history of present illness and above  PHYSICAL EXAMINATION: ECOG PERFORMANCE STATUS: 2 - Symptomatic, <50% confined to bed  BP (!) 161/57 (BP Location: Left Arm, Patient Position: Sitting)    Pulse 61    Temp 97.7 F (36.5 C) (Oral)    Resp 20    Wt 135 lb 12.8 oz (61.6 kg)    SpO2 95%    BMI 22.60 kg/m   Physical Exam  Constitutional: She is oriented to person, place, and time and well-developed, well-nourished, and in no distress.  HENT:  Head:  Normocephalic and atraumatic.  Eyes: EOM are normal. Pupils are equal, round, and reactive to light. No scleral icterus.  Neck: Normal range of motion. Neck supple.  Cardiovascular: Normal rate, regular rhythm and normal heart sounds.   Pulmonary/Chest: Effort normal and breath sounds normal.  Abdominal: Soft. Bowel sounds are normal. She exhibits no distension and no mass. There is no tenderness. There is no rebound and no guarding.  Musculoskeletal: Normal range of motion.  Lymphadenopathy:    She has no cervical adenopathy.  Neurological: She is alert and oriented to person, place, and time. No cranial nerve deficit.  Skin: Skin is warm and dry.  Psychiatric: Mood, memory, affect and judgment normal.  Nursing note and vitals reviewed.  LABORATORY DATA:  I have reviewed the data as listed  CBC Latest Ref Rng & Units 08/13/2016 07/09/2016 05/21/2016  WBC 4.0 - 10.5 K/uL 5.8 5.6 6.3  Hemoglobin 12.0 - 15.0 g/dL 11.0(L) 10.6(L) 10.1(L)  Hematocrit 36.0 - 46.0 % 33.1(L) 32.0(L) 30.3(L)  Platelets 150 - 400 K/uL 327 332 259   CMP Latest Ref Rng & Units 08/13/2016 07/09/2016 05/21/2016  Glucose 65 - 99 mg/dL 126(H) 135(H) 151(H)  BUN 6 - 20 mg/dL 16 19 19   Creatinine 0.44 - 1.00 mg/dL 1.01(H) 1.09(H) 1.02(H)  Sodium 135 - 145 mmol/L 134(L) 132(L) 133(L)  Potassium 3.5 - 5.1 mmol/L 4.0 4.4 4.1  Chloride 101 - 111 mmol/L 101 99(L) 96(L)  CO2 22 - 32 mmol/L 27 25 29   Calcium 8.9 - 10.3 mg/dL 8.4(L) 8.4(L) 8.9  Total Protein 6.5 - 8.1 g/dL 5.6(L) 5.5(L) 5.5(L)  Total Bilirubin 0.3 - 1.2 mg/dL 0.3 0.8 0.6  Alkaline Phos 38 - 126 U/L 50 62 57  AST 15 - 41 U/L 15 18 18   ALT 14 - 54 U/L 10(L)  10(L) 10(L)        ASSESSMENT & PLAN:  IgA kappa myeloma Normocytic anemia HX iron deficiency HX IV iron replacement Polymyalgia rheumatica   83 year old female with multiple medical comorbidities currently residing in assisted living at [facility] diagnosed with multiple myeloma Intolerant of  dexamethasone secondary to headaches. Intolerant to revlimid secondary to severe back spasms. Intolerant to velcade due to muscle spasms.  PLAN: I have reviewed her labs with her today.   I recommended to continue close observation at this time off treatment. Patient had a lot of side effects from her prior treatments that were very debilitating for her. I do not believe she would be a good candidate for treatment in the future just because of all the side effects that she's had.  We will continue observing her labs, should there be an uptrend in her M-spike with worsening CRAB features then we can re-address whether she wants to resume treatment. Patient agreed to this plan.  She will return for follow up in 3 months with labs with CBC, CMP, SPEP, kappa/lambda light chains, beta 2 microglobulin.     All of the patients questions were answered with apparent satisfaction. The patient knows to call the clinic with any problems, questions or concerns.  This document serves as a record of services personally performed by Twana First, MD. It was created on her behalf by Martinique Casey, a trained medical scribe. The creation of this record is based on the scribe's personal observations and the provider's statements to them. This document has been checked and approved by the attending provider.  I have reviewed the above documentation for accuracy and completeness and I agree with the above.  Twana First MD 08/13/2016 3:29 PM

## 2016-08-13 NOTE — Patient Instructions (Addendum)
[place] at [facility] Discharge Instructions  RECOMMENDATIONS MADE BY THE CONSULTANT AND ANY TEST RESULTS WILL BE SENT TO YOUR REFERRING PHYSICIAN.  You were seen today by Dr. Twana First Follow up in 3 months with lab work one week before    Thank you for choosing [facility] at [facility] to provide your oncology and hematology care.  To afford each patient quality time with our provider, please arrive at least 15 minutes before your scheduled appointment time.    If you have a lab appointment with the [facility] please come in thru the  Main Entrance and check in at the main information desk  You need to re-schedule your appointment should you arrive 10 or more minutes late.  We strive to give you quality time with our providers, and arriving late affects you and other patients whose appointments are after yours.  Also, if you no show three or more times for appointments you may be dismissed from the clinic at the providers discretion.     Again, thank you for choosing [facility].  Our hope is that these requests will decrease the amount of time that you wait before being seen by our physicians.       _____________________________________________________________  Should you have questions after your visit to [facility], please contact our office at [phone] between the hours of 8:30 a.m. and 4:30 p.m.  Voicemails left after 4:30 p.m. will not be returned until the following business day.  For prescription refill requests, have your pharmacy contact our office.       Resources For Cancer Patients and their Caregivers ? American Cancer Society: Can assist with transportation, wigs, general needs, runs Look Good Feel Better.        (780)812-5934 ? Cancer Care: Provides financial assistance, online support groups, medication/co-pay assistance.  1-800-813-HOPE 571-210-4092) ? [facility] Assists [place] cancer patients and their families through emotional , educational and financial support.  272-278-4451 ? [place] DSS Where to apply for food stamps, Medicaid and utility assistance. (206)795-4011 ? [facility]: Transportation to medical appointments. 574 578 7812 ? Social Security Administration: May apply for disability if have a Stage IV cancer. (205)196-9753 (971)394-7891 ? [employer], Disability and Transit Services: Assists with nutrition, care and transit needs. [place] Support Programs: @10RELATIVEDAYS @ > Cancer Support Group  2nd Tuesday of the month 1pm-2pm, Journey Room  > Creative Journey  3rd Tuesday of the month 1130am-1pm, Journey Room  > Look Good Feel Better  1st Wednesday of the month 10am-12 noon, Journey Room (Call [place] to register [phone])

## 2016-08-15 DIAGNOSIS — G894 Chronic pain syndrome: Secondary | ICD-10-CM | POA: Diagnosis not present

## 2016-08-23 DIAGNOSIS — I739 Peripheral vascular disease, unspecified: Secondary | ICD-10-CM | POA: Diagnosis not present

## 2016-08-23 DIAGNOSIS — F419 Anxiety disorder, unspecified: Secondary | ICD-10-CM | POA: Diagnosis not present

## 2016-08-23 DIAGNOSIS — B351 Tinea unguium: Secondary | ICD-10-CM | POA: Diagnosis not present

## 2016-08-23 DIAGNOSIS — L110 Acquired keratosis follicularis: Secondary | ICD-10-CM | POA: Diagnosis not present

## 2016-09-03 ENCOUNTER — Encounter (HOSPITAL_COMMUNITY): Payer: Self-pay | Admitting: Lab

## 2016-09-03 NOTE — Progress Notes (Unsigned)
Referral to [place] to Dr Osborne Casco per family request.  Ok to make referral per [facility]

## 2016-09-05 DIAGNOSIS — R5383 Other fatigue: Secondary | ICD-10-CM | POA: Diagnosis not present

## 2016-09-05 DIAGNOSIS — C9000 Multiple myeloma not having achieved remission: Secondary | ICD-10-CM | POA: Diagnosis not present

## 2016-09-05 DIAGNOSIS — D509 Iron deficiency anemia, unspecified: Secondary | ICD-10-CM | POA: Diagnosis not present

## 2016-09-05 DIAGNOSIS — S91301A Unspecified open wound, right foot, initial encounter: Secondary | ICD-10-CM | POA: Diagnosis not present

## 2016-09-05 DIAGNOSIS — K5901 Slow transit constipation: Secondary | ICD-10-CM | POA: Diagnosis not present

## 2016-09-05 DIAGNOSIS — K219 Gastro-esophageal reflux disease without esophagitis: Secondary | ICD-10-CM | POA: Diagnosis not present

## 2016-09-05 DIAGNOSIS — G894 Chronic pain syndrome: Secondary | ICD-10-CM | POA: Diagnosis not present

## 2016-09-05 DIAGNOSIS — M353 Polymyalgia rheumatica: Secondary | ICD-10-CM | POA: Diagnosis not present

## 2016-09-05 DIAGNOSIS — E039 Hypothyroidism, unspecified: Secondary | ICD-10-CM | POA: Diagnosis not present

## 2016-09-05 DIAGNOSIS — J309 Allergic rhinitis, unspecified: Secondary | ICD-10-CM | POA: Diagnosis not present

## 2016-09-05 DIAGNOSIS — I10 Essential (primary) hypertension: Secondary | ICD-10-CM | POA: Diagnosis not present

## 2016-09-05 DIAGNOSIS — Z7982 Long term (current) use of aspirin: Secondary | ICD-10-CM | POA: Diagnosis not present

## 2016-09-06 DIAGNOSIS — R5383 Other fatigue: Secondary | ICD-10-CM | POA: Diagnosis not present

## 2016-09-12 DIAGNOSIS — G894 Chronic pain syndrome: Secondary | ICD-10-CM | POA: Diagnosis not present

## 2016-09-26 DIAGNOSIS — I10 Essential (primary) hypertension: Secondary | ICD-10-CM | POA: Diagnosis not present

## 2016-09-26 DIAGNOSIS — E039 Hypothyroidism, unspecified: Secondary | ICD-10-CM | POA: Diagnosis not present

## 2016-09-27 DIAGNOSIS — F419 Anxiety disorder, unspecified: Secondary | ICD-10-CM | POA: Diagnosis not present

## 2016-09-28 ENCOUNTER — Ambulatory Visit (INDEPENDENT_AMBULATORY_CARE_PROVIDER_SITE_OTHER): Payer: Medicare Other | Admitting: Internal Medicine

## 2016-09-28 ENCOUNTER — Encounter: Payer: Self-pay | Admitting: Internal Medicine

## 2016-09-28 VITALS — BP 104/58 | HR 60 | Ht 62.0 in | Wt 133.8 lb

## 2016-09-28 DIAGNOSIS — R001 Bradycardia, unspecified: Secondary | ICD-10-CM | POA: Diagnosis not present

## 2016-09-28 DIAGNOSIS — I495 Sick sinus syndrome: Secondary | ICD-10-CM

## 2016-09-28 DIAGNOSIS — Z950 Presence of cardiac pacemaker: Secondary | ICD-10-CM | POA: Diagnosis not present

## 2016-09-28 LAB — CUP PACEART INCLINIC DEVICE CHECK
Battery Remaining Longevity: 103 mo
Brady Statistic RA Percent Paced: 60 %
Brady Statistic RV Percent Paced: 0.01 %
Date Time Interrogation Session: 20180608085645
Implantable Lead Implant Date: 20130214
Implantable Lead Location: 753860
Implantable Pulse Generator Implant Date: 20130214
Lead Channel Impedance Value: 362.5 Ohm
Lead Channel Impedance Value: 362.5 Ohm
Lead Channel Pacing Threshold Amplitude: 1.25 V
Lead Channel Pacing Threshold Pulse Width: 0.5 ms
Lead Channel Pacing Threshold Pulse Width: 0.5 ms
Lead Channel Sensing Intrinsic Amplitude: 12 mV
Lead Channel Setting Pacing Amplitude: 2 V
Lead Channel Setting Pacing Amplitude: 2.5 V
Lead Channel Setting Pacing Pulse Width: 0.5 ms
MDC IDC LEAD IMPLANT DT: 20130214
MDC IDC LEAD LOCATION: 753859
MDC IDC MSMT BATTERY VOLTAGE: 2.93 V
MDC IDC MSMT LEADCHNL RA PACING THRESHOLD AMPLITUDE: 0.75 V
MDC IDC MSMT LEADCHNL RA SENSING INTR AMPL: 3.4 mV
MDC IDC MSMT LEADCHNL RV IMPEDANCE VALUE: 412.5 Ohm
MDC IDC MSMT LEADCHNL RV IMPEDANCE VALUE: 412.5 Ohm
MDC IDC PG SERIAL: 7316836
MDC IDC SET LEADCHNL RV SENSING SENSITIVITY: 2 mV
Pulse Gen Model: 2210

## 2016-09-28 NOTE — Addendum Note (Signed)
Addended by: Thressa Sheller on: 09/28/2016 12:55 PM   Modules accepted: Orders

## 2016-09-28 NOTE — Patient Instructions (Signed)
Medication Instructions:  Your physician recommends that you continue on your current medications as directed. Please refer to the Current Medication list given to you today.   Labwork: None Ordered   Testing/Procedures: None Ordered   Follow-Up: Your physician wants you to follow-up in: 1 year with Dr. Lovena Le. You will receive a reminder letter in the mail two months in advance. If you don't receive a letter, please call our office to schedule the follow-up appointment.  Remote monitoring is used to monitor your Pacemaker or ICD from home. This monitoring reduces the number of office visits required to check your device to one time per year. It allows [place] to keep an eye on the functioning of your device to ensure it is working properly. You are scheduled for a device check from home on  12/31/16. You may send your transmission at any time that day. If you have a wireless device, the transmission will be sent automatically. After your physician reviews your transmission, you will receive a postcard with your next transmission date.    Any Other Special Instructions Will Be Listed Below (If Applicable).     If you need a refill on your cardiac medications before your next appointment, please call your pharmacy.

## 2016-09-28 NOTE — Progress Notes (Signed)
HPI  .[name] returns today for followup. She is a very pleasant 83-year-old woman with a history of hypertension, palpitations, symptomatic bradycardia, status post permanent pacemaker insertion. She has been stable in the past year except for back pain. She has had 3 vertebralplasties. Minimal edema. No syncope. No chest pain or sob.  Allergies  Allergen Reactions  . Tramadol Itching and Nausea Only  . Sulfa Antibiotics Nausea And Vomiting     Current Outpatient Prescriptions  Medication Sig Dispense Refill  . acetaminophen (TYLENOL) 500 MG tablet Take 1,000 mg by mouth every 6 (six) hours as needed for mild pain.     . acyclovir (ZOVIRAX) 400 MG tablet Take 1 tablet (400 mg total) by mouth 2 (two) times daily. 60 tablet 5  . ALPRAZolam (XANAX) 0.25 MG tablet Take 0.25 mg by mouth at bedtime as needed for sleep.    . amLODipine (NORVASC) 5 MG tablet Take 5 mg by mouth at bedtime.     . aspirin 325 MG tablet Take 81 mg by mouth daily.     . Calcium Carbonate Antacid (CAL-GEST ANTACID PO) Take by mouth as directed.     . carvedilol (COREG) 6.25 MG tablet Take 1 tablet (6.25 mg total) by mouth 2 (two) times daily with a meal. 60 tablet 0  . CVS SENNA PLUS 8.6-50 MG tablet Take 1 tablet by mouth 2 (two) times daily.  0  . cycloSPORINE (RESTASIS) 0.05 % ophthalmic emulsion Place 1 drop into both eyes 2 (two) times daily. *Self Administration*    . diphenhydrAMINE (BENADRYL) 25 mg capsule Take 1 capsule (25 mg total) by mouth every 6 (six) hours as needed. Start at the first sign/symptom of rash 30 capsule 1  . diphenhydrAMINE (BENYLIN) 12.5 MG/5ML syrup Take 5 mLs (12.5 mg total) by mouth 4 (four) times daily as needed for itching or allergies. 120 mL 0  . docusate sodium (COLACE) 100 MG capsule Take 100 mg by mouth at bedtime. For constipation    . Ferrous Sulfate Dried (FERROUS SULFATE CR PO) Take by mouth as directed.     . fluticasone (FLONASE) 50 MCG/ACT nasal spray Place 2 sprays into  the nose daily. For allergies    . gabapentin (NEURONTIN) 100 MG capsule Take by mouth as directed.     . hydrALAZINE (APRESOLINE) 10 MG tablet Take 10 mg by mouth 2 (two) times daily.    . hydrocortisone (ANUSOL-HC) 2.5 % rectal cream Place 1 application rectally 2 (two) times daily.    . hydrocortisone cream 0.5 % Apply 1 application topically 2 (two) times daily. 30 g 1  . Hypertonic Nasal Wash (SINUS RINSE NA) Place 1 application into both nostrils daily.    . levothyroxine (SYNTHROID, LEVOTHROID) 75 MCG tablet Take 75 mcg by mouth daily before breakfast.     . loperamide (IMODIUM) 2 MG capsule Take 2 mg by mouth every 4 (four) hours as needed for diarrhea or loose stools.    . loratadine (CLARITIN) 10 MG tablet Take 10 mg by mouth daily.    . magnesium hydroxide (MILK OF MAGNESIA) 400 MG/5ML suspension Take 0.5 mLs by mouth as needed for mild constipation (Take as directed).     . metaxalone (SKELAXIN) 800 MG tablet Take 1 tablet (800 mg total) by mouth 3 (three) times daily. 30 tablet 1  . methylPREDNISolone (MEDROL DOSEPAK) 4 MG TBPK tablet Take as prescribed 21 tablet 0  . multivitamin-lutein (OCUVITE-LUTEIN) CAPS capsule Take 1 capsule by mouth 2 (two)   times daily.    Marland Kitchen omeprazole (PRILOSEC) 20 MG capsule Take 20 mg by mouth daily. Reported on 06/25/2015    . ondansetron (ZOFRAN) 8 MG tablet Take 1 tablet (8 mg total) by mouth every 8 (eight) hours as needed for nausea or vomiting. 20 tablet 1  . oxyCODONE-acetaminophen (PERCOCET/ROXICET) 5-325 MG tablet Take 1 tablet by mouth every 6 (six) hours as needed for severe pain. 30 tablet 0  . polyethylene glycol (MIRALAX / GLYCOLAX) packet Take 17 g by mouth daily.    . predniSONE (DELTASONE) 5 MG tablet Take 5 mg by mouth daily with breakfast.    . prochlorperazine (COMPAZINE) 10 MG tablet Take 1 tablet (10 mg total) by mouth every 6 (six) hours as needed for nausea or vomiting. 30 tablet 1  . sertraline (ZOLOFT) 50 MG tablet Take 50 mg by  mouth daily.    Marland Kitchen tiZANidine (ZANAFLEX) 4 MG tablet Take by mouth as directed.      No current facility-administered medications for this visit.      Past Medical History:  Diagnosis Date  . Anemia   . Bradycardia   . Constipation   . Degenerative joint disease   . Diverticulosis   . External hemorrhoids   . GERD (gastroesophageal reflux disease)   . Goiter, unspecified   . Hyperlipidemia   . Hypertension   . Hypothyroidism   . Macular degeneration   . Multiple myeloma ([place]) 03/12/2016  . Osteoporosis   . Pacemaker   . Palpitations   . Seasonal allergies   . Thyrotoxicosis without mention of goiter or other cause, without mention of thyrotoxic crisis or storm     ROS:   All systems reviewed and negative except as noted in the HPI.   Past Surgical History:  Procedure Laterality Date  . COLONOSCOPY  November 2011   External hemorrhoids, diverticulosis, anal papillae  . COLONOSCOPY N/A 04/01/2014   Procedure: COLONOSCOPY;  Surgeon: Rogene Houston, MD;  [facility];  Service: Endoscopy;  Laterality: N/A;  930  . FEMUR IM NAIL Left 12/29/2012   Procedure: INTRAMEDULLARY (IM) NAIL INTERTROCHANTRIC;  Surgeon: Melina Schools, MD;  Location: [place];  Service: Orthopedics;  Laterality: Left;  . INSERT / REPLACE / REMOVE PACEMAKER    . PERMANENT PACEMAKER INSERTION N/A 06/07/2011   Procedure: PERMANENT PACEMAKER INSERTION;  Surgeon: Evans Lance, MD;  Location: [facility] CATH LAB;  Service: Cardiovascular;  Laterality: N/A;  . THYROIDECTOMY    . TONSILLECTOMY    . vertebroplasty       Family History  Problem Relation Age of Onset  . Heart attack Mother        Deceased  . Cancer Father        Deceased, colon cancer age 15  . Cancer Brother        Deceased, throat and lung     Social History   Social History  . Marital status: Widowed    Spouse name: N/A  . Number of children: N/A  . Years of education: N/A   Occupational History  . Not on file.   Social  History Main Topics  . Smoking status: Never Smoker  . Smokeless tobacco: Never Used  . Alcohol use No  . Drug use: No  . Sexual activity: No   Other Topics Concern  . Not on file   Social History Narrative  . No narrative on file     BP (!) 104/58   Pulse 60  Ht 5' 2" (1.575 m)   Wt 133 lb 12.8 oz (60.7 kg)   SpO2 95%   BMI 24.47 kg/m   Physical Exam:  Well appearing 83 year old woman,NAD HEENT: Unremarkable Neck:  7 cm JVD, no thyromegally Lungs:  Clear with no wheezes, rales, or rhonchi. HEART:  Regular rate rhythm, no murmurs, no rubs, no clicks Abd:  soft, positive bowel sounds, no organomegally, no rebound, no guarding Ext:  2 plus pulses, no edema, no cyanosis, no clubbing Skin:  No rashes no nodules Neuro:  CN II through XII intact, motor grossly intact  DEVICE  Normal device function.  See PaceArt for details.   Assess/Plan: 1. Sinus node dysfunction - she is doing very well, s/p PPM insertion 2. HTN - her blood pressure is well controlled today. If it remains low, will might stop her amlodipine. 3. PPM - her [employer] DDD PM is working normally. 7 years of battery longevity remains. Will recheck in several months.  4. Atrial fib - she has had no atrial fib. At this point, no indication for systemic anti-coagulation.   Mikle Bosworth.D.

## 2016-10-01 DIAGNOSIS — C9000 Multiple myeloma not having achieved remission: Secondary | ICD-10-CM | POA: Diagnosis not present

## 2016-10-03 DIAGNOSIS — H6120 Impacted cerumen, unspecified ear: Secondary | ICD-10-CM | POA: Diagnosis not present

## 2016-10-03 DIAGNOSIS — M353 Polymyalgia rheumatica: Secondary | ICD-10-CM | POA: Diagnosis not present

## 2016-10-03 DIAGNOSIS — R5383 Other fatigue: Secondary | ICD-10-CM | POA: Diagnosis not present

## 2016-10-03 DIAGNOSIS — D509 Iron deficiency anemia, unspecified: Secondary | ICD-10-CM | POA: Diagnosis not present

## 2016-10-03 DIAGNOSIS — S91301A Unspecified open wound, right foot, initial encounter: Secondary | ICD-10-CM | POA: Diagnosis not present

## 2016-10-03 DIAGNOSIS — J309 Allergic rhinitis, unspecified: Secondary | ICD-10-CM | POA: Diagnosis not present

## 2016-10-03 DIAGNOSIS — E039 Hypothyroidism, unspecified: Secondary | ICD-10-CM | POA: Diagnosis not present

## 2016-10-03 DIAGNOSIS — I10 Essential (primary) hypertension: Secondary | ICD-10-CM | POA: Diagnosis not present

## 2016-10-03 DIAGNOSIS — Z7982 Long term (current) use of aspirin: Secondary | ICD-10-CM | POA: Diagnosis not present

## 2016-10-03 DIAGNOSIS — K219 Gastro-esophageal reflux disease without esophagitis: Secondary | ICD-10-CM | POA: Diagnosis not present

## 2016-10-03 DIAGNOSIS — K5901 Slow transit constipation: Secondary | ICD-10-CM | POA: Diagnosis not present

## 2016-10-03 DIAGNOSIS — C9000 Multiple myeloma not having achieved remission: Secondary | ICD-10-CM | POA: Diagnosis not present

## 2016-10-08 DIAGNOSIS — C9000 Multiple myeloma not having achieved remission: Secondary | ICD-10-CM | POA: Diagnosis not present

## 2016-10-09 DIAGNOSIS — Z79899 Other long term (current) drug therapy: Secondary | ICD-10-CM | POA: Diagnosis not present

## 2016-10-10 DIAGNOSIS — H35352 Cystoid macular degeneration, left eye: Secondary | ICD-10-CM | POA: Diagnosis not present

## 2016-10-10 DIAGNOSIS — H353222 Exudative age-related macular degeneration, left eye, with inactive choroidal neovascularization: Secondary | ICD-10-CM | POA: Diagnosis not present

## 2016-10-10 DIAGNOSIS — G894 Chronic pain syndrome: Secondary | ICD-10-CM | POA: Diagnosis not present

## 2016-10-10 DIAGNOSIS — H35351 Cystoid macular degeneration, right eye: Secondary | ICD-10-CM | POA: Diagnosis not present

## 2016-10-10 DIAGNOSIS — H353134 Nonexudative age-related macular degeneration, bilateral, advanced atrophic with subfoveal involvement: Secondary | ICD-10-CM | POA: Diagnosis not present

## 2016-10-10 DIAGNOSIS — H353212 Exudative age-related macular degeneration, right eye, with inactive choroidal neovascularization: Secondary | ICD-10-CM | POA: Diagnosis not present

## 2016-10-12 DIAGNOSIS — Z7951 Long term (current) use of inhaled steroids: Secondary | ICD-10-CM | POA: Diagnosis not present

## 2016-10-12 DIAGNOSIS — Z950 Presence of cardiac pacemaker: Secondary | ICD-10-CM | POA: Diagnosis not present

## 2016-10-12 DIAGNOSIS — I10 Essential (primary) hypertension: Secondary | ICD-10-CM | POA: Diagnosis not present

## 2016-10-12 DIAGNOSIS — Z79891 Long term (current) use of opiate analgesic: Secondary | ICD-10-CM | POA: Diagnosis not present

## 2016-10-12 DIAGNOSIS — M353 Polymyalgia rheumatica: Secondary | ICD-10-CM | POA: Diagnosis not present

## 2016-10-12 DIAGNOSIS — Z7952 Long term (current) use of systemic steroids: Secondary | ICD-10-CM | POA: Diagnosis not present

## 2016-10-12 DIAGNOSIS — Z9181 History of falling: Secondary | ICD-10-CM | POA: Diagnosis not present

## 2016-10-12 DIAGNOSIS — C9000 Multiple myeloma not having achieved remission: Secondary | ICD-10-CM | POA: Diagnosis not present

## 2016-10-12 DIAGNOSIS — H9190 Unspecified hearing loss, unspecified ear: Secondary | ICD-10-CM | POA: Diagnosis not present

## 2016-10-18 DIAGNOSIS — I10 Essential (primary) hypertension: Secondary | ICD-10-CM | POA: Diagnosis not present

## 2016-10-18 DIAGNOSIS — Z79891 Long term (current) use of opiate analgesic: Secondary | ICD-10-CM | POA: Diagnosis not present

## 2016-10-18 DIAGNOSIS — M353 Polymyalgia rheumatica: Secondary | ICD-10-CM | POA: Diagnosis not present

## 2016-10-18 DIAGNOSIS — H9190 Unspecified hearing loss, unspecified ear: Secondary | ICD-10-CM | POA: Diagnosis not present

## 2016-10-18 DIAGNOSIS — C9000 Multiple myeloma not having achieved remission: Secondary | ICD-10-CM | POA: Diagnosis not present

## 2016-10-18 DIAGNOSIS — Z7952 Long term (current) use of systemic steroids: Secondary | ICD-10-CM | POA: Diagnosis not present

## 2016-10-19 DIAGNOSIS — M353 Polymyalgia rheumatica: Secondary | ICD-10-CM | POA: Diagnosis not present

## 2016-10-19 DIAGNOSIS — Z79891 Long term (current) use of opiate analgesic: Secondary | ICD-10-CM | POA: Diagnosis not present

## 2016-10-19 DIAGNOSIS — Z7952 Long term (current) use of systemic steroids: Secondary | ICD-10-CM | POA: Diagnosis not present

## 2016-10-19 DIAGNOSIS — I10 Essential (primary) hypertension: Secondary | ICD-10-CM | POA: Diagnosis not present

## 2016-10-19 DIAGNOSIS — H9190 Unspecified hearing loss, unspecified ear: Secondary | ICD-10-CM | POA: Diagnosis not present

## 2016-10-19 DIAGNOSIS — C9000 Multiple myeloma not having achieved remission: Secondary | ICD-10-CM | POA: Diagnosis not present

## 2016-10-22 DIAGNOSIS — C9000 Multiple myeloma not having achieved remission: Secondary | ICD-10-CM | POA: Diagnosis not present

## 2016-10-22 DIAGNOSIS — I10 Essential (primary) hypertension: Secondary | ICD-10-CM | POA: Diagnosis not present

## 2016-10-22 DIAGNOSIS — Z7952 Long term (current) use of systemic steroids: Secondary | ICD-10-CM | POA: Diagnosis not present

## 2016-10-22 DIAGNOSIS — Z79891 Long term (current) use of opiate analgesic: Secondary | ICD-10-CM | POA: Diagnosis not present

## 2016-10-22 DIAGNOSIS — M353 Polymyalgia rheumatica: Secondary | ICD-10-CM | POA: Diagnosis not present

## 2016-10-22 DIAGNOSIS — H9190 Unspecified hearing loss, unspecified ear: Secondary | ICD-10-CM | POA: Diagnosis not present

## 2016-10-25 DIAGNOSIS — F419 Anxiety disorder, unspecified: Secondary | ICD-10-CM | POA: Diagnosis not present

## 2016-10-25 DIAGNOSIS — Z7952 Long term (current) use of systemic steroids: Secondary | ICD-10-CM | POA: Diagnosis not present

## 2016-10-25 DIAGNOSIS — H9190 Unspecified hearing loss, unspecified ear: Secondary | ICD-10-CM | POA: Diagnosis not present

## 2016-10-25 DIAGNOSIS — C9000 Multiple myeloma not having achieved remission: Secondary | ICD-10-CM | POA: Diagnosis not present

## 2016-10-25 DIAGNOSIS — M353 Polymyalgia rheumatica: Secondary | ICD-10-CM | POA: Diagnosis not present

## 2016-10-25 DIAGNOSIS — Z79891 Long term (current) use of opiate analgesic: Secondary | ICD-10-CM | POA: Diagnosis not present

## 2016-10-25 DIAGNOSIS — I10 Essential (primary) hypertension: Secondary | ICD-10-CM | POA: Diagnosis not present

## 2016-10-29 DIAGNOSIS — I10 Essential (primary) hypertension: Secondary | ICD-10-CM | POA: Diagnosis not present

## 2016-10-29 DIAGNOSIS — Z7952 Long term (current) use of systemic steroids: Secondary | ICD-10-CM | POA: Diagnosis not present

## 2016-10-29 DIAGNOSIS — M353 Polymyalgia rheumatica: Secondary | ICD-10-CM | POA: Diagnosis not present

## 2016-10-29 DIAGNOSIS — E58 Dietary calcium deficiency: Secondary | ICD-10-CM | POA: Diagnosis not present

## 2016-10-29 DIAGNOSIS — C9000 Multiple myeloma not having achieved remission: Secondary | ICD-10-CM | POA: Diagnosis not present

## 2016-10-29 DIAGNOSIS — E559 Vitamin D deficiency, unspecified: Secondary | ICD-10-CM | POA: Diagnosis not present

## 2016-10-29 DIAGNOSIS — K5900 Constipation, unspecified: Secondary | ICD-10-CM | POA: Diagnosis not present

## 2016-10-29 DIAGNOSIS — Z79891 Long term (current) use of opiate analgesic: Secondary | ICD-10-CM | POA: Diagnosis not present

## 2016-10-29 DIAGNOSIS — H9190 Unspecified hearing loss, unspecified ear: Secondary | ICD-10-CM | POA: Diagnosis not present

## 2016-10-31 DIAGNOSIS — Z7952 Long term (current) use of systemic steroids: Secondary | ICD-10-CM | POA: Diagnosis not present

## 2016-10-31 DIAGNOSIS — N390 Urinary tract infection, site not specified: Secondary | ICD-10-CM | POA: Diagnosis not present

## 2016-10-31 DIAGNOSIS — H9190 Unspecified hearing loss, unspecified ear: Secondary | ICD-10-CM | POA: Diagnosis not present

## 2016-10-31 DIAGNOSIS — M353 Polymyalgia rheumatica: Secondary | ICD-10-CM | POA: Diagnosis not present

## 2016-10-31 DIAGNOSIS — C9000 Multiple myeloma not having achieved remission: Secondary | ICD-10-CM | POA: Diagnosis not present

## 2016-10-31 DIAGNOSIS — I10 Essential (primary) hypertension: Secondary | ICD-10-CM | POA: Diagnosis not present

## 2016-10-31 DIAGNOSIS — Z79891 Long term (current) use of opiate analgesic: Secondary | ICD-10-CM | POA: Diagnosis not present

## 2016-11-05 ENCOUNTER — Other Ambulatory Visit (HOSPITAL_COMMUNITY): Payer: Medicare Other

## 2016-11-05 DIAGNOSIS — H9190 Unspecified hearing loss, unspecified ear: Secondary | ICD-10-CM | POA: Diagnosis not present

## 2016-11-05 DIAGNOSIS — M353 Polymyalgia rheumatica: Secondary | ICD-10-CM | POA: Diagnosis not present

## 2016-11-05 DIAGNOSIS — J309 Allergic rhinitis, unspecified: Secondary | ICD-10-CM | POA: Diagnosis not present

## 2016-11-05 DIAGNOSIS — G894 Chronic pain syndrome: Secondary | ICD-10-CM | POA: Diagnosis not present

## 2016-11-05 DIAGNOSIS — Z79891 Long term (current) use of opiate analgesic: Secondary | ICD-10-CM | POA: Diagnosis not present

## 2016-11-05 DIAGNOSIS — I10 Essential (primary) hypertension: Secondary | ICD-10-CM | POA: Diagnosis not present

## 2016-11-05 DIAGNOSIS — C9000 Multiple myeloma not having achieved remission: Secondary | ICD-10-CM | POA: Diagnosis not present

## 2016-11-05 DIAGNOSIS — Z7952 Long term (current) use of systemic steroids: Secondary | ICD-10-CM | POA: Diagnosis not present

## 2016-11-08 DIAGNOSIS — M353 Polymyalgia rheumatica: Secondary | ICD-10-CM | POA: Diagnosis not present

## 2016-11-08 DIAGNOSIS — C9000 Multiple myeloma not having achieved remission: Secondary | ICD-10-CM | POA: Diagnosis not present

## 2016-11-08 DIAGNOSIS — I10 Essential (primary) hypertension: Secondary | ICD-10-CM | POA: Diagnosis not present

## 2016-11-08 DIAGNOSIS — Z7952 Long term (current) use of systemic steroids: Secondary | ICD-10-CM | POA: Diagnosis not present

## 2016-11-08 DIAGNOSIS — Z79891 Long term (current) use of opiate analgesic: Secondary | ICD-10-CM | POA: Diagnosis not present

## 2016-11-08 DIAGNOSIS — H9190 Unspecified hearing loss, unspecified ear: Secondary | ICD-10-CM | POA: Diagnosis not present

## 2016-11-12 ENCOUNTER — Ambulatory Visit (HOSPITAL_COMMUNITY): Payer: Medicare Other

## 2016-11-12 DIAGNOSIS — Z79891 Long term (current) use of opiate analgesic: Secondary | ICD-10-CM | POA: Diagnosis not present

## 2016-11-12 DIAGNOSIS — C9000 Multiple myeloma not having achieved remission: Secondary | ICD-10-CM | POA: Diagnosis not present

## 2016-11-12 DIAGNOSIS — I10 Essential (primary) hypertension: Secondary | ICD-10-CM | POA: Diagnosis not present

## 2016-11-12 DIAGNOSIS — Z7952 Long term (current) use of systemic steroids: Secondary | ICD-10-CM | POA: Diagnosis not present

## 2016-11-12 DIAGNOSIS — M353 Polymyalgia rheumatica: Secondary | ICD-10-CM | POA: Diagnosis not present

## 2016-11-12 DIAGNOSIS — H9190 Unspecified hearing loss, unspecified ear: Secondary | ICD-10-CM | POA: Diagnosis not present

## 2016-11-13 DIAGNOSIS — Z79899 Other long term (current) drug therapy: Secondary | ICD-10-CM | POA: Diagnosis not present

## 2016-11-15 DIAGNOSIS — Z79891 Long term (current) use of opiate analgesic: Secondary | ICD-10-CM | POA: Diagnosis not present

## 2016-11-15 DIAGNOSIS — M353 Polymyalgia rheumatica: Secondary | ICD-10-CM | POA: Diagnosis not present

## 2016-11-15 DIAGNOSIS — C9000 Multiple myeloma not having achieved remission: Secondary | ICD-10-CM | POA: Diagnosis not present

## 2016-11-15 DIAGNOSIS — Z7952 Long term (current) use of systemic steroids: Secondary | ICD-10-CM | POA: Diagnosis not present

## 2016-11-15 DIAGNOSIS — H9190 Unspecified hearing loss, unspecified ear: Secondary | ICD-10-CM | POA: Diagnosis not present

## 2016-11-15 DIAGNOSIS — I10 Essential (primary) hypertension: Secondary | ICD-10-CM | POA: Diagnosis not present

## 2016-11-19 DIAGNOSIS — Z7952 Long term (current) use of systemic steroids: Secondary | ICD-10-CM | POA: Diagnosis not present

## 2016-11-19 DIAGNOSIS — I10 Essential (primary) hypertension: Secondary | ICD-10-CM | POA: Diagnosis not present

## 2016-11-19 DIAGNOSIS — C9000 Multiple myeloma not having achieved remission: Secondary | ICD-10-CM | POA: Diagnosis not present

## 2016-11-19 DIAGNOSIS — M353 Polymyalgia rheumatica: Secondary | ICD-10-CM | POA: Diagnosis not present

## 2016-11-19 DIAGNOSIS — H9190 Unspecified hearing loss, unspecified ear: Secondary | ICD-10-CM | POA: Diagnosis not present

## 2016-11-19 DIAGNOSIS — Z79891 Long term (current) use of opiate analgesic: Secondary | ICD-10-CM | POA: Diagnosis not present

## 2016-11-20 DIAGNOSIS — F419 Anxiety disorder, unspecified: Secondary | ICD-10-CM | POA: Diagnosis not present

## 2016-11-26 DIAGNOSIS — R2681 Unsteadiness on feet: Secondary | ICD-10-CM | POA: Diagnosis not present

## 2016-11-26 DIAGNOSIS — M8580 Other specified disorders of bone density and structure, unspecified site: Secondary | ICD-10-CM | POA: Diagnosis not present

## 2016-11-26 DIAGNOSIS — S22058D Other fracture of T5-T6 vertebra, subsequent encounter for fracture with routine healing: Secondary | ICD-10-CM | POA: Diagnosis not present

## 2016-11-26 DIAGNOSIS — Z8781 Personal history of (healed) traumatic fracture: Secondary | ICD-10-CM | POA: Diagnosis not present

## 2016-11-26 DIAGNOSIS — S22078D Other fracture of T9-T10 vertebra, subsequent encounter for fracture with routine healing: Secondary | ICD-10-CM | POA: Diagnosis not present

## 2016-11-26 DIAGNOSIS — Z5112 Encounter for antineoplastic immunotherapy: Secondary | ICD-10-CM | POA: Diagnosis not present

## 2016-11-26 DIAGNOSIS — C9000 Multiple myeloma not having achieved remission: Secondary | ICD-10-CM | POA: Diagnosis not present

## 2016-11-26 DIAGNOSIS — S22068D Other fracture of T7-T8 thoracic vertebra, subsequent encounter for fracture with routine healing: Secondary | ICD-10-CM | POA: Diagnosis not present

## 2016-11-28 DIAGNOSIS — M79674 Pain in right toe(s): Secondary | ICD-10-CM | POA: Diagnosis not present

## 2016-11-28 DIAGNOSIS — C9000 Multiple myeloma not having achieved remission: Secondary | ICD-10-CM | POA: Diagnosis not present

## 2016-11-28 DIAGNOSIS — H9190 Unspecified hearing loss, unspecified ear: Secondary | ICD-10-CM | POA: Diagnosis not present

## 2016-11-28 DIAGNOSIS — L110 Acquired keratosis follicularis: Secondary | ICD-10-CM | POA: Diagnosis not present

## 2016-11-28 DIAGNOSIS — Z7952 Long term (current) use of systemic steroids: Secondary | ICD-10-CM | POA: Diagnosis not present

## 2016-11-28 DIAGNOSIS — M353 Polymyalgia rheumatica: Secondary | ICD-10-CM | POA: Diagnosis not present

## 2016-11-28 DIAGNOSIS — Z79891 Long term (current) use of opiate analgesic: Secondary | ICD-10-CM | POA: Diagnosis not present

## 2016-11-28 DIAGNOSIS — I739 Peripheral vascular disease, unspecified: Secondary | ICD-10-CM | POA: Diagnosis not present

## 2016-11-28 DIAGNOSIS — B351 Tinea unguium: Secondary | ICD-10-CM | POA: Diagnosis not present

## 2016-11-28 DIAGNOSIS — I10 Essential (primary) hypertension: Secondary | ICD-10-CM | POA: Diagnosis not present

## 2016-12-04 DIAGNOSIS — F419 Anxiety disorder, unspecified: Secondary | ICD-10-CM | POA: Diagnosis not present

## 2016-12-10 DIAGNOSIS — M353 Polymyalgia rheumatica: Secondary | ICD-10-CM | POA: Diagnosis not present

## 2016-12-10 DIAGNOSIS — G894 Chronic pain syndrome: Secondary | ICD-10-CM | POA: Diagnosis not present

## 2016-12-17 DIAGNOSIS — Z950 Presence of cardiac pacemaker: Secondary | ICD-10-CM | POA: Diagnosis not present

## 2016-12-17 DIAGNOSIS — H3530 Unspecified macular degeneration: Secondary | ICD-10-CM | POA: Diagnosis not present

## 2016-12-17 DIAGNOSIS — M353 Polymyalgia rheumatica: Secondary | ICD-10-CM | POA: Diagnosis not present

## 2016-12-19 DIAGNOSIS — F419 Anxiety disorder, unspecified: Secondary | ICD-10-CM | POA: Diagnosis not present

## 2016-12-19 DIAGNOSIS — R2681 Unsteadiness on feet: Secondary | ICD-10-CM | POA: Diagnosis not present

## 2016-12-26 DIAGNOSIS — F419 Anxiety disorder, unspecified: Secondary | ICD-10-CM | POA: Diagnosis not present

## 2016-12-31 ENCOUNTER — Telehealth: Payer: Self-pay | Admitting: Cardiology

## 2016-12-31 ENCOUNTER — Encounter: Payer: Medicare Other | Admitting: *Deleted

## 2016-12-31 DIAGNOSIS — K5900 Constipation, unspecified: Secondary | ICD-10-CM | POA: Diagnosis not present

## 2016-12-31 DIAGNOSIS — M549 Dorsalgia, unspecified: Secondary | ICD-10-CM | POA: Diagnosis not present

## 2016-12-31 DIAGNOSIS — Z08 Encounter for follow-up examination after completed treatment for malignant neoplasm: Secondary | ICD-10-CM | POA: Diagnosis not present

## 2016-12-31 DIAGNOSIS — C9000 Multiple myeloma not having achieved remission: Secondary | ICD-10-CM | POA: Diagnosis not present

## 2016-12-31 DIAGNOSIS — G8929 Other chronic pain: Secondary | ICD-10-CM | POA: Diagnosis not present

## 2016-12-31 NOTE — Telephone Encounter (Signed)
LMOVM reminding pt to send remote transmission.   

## 2017-01-04 ENCOUNTER — Encounter: Payer: Self-pay | Admitting: Cardiology

## 2017-01-07 DIAGNOSIS — M353 Polymyalgia rheumatica: Secondary | ICD-10-CM | POA: Diagnosis not present

## 2017-01-07 DIAGNOSIS — G894 Chronic pain syndrome: Secondary | ICD-10-CM | POA: Diagnosis not present

## 2017-01-09 DIAGNOSIS — E039 Hypothyroidism, unspecified: Secondary | ICD-10-CM | POA: Diagnosis not present

## 2017-01-09 DIAGNOSIS — I10 Essential (primary) hypertension: Secondary | ICD-10-CM | POA: Diagnosis not present

## 2017-01-09 DIAGNOSIS — D508 Other iron deficiency anemias: Secondary | ICD-10-CM | POA: Diagnosis not present

## 2017-01-10 DIAGNOSIS — L821 Other seborrheic keratosis: Secondary | ICD-10-CM | POA: Diagnosis not present

## 2017-01-10 DIAGNOSIS — L728 Other follicular cysts of the skin and subcutaneous tissue: Secondary | ICD-10-CM | POA: Diagnosis not present

## 2017-01-10 DIAGNOSIS — D225 Melanocytic nevi of trunk: Secondary | ICD-10-CM | POA: Diagnosis not present

## 2017-01-11 DIAGNOSIS — D509 Iron deficiency anemia, unspecified: Secondary | ICD-10-CM | POA: Diagnosis not present

## 2017-01-11 DIAGNOSIS — H3530 Unspecified macular degeneration: Secondary | ICD-10-CM | POA: Diagnosis not present

## 2017-01-11 DIAGNOSIS — R5383 Other fatigue: Secondary | ICD-10-CM | POA: Diagnosis not present

## 2017-01-11 DIAGNOSIS — E039 Hypothyroidism, unspecified: Secondary | ICD-10-CM | POA: Diagnosis not present

## 2017-01-11 DIAGNOSIS — I10 Essential (primary) hypertension: Secondary | ICD-10-CM | POA: Diagnosis not present

## 2017-01-11 DIAGNOSIS — K5900 Constipation, unspecified: Secondary | ICD-10-CM | POA: Diagnosis not present

## 2017-01-11 DIAGNOSIS — G894 Chronic pain syndrome: Secondary | ICD-10-CM | POA: Diagnosis not present

## 2017-01-11 DIAGNOSIS — H6120 Impacted cerumen, unspecified ear: Secondary | ICD-10-CM | POA: Diagnosis not present

## 2017-01-11 DIAGNOSIS — H049 Disorder of lacrimal system, unspecified: Secondary | ICD-10-CM | POA: Diagnosis not present

## 2017-01-11 DIAGNOSIS — K210 Gastro-esophageal reflux disease with esophagitis: Secondary | ICD-10-CM | POA: Diagnosis not present

## 2017-01-11 DIAGNOSIS — Z950 Presence of cardiac pacemaker: Secondary | ICD-10-CM | POA: Diagnosis not present

## 2017-01-11 DIAGNOSIS — M353 Polymyalgia rheumatica: Secondary | ICD-10-CM | POA: Diagnosis not present

## 2017-01-14 DIAGNOSIS — Z950 Presence of cardiac pacemaker: Secondary | ICD-10-CM | POA: Diagnosis not present

## 2017-01-14 DIAGNOSIS — H3530 Unspecified macular degeneration: Secondary | ICD-10-CM | POA: Diagnosis not present

## 2017-01-14 DIAGNOSIS — M353 Polymyalgia rheumatica: Secondary | ICD-10-CM | POA: Diagnosis not present

## 2017-01-14 DIAGNOSIS — R300 Dysuria: Secondary | ICD-10-CM | POA: Diagnosis not present

## 2017-01-16 DIAGNOSIS — M79675 Pain in left toe(s): Secondary | ICD-10-CM | POA: Diagnosis not present

## 2017-01-16 DIAGNOSIS — L600 Ingrowing nail: Secondary | ICD-10-CM | POA: Diagnosis not present

## 2017-01-16 DIAGNOSIS — R2681 Unsteadiness on feet: Secondary | ICD-10-CM | POA: Diagnosis not present

## 2017-01-16 DIAGNOSIS — F419 Anxiety disorder, unspecified: Secondary | ICD-10-CM | POA: Diagnosis not present

## 2017-01-16 DIAGNOSIS — M79672 Pain in left foot: Secondary | ICD-10-CM | POA: Diagnosis not present

## 2017-01-17 DIAGNOSIS — C9000 Multiple myeloma not having achieved remission: Secondary | ICD-10-CM | POA: Diagnosis not present

## 2017-01-17 DIAGNOSIS — K5904 Chronic idiopathic constipation: Secondary | ICD-10-CM | POA: Diagnosis not present

## 2017-01-17 DIAGNOSIS — M545 Low back pain: Secondary | ICD-10-CM | POA: Diagnosis not present

## 2017-01-17 DIAGNOSIS — E039 Hypothyroidism, unspecified: Secondary | ICD-10-CM | POA: Diagnosis not present

## 2017-01-17 DIAGNOSIS — G8929 Other chronic pain: Secondary | ICD-10-CM | POA: Diagnosis not present

## 2017-01-17 DIAGNOSIS — M25519 Pain in unspecified shoulder: Secondary | ICD-10-CM | POA: Diagnosis not present

## 2017-01-21 DIAGNOSIS — Z9849 Cataract extraction status, unspecified eye: Secondary | ICD-10-CM | POA: Diagnosis not present

## 2017-01-21 DIAGNOSIS — Z961 Presence of intraocular lens: Secondary | ICD-10-CM | POA: Diagnosis not present

## 2017-01-21 DIAGNOSIS — H3122 Choroidal dystrophy (central areolar) (generalized) (peripapillary): Secondary | ICD-10-CM | POA: Diagnosis not present

## 2017-01-24 DIAGNOSIS — R399 Unspecified symptoms and signs involving the genitourinary system: Secondary | ICD-10-CM | POA: Diagnosis not present

## 2017-01-28 DIAGNOSIS — Z5111 Encounter for antineoplastic chemotherapy: Secondary | ICD-10-CM | POA: Diagnosis not present

## 2017-01-28 DIAGNOSIS — Z79899 Other long term (current) drug therapy: Secondary | ICD-10-CM | POA: Diagnosis not present

## 2017-01-28 DIAGNOSIS — C9000 Multiple myeloma not having achieved remission: Secondary | ICD-10-CM | POA: Diagnosis not present

## 2017-02-11 DIAGNOSIS — C9000 Multiple myeloma not having achieved remission: Secondary | ICD-10-CM | POA: Diagnosis not present

## 2017-02-15 DIAGNOSIS — E039 Hypothyroidism, unspecified: Secondary | ICD-10-CM | POA: Diagnosis not present

## 2017-02-15 DIAGNOSIS — R5383 Other fatigue: Secondary | ICD-10-CM | POA: Diagnosis not present

## 2017-02-15 DIAGNOSIS — G4452 New daily persistent headache (NDPH): Secondary | ICD-10-CM | POA: Diagnosis not present

## 2017-02-15 DIAGNOSIS — C9000 Multiple myeloma not having achieved remission: Secondary | ICD-10-CM | POA: Diagnosis not present

## 2017-02-15 DIAGNOSIS — M545 Low back pain: Secondary | ICD-10-CM | POA: Diagnosis not present

## 2017-02-18 DIAGNOSIS — R300 Dysuria: Secondary | ICD-10-CM | POA: Diagnosis not present

## 2017-02-18 DIAGNOSIS — E039 Hypothyroidism, unspecified: Secondary | ICD-10-CM | POA: Diagnosis not present

## 2017-02-18 DIAGNOSIS — Z7982 Long term (current) use of aspirin: Secondary | ICD-10-CM | POA: Diagnosis not present

## 2017-02-18 DIAGNOSIS — C9000 Multiple myeloma not having achieved remission: Secondary | ICD-10-CM | POA: Diagnosis not present

## 2017-02-18 DIAGNOSIS — Z79899 Other long term (current) drug therapy: Secondary | ICD-10-CM | POA: Diagnosis not present

## 2017-02-18 DIAGNOSIS — I10 Essential (primary) hypertension: Secondary | ICD-10-CM | POA: Diagnosis not present

## 2017-02-18 DIAGNOSIS — Z882 Allergy status to sulfonamides status: Secondary | ICD-10-CM | POA: Diagnosis not present

## 2017-02-18 DIAGNOSIS — Z7902 Long term (current) use of antithrombotics/antiplatelets: Secondary | ICD-10-CM | POA: Diagnosis not present

## 2017-02-18 DIAGNOSIS — Z886 Allergy status to analgesic agent status: Secondary | ICD-10-CM | POA: Diagnosis not present

## 2017-02-20 DIAGNOSIS — M79672 Pain in left foot: Secondary | ICD-10-CM | POA: Diagnosis not present

## 2017-02-20 DIAGNOSIS — I739 Peripheral vascular disease, unspecified: Secondary | ICD-10-CM | POA: Diagnosis not present

## 2017-02-20 DIAGNOSIS — L110 Acquired keratosis follicularis: Secondary | ICD-10-CM | POA: Diagnosis not present

## 2017-02-20 DIAGNOSIS — M79671 Pain in right foot: Secondary | ICD-10-CM | POA: Diagnosis not present

## 2017-02-21 DIAGNOSIS — M545 Low back pain: Secondary | ICD-10-CM | POA: Diagnosis not present

## 2017-02-21 DIAGNOSIS — G4452 New daily persistent headache (NDPH): Secondary | ICD-10-CM | POA: Diagnosis not present

## 2017-02-21 DIAGNOSIS — E039 Hypothyroidism, unspecified: Secondary | ICD-10-CM | POA: Diagnosis not present

## 2017-02-21 DIAGNOSIS — K5904 Chronic idiopathic constipation: Secondary | ICD-10-CM | POA: Diagnosis not present

## 2017-02-21 DIAGNOSIS — M25511 Pain in right shoulder: Secondary | ICD-10-CM | POA: Diagnosis not present

## 2017-02-21 DIAGNOSIS — C9000 Multiple myeloma not having achieved remission: Secondary | ICD-10-CM | POA: Diagnosis not present

## 2017-02-21 DIAGNOSIS — W19XXXA Unspecified fall, initial encounter: Secondary | ICD-10-CM | POA: Diagnosis not present

## 2017-02-21 DIAGNOSIS — R0782 Intercostal pain: Secondary | ICD-10-CM | POA: Diagnosis not present

## 2017-02-21 DIAGNOSIS — R5383 Other fatigue: Secondary | ICD-10-CM | POA: Diagnosis not present

## 2017-02-21 DIAGNOSIS — R42 Dizziness and giddiness: Secondary | ICD-10-CM | POA: Diagnosis not present

## 2017-02-25 DIAGNOSIS — Z5111 Encounter for antineoplastic chemotherapy: Secondary | ICD-10-CM | POA: Diagnosis not present

## 2017-02-25 DIAGNOSIS — Z79899 Other long term (current) drug therapy: Secondary | ICD-10-CM | POA: Diagnosis not present

## 2017-02-25 DIAGNOSIS — C9000 Multiple myeloma not having achieved remission: Secondary | ICD-10-CM | POA: Diagnosis not present

## 2017-02-28 DIAGNOSIS — H353212 Exudative age-related macular degeneration, right eye, with inactive choroidal neovascularization: Secondary | ICD-10-CM | POA: Diagnosis not present

## 2017-02-28 DIAGNOSIS — H35351 Cystoid macular degeneration, right eye: Secondary | ICD-10-CM | POA: Diagnosis not present

## 2017-02-28 DIAGNOSIS — H353134 Nonexudative age-related macular degeneration, bilateral, advanced atrophic with subfoveal involvement: Secondary | ICD-10-CM | POA: Diagnosis not present

## 2017-02-28 DIAGNOSIS — H353222 Exudative age-related macular degeneration, left eye, with inactive choroidal neovascularization: Secondary | ICD-10-CM | POA: Diagnosis not present

## 2017-03-01 IMAGING — CT CT CHEST W/O CM
2 of 3 series · 13 of 36 positions shown, 16 images · non-contrast
Comparison: 06/25/2015

CLINICAL DATA: Chest pain around ribcage and upper back pain for
the past week

EXAM:
CT CHEST WITHOUT CONTRAST
TECHNIQUE: Multidetector CT imaging of the chest was performed following the
standard protocol without IV contrast.

[Series 201: chest without, idose (2) · axial · non-contrast · 0.63mm/px · z∈[+46,+306]mm · 10 of 62 slices shown, 13 images]
[im 5/62  mediastinal]
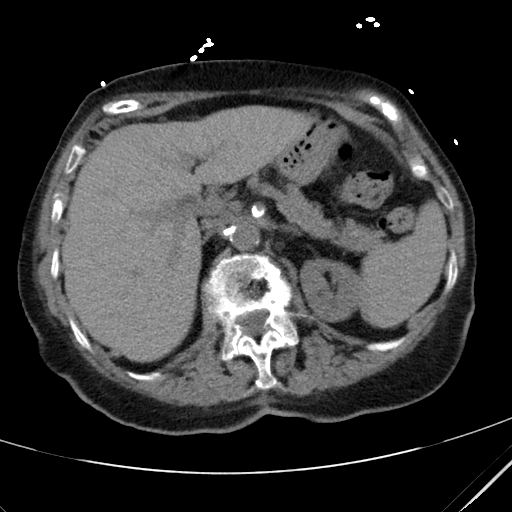
[im 5/62  lung]
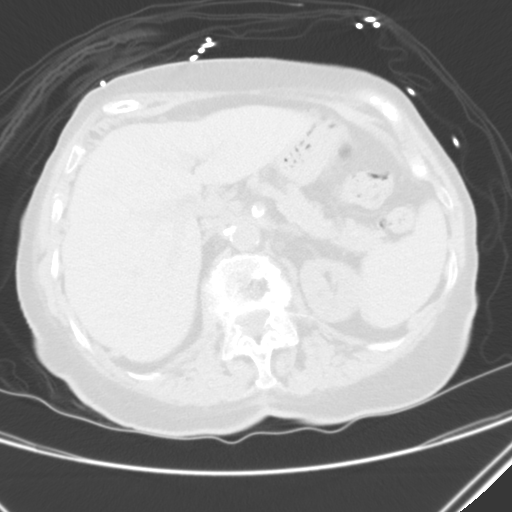
[im 10/62  lung]
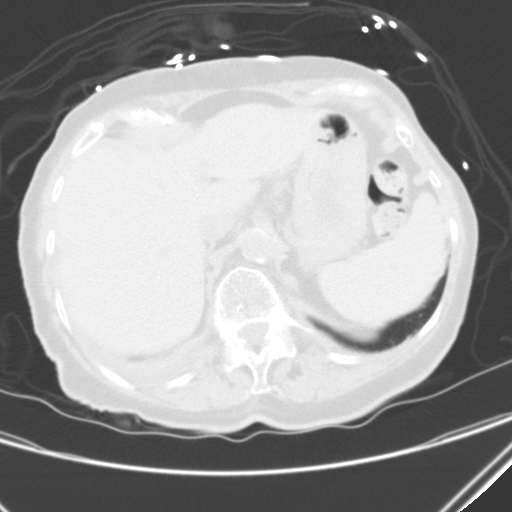
[im 16/62  lung]
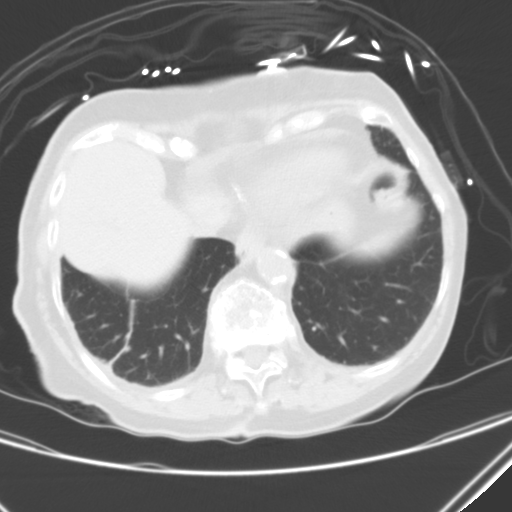
[im 23/62  lung]
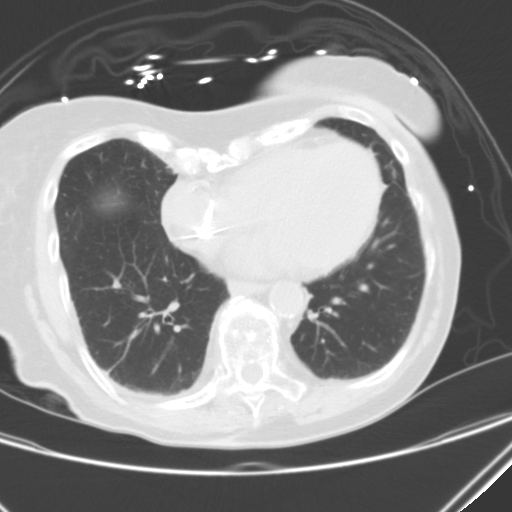
[im 28/62  mediastinal]
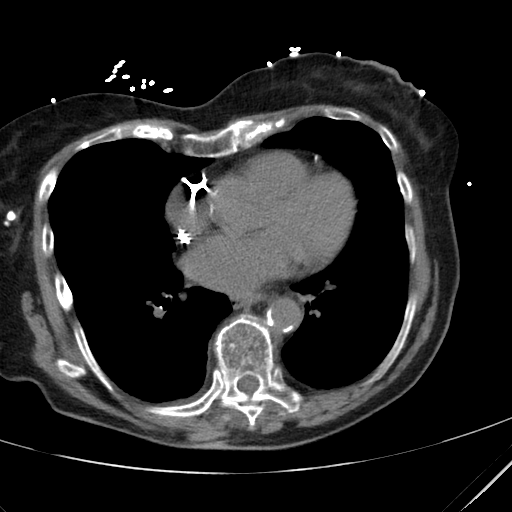
[im 28/62  lung]
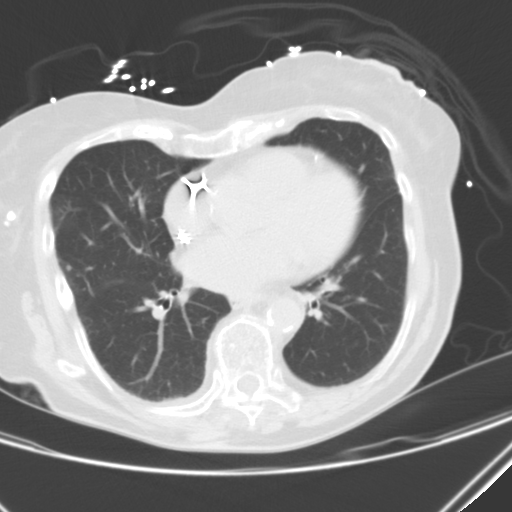
[im 34/62  lung]
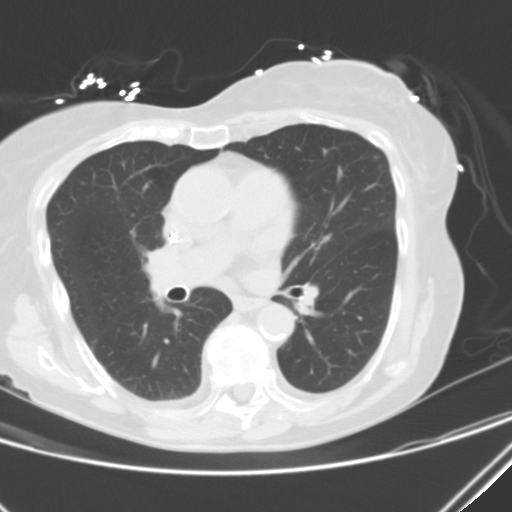
[im 39/62  lung]
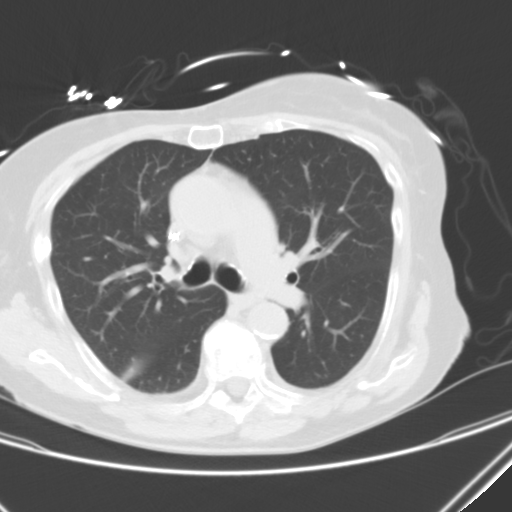
[im 46/62  lung]
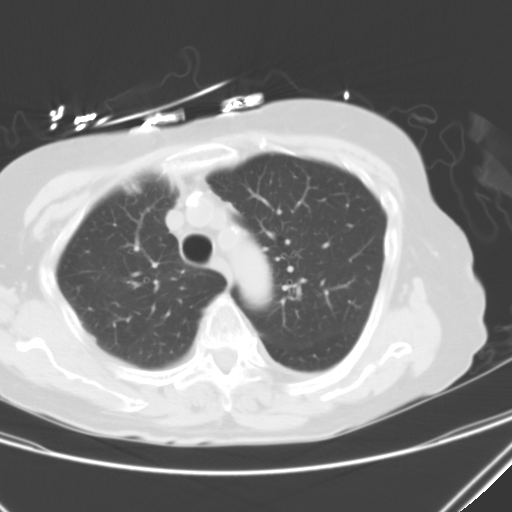
[im 52/62  mediastinal]
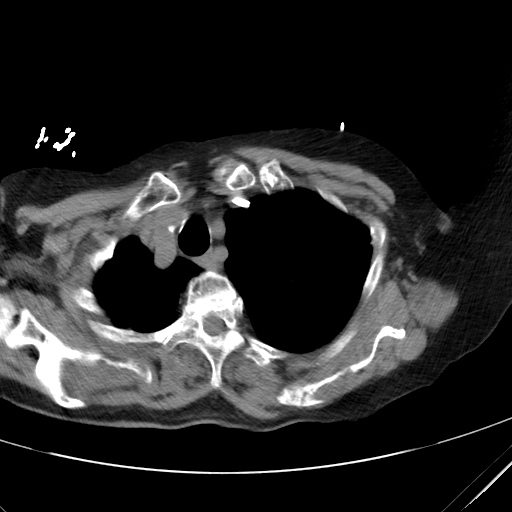
[im 52/62  lung]
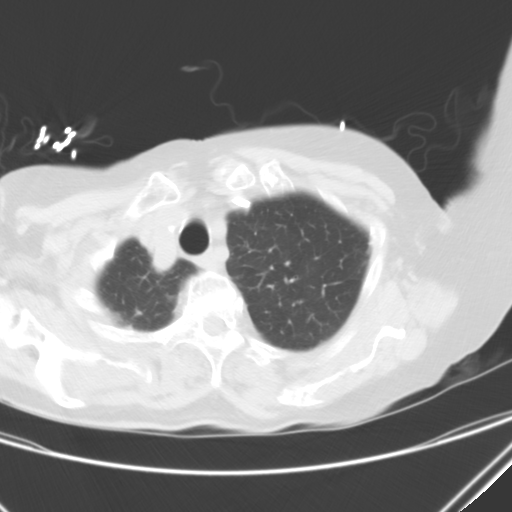
[im 57/62  lung]
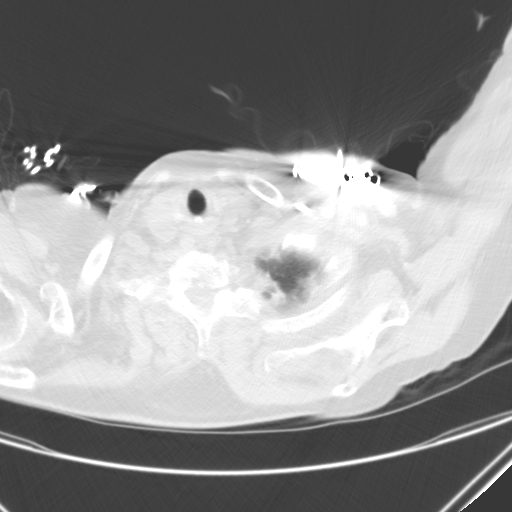

[Series 203: coronal, idose (2) · coronal · 0.45mm/px · 3 of 95 slices shown]
[im 19/95  lung]
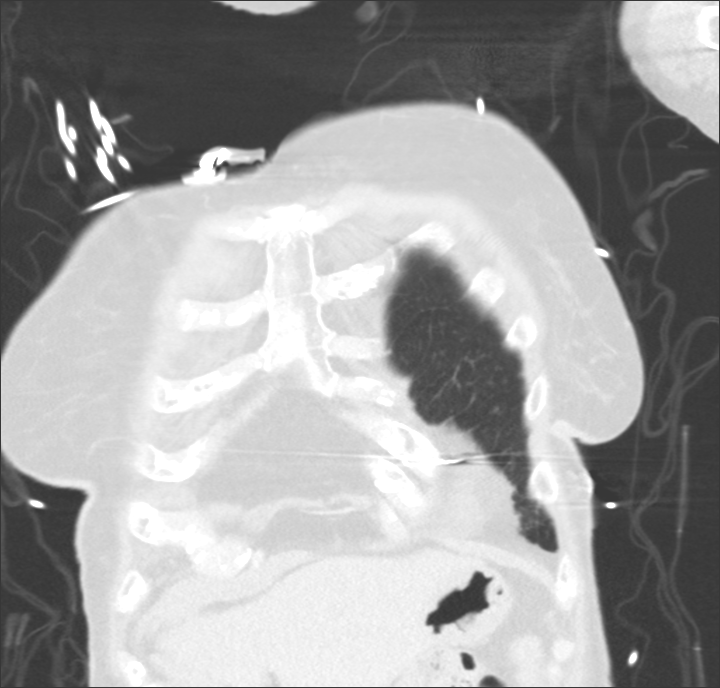
[im 38/95  lung]
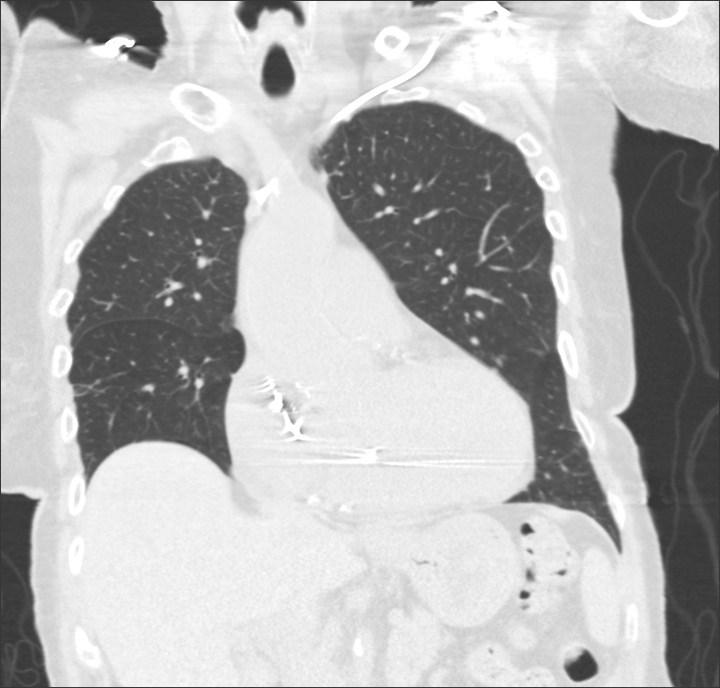
[im 57/95  lung]
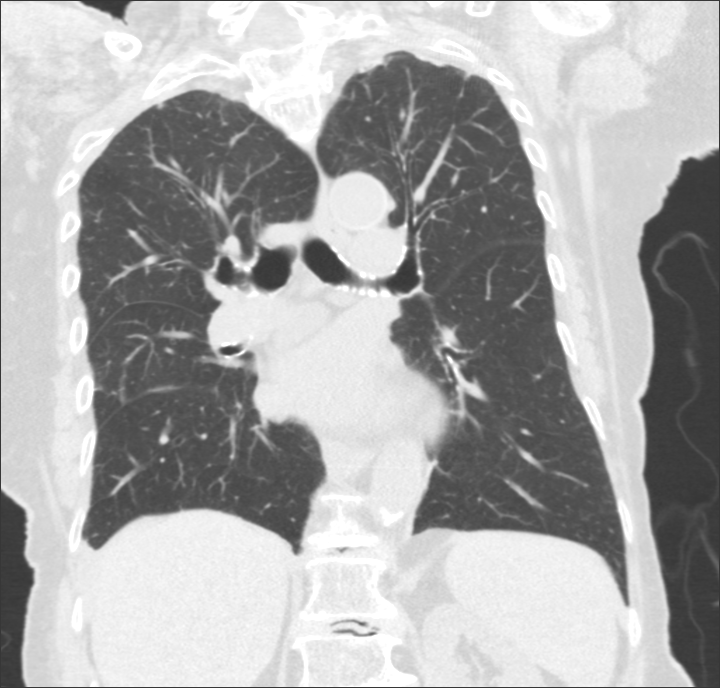

[13 of 36 positions shown; findings below may reference images not displayed]

FINDINGS: Mediastinum/Nodes: Cardiac pacer noted. Trace pericardial effusion.
Mild cardiac enlargement. Calcification of the thoracic aorta.

No significant hilar or mediastinal adenopathy.

Lungs/Pleura: Trace and possibly loculated right pleural effusion.

4 mm pulmonary nodule in the inferior right upper lobe based along
the minor fissure. 3 mm pulmonary nodule lateral right lower lobe
and 4 mm pulmonary nodule anterior inferior right middle lobe. Mild
discoid atelectasis right lower lobe

4 mm pulmonary nodule in the lingula image number 27. Left lung
otherwise clear.

Upper abdomen: No acute findings

Musculoskeletal: 50% T8 compression deformity with no retropulsion.
Approximate 25% T7 compression deformity with no retropulsion. Over
50% T6 compression deformity with no retropulsion. This level is
status post kyphoplasty. These levels appear unchanged from
06/25/2015.

There is significant degenerative disc disease in lower half of the
thoracic spine. Old anterior right fifth sixth and seventh rib
fractures. No acute rib fractures identified.
IMPRESSION: Multiple bilateral tiny pulmonary nodules. If the patient is at high
risk for bronchogenic carcinoma, follow-up chest CT at 1 year is
recommended. If the patient is at low risk, no follow-up is needed.
This recommendation follows the consensus statement: Guidelines for
Management of Small Pulmonary Nodules Detected on CT Scans: A
Statement from the [HOSPITAL] as published in Radiology
8884; [DATE].

Old right rib fractures. Trace loculated right pleural effusion or
chronic pleural thickening possibly related.

Compression deformities thoracic spine at three consecutive levels
appear without acute features and stable from 06/25/15, with the most
cephalad and severe status post kyphoplasty.

## 2017-03-02 IMAGING — XA IR VERTEBROPLASTY ADDL INJECTION
1 series · 14 of 24 positions shown · IV contrast (IODINE)
Comparison: none

INDICATION: Severe thoracic back pain secondary to compression fractures at T7
and T8.
TECHNIQUE: Informed written consent was obtained from the patient after a
thorough discussion of the procedural risks, benefits and
alternatives. All questions were addressed. Maximal Sterile Barrier
Technique was utilized including caps, mask, sterile gowns, sterile
gloves, sterile drape, hand hygiene and skin antiseptic. A timeout
was performed prior to the initiation of the procedure.

[Series 300: ir kypho ea addl level thoracic or lumba · 14 of 32 slices shown]
[im 1/32]
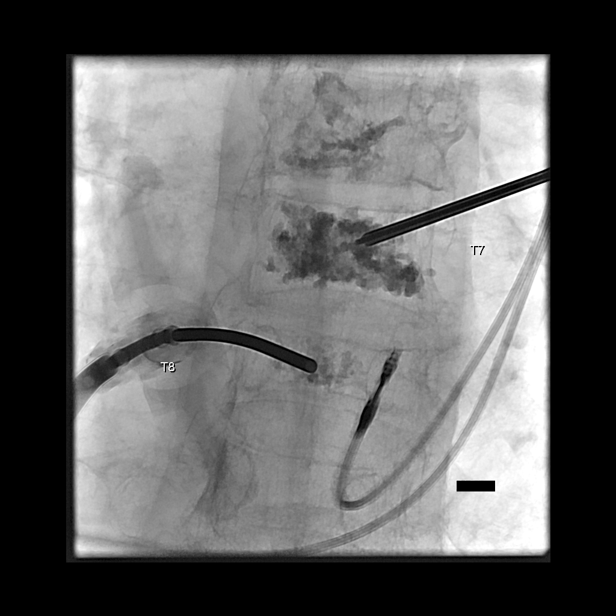
[im 3/32]
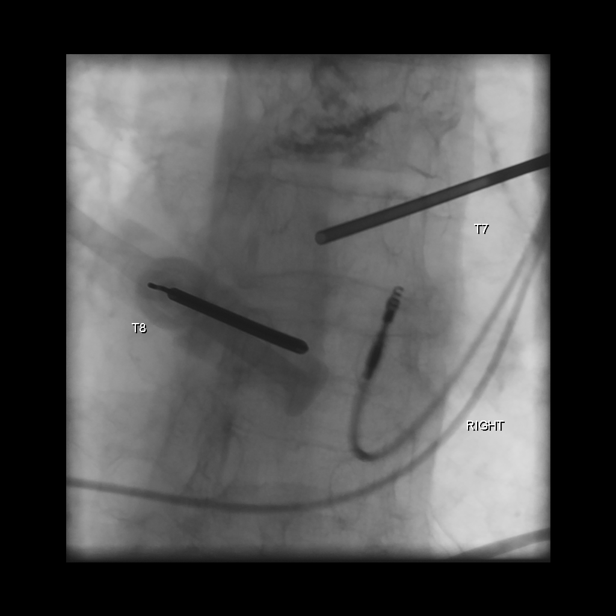
[im 6/32]
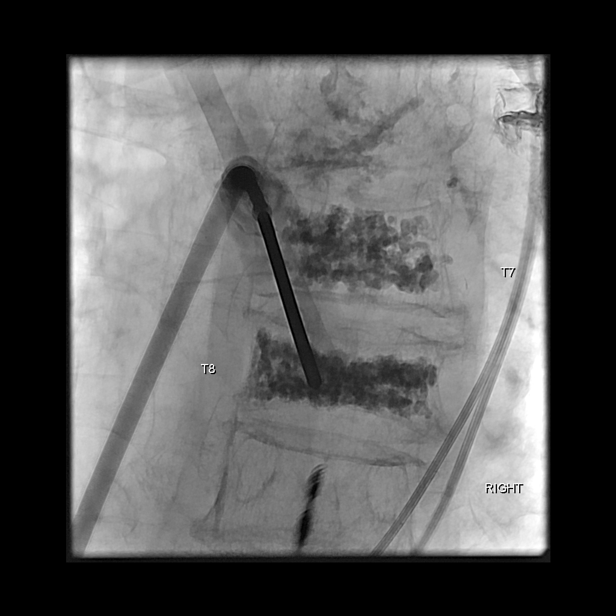
[im 9/32]
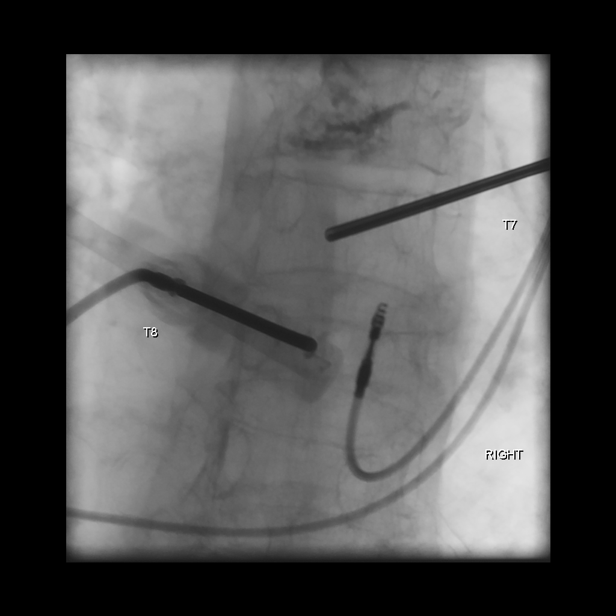
[im 10/32]
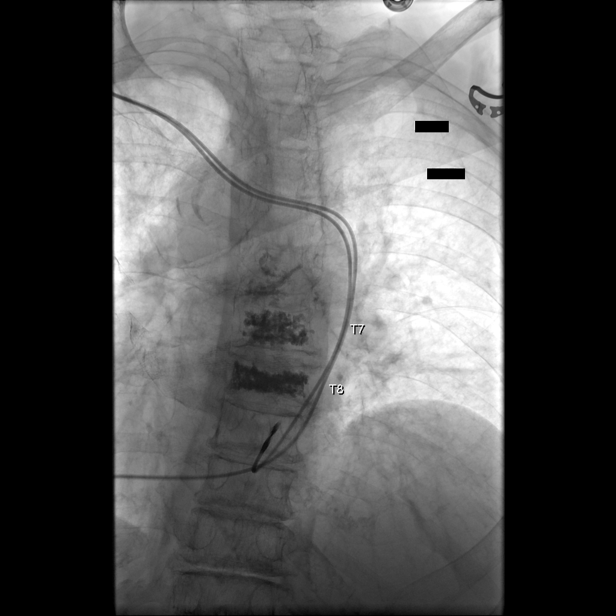
[im 13/32]
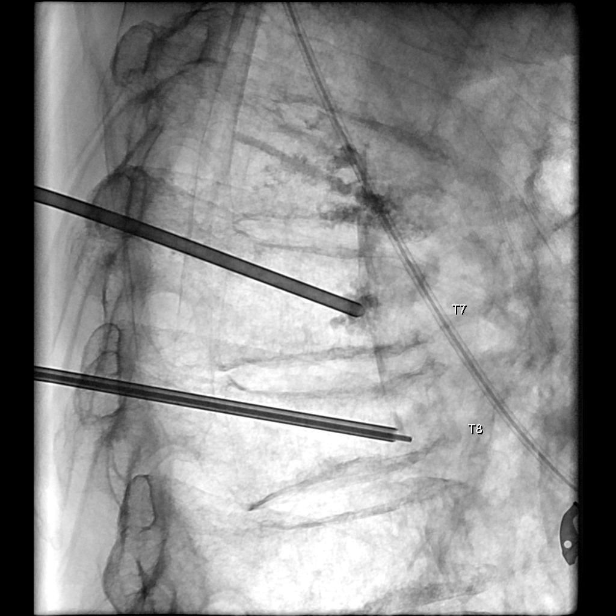
[im 15/32]
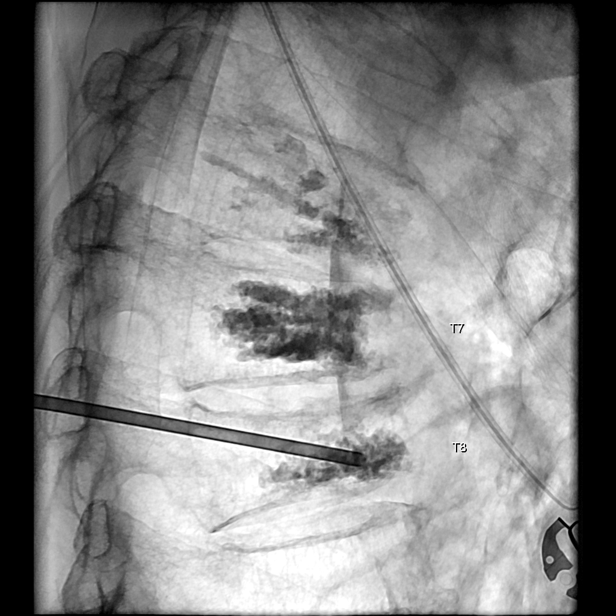
[im 17/32]
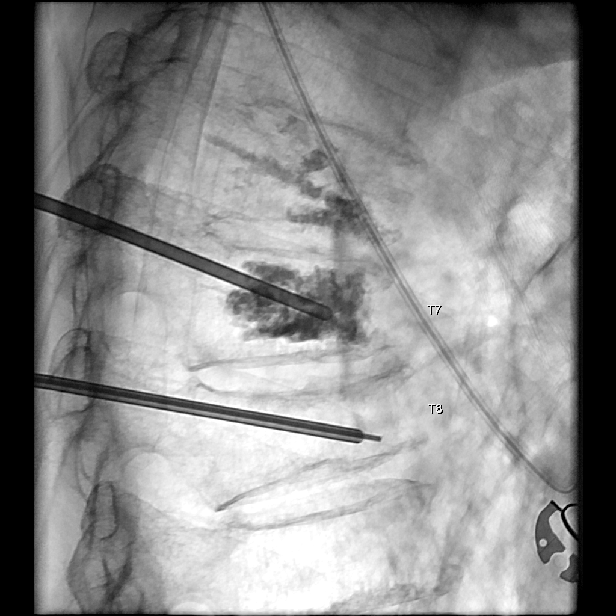
[im 19/32]
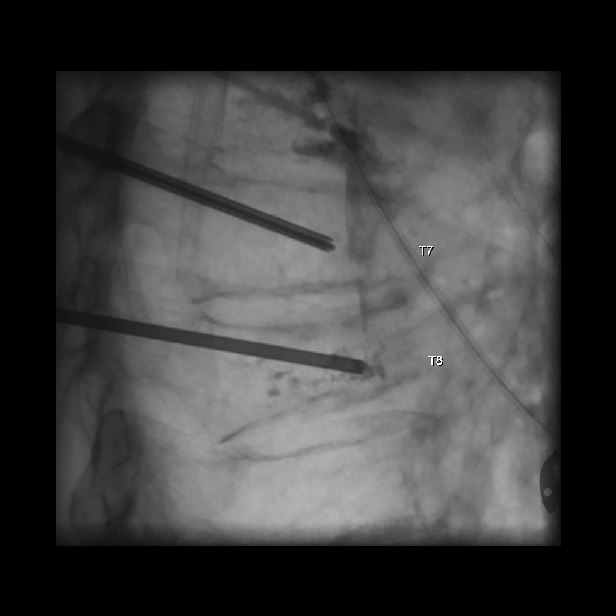
[im 22/32]
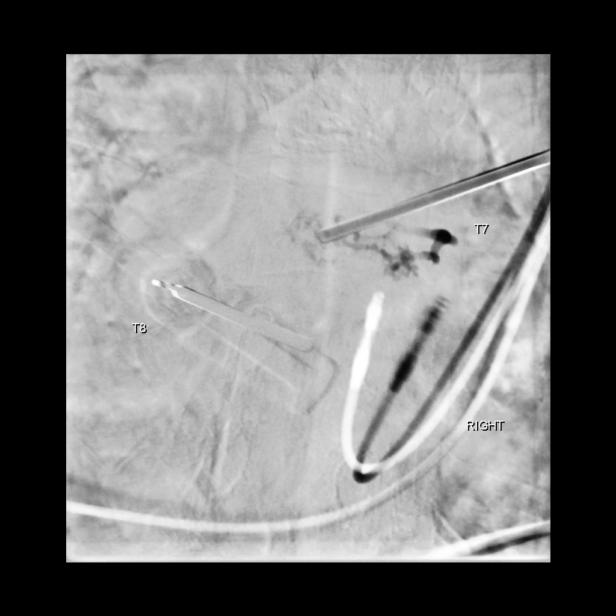
[im 25/32]
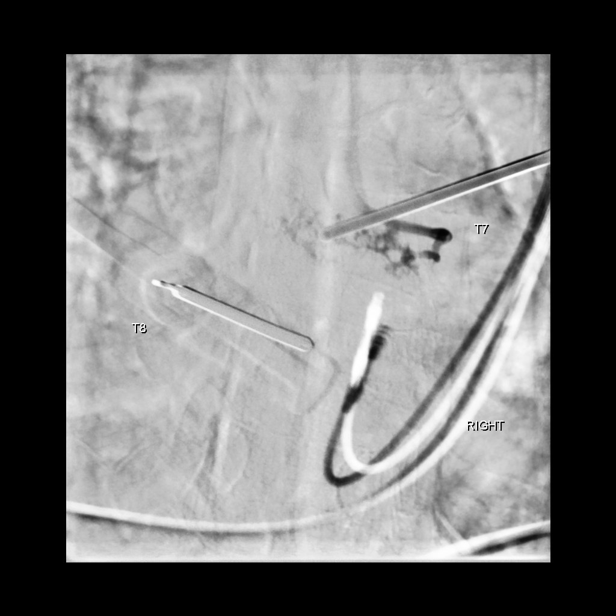
[im 26/32]
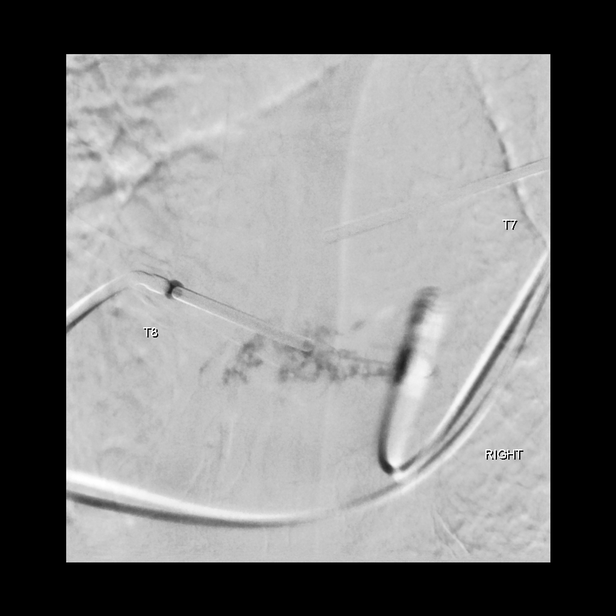
[im 29/32]
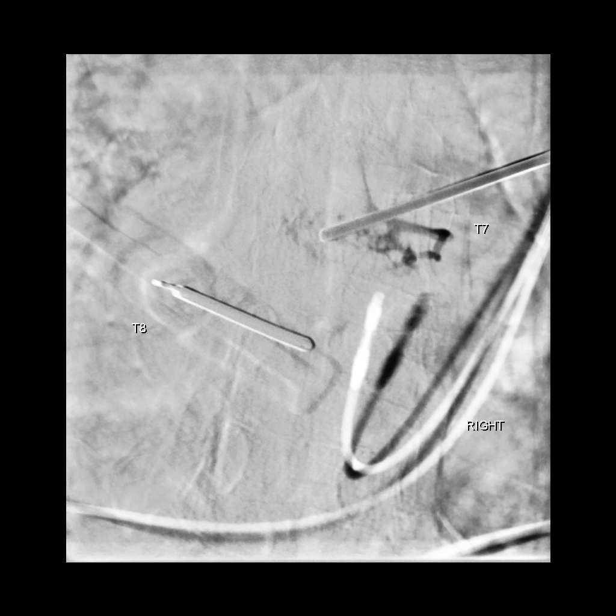
[im 32/32]
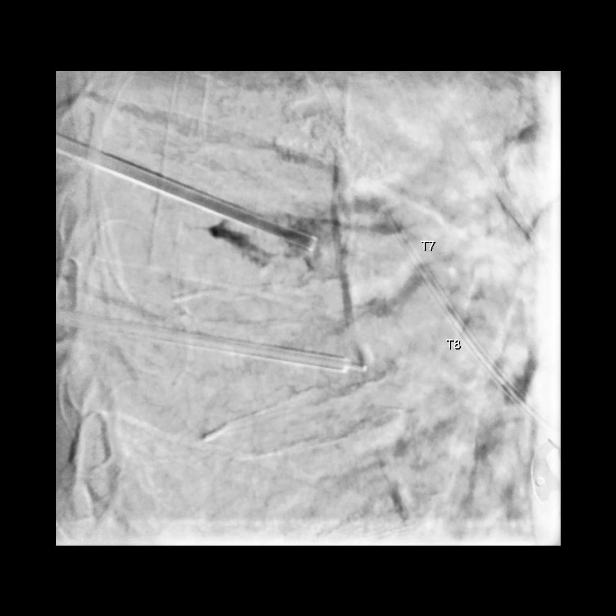

[14 of 24 positions shown; findings below may reference images not displayed]

EXAM:
IR VERTEBROPLASTY ADDL INJECTION; IR VERTEBROPLASTY CERVICOTHORACIC
INJ

MEDICATIONS:
As antibiotic prophylaxis, 2 g Ancef IV was ordered pre-procedure
and administered intravenously within 1 hour of incision.

ANESTHESIA/SEDATION:
Moderate (conscious) sedation was employed during this procedure. A
total of Versed 2 mg and Fentanyl 66.5 mcg was administered
intravenously.

Moderate Sedation Time: 30 minutes. The patient's level of
consciousness and vital signs were monitored continuously by
radiology nursing throughout the procedure under my direct
supervision.

FLUOROSCOPY TIME:  Fluoroscopy Time: 8 minutes 40 seconds (83 mGy).

COMPLICATIONS:
None immediate.
PROCEDURE:
The patient was placed prone on the fluoroscopic table. Nasal oxygen
was administered. Physiologic monitoring was performed throughout
the duration of the procedure. The skin overlying the thoracic
region was prepped and draped in the usual sterile fashion. The T7
and T8 vertebral bodies were identified

and the right at T7 and the left pedicle at T8 were infiltrated with
0.25% Bupivacaine. This was then followed by the advancement of a
13-gauge Cook needle through the pedicles at the levels mentioned
into the anterior one-third at T7 and T8. A gentle contrast
injection demonstrated a trabecular pattern of contrast with early
opacification of paraspinous veins anteriorly. This necessitated the
use of Gel-Foam pledgets inserted into the 13 gauge spinal needles
prior to the injection of the methylmethacrylate mixture.

At this time, methylmethacrylate mixture was reconstituted. Under
biplane intermittent fluoroscopy, the methylmethacrylate was then
injected into the T7 and T8 vertebral bodies with excellent filling
of the vertebral bodies.

No extravasation was noted into the disk spaces or posteriorly into
the spinal canal. No epidural venous contamination was seen.

The needle was then removed. Hemostasis was achieved at the skin
entry site.

There were no acute complications. Patient tolerated the procedure
well. The patient was transported to her room in good stable
condition.
IMPRESSION: Status post vertebral body augmentation for painful compression
fracture at T7 and T8 using vertebroplasty technique.

## 2017-03-12 DIAGNOSIS — B351 Tinea unguium: Secondary | ICD-10-CM | POA: Diagnosis not present

## 2017-03-12 DIAGNOSIS — M79674 Pain in right toe(s): Secondary | ICD-10-CM | POA: Diagnosis not present

## 2017-03-19 DIAGNOSIS — F419 Anxiety disorder, unspecified: Secondary | ICD-10-CM | POA: Diagnosis not present

## 2017-03-19 DIAGNOSIS — R2681 Unsteadiness on feet: Secondary | ICD-10-CM | POA: Diagnosis not present

## 2017-03-19 DIAGNOSIS — I10 Essential (primary) hypertension: Secondary | ICD-10-CM | POA: Diagnosis not present

## 2017-03-19 DIAGNOSIS — E039 Hypothyroidism, unspecified: Secondary | ICD-10-CM | POA: Diagnosis not present

## 2017-03-25 DIAGNOSIS — C9000 Multiple myeloma not having achieved remission: Secondary | ICD-10-CM | POA: Diagnosis not present

## 2017-03-28 DIAGNOSIS — C9000 Multiple myeloma not having achieved remission: Secondary | ICD-10-CM | POA: Diagnosis not present

## 2017-03-28 DIAGNOSIS — J309 Allergic rhinitis, unspecified: Secondary | ICD-10-CM | POA: Diagnosis not present

## 2017-03-28 DIAGNOSIS — R110 Nausea: Secondary | ICD-10-CM | POA: Diagnosis not present

## 2017-03-28 DIAGNOSIS — E039 Hypothyroidism, unspecified: Secondary | ICD-10-CM | POA: Diagnosis not present

## 2017-03-28 DIAGNOSIS — K219 Gastro-esophageal reflux disease without esophagitis: Secondary | ICD-10-CM | POA: Diagnosis not present

## 2017-03-28 DIAGNOSIS — M545 Low back pain: Secondary | ICD-10-CM | POA: Diagnosis not present

## 2017-03-30 ENCOUNTER — Other Ambulatory Visit: Payer: Self-pay | Admitting: Internal Medicine

## 2017-04-05 ENCOUNTER — Encounter (INDEPENDENT_AMBULATORY_CARE_PROVIDER_SITE_OTHER): Payer: Self-pay | Admitting: Internal Medicine

## 2017-04-09 ENCOUNTER — Ambulatory Visit (INDEPENDENT_AMBULATORY_CARE_PROVIDER_SITE_OTHER): Payer: Medicare Other | Admitting: Internal Medicine

## 2017-04-09 ENCOUNTER — Encounter (INDEPENDENT_AMBULATORY_CARE_PROVIDER_SITE_OTHER): Payer: Self-pay

## 2017-04-09 ENCOUNTER — Encounter (INDEPENDENT_AMBULATORY_CARE_PROVIDER_SITE_OTHER): Payer: Self-pay | Admitting: *Deleted

## 2017-04-09 ENCOUNTER — Encounter (INDEPENDENT_AMBULATORY_CARE_PROVIDER_SITE_OTHER): Payer: Self-pay | Admitting: Internal Medicine

## 2017-04-09 VITALS — BP 120/58 | HR 64 | Temp 97.9°F | Ht 65.0 in | Wt 135.4 lb

## 2017-04-09 DIAGNOSIS — R1319 Other dysphagia: Secondary | ICD-10-CM

## 2017-04-09 DIAGNOSIS — R1310 Dysphagia, unspecified: Secondary | ICD-10-CM | POA: Diagnosis not present

## 2017-04-09 NOTE — Patient Instructions (Signed)
DG esophagram.   

## 2017-04-09 NOTE — Progress Notes (Signed)
Subjective:    Patient ID: [name], female    DOB: [birth year], 83 y.o.   MRN: [id number] Referred by Dr. Nevada Crane for dysphagia; HPI Presents today with c/o dysphagia. She says she has pill dysphagia. Sometimes after she eats, she will burp. Takes Protonix for her GERD. Anything bothers her.  Her appetite is okay.  Her pills are mixed in apple sauce.  She denies any abdominal pain   Hx of multiple myeloma.and is followed at [facility]  10/14/2011 EGD/ED dysphagia:  Impression:  Small sliding heart hernia without ring stricture or evidence of esophagitis. Two small hyperplastic-appearing polyps the gastric fundus which are left alone. Esophagus dilated by passing 54 French Maloney dilator but no mucosal disruption noted  Underwent a colonoscopy in 2015 for drop in hemoglobin and heme positive stool.   Impression:  Examination performed to cecum. Appendiceal orifice was not seen. Pancolonic diverticulosis. External hemorrhoids and 2 anal papillae. Review of Systems Past Medical History:  Diagnosis Date  . Anemia   . Bradycardia   . Constipation   . Degenerative joint disease   . Diverticulosis   . External hemorrhoids   . GERD (gastroesophageal reflux disease)   . Goiter, unspecified   . Hyperlipidemia   . Hypertension   . Hypothyroidism   . Macular degeneration   . Multiple myeloma ([place]) 03/12/2016  . Osteoporosis   . Pacemaker   . Palpitations   . Seasonal allergies   . Thyrotoxicosis without mention of goiter or other cause, without mention of thyrotoxic crisis or storm     Past Surgical History:  Procedure Laterality Date  . COLONOSCOPY  November 2011   External hemorrhoids, diverticulosis, anal papillae  . COLONOSCOPY N/A 04/01/2014   Procedure: COLONOSCOPY;  Surgeon: Rogene Houston, MD;  Location: [facility];  Service: Endoscopy;  Laterality: N/A;  930  . FEMUR IM NAIL Left 12/29/2012   Procedure: INTRAMEDULLARY (IM) NAIL INTERTROCHANTRIC;   Surgeon: Melina Schools, MD;  Location: [place];  Service: Orthopedics;  Laterality: Left;  . INSERT / REPLACE / REMOVE PACEMAKER    . PERMANENT PACEMAKER INSERTION N/A 06/07/2011   Procedure: PERMANENT PACEMAKER INSERTION;  Surgeon: Evans Lance, MD;  Location: [facility] CATH LAB;  Service: Cardiovascular;  Laterality: N/A;  . THYROIDECTOMY    . TONSILLECTOMY    . vertebroplasty      Allergies  Allergen Reactions  . Tramadol Itching and Nausea Only  . Sulfa Antibiotics Nausea And Vomiting    Current Outpatient Medications on File Prior to Visit  Medication Sig Dispense Refill  . acetaminophen (TYLENOL) 500 MG tablet Take 1,000 mg by mouth every 6 (six) hours as needed for mild pain.     Marland Kitchen ALPRAZolam (XANAX) 0.25 MG tablet Take 0.25 mg by mouth at bedtime.     Marland Kitchen amLODipine (NORVASC) 5 MG tablet Take 5 mg by mouth at bedtime.     Marland Kitchen aspirin 325 MG tablet Take 81 mg by mouth daily.     . carvedilol (COREG) 6.25 MG tablet Take 1 tablet (6.25 mg total) by mouth 2 (two) times daily with a meal. 60 tablet 0  . docusate sodium (COLACE) 100 MG capsule Take 100 mg by mouth at bedtime. For constipation    . DULoxetine (CYMBALTA) 20 MG capsule Take 1 capsule by mouth every morning.    . gabapentin (NEURONTIN) 100 MG capsule Take 200 mg by mouth 2 (two) times daily.     . hydrALAZINE (APRESOLINE) 10 MG  tablet Take 10 mg by mouth 2 (two) times daily.    Marland Kitchen levothyroxine (SYNTHROID, LEVOTHROID) 88 MCG tablet Take 75 mcg by mouth daily before breakfast.     . loperamide (IMODIUM) 2 MG capsule Take 2 mg by mouth every 4 (four) hours as needed for diarrhea or loose stools.    . multivitamin-lutein (OCUVITE-LUTEIN) CAPS capsule Take 1 capsule by mouth 2 (two) times daily.    . Olopatadine HCl (PATADAY) 0.2 % SOLN Place 1 drop into both eyes daily.    . pantoprazole (PROTONIX) 40 MG tablet Take 40 mg by mouth daily.    . polyethylene glycol (MIRALAX / GLYCOLAX) packet Take 17 g by mouth daily.    . predniSONE  (DELTASONE) 5 MG tablet Take 5 mg by mouth daily with breakfast.    . sertraline (ZOLOFT) 50 MG tablet Take 50 mg by mouth daily.    Marland Kitchen acyclovir (ZOVIRAX) 400 MG tablet Take 1 tablet (400 mg total) by mouth 2 (two) times daily. (Patient not taking: Reported on 04/09/2017) 60 tablet 5  . Calcium Carbonate Antacid (CAL-GEST ANTACID PO) Take by mouth as directed.      No current facility-administered medications on file prior to visit.         Objective:   Physical Exam Blood pressure (!) 120/58, pulse 64, temperature 97.9 F (36.6 C), height 5' 5"  (1.651 m), weight 135 lb 6.4 oz (61.4 kg). Alert and oriented. Skin warm and dry. Oral mucosa is moist.   . Sclera anicteric, conjunctivae is pink. Thyroid not enlarged. No cervical lymphadenopathy. Lungs clear. Heart regular rate and rhythm.  Abdomen is soft. Bowel sounds are positive. No hepatomegaly. No abdominal masses felt. No tenderness.  No edema to lower extremities.           Assessment & Plan:  Dysphagia. Am going to get an esophagram.  Further recommendations to follow.

## 2017-04-18 ENCOUNTER — Ambulatory Visit (HOSPITAL_COMMUNITY)
Admission: RE | Admit: 2017-04-18 | Discharge: 2017-04-18 | Disposition: A | Discharge status: Home / Self Care | Payer: Medicare Other | Source: Ambulatory Visit | Attending: Internal Medicine | Admitting: Internal Medicine

## 2017-04-18 DIAGNOSIS — R1319 Other dysphagia: Secondary | ICD-10-CM

## 2017-04-18 DIAGNOSIS — R1310 Dysphagia, unspecified: Secondary | ICD-10-CM | POA: Diagnosis not present

## 2017-04-18 DIAGNOSIS — K228 Other specified diseases of esophagus: Secondary | ICD-10-CM | POA: Diagnosis not present

## 2017-04-18 DIAGNOSIS — R1314 Dysphagia, pharyngoesophageal phase: Secondary | ICD-10-CM | POA: Diagnosis not present

## 2017-04-22 DIAGNOSIS — D649 Anemia, unspecified: Secondary | ICD-10-CM | POA: Diagnosis not present

## 2017-04-22 DIAGNOSIS — R944 Abnormal results of kidney function studies: Secondary | ICD-10-CM | POA: Diagnosis not present

## 2017-04-22 DIAGNOSIS — M549 Dorsalgia, unspecified: Secondary | ICD-10-CM | POA: Diagnosis not present

## 2017-04-22 DIAGNOSIS — C9000 Multiple myeloma not having achieved remission: Secondary | ICD-10-CM | POA: Diagnosis not present

## 2017-04-22 DIAGNOSIS — R5383 Other fatigue: Secondary | ICD-10-CM | POA: Diagnosis not present

## 2017-05-27 DIAGNOSIS — T451X5A Adverse effect of antineoplastic and immunosuppressive drugs, initial encounter: Secondary | ICD-10-CM | POA: Diagnosis not present

## 2017-05-27 DIAGNOSIS — R5383 Other fatigue: Secondary | ICD-10-CM | POA: Diagnosis not present

## 2017-05-27 DIAGNOSIS — Z882 Allergy status to sulfonamides status: Secondary | ICD-10-CM | POA: Diagnosis not present

## 2017-05-27 DIAGNOSIS — C9000 Multiple myeloma not having achieved remission: Secondary | ICD-10-CM | POA: Diagnosis not present

## 2017-05-27 DIAGNOSIS — M549 Dorsalgia, unspecified: Secondary | ICD-10-CM | POA: Diagnosis not present

## 2017-05-27 DIAGNOSIS — R21 Rash and other nonspecific skin eruption: Secondary | ICD-10-CM | POA: Diagnosis not present

## 2017-05-27 DIAGNOSIS — R1310 Dysphagia, unspecified: Secondary | ICD-10-CM | POA: Diagnosis not present

## 2017-05-27 DIAGNOSIS — R7989 Other specified abnormal findings of blood chemistry: Secondary | ICD-10-CM | POA: Diagnosis not present

## 2017-05-27 DIAGNOSIS — Z885 Allergy status to narcotic agent status: Secondary | ICD-10-CM | POA: Diagnosis not present

## 2017-05-27 DIAGNOSIS — D649 Anemia, unspecified: Secondary | ICD-10-CM | POA: Diagnosis not present

## 2017-06-06 DIAGNOSIS — M79674 Pain in right toe(s): Secondary | ICD-10-CM | POA: Diagnosis not present

## 2017-06-06 DIAGNOSIS — B351 Tinea unguium: Secondary | ICD-10-CM | POA: Diagnosis not present

## 2017-06-06 DIAGNOSIS — M79675 Pain in left toe(s): Secondary | ICD-10-CM | POA: Diagnosis not present

## 2017-06-24 DIAGNOSIS — R944 Abnormal results of kidney function studies: Secondary | ICD-10-CM | POA: Diagnosis not present

## 2017-06-24 DIAGNOSIS — Z79899 Other long term (current) drug therapy: Secondary | ICD-10-CM | POA: Diagnosis not present

## 2017-06-24 DIAGNOSIS — Z888 Allergy status to other drugs, medicaments and biological substances status: Secondary | ICD-10-CM | POA: Diagnosis not present

## 2017-06-24 DIAGNOSIS — M549 Dorsalgia, unspecified: Secondary | ICD-10-CM | POA: Diagnosis not present

## 2017-06-24 DIAGNOSIS — G8929 Other chronic pain: Secondary | ICD-10-CM | POA: Diagnosis not present

## 2017-06-24 DIAGNOSIS — R5383 Other fatigue: Secondary | ICD-10-CM | POA: Diagnosis not present

## 2017-06-24 DIAGNOSIS — C9000 Multiple myeloma not having achieved remission: Secondary | ICD-10-CM | POA: Diagnosis not present

## 2017-06-24 DIAGNOSIS — Z882 Allergy status to sulfonamides status: Secondary | ICD-10-CM | POA: Diagnosis not present

## 2017-06-26 DIAGNOSIS — H35352 Cystoid macular degeneration, left eye: Secondary | ICD-10-CM | POA: Diagnosis not present

## 2017-06-26 DIAGNOSIS — H353221 Exudative age-related macular degeneration, left eye, with active choroidal neovascularization: Secondary | ICD-10-CM | POA: Diagnosis not present

## 2017-07-01 DIAGNOSIS — E871 Hypo-osmolality and hyponatremia: Secondary | ICD-10-CM | POA: Diagnosis not present

## 2017-07-01 DIAGNOSIS — K5904 Chronic idiopathic constipation: Secondary | ICD-10-CM | POA: Diagnosis not present

## 2017-07-01 DIAGNOSIS — F339 Major depressive disorder, recurrent, unspecified: Secondary | ICD-10-CM | POA: Diagnosis not present

## 2017-07-01 DIAGNOSIS — R0782 Intercostal pain: Secondary | ICD-10-CM | POA: Diagnosis not present

## 2017-07-01 DIAGNOSIS — J029 Acute pharyngitis, unspecified: Secondary | ICD-10-CM | POA: Diagnosis not present

## 2017-07-01 DIAGNOSIS — R42 Dizziness and giddiness: Secondary | ICD-10-CM | POA: Diagnosis not present

## 2017-07-01 DIAGNOSIS — R2689 Other abnormalities of gait and mobility: Secondary | ICD-10-CM | POA: Diagnosis not present

## 2017-07-01 DIAGNOSIS — M353 Polymyalgia rheumatica: Secondary | ICD-10-CM | POA: Diagnosis not present

## 2017-07-01 DIAGNOSIS — W19XXXA Unspecified fall, initial encounter: Secondary | ICD-10-CM | POA: Diagnosis not present

## 2017-07-01 DIAGNOSIS — D649 Anemia, unspecified: Secondary | ICD-10-CM | POA: Diagnosis not present

## 2017-07-01 DIAGNOSIS — F5101 Primary insomnia: Secondary | ICD-10-CM | POA: Diagnosis not present

## 2017-07-01 DIAGNOSIS — R070 Pain in throat: Secondary | ICD-10-CM | POA: Diagnosis not present

## 2017-07-03 ENCOUNTER — Other Ambulatory Visit: Payer: Self-pay

## 2017-07-03 ENCOUNTER — Emergency Department (HOSPITAL_COMMUNITY): Payer: Medicare Other

## 2017-07-03 ENCOUNTER — Encounter (HOSPITAL_COMMUNITY): Payer: Self-pay

## 2017-07-03 ENCOUNTER — Observation Stay (HOSPITAL_COMMUNITY)
Admission: RE | Admit: 2017-07-03 | Discharge: 2017-07-04 | Disposition: A | Discharge status: Home / Self Care | Payer: Medicare Other | Attending: Internal Medicine | Admitting: Internal Medicine

## 2017-07-03 DIAGNOSIS — J301 Allergic rhinitis due to pollen: Secondary | ICD-10-CM | POA: Diagnosis not present

## 2017-07-03 DIAGNOSIS — C9000 Multiple myeloma not having achieved remission: Secondary | ICD-10-CM | POA: Diagnosis not present

## 2017-07-03 DIAGNOSIS — W19XXXA Unspecified fall, initial encounter: Secondary | ICD-10-CM | POA: Diagnosis not present

## 2017-07-03 DIAGNOSIS — K5900 Constipation, unspecified: Secondary | ICD-10-CM | POA: Diagnosis present

## 2017-07-03 DIAGNOSIS — M25511 Pain in right shoulder: Secondary | ICD-10-CM | POA: Diagnosis not present

## 2017-07-03 DIAGNOSIS — K219 Gastro-esophageal reflux disease without esophagitis: Secondary | ICD-10-CM | POA: Diagnosis present

## 2017-07-03 DIAGNOSIS — I10 Essential (primary) hypertension: Secondary | ICD-10-CM | POA: Diagnosis not present

## 2017-07-03 DIAGNOSIS — Z950 Presence of cardiac pacemaker: Secondary | ICD-10-CM | POA: Insufficient documentation

## 2017-07-03 DIAGNOSIS — M549 Dorsalgia, unspecified: Secondary | ICD-10-CM | POA: Insufficient documentation

## 2017-07-03 DIAGNOSIS — E039 Hypothyroidism, unspecified: Secondary | ICD-10-CM | POA: Diagnosis not present

## 2017-07-03 DIAGNOSIS — R0902 Hypoxemia: Secondary | ICD-10-CM | POA: Diagnosis not present

## 2017-07-03 DIAGNOSIS — R42 Dizziness and giddiness: Secondary | ICD-10-CM | POA: Diagnosis not present

## 2017-07-03 DIAGNOSIS — R509 Fever, unspecified: Secondary | ICD-10-CM

## 2017-07-03 DIAGNOSIS — J101 Influenza due to other identified influenza virus with other respiratory manifestations: Principal | ICD-10-CM | POA: Diagnosis present

## 2017-07-03 DIAGNOSIS — F339 Major depressive disorder, recurrent, unspecified: Secondary | ICD-10-CM | POA: Diagnosis not present

## 2017-07-03 DIAGNOSIS — G8929 Other chronic pain: Secondary | ICD-10-CM | POA: Diagnosis present

## 2017-07-03 DIAGNOSIS — D649 Anemia, unspecified: Secondary | ICD-10-CM | POA: Diagnosis present

## 2017-07-03 DIAGNOSIS — R5383 Other fatigue: Secondary | ICD-10-CM | POA: Diagnosis not present

## 2017-07-03 DIAGNOSIS — Z79899 Other long term (current) drug therapy: Secondary | ICD-10-CM | POA: Diagnosis not present

## 2017-07-03 DIAGNOSIS — R0782 Intercostal pain: Secondary | ICD-10-CM | POA: Diagnosis not present

## 2017-07-03 DIAGNOSIS — M1990 Unspecified osteoarthritis, unspecified site: Secondary | ICD-10-CM | POA: Diagnosis present

## 2017-07-03 DIAGNOSIS — K5904 Chronic idiopathic constipation: Secondary | ICD-10-CM | POA: Diagnosis not present

## 2017-07-03 DIAGNOSIS — M353 Polymyalgia rheumatica: Secondary | ICD-10-CM | POA: Diagnosis present

## 2017-07-03 DIAGNOSIS — F0390 Unspecified dementia without behavioral disturbance: Secondary | ICD-10-CM | POA: Insufficient documentation

## 2017-07-03 DIAGNOSIS — F5101 Primary insomnia: Secondary | ICD-10-CM | POA: Diagnosis not present

## 2017-07-03 DIAGNOSIS — J302 Other seasonal allergic rhinitis: Secondary | ICD-10-CM | POA: Diagnosis present

## 2017-07-03 DIAGNOSIS — Z7982 Long term (current) use of aspirin: Secondary | ICD-10-CM | POA: Insufficient documentation

## 2017-07-03 DIAGNOSIS — R112 Nausea with vomiting, unspecified: Secondary | ICD-10-CM | POA: Diagnosis not present

## 2017-07-03 DIAGNOSIS — E876 Hypokalemia: Secondary | ICD-10-CM | POA: Diagnosis not present

## 2017-07-03 DIAGNOSIS — R51 Headache: Secondary | ICD-10-CM | POA: Diagnosis not present

## 2017-07-03 DIAGNOSIS — M545 Low back pain: Secondary | ICD-10-CM | POA: Diagnosis not present

## 2017-07-03 DIAGNOSIS — R531 Weakness: Secondary | ICD-10-CM | POA: Diagnosis not present

## 2017-07-03 HISTORY — DX: Dorsalgia, unspecified: M54.9

## 2017-07-03 HISTORY — DX: Other chronic pain: G89.29

## 2017-07-03 HISTORY — DX: Polymyalgia rheumatica: M35.3

## 2017-07-03 LAB — URINALYSIS, ROUTINE W REFLEX MICROSCOPIC
Bilirubin Urine: NEGATIVE
GLUCOSE, UA: NEGATIVE mg/dL
Ketones, ur: NEGATIVE mg/dL
Leukocytes, UA: NEGATIVE
Nitrite: NEGATIVE
Protein, ur: 30 mg/dL — AB
SPECIFIC GRAVITY, URINE: 1.015 (ref 1.005–1.030)
pH: 7 (ref 5.0–8.0)

## 2017-07-03 LAB — CBC WITH DIFFERENTIAL/PLATELET
Basophils Absolute: 0 10*3/uL (ref 0.0–0.1)
Basophils Relative: 0 %
Eosinophils Absolute: 0 10*3/uL (ref 0.0–0.7)
Eosinophils Relative: 0 %
HEMATOCRIT: 31.4 % — AB (ref 36.0–46.0)
HEMOGLOBIN: 10.1 g/dL — AB (ref 12.0–15.0)
LYMPHS ABS: 0.9 10*3/uL (ref 0.7–4.0)
LYMPHS PCT: 10 %
MCH: 33 pg (ref 26.0–34.0)
MCHC: 32.2 g/dL (ref 30.0–36.0)
MCV: 102.6 fL — AB (ref 78.0–100.0)
Monocytes Absolute: 1.1 10*3/uL — ABNORMAL HIGH (ref 0.1–1.0)
Monocytes Relative: 13 %
NEUTROS ABS: 6.5 10*3/uL (ref 1.7–7.7)
NEUTROS PCT: 77 %
Platelets: 270 10*3/uL (ref 150–400)
RBC: 3.06 MIL/uL — AB (ref 3.87–5.11)
RDW: 17.1 % — ABNORMAL HIGH (ref 11.5–15.5)
WBC: 8.4 10*3/uL (ref 4.0–10.5)

## 2017-07-03 LAB — RAPID STREP SCREEN (MED CTR MEBANE ONLY): Streptococcus, Group A Screen (Direct): NEGATIVE

## 2017-07-03 LAB — COMPREHENSIVE METABOLIC PANEL
ALT: 11 U/L — ABNORMAL LOW (ref 14–54)
ANION GAP: 12 (ref 5–15)
AST: 18 U/L (ref 15–41)
Albumin: 3.7 g/dL (ref 3.5–5.0)
Alkaline Phosphatase: 41 U/L (ref 38–126)
BUN: 19 mg/dL (ref 6–20)
CHLORIDE: 97 mmol/L — AB (ref 101–111)
CO2: 26 mmol/L (ref 22–32)
Calcium: 8.6 mg/dL — ABNORMAL LOW (ref 8.9–10.3)
Creatinine, Ser: 0.83 mg/dL (ref 0.44–1.00)
GFR, EST NON AFRICAN AMERICAN: 59 mL/min — AB (ref >60)
Glucose, Bld: 125 mg/dL — ABNORMAL HIGH (ref 65–99)
POTASSIUM: 3.3 mmol/L — AB (ref 3.5–5.1)
Sodium: 135 mmol/L (ref 135–145)
Total Bilirubin: 0.7 mg/dL (ref 0.3–1.2)
Total Protein: 6.4 g/dL — ABNORMAL LOW (ref 6.5–8.1)

## 2017-07-03 LAB — LIPASE, BLOOD: LIPASE: 24 U/L (ref 11–51)

## 2017-07-03 LAB — URINALYSIS, MICROSCOPIC (REFLEX)
BACTERIA UA: NONE SEEN
Squamous Epithelial / LPF: NONE SEEN

## 2017-07-03 LAB — I-STAT CG4 LACTIC ACID, ED: LACTIC ACID, VENOUS: 1.01 mmol/L (ref 0.5–1.9)

## 2017-07-03 MED ORDER — ACETAMINOPHEN 650 MG RE SUPP
RECTAL | Status: AC
  Administered 2017-07-03: 650 mg
  Filled 2017-07-03: qty 1

## 2017-07-03 MED ORDER — POTASSIUM CHLORIDE CRYS ER 20 MEQ PO TBCR
40.0000 meq | EXTENDED_RELEASE_TABLET | Freq: Once | ORAL | Status: AC
  Administered 2017-07-03: 40 meq via ORAL
  Filled 2017-07-03: qty 2

## 2017-07-03 MED ORDER — OSELTAMIVIR PHOSPHATE 75 MG PO CAPS
75.0000 mg | ORAL_CAPSULE | Freq: Once | ORAL | Status: AC
  Administered 2017-07-03: 75 mg via ORAL
  Filled 2017-07-03: qty 1

## 2017-07-03 MED ORDER — SODIUM CHLORIDE 0.9 % IV SOLN
1000.0000 mL | INTRAVENOUS | Status: DC
  Administered 2017-07-03: 1000 mL via INTRAVENOUS

## 2017-07-03 NOTE — H&P (Signed)
History and Physical    [name] IEP:[id number] DOB: Jan 14, 1923 DOA: 07/03/2017  PCP: Celene Squibb, MD   Patient coming from: [facility] via EMS.  I have personally briefly reviewed patient's old medical records in [place]  Chief Complaint: Fever and vomiting.  HPI: [name] is a 84 y.o. female with medical history significant of anemia, bradycardia, palpitations, sick sinus syndrome, history of pacemaker placement, chronic back pain, chronic constipation, degenerative joint disease, diverticulosis, history of external hemorrhoids, GERD, goiter, history of thyrotoxicosis, hypothyroidism, hyperlipidemia, hypertension, macular degeneration, multiple myeloma, polymyalgia rheumatica, osteoporosis, seasonal allergies who is brought to the emergency department via EMS due to fever associated with an episode of emesis.   Per patient, she has been having a sore throat since Saturday.  She saw Dr. Nevada Crane and had a swab which was negative.  She has been having decreased appetite and a dry cough for the past couple days.  This morning, she woke up with body aches, fatigue and malaise.  She had nausea and one episode of emesis.  However, denies abdominal pain, diarrhea, melena or hematochezia.  She denies chest pain, palpitations, dizziness, diaphoresis, PND, orthopnea or pitting edema of the lower extremities.  Denies oliguria, polyuria, polydipsia or blurred vision.  No heat or cold intolerance.  No jaundice, rashes or skin pruritus.  ED Course: Initial vital signs temperature 39.9C (103.9 F, pulse 94, respirations 27, blood pressure 166/64 mmHg and O2 sat 96% on room air.  The patient received Tamiflu 75 mg p.o. x1, potassium chloride 40 mEq p.o. x1 and was started on sodium chloride infusion at 100 mL/hr.  Her workup shows a white count of 8.4 with 77% neutrophils, 10% lymphocytes and 13% monocytes.  Hemoglobin was 10.1 g/dL and platelets 270.  Her urinalysis showed trace hemoglobin  and protein, otherwise unremarkable.  Negative rapid strep test.  Normal lactic acid at 1.01 mmol/L.Sodium 135, potassium 3.3, chloride 97 and CO2 26 mmol/L.  BUN 19, creatinine 0.83, calcium 8.6 and glucose 125 mg/dL.  Total protein was 6.4 and ALT was 11 U/L, but all other hepatic function values were normal.  Lipase was 24 U/L.  Her influenza by PCR tests are preliminarily positive for influenza A.  Chest radiograph shows cardiomegaly with interstitial prominence that could reflect interstitial edema.  Please see image and full radiology report for further detail.   Review of Systems: As per HPI otherwise 10 point review of systems negative.    Past Medical History:  Diagnosis Date  . Anemia   . Bradycardia   . Chronic back pain   . Constipation   . Degenerative joint disease   . Diverticulosis   . External hemorrhoids   . GERD (gastroesophageal reflux disease)   . Goiter, unspecified   . Hyperlipidemia   . Hypertension   . Hypothyroidism   . Macular degeneration   . Multiple myeloma ([place]) 03/12/2016  . Osteoporosis   . Pacemaker   . Palpitations   . Polymyalgia rheumatica ([place])   . Seasonal allergies   . Thyrotoxicosis without mention of goiter or other cause, without mention of thyrotoxic crisis or storm     Past Surgical History:  Procedure Laterality Date  . COLONOSCOPY  November 2011   External hemorrhoids, diverticulosis, anal papillae  . COLONOSCOPY N/A 04/01/2014   Procedure: COLONOSCOPY;  Surgeon: Rogene Houston, MD;  Location: [facility];  Service: Endoscopy;  Laterality: N/A;  930  . FEMUR IM NAIL Left 12/29/2012  Procedure: INTRAMEDULLARY (IM) NAIL INTERTROCHANTRIC;  Surgeon: Melina Schools, MD;  Location: [facility];  Service: Orthopedics;  Laterality: Left;  . INSERT / REPLACE / REMOVE PACEMAKER    . PERMANENT PACEMAKER INSERTION N/A 06/07/2011   Procedure: PERMANENT PACEMAKER INSERTION;  Surgeon: Evans Lance, MD;  Location: [facility] CATH LAB;  Service:  Cardiovascular;  Laterality: N/A;  . THYROIDECTOMY    . TONSILLECTOMY    . vertebroplasty       reports that  has never smoked. she has never used smokeless tobacco. She reports that she does not drink alcohol or use drugs.  Allergies  Allergen Reactions  . Tramadol Itching and Nausea Only  . Sulfa Antibiotics Nausea And Vomiting    Family History  Problem Relation Age of Onset  . Heart attack Mother        Deceased  . Cancer Father        Deceased, colon cancer age 75  . Cancer Brother        Deceased, throat and lung    Prior to Admission medications   Medication Sig Start Date End Date Taking? Authorizing Provider  acetaminophen (TYLENOL) 500 MG tablet Take 1,000 mg by mouth every 6 (six) hours as needed for mild pain.    Yes [provider]  ALPRAZolam (XANAX) 0.25 MG tablet Take 0.25 mg by mouth at bedtime.    Yes [provider]  amLODipine (NORVASC) 5 MG tablet Take 5 mg by mouth at bedtime.  08/11/14  Yes [provider]  aspirin 81 MG tablet Take 81 mg by mouth every evening.    Yes [provider]  Calcium Carbonate-Vitamin D (CALCIUM 600+D) 600-400 MG-UNIT tablet Take 1 tablet by mouth 2 (two) times daily.   Yes [provider]  carvedilol (COREG) 6.25 MG tablet Take 1 tablet (6.25 mg total) by mouth 2 (two) times daily with a meal. 07/02/15  Yes Thurnell Lose, MD  cetaphil (CETAPHIL) cream Apply 1 application topically 2 (two) times daily. DAY AND EVENING SHIFT FOR DRY SKIN   Yes [provider]  cycloSPORINE (RESTASIS) 0.05 % ophthalmic emulsion Place 1 drop into both eyes 2 (two) times daily.   Yes [provider]  diphenhydrAMINE (BENADRYL) 25 MG tablet Take 25 mg by mouth every 6 (six) hours as needed for itching or allergies.   Yes [provider]  docusate sodium (COLACE) 100 MG capsule Take 100 mg by mouth 2 (two) times daily. For constipation 01/01/13  Yes Rai, Ripudeep K, MD  DULoxetine  (CYMBALTA) 20 MG capsule Take 1 capsule by mouth every morning. 09/10/16  Yes [provider]  fentaNYL (DURAGESIC - DOSED MCG/HR) 25 MCG/HR patch Place 25 mcg onto the skin every 3 (three) days.   Yes [provider]  fexofenadine (ALLEGRA) 180 MG tablet Take 180 mg by mouth every morning.   Yes [provider]  fluticasone (FLONASE) 50 MCG/ACT nasal spray Place 1 spray into both nostrils daily as needed for allergies or rhinitis.   Yes [provider]  gabapentin (NEURONTIN) 100 MG capsule Take 200 mg by mouth 2 (two) times daily.  05/16/16  Yes [provider]  hydrALAZINE (APRESOLINE) 10 MG tablet Take 10 mg by mouth 2 (two) times daily.   Yes [provider]  hydrocortisone (ANUSOL-HC) 2.5 % rectal cream Place 1 application rectally 2 (two) times daily as needed for hemorrhoids or anal itching.   Yes [provider]  hydrocortisone cream 0.5 % Apply  1 application topically 2 (two) times daily as needed for itching (for rash).   Yes [provider]  Hypertonic Nasal Wash (SINUS RINSE NA) Place into the nose daily as needed (for sinusitis).   Yes [provider]  levothyroxine (SYNTHROID, LEVOTHROID) 75 MCG tablet Take 75 mcg by mouth daily before breakfast.  09/25/16  Yes [provider]  loperamide (IMODIUM) 2 MG capsule Take 2 mg by mouth every 4 (four) hours as needed for diarrhea or loose stools.   Yes [provider]  magnesium hydroxide (MILK OF MAGNESIA) 400 MG/5ML suspension Take by mouth 2 (two) times daily as needed for mild constipation (0.71ms administered as needed).   Yes [provider]  meclizine (ANTIVERT) 25 MG tablet Take 25 mg by mouth every 6 (six) hours as needed for dizziness.   Yes [provider]  mineral oil liquid Place in ear(s) every Thursday. 2 drops in both ears once daily on Thursdays for Cerumen Impaction   Yes [provider]    multivitamin-lutein (OCUVITE-LUTEIN) CAPS capsule Take 1 capsule by mouth 2 (two) times daily.   Yes [provider]  Olopatadine HCl (PATADAY) 0.2 % SOLN Place 1 drop into both eyes daily.   Yes [provider]  ondansetron (ZOFRAN) 8 MG tablet Take 8 mg by mouth every 8 (eight) hours as needed for nausea or vomiting.   Yes [provider]  oxycodone (OXY-IR) 5 MG capsule Take 5 mg by mouth every 4 (four) hours as needed for pain.   Yes [provider]  pantoprazole (PROTONIX) 40 MG tablet Take 40 mg by mouth daily.   Yes [provider]  polyethylene glycol (MIRALAX / GLYCOLAX) packet Take 17 g by mouth daily.   Yes [provider]  predniSONE (DELTASONE) 5 MG tablet Take 5 mg by mouth daily with breakfast.   Yes [provider]  prochlorperazine (COMPAZINE) 10 MG tablet Take 10 mg by mouth every 6 (six) hours as needed for nausea or vomiting.   Yes [provider]  senna (SENOKOT) 8.6 MG tablet Take 1 tablet by mouth 2 (two) times daily.   Yes [provider]  sertraline (ZOLOFT) 50 MG tablet Take 75 mg by mouth daily.    Yes [provider]    Physical Exam: Vitals:   07/03/17 2107 07/03/17 2159 07/03/17 2200 07/03/17 2207  BP:   (!) 128/59   Pulse:   88   Resp:   17   Temp: (!) 103.9 F (39.9 C)   100.1 F (37.8 C)  TempSrc: Rectal   Rectal  SpO2:   98%   Weight:  61.7 kg (136 lb)    Height:  _0  (1.6 m)      Constitutional: Febrile, but otherwise in NAD, calm, comfortable Eyes: PERRL, lids and conjunctivae normal ENMT: Mucous membranes are mildly dry. Posterior pharynx clear of any exudate or lesions. Neck: normal, supple, no masses, no thyromegaly Respiratory: Decreased breath sounds on bases, otherwise clear to auscultation bilaterally, no wheezing, no crackles. Normal respiratory effort. No accessory muscle use.  Cardiovascular: Regular rate and rhythm, no murmurs / rubs / gallops.  No extremity edema. 2+ pedal pulses. No carotid bruits.  Abdomen: Soft, mild LLQ tenderness, no guarding/rebound/masses palpated. No hepatosplenomegaly. Bowel sounds positive.  Musculoskeletal: no clubbing / cyanosis. Good ROM, no contractures. Normal muscle tone.  Skin: Small areas of ecchymosis from venipuncture. Neurologic: CN 2-12 grossly intact. Sensation intact, DTR normal. Strength 5/5 in all  4.  Psychiatric: Normal judgment and insight. Alert and oriented x 3. Normal mood.    Labs on Admission: I have personally reviewed following labs and imaging studies  CBC: Recent Labs  Lab 07/03/17 2132  WBC 8.4  NEUTROABS 6.5  HGB 10.1*  HCT 31.4*  MCV 102.6*  PLT 638   Basic Metabolic Panel: Recent Labs  Lab 07/03/17 2132  NA 135  K 3.3*  CL 97*  CO2 26  GLUCOSE 125*  BUN 19  CREATININE 0.83  CALCIUM 8.6*   GFR: Estimated Creatinine Clearance: 35.8 mL/min (by C-G formula based on SCr of 0.83 mg/dL). Liver Function Tests: Recent Labs  Lab 07/03/17 2132  AST 18  ALT 11*  ALKPHOS 41  BILITOT 0.7  PROT 6.4*  ALBUMIN 3.7   Recent Labs  Lab 07/03/17 2132  LIPASE 24   No results for input(s): AMMONIA in the last 168 hours. Coagulation Profile: No results for input(s): INR, PROTIME in the last 168 hours. Cardiac Enzymes: No results for input(s): CKTOTAL, CKMB, CKMBINDEX, TROPONINI in the last 168 hours. BNP (last 3 results) No results for input(s): PROBNP in the last 8760 hours. HbA1C: No results for input(s): HGBA1C in the last 72 hours. CBG: No results for input(s): GLUCAP in the last 168 hours. Lipid Profile: No results for input(s): CHOL, HDL, LDLCALC, TRIG, CHOLHDL, LDLDIRECT in the last 72 hours. Thyroid Function Tests: No results for input(s): TSH, T4TOTAL, FREET4, T3FREE, THYROIDAB in the last 72 hours. Anemia Panel: No results for input(s): VITAMINB12, FOLATE, FERRITIN, TIBC, IRON, RETICCTPCT in the last 72 hours. Urine analysis:    Component  Value Date/Time   COLORURINE YELLOW 07/03/2017 2143   APPEARANCEUR CLEAR 07/03/2017 2143   LABSPEC 1.015 07/03/2017 2143   PHURINE 7.0 07/03/2017 2143   GLUCOSEU NEGATIVE 07/03/2017 2143   HGBUR TRACE (A) 07/03/2017 2143   BILIRUBINUR NEGATIVE 07/03/2017 2143   [place] NEGATIVE 07/03/2017 2143   PROTEINUR 30 (A) 07/03/2017 2143   UROBILINOGEN 0.2 12/03/2014 1315   NITRITE NEGATIVE 07/03/2017 2143   LEUKOCYTESUR NEGATIVE 07/03/2017 2143    Radiological Exams on Admission: Dg Chest Port 1 View  Result Date: 07/03/2017 CLINICAL DATA:  Fever, vomiting EXAM: PORTABLE CHEST 1 VIEW COMPARISON:  11/25/2015 FINDINGS: Left pacer remains in place, unchanged. Mild cardiomegaly. Interstitial prominence could reflect early interstitial edema. No confluent opacities or effusions. No acute bony abnormality. IMPRESSION: Cardiomegaly. Interstitial prominence could reflect interstitial edema. Electronically Signed   By: Rolm Baptise M.D.   On: 07/03/2017 22:02    EKG: Independently reviewed. Vent. rate 95 BPM PR interval * ms QRS duration 92 ms QT/QTc 351/442 ms P-R-T axes 80 78 73 Atrial-sensed ventricular-paced rhythm No further analysis attempted due to paced rhythm. No significant change when compared to previous rhythms.  Assessment/Plan Principal Problem:   Influenza A Admit to telemetry/inpatient. Droplet precautions. Continue supplemental oxygen. Continue schedule and as needed bronchodilators. Continue gentle and time limited IV fluids. Tamiflu 75 mg p.o. twice daily for 10 doses.  Active Problems:   Hypertension Continue amlodipine 5 mg p.o. daily at bedtime. Continue carvedilol 6.25 mg p.o. twice daily. Continue hydralazine 10 mg p.o. twice daily. Monitor blood pressure and heart rate.     Chronic back pain   Degenerative joint disease. History of vertebroplasty. History of left femoral fracture. History of right humeral fracture. Continue fentanyl patches. Continue  oxycodone for breakthrough pain.    Constipation Secondary to opiate use and other medications. Continue daily MiraLAX. Continue docusate sodium 100  mg p.o. twice daily. Continue Senokot 8.6 mg p.o. twice daily. Milk of magnesia as needed.    Anemia Anemia panel was normal on 12/14/2015. Continue multivitamin supplementation. Monitor hematocrit and hemoglobin.    GERD (gastroesophageal reflux disease) Protonix 40 mg p.o. daily.    Polymyalgia rheumatica ([facility]) Continue prednisone 5 mg p.o. daily.    Hypokalemia Replacing. Follow-up potassium level.    Seasonal allergies Continue Allegra 180 mg p.o. daily or formulary equivalent. Continue Patanol eyedrops.    Hypothyroidism Continue levothyroxine 75 mcg p.o. daily. Monitor TSH as needed.    DVT prophylaxis: Lovenox SQ. Code Status: Full code. Family Communication:  Disposition Plan: Admit for IV hydration and influenza A treatment. Consults called:  Admission status: Inpatient/telemetry.   Reubin Milan MD [facility] Pager [phone].  If 7PM-7AM, please contact night-coverage [email or web address] Password [facility]  07/03/2017, 11:52 PM

## 2017-07-03 NOTE — ED Notes (Signed)
Patient on 12 lead cardiac monitor at this time. EKG done and seen by Dr Rogene Houston

## 2017-07-03 NOTE — ED Triage Notes (Signed)
Pt is resident of [facility], arrives via ems for fever and vomiting.  Facility reports multiple pt's with flu symptoms.

## 2017-07-03 NOTE — ED Provider Notes (Signed)
[facility] EMERGENCY DEPARTMENT Provider Note   CSN: [id number] Arrival date & time: 07/03/17  2041     History   Chief Complaint Chief Complaint  Patient presents with  . Fever    HPI [name] is a 84 y.o. female.  The history is provided by the patient, a relative and the EMS personnel. The history is limited by the condition of the patient (Hx dementia).  Fever    Pt was seen at 2115. Per EMS and family: Pt has been c/o sore throat for the past 2 days. Pt was evaluated by her PMD for strep throat and "it was negative" per family. Pt then began to develop fever today, as well as N/V and generalized weakness. NH reported that multiple residents have "flu symptoms." Denies diarrhea, no black or blood in emesis, no focal motor weakness, no CP/SOB.    Past Medical History:  Diagnosis Date  . Anemia   . Bradycardia   . Chronic back pain   . Constipation   . Degenerative joint disease   . Diverticulosis   . External hemorrhoids   . GERD (gastroesophageal reflux disease)   . Goiter, unspecified   . Hyperlipidemia   . Hypertension   . Hypothyroidism   . Macular degeneration   . Multiple myeloma ([place]) 03/12/2016  . Osteoporosis   . Pacemaker   . Palpitations   . Seasonal allergies   . Thyrotoxicosis without mention of goiter or other cause, without mention of thyrotoxic crisis or storm     Patient Active Problem List   Diagnosis Date Noted  . Multiple myeloma ([place]) 03/12/2016  . Thoracic spine fracture ([place]) 06/30/2015  . Anemia 11/22/2014  . Dehydration 11/22/2014  . Fracture, humerus, proximal 11/21/2014  . Fall 11/21/2014  . Closed left hip fracture ([place]) 12/27/2012  . Hyponatremia 12/27/2012  . Dysphagia 10/16/2011  . Constipation 10/16/2011  . Pacemaker 09/03/2011  . Gastroenteritis 07/21/2011  . Hypothyroidism 06/04/2011  . Chest tightness 06/03/2011  . Bradycardia 06/03/2011  . Hypertension 06/03/2011  . Acute renal insufficiency 06/03/2011  .  Epigastric abdominal tenderness 06/03/2011  . HYPERLIPIDEMIA, FAMILIAL 01/03/2010  . ANEMIA 01/03/2010  . BRADYARRHYTHMIA 01/03/2010  . PALPITATIONS 01/03/2010    Past Surgical History:  Procedure Laterality Date  . COLONOSCOPY  November 2011   External hemorrhoids, diverticulosis, anal papillae  . COLONOSCOPY N/A 04/01/2014   Procedure: COLONOSCOPY;  Surgeon: Rogene Houston, MD;  Location[facility];  Service: Endoscopy;  Laterality: N/A;  930  . FEMUR IM NAIL Left 12/29/2012   Procedure: INTRAMEDULLARY (IM) NAIL INTERTROCHANTRIC;  Surgeon: Melina Schools, MD;  Location: [place];  Service: Orthopedics;  Laterality: Left;  . INSERT / REPLACE / REMOVE PACEMAKER    . PERMANENT PACEMAKER INSERTION N/A 06/07/2011   Procedure: PERMANENT PACEMAKER INSERTION;  Surgeon: Evans Lance, MD;  Location: [facility] CATH LAB;  Service: Cardiovascular;  Laterality: N/A;  . THYROIDECTOMY    . TONSILLECTOMY    . vertebroplasty      OB History    No data available       Home Medications    Prior to Admission medications   Medication Sig Start Date End Date Taking? Authorizing Provider  acetaminophen (TYLENOL) 500 MG tablet Take 1,000 mg by mouth every 6 (six) hours as needed for mild pain.     [provider]  acyclovir (ZOVIRAX) 400 MG tablet Take 1 tablet (400 mg total) by mouth 2 (two) times daily. Patient not taking: Reported  on 04/09/2017 05/24/16   Baird Cancer, PA-C  ALPRAZolam Duanne Moron) 0.25 MG tablet Take 0.25 mg by mouth at bedtime.     [provider]  amLODipine (NORVASC) 5 MG tablet Take 5 mg by mouth at bedtime.  08/11/14   [provider]  aspirin 325 MG tablet Take 81 mg by mouth daily.     [provider]  carvedilol (COREG) 6.25 MG tablet Take 1 tablet (6.25 mg total) by mouth 2 (two) times daily with a meal. 07/02/15   Thurnell Lose, MD  docusate sodium (COLACE) 100 MG capsule Take 100 mg by mouth at bedtime. For constipation 01/01/13   Rai,  Vernelle Emerald, MD  DULoxetine (CYMBALTA) 20 MG capsule Take 1 capsule by mouth every morning. 09/10/16   [provider]  gabapentin (NEURONTIN) 100 MG capsule Take 200 mg by mouth 2 (two) times daily.  05/16/16   [provider]  hydrALAZINE (APRESOLINE) 10 MG tablet Take 10 mg by mouth 2 (two) times daily.    [provider]  levothyroxine (SYNTHROID, LEVOTHROID) 88 MCG tablet Take 75 mcg by mouth daily before breakfast.  09/25/16   [provider]  loperamide (IMODIUM) 2 MG capsule Take 2 mg by mouth every 4 (four) hours as needed for diarrhea or loose stools.    [provider]  multivitamin-lutein (OCUVITE-LUTEIN) CAPS capsule Take 1 capsule by mouth 2 (two) times daily.    [provider]  Olopatadine HCl (PATADAY) 0.2 % SOLN Place 1 drop into both eyes daily.    [provider]  pantoprazole (PROTONIX) 40 MG tablet Take 40 mg by mouth daily.    [provider]  polyethylene glycol (MIRALAX / GLYCOLAX) packet Take 17 g by mouth daily.    [provider]  predniSONE (DELTASONE) 5 MG tablet Take 5 mg by mouth daily with breakfast.    [provider]  sertraline (ZOLOFT) 50 MG tablet Take 50 mg by mouth daily.    [provider]    Family History Family History  Problem Relation Age of Onset  . Heart attack Mother        Deceased  . Cancer Father        Deceased, colon cancer age 41  . Cancer Brother        Deceased, throat and lung    Social History Social History   Tobacco Use  . Smoking status: Never Smoker  . Smokeless tobacco: Never Used  Substance Use Topics  . Alcohol use: No  . Drug use: No     Allergies   Tramadol and Sulfa antibiotics   Review of Systems Review of Systems  Unable to perform ROS: Dementia  Constitutional: Positive for fever.     Physical Exam Updated Vital Signs BP (!) 128/59   Pulse 88   Temp 100.1 F (37.8 C) (Rectal)   Resp 17   Ht _0   (1.6 m)   Wt 61.7 kg (136 lb)   SpO2 98%   BMI 24.09 kg/m     23:32:14 Orthostatic Vital Signs JM  Orthostatic Lying   BP- Lying: 107/50  Pulse- Lying: 85      Orthostatic Sitting  BP- Sitting: 102/45  Pulse- Sitting: 82     Physical Exam 2120: Physical examination:  Nursing notes reviewed; Vital signs and O2 SAT reviewed; +fever 103.9.;; Constitutional: Well developed, Well nourished, In no acute distress; Head:  Normocephalic, atraumatic; Eyes: EOMI, PERRL, No scleral icterus; ENMT: Mouth  and pharynx normal, Mucous membranes dry; Neck: Supple, Full range of motion, No lymphadenopathy; Cardiovascular: Regular rate and rhythm, No gallop; Respiratory: Breath sounds clear & equal bilaterally, No wheezes.  Speaking full sentences with ease, Normal respiratory effort/excursion; Chest: Nontender, Movement normal; Abdomen: Soft, Nontender, Nondistended, Normal bowel sounds; Genitourinary: No CVA tenderness; Extremities: Peripheral pulses normal, No tenderness, No edema, No calf edema or asymmetry.; Neuro: Awake, alert, confused per hx dementia. No facial droop. Speech clear. Moves all extremities on stretcher spontaneously and to command without apparent gross focal motor deficits.; Skin: Color normal, Warm, Dry.   ED Treatments / Results  Labs (all labs ordered are listed, but only abnormal results are displayed)   EKG  EKG Interpretation  Date/Time:  Wednesday July 03 2017 20:52:12 EDT Ventricular Rate:  95 PR Interval:    QRS Duration: 92 QT Interval:  351 QTC Calculation: 442 R Axis:   78 Text Interpretation:  Atrial-sensed ventricular-paced rhythm No further analysis attempted due to paced rhythm When compared with ECG of 06/30/2015 No significant change was found Confirmed by Francine Graven [phone]) on 07/03/2017 9:19:43 PM       Radiology   Procedures Procedures (including critical care time)  Medications Ordered in ED Medications  0.9 %  sodium chloride infusion  (1,000 mLs Intravenous New Bag/Given 07/03/17 2158)  oseltamivir (TAMIFLU) capsule 75 mg (not administered)  acetaminophen (TYLENOL) 650 MG suppository (650 mg  Given 07/03/17 2053)  potassium chloride SA (K-DUR,KLOR-CON) CR tablet 40 mEq (40 mEq Oral Given 07/03/17 2306)     Initial Impression / Assessment and Plan / ED Course  I have reviewed the triage vital signs and the nursing notes.  Pertinent labs & imaging results that were available during my care of the patient were reviewed by me and considered in my medical decision making (see chart for details).  MDM Reviewed: previous chart, nursing note and vitals Reviewed previous: labs and ECG Interpretation: labs, ECG and x-ray   Results for orders placed or performed during the hospital encounter of 07/03/17  Rapid strep screen  Result Value Ref Range   Streptococcus, Group A Screen (Direct) NEGATIVE NEGATIVE  Comprehensive metabolic panel  Result Value Ref Range   Sodium 135 135 - 145 mmol/L   Potassium 3.3 (L) 3.5 - 5.1 mmol/L   Chloride 97 (L) 101 - 111 mmol/L   CO2 26 22 - 32 mmol/L   Glucose, Bld 125 (H) 65 - 99 mg/dL   BUN 19 6 - 20 mg/dL   Creatinine, Ser 0.83 0.44 - 1.00 mg/dL   Calcium 8.6 (L) 8.9 - 10.3 mg/dL   Total Protein 6.4 (L) 6.5 - 8.1 g/dL   Albumin 3.7 3.5 - 5.0 g/dL   AST 18 15 - 41 U/L   ALT 11 (L) 14 - 54 U/L   Alkaline Phosphatase 41 38 - 126 U/L   Total Bilirubin 0.7 0.3 - 1.2 mg/dL   GFR calc non Af Amer 59 (L) >60 mL/min   GFR calc Af Amer >60 >60 mL/min   Anion gap 12 5 - 15  CBC WITH DIFFERENTIAL  Result Value Ref Range   WBC 8.4 4.0 - 10.5 K/uL   RBC 3.06 (L) 3.87 - 5.11 MIL/uL   Hemoglobin 10.1 (L) 12.0 - 15.0 g/dL   HCT 31.4 (L) 36.0 - 46.0 %   MCV 102.6 (H) 78.0 - 100.0 fL   MCH 33.0 26.0 - 34.0 pg   MCHC 32.2 30.0 - 36.0 g/dL   RDW  17.1 (H) 11.5 - 15.5 %   Platelets 270 150 - 400 K/uL   Neutrophils Relative % 77 %   Neutro Abs 6.5 1.7 - 7.7 K/uL   Lymphocytes Relative 10 %    Lymphs Abs 0.9 0.7 - 4.0 K/uL   Monocytes Relative 13 %   Monocytes Absolute 1.1 (H) 0.1 - 1.0 K/uL   Eosinophils Relative 0 %   Eosinophils Absolute 0.0 0.0 - 0.7 K/uL   Basophils Relative 0 %   Basophils Absolute 0.0 0.0 - 0.1 K/uL  Urinalysis, Routine w reflex microscopic  Result Value Ref Range   Color, Urine YELLOW YELLOW   APPearance CLEAR CLEAR   Specific Gravity, Urine 1.015 1.005 - 1.030   pH 7.0 5.0 - 8.0   Glucose, UA NEGATIVE NEGATIVE mg/dL   Hgb urine dipstick TRACE (A) NEGATIVE   Bilirubin Urine NEGATIVE NEGATIVE   Ketones, ur NEGATIVE NEGATIVE mg/dL   Protein, ur 30 (A) NEGATIVE mg/dL   Nitrite NEGATIVE NEGATIVE   Leukocytes, UA NEGATIVE NEGATIVE  Lipase, blood  Result Value Ref Range   Lipase 24 11 - 51 U/L  Urinalysis, Microscopic (reflex)  Result Value Ref Range   RBC / HPF 0-5 0 - 5 RBC/hpf   WBC, UA 0-5 0 - 5 WBC/hpf   Bacteria, UA NONE SEEN NONE SEEN   Squamous Epithelial / LPF NONE SEEN NONE SEEN  I-Stat CG4 Lactic Acid, ED  (not at  [facility])  Result Value Ref Range   Lactic Acid, Venous 1.01 0.5 - 1.9 mmol/L   Dg Chest Port 1 View Result Date: 07/03/2017 CLINICAL DATA:  Fever, vomiting EXAM: PORTABLE CHEST 1 VIEW COMPARISON:  11/25/2015 FINDINGS: Left pacer remains in place, unchanged. Mild cardiomegaly. Interstitial prominence could reflect early interstitial edema. No confluent opacities or effusions. No acute bony abnormality. IMPRESSION: Cardiomegaly. Interstitial prominence could reflect interstitial edema. Electronically Signed   By: Rolm Baptise M.D.   On: 07/03/2017 22:02    2345:  Pt unable to stand for orthostatic VS d/t generalized weakness. Lab called: +influenza A. Tx judicious IVF, APAP, tamiflu, admit. T/C returned from [facility]. Olevia Bowens, case discussed, including:  HPI, pertinent PM/SHx, VS/PE, dx testing, ED course and treatment:  Agreeable to admit.     Final Clinical Impressions(s) / ED Diagnoses   Final diagnoses:  None    ED  Discharge Orders    None        Francine Graven, DO 07/04/17 2315

## 2017-07-04 ENCOUNTER — Other Ambulatory Visit: Payer: Self-pay

## 2017-07-04 ENCOUNTER — Encounter (HOSPITAL_COMMUNITY): Payer: Self-pay

## 2017-07-04 DIAGNOSIS — M549 Dorsalgia, unspecified: Secondary | ICD-10-CM

## 2017-07-04 DIAGNOSIS — M1990 Unspecified osteoarthritis, unspecified site: Secondary | ICD-10-CM | POA: Diagnosis present

## 2017-07-04 DIAGNOSIS — J101 Influenza due to other identified influenza virus with other respiratory manifestations: Secondary | ICD-10-CM

## 2017-07-04 DIAGNOSIS — J302 Other seasonal allergic rhinitis: Secondary | ICD-10-CM | POA: Diagnosis present

## 2017-07-04 DIAGNOSIS — G8929 Other chronic pain: Secondary | ICD-10-CM | POA: Diagnosis present

## 2017-07-04 LAB — BASIC METABOLIC PANEL
Anion gap: 10 (ref 5–15)
BUN: 22 mg/dL — AB (ref 6–20)
CHLORIDE: 101 mmol/L (ref 101–111)
CO2: 26 mmol/L (ref 22–32)
Calcium: 8.1 mg/dL — ABNORMAL LOW (ref 8.9–10.3)
Creatinine, Ser: 1.04 mg/dL — ABNORMAL HIGH (ref 0.44–1.00)
GFR calc Af Amer: 52 mL/min — ABNORMAL LOW (ref >60)
GFR calc non Af Amer: 45 mL/min — ABNORMAL LOW (ref >60)
GLUCOSE: 118 mg/dL — AB (ref 65–99)
Potassium: 4.1 mmol/L (ref 3.5–5.1)
Sodium: 137 mmol/L (ref 135–145)

## 2017-07-04 LAB — CBC WITH DIFFERENTIAL/PLATELET
Basophils Absolute: 0 10*3/uL (ref 0.0–0.1)
Basophils Relative: 0 %
EOS PCT: 0 %
Eosinophils Absolute: 0 10*3/uL (ref 0.0–0.7)
HCT: 28.7 % — ABNORMAL LOW (ref 36.0–46.0)
Hemoglobin: 8.9 g/dL — ABNORMAL LOW (ref 12.0–15.0)
LYMPHS ABS: 1.6 10*3/uL (ref 0.7–4.0)
LYMPHS PCT: 15 %
MCH: 32.5 pg (ref 26.0–34.0)
MCHC: 31 g/dL (ref 30.0–36.0)
MCV: 104.7 fL — AB (ref 78.0–100.0)
MONO ABS: 1.4 10*3/uL — AB (ref 0.1–1.0)
MONOS PCT: 13 %
Neutro Abs: 7.8 10*3/uL — ABNORMAL HIGH (ref 1.7–7.7)
Neutrophils Relative %: 72 %
PLATELETS: 261 10*3/uL (ref 150–400)
RBC: 2.74 MIL/uL — AB (ref 3.87–5.11)
RDW: 17.2 % — AB (ref 11.5–15.5)
WBC: 10.8 10*3/uL — ABNORMAL HIGH (ref 4.0–10.5)

## 2017-07-04 LAB — INFLUENZA PANEL BY PCR (TYPE A & B)
INFLBPCR: NEGATIVE
Influenza A By PCR: POSITIVE — AB

## 2017-07-04 LAB — MAGNESIUM: Magnesium: 2 mg/dL (ref 1.7–2.4)

## 2017-07-04 LAB — PHOSPHORUS: Phosphorus: 3.4 mg/dL (ref 2.5–4.6)

## 2017-07-04 LAB — BRAIN NATRIURETIC PEPTIDE: B Natriuretic Peptide: 327 pg/mL — ABNORMAL HIGH (ref 0.0–100.0)

## 2017-07-04 MED ORDER — ASPIRIN EC 81 MG PO TBEC: 81.0000 mg | DELAYED_RELEASE_TABLET | Freq: Every evening | ORAL | Status: DC

## 2017-07-04 MED ORDER — DULOXETINE HCL 20 MG PO CPEP
20.0000 mg | ORAL_CAPSULE | ORAL | Status: DC
  Administered 2017-07-04: 20 mg via ORAL
  Filled 2017-07-04: qty 1

## 2017-07-04 MED ORDER — OCUVITE-LUTEIN PO CAPS
1.0000 | ORAL_CAPSULE | Freq: Two times a day (BID) | ORAL | Status: DC
  Administered 2017-07-04: 1 via ORAL
  Filled 2017-07-04: qty 1

## 2017-07-04 MED ORDER — LEVOTHYROXINE SODIUM 50 MCG PO TABS
75.0000 ug | ORAL_TABLET | Freq: Every day | ORAL | Status: DC
  Administered 2017-07-04: 75 ug via ORAL
  Filled 2017-07-04: qty 2

## 2017-07-04 MED ORDER — POTASSIUM CHLORIDE IN NACL 20-0.9 MEQ/L-% IV SOLN
INTRAVENOUS | Status: DC
  Administered 2017-07-04: 02:00:00 via INTRAVENOUS

## 2017-07-04 MED ORDER — CALCIUM CARBONATE-VITAMIN D 500-200 MG-UNIT PO TABS
1.0000 | ORAL_TABLET | Freq: Two times a day (BID) | ORAL | Status: DC
  Administered 2017-07-04: 11:00:00 1 via ORAL
  Filled 2017-07-04 (×4): qty 1

## 2017-07-04 MED ORDER — FENTANYL 25 MCG/HR TD PT72: 25.0000 ug | MEDICATED_PATCH | TRANSDERMAL | Status: DC

## 2017-07-04 MED ORDER — OSELTAMIVIR PHOSPHATE 30 MG PO CAPS
30.0000 mg | ORAL_CAPSULE | Freq: Two times a day (BID) | ORAL | Status: DC
  Administered 2017-07-04: 30 mg via ORAL
  Filled 2017-07-04: qty 1

## 2017-07-04 MED ORDER — CYCLOSPORINE 0.05 % OP EMUL
1.0000 [drp] | Freq: Two times a day (BID) | OPHTHALMIC | Status: DC
  Administered 2017-07-04: 1 [drp] via OPHTHALMIC
  Filled 2017-07-04: qty 1

## 2017-07-04 MED ORDER — HYDROCERIN EX CREA
TOPICAL_CREAM | Freq: Two times a day (BID) | CUTANEOUS | Status: DC
  Filled 2017-07-04: qty 113

## 2017-07-04 MED ORDER — AMLODIPINE BESYLATE 5 MG PO TABS: 5.0000 mg | ORAL_TABLET | Freq: Every day | ORAL | Status: DC

## 2017-07-04 MED ORDER — HYDRALAZINE HCL 10 MG PO TABS
10.0000 mg | ORAL_TABLET | Freq: Two times a day (BID) | ORAL | Status: DC
  Administered 2017-07-04: 10 mg via ORAL
  Filled 2017-07-04: qty 1

## 2017-07-04 MED ORDER — ONDANSETRON HCL 4 MG PO TABS: 4.0000 mg | ORAL_TABLET | Freq: Four times a day (QID) | ORAL | Status: DC | PRN

## 2017-07-04 MED ORDER — OSELTAMIVIR PHOSPHATE 75 MG PO CAPS: 75.0000 mg | ORAL_CAPSULE | Freq: Two times a day (BID) | ORAL | 0 refills | Status: AC

## 2017-07-04 MED ORDER — ACETAMINOPHEN 325 MG PO TABS: 650.0000 mg | ORAL_TABLET | Freq: Four times a day (QID) | ORAL | Status: DC | PRN

## 2017-07-04 MED ORDER — KETOROLAC TROMETHAMINE 15 MG/ML IJ SOLN
15.0000 mg | Freq: Once | INTRAMUSCULAR | Status: AC
  Administered 2017-07-04: 15 mg via INTRAVENOUS
  Filled 2017-07-04: qty 1

## 2017-07-04 MED ORDER — OLOPATADINE HCL 0.1 % OP SOLN
1.0000 [drp] | Freq: Two times a day (BID) | OPHTHALMIC | Status: DC
  Filled 2017-07-04: qty 5

## 2017-07-04 MED ORDER — ALBUTEROL SULFATE (2.5 MG/3ML) 0.083% IN NEBU: 2.5000 mg | INHALATION_SOLUTION | Freq: Four times a day (QID) | RESPIRATORY_TRACT | Status: DC | PRN

## 2017-07-04 MED ORDER — PREDNISONE 10 MG PO TABS
5.0000 mg | ORAL_TABLET | Freq: Every day | ORAL | Status: DC
  Administered 2017-07-04: 5 mg via ORAL
  Filled 2017-07-04: qty 1

## 2017-07-04 MED ORDER — PANTOPRAZOLE SODIUM 40 MG PO TBEC
40.0000 mg | DELAYED_RELEASE_TABLET | Freq: Every day | ORAL | Status: DC
  Administered 2017-07-04: 40 mg via ORAL
  Filled 2017-07-04: qty 1

## 2017-07-04 MED ORDER — SERTRALINE HCL 50 MG PO TABS
75.0000 mg | ORAL_TABLET | Freq: Every day | ORAL | Status: DC
  Administered 2017-07-04: 75 mg via ORAL
  Filled 2017-07-04: qty 2

## 2017-07-04 MED ORDER — POLYETHYLENE GLYCOL 3350 17 G PO PACK
17.0000 g | PACK | Freq: Every day | ORAL | Status: DC
  Administered 2017-07-04: 17 g via ORAL
  Filled 2017-07-04: qty 1

## 2017-07-04 MED ORDER — POTASSIUM CHLORIDE IN NACL 20-0.9 MEQ/L-% IV SOLN
INTRAVENOUS | Status: DC
  Administered 2017-07-04: 01:00:00 via INTRAVENOUS
  Filled 2017-07-04: qty 1000

## 2017-07-04 MED ORDER — DOCUSATE SODIUM 100 MG PO CAPS
100.0000 mg | ORAL_CAPSULE | Freq: Two times a day (BID) | ORAL | Status: DC
  Administered 2017-07-04: 100 mg via ORAL
  Filled 2017-07-04: qty 1

## 2017-07-04 MED ORDER — DIPHENHYDRAMINE HCL 25 MG PO TABS
25.0000 mg | ORAL_TABLET | Freq: Four times a day (QID) | ORAL | Status: DC | PRN
  Filled 2017-07-04: qty 1

## 2017-07-04 MED ORDER — OSELTAMIVIR PHOSPHATE 30 MG PO CAPS: 30.0000 mg | ORAL_CAPSULE | Freq: Two times a day (BID) | ORAL | 0 refills | Status: DC

## 2017-07-04 MED ORDER — IPRATROPIUM BROMIDE 0.02 % IN SOLN
0.5000 mg | Freq: Four times a day (QID) | RESPIRATORY_TRACT | Status: DC | PRN
Start: 2017-07-04 — End: 2017-07-04

## 2017-07-04 MED ORDER — NIVEA EX CREA
1.0000 "application " | TOPICAL_CREAM | Freq: Two times a day (BID) | CUTANEOUS | Status: DC
  Filled 2017-07-04: qty 120

## 2017-07-04 MED ORDER — OXYCODONE HCL 5 MG PO TABS: 5.0000 mg | ORAL_TABLET | ORAL | Status: DC | PRN

## 2017-07-04 MED ORDER — ACETAMINOPHEN 650 MG RE SUPP: 650.0000 mg | Freq: Four times a day (QID) | RECTAL | Status: DC | PRN

## 2017-07-04 MED ORDER — PROCHLORPERAZINE MALEATE 5 MG PO TABS: 10.0000 mg | ORAL_TABLET | Freq: Four times a day (QID) | ORAL | Status: DC | PRN

## 2017-07-04 MED ORDER — HYDROCORTISONE 2.5 % RE CREA
1.0000 "application " | TOPICAL_CREAM | Freq: Two times a day (BID) | RECTAL | Status: DC | PRN
  Filled 2017-07-04: qty 28.35

## 2017-07-04 MED ORDER — ENOXAPARIN SODIUM 40 MG/0.4ML ~~LOC~~ SOLN
40.0000 mg | SUBCUTANEOUS | Status: DC
  Administered 2017-07-04: 40 mg via SUBCUTANEOUS
  Filled 2017-07-04: qty 0.4

## 2017-07-04 MED ORDER — DIPHENHYDRAMINE HCL 25 MG PO CAPS: 25.0000 mg | ORAL_CAPSULE | Freq: Four times a day (QID) | ORAL | Status: DC | PRN

## 2017-07-04 MED ORDER — CARVEDILOL 3.125 MG PO TABS
6.2500 mg | ORAL_TABLET | Freq: Two times a day (BID) | ORAL | Status: DC
  Administered 2017-07-04: 6.25 mg via ORAL
  Filled 2017-07-04: qty 2

## 2017-07-04 MED ORDER — LORATADINE 10 MG PO TABS
10.0000 mg | ORAL_TABLET | Freq: Every day | ORAL | Status: DC
  Administered 2017-07-04: 10 mg via ORAL
  Filled 2017-07-04: qty 1

## 2017-07-04 MED ORDER — SENNA 8.6 MG PO TABS
1.0000 | ORAL_TABLET | Freq: Two times a day (BID) | ORAL | Status: DC
  Administered 2017-07-04: 8.6 mg via ORAL
  Filled 2017-07-04 (×4): qty 1

## 2017-07-04 MED ORDER — MAGNESIUM HYDROXIDE 400 MG/5ML PO SUSP: 5.0000 mL | Freq: Every day | ORAL | Status: DC | PRN

## 2017-07-04 MED ORDER — ONDANSETRON HCL 4 MG/2ML IJ SOLN: 4.0000 mg | Freq: Four times a day (QID) | INTRAMUSCULAR | Status: DC | PRN

## 2017-07-04 MED ORDER — ALPRAZOLAM 0.25 MG PO TABS: 0.2500 mg | ORAL_TABLET | Freq: Every day | ORAL | Status: DC

## 2017-07-04 MED ORDER — FLUTICASONE PROPIONATE 50 MCG/ACT NA SUSP: 1.0000 | Freq: Every day | NASAL | Status: DC | PRN

## 2017-07-04 MED ORDER — MECLIZINE HCL 12.5 MG PO TABS: 25.0000 mg | ORAL_TABLET | Freq: Four times a day (QID) | ORAL | Status: DC | PRN

## 2017-07-04 NOTE — Clinical Social Work Note (Signed)
Message left for son advising of discharge.   Facility notified to pick  Patient up and sent discharge clinicals.    LCSW signing off.     Deyonna Fitzsimmons, Clydene Pugh, LCSW

## 2017-07-04 NOTE — Progress Notes (Signed)
IV discontinued,catheter intact. Discharge instructions given on medications,and follow up visits,patient verbalized understanding. Prescription sent to Pharmacy of choice documented on AVS. Accompanied by staff to an awaiting vehicle. 

## 2017-07-04 NOTE — Care Management Obs Status (Deleted)
[place] NOTIFICATION   Patient Details  Name: [name] MRN: [id number] Date of Birth: 03/22/1923   Medicare Observation Status Notification Given:  Yes    Sapphire Tygart, Chauncey Reading, RN 07/04/2017, 2:02 PM

## 2017-07-04 NOTE — Care Management CC44 (Signed)
Condition Code 44 Documentation Completed  Patient Details  Name: [name] MRN: [id number] Date of Birth: 01-Aug-1922   Condition Code 44 given:   yes Patient signature on Condition Code 44 notice:   yes Documentation of 2 MD's agreement:   yes Code 44 added to claim:    yes  Sephiroth Mcluckie, Chauncey Reading, RN 07/04/2017, 2:02 PM

## 2017-07-04 NOTE — Discharge Summary (Signed)
Physician Discharge Summary  Marilyn DEAL JHE:[id number] DOB: [birth year] DOA: 07/03/2017  PCP: Celene Squibb, MD  Admit date: 07/03/2017  Discharge date: 07/04/2017  Admitted From:[facility]  Disposition:  Same  Recommendations for Outpatient Follow-up:  1. Follow up with PCP in 1-2 weeks  Home Health:N/A  Equipment/Devices:N/a  Discharge Condition:Stable  CODE STATUS: DNR  Diet recommendation: Heart Healthy  Brief/Interim Summary:  [name] is a 84 y.o. female with medical history significant of anemia, bradycardia, palpitations, sick sinus syndrome, history of pacemaker placement, chronic back pain, chronic constipation, degenerative joint disease, diverticulosis, history of external hemorrhoids, GERD, goiter, history of thyrotoxicosis, hypothyroidism, hyperlipidemia, hypertension, macular degeneration, multiple myeloma, polymyalgia rheumatica, osteoporosis, seasonal allergies who is brought to the emergency department via EMS due to fever associated with an episode of emesis.   She was noted to have body aches, fatigue, malaise as well as nausea with one episode of emesis.  She was noted to be positive for influenza A and has been started on treatment with Tamiflu.  This morning she is otherwise afebrile and is overall feeling much better and is able to tolerate her diet.  She denies any further nausea or vomiting and would like to return back to assisted living facility.   Discharge Diagnoses:  Principal Problem:   Influenza A Active Problems:   Hypertension   Hypothyroidism   Constipation   Anemia   GERD (gastroesophageal reflux disease)   Polymyalgia rheumatica (HCC)   Hypokalemia   Chronic back pain   Seasonal allergies   Degenerative joint disease  1. Influenza A.  Continue symptomatic treatment along with Tamiflu 75 mg twice daily for 5 more days.  Patient is otherwise doing much better and is stable for discharge back to assisted living facility  today. 2. Hypertension.  Continue home medications as previously prescribed. 3. Chronic back pain with DJD.  Continue fentanyl patch as well as oxycodone for breakthrough pain. 4. Constipation.  This is secondary to opiate use.  Continue stool softeners as well as Senokot and milk of magnesia as needed. 5. Anemia.  Stable. 6. Polymyalgia rheumatica.  Continue prednisone 5 mg p.o. daily. 7. GERD.  Continue Protonix 40 mg p.o. daily. 8. Hypothyroidism.  Continue levothyroxine 75 mcg p.o. daily.  Discharge Instructions  Discharge Instructions    Call MD for:  difficulty breathing, headache or visual disturbances   Complete by:  As directed    Call MD for:  temperature >100.4   Complete by:  As directed    Diet - low sodium heart healthy   Complete by:  As directed    Increase activity slowly   Complete by:  As directed      Allergies as of 07/04/2017      Reactions   Tramadol Itching, Nausea Only   Sulfa Antibiotics Nausea And Vomiting      Medication List    TAKE these medications   ALPRAZolam 0.25 MG tablet Commonly known as:  XANAX Take 0.25 mg by mouth at bedtime.   amLODipine 5 MG tablet Commonly known as:  NORVASC Take 5 mg by mouth at bedtime.   aspirin 81 MG tablet Take 81 mg by mouth every evening.   CALCIUM 600+D 600-400 MG-UNIT tablet Generic drug:  Calcium Carbonate-Vitamin D Take 1 tablet by mouth 2 (two) times daily.   carvedilol 6.25 MG tablet Commonly known as:  COREG Take 1 tablet (6.25 mg total) by mouth 2 (two) times daily with a meal.   cetaphil  cream Apply 1 application topically 2 (two) times daily. DAY AND EVENING SHIFT FOR DRY SKIN   cycloSPORINE 0.05 % ophthalmic emulsion Commonly known as:  RESTASIS Place 1 drop into both eyes 2 (two) times daily.   diphenhydrAMINE 25 MG tablet Commonly known as:  BENADRYL Take 25 mg by mouth every 6 (six) hours as needed for itching or allergies.   docusate sodium 100 MG capsule Commonly known as:   COLACE Take 100 mg by mouth 2 (two) times daily. For constipation   DULoxetine 20 MG capsule Commonly known as:  CYMBALTA Take 1 capsule by mouth every morning.   fentaNYL 25 MCG/HR patch Commonly known as:  DURAGESIC - dosed mcg/hr Place 25 mcg onto the skin every 3 (three) days.   fexofenadine 180 MG tablet Commonly known as:  ALLEGRA Take 180 mg by mouth every morning.   fluticasone 50 MCG/ACT nasal spray Commonly known as:  FLONASE Place 1 spray into both nostrils daily as needed for allergies or rhinitis.   gabapentin 100 MG capsule Commonly known as:  NEURONTIN Take 200 mg by mouth 2 (two) times daily.   hydrALAZINE 10 MG tablet Commonly known as:  APRESOLINE Take 10 mg by mouth 2 (two) times daily.   hydrocortisone 2.5 % rectal cream Commonly known as:  ANUSOL-HC Place 1 application rectally 2 (two) times daily as needed for hemorrhoids or anal itching.   hydrocortisone cream 0.5 % Apply 1 application topically 2 (two) times daily as needed for itching (for rash).   levothyroxine 75 MCG tablet Commonly known as:  SYNTHROID, LEVOTHROID Take 75 mcg by mouth daily before breakfast.   loperamide 2 MG capsule Commonly known as:  IMODIUM Take 2 mg by mouth every 4 (four) hours as needed for diarrhea or loose stools.   magnesium hydroxide 400 MG/5ML suspension Commonly known as:  MILK OF MAGNESIA Take by mouth 2 (two) times daily as needed for mild constipation (0.66ms administered as needed).   meclizine 25 MG tablet Commonly known as:  ANTIVERT Take 25 mg by mouth every 6 (six) hours as needed for dizziness.   mineral oil liquid Place in ear(s) every Thursday. 2 drops in both ears once daily on Thursdays for Cerumen Impaction   multivitamin-lutein Caps capsule Take 1 capsule by mouth 2 (two) times daily.   ondansetron 8 MG tablet Commonly known as:  ZOFRAN Take 8 mg by mouth every 8 (eight) hours as needed for nausea or vomiting.   oseltamivir 75 MG  capsule Commonly known as:  TAMIFLU Take 1 capsule (75 mg total) by mouth 2 (two) times daily for 5 days.   oxycodone 5 MG capsule Commonly known as:  OXY-IR Take 5 mg by mouth every 4 (four) hours as needed for pain.   pantoprazole 40 MG tablet Commonly known as:  PROTONIX Take 40 mg by mouth daily.   PATADAY 0.2 % Soln Generic drug:  Olopatadine HCl Place 1 drop into both eyes daily.   polyethylene glycol packet Commonly known as:  MIRALAX / GLYCOLAX Take 17 g by mouth daily.   predniSONE 5 MG tablet Commonly known as:  DELTASONE Take 5 mg by mouth daily with breakfast.   prochlorperazine 10 MG tablet Commonly known as:  COMPAZINE Take 10 mg by mouth every 6 (six) hours as needed for nausea or vomiting.   senna 8.6 MG tablet Commonly known as:  SENOKOT Take 1 tablet by mouth 2 (two) times daily.   sertraline 50 MG tablet Commonly known  as:  ZOLOFT Take 75 mg by mouth daily.   SINUS RINSE NA Place into the nose daily as needed (for sinusitis).   TYLENOL 500 MG tablet Generic drug:  acetaminophen Take 1,000 mg by mouth every 6 (six) hours as needed for mild pain.      Follow-up Information    Celene Squibb, MD Follow up in 1 week(s).   Specialty:  Internal Medicine Contact information: [place] [address] [phone]          Allergies  Allergen Reactions  . Tramadol Itching and Nausea Only  . Sulfa Antibiotics Nausea And Vomiting    Consultations:  None   Procedures/Studies: Dg Chest Port 1 View  Result Date: 07/03/2017 CLINICAL DATA:  Fever, vomiting EXAM: PORTABLE CHEST 1 VIEW COMPARISON:  11/25/2015 FINDINGS: Left pacer remains in place, unchanged. Mild cardiomegaly. Interstitial prominence could reflect early interstitial edema. No confluent opacities or effusions. No acute bony abnormality. IMPRESSION: Cardiomegaly. Interstitial prominence could reflect interstitial edema. Electronically Signed   By: Rolm Baptise M.D.    On: 07/03/2017 22:02    Discharge Exam: Vitals:   07/04/17 0400 07/04/17 0934  BP: (!) 122/46   Pulse: (!) 56   Resp: 18   Temp: (!) 97.5 F (36.4 C)   SpO2: 100% 98%   Vitals:   07/04/17 0130 07/04/17 0300 07/04/17 0400 07/04/17 0934  BP: (!) 120/55 (!) 116/53 (!) 122/46   Pulse: 61  (!) 56   Resp: 20 18 18    Temp:   (!) 97.5 F (36.4 C)   TempSrc:   Oral   SpO2: 97% 97% 100% 98%  Weight:   60.9 kg (134 lb 4.2 oz)   Height:   5' 3"  (1.6 m)     General: Pt is alert, awake, not in acute distress Cardiovascular: RRR, S1/S2 +, no rubs, no gallops Respiratory: CTA bilaterally, no wheezing, no rhonchi Abdominal: Soft, NT, ND, bowel sounds + Extremities: no edema, no cyanosis    The results of significant diagnostics from this hospitalization (including imaging, microbiology, ancillary and laboratory) are listed below for reference.     Microbiology: Recent Results (from the past 240 hour(s))  Blood Culture (routine x 2)     Status: None (Preliminary result)   Collection Time: 07/03/17  9:32 PM  Result Value Ref Range Status   Specimen Description BLOOD  Final   Special Requests NONE  Final   Culture   Final    NO GROWTH < 12 HOURS Performed at [facility], [address] [place], [place] [address]    Report Status PENDING  Incomplete  Blood Culture (routine x 2)     Status: None (Preliminary result)   Collection Time: 07/03/17  9:32 PM  Result Value Ref Range Status   Specimen Description BLOOD  Final   Special Requests NONE  Final   Culture   Final    NO GROWTH < 12 HOURS Performed at [facility], [address] [place], [place] [address]    Report Status PENDING  Incomplete  Rapid strep screen     Status: None   Collection Time: 07/03/17  9:53 PM  Result Value Ref Range Status   Streptococcus, Group A Screen (Direct) NEGATIVE NEGATIVE Final    Comment: (NOTE) A Rapid Antigen test may result negative if the antigen level in the sample is below the  detection level of this test. The FDA has not cleared this test as a stand-alone test  therefore the rapid antigen negative result has reflexed to a Group A Strep culture. Performed at [facility], [address] [place], [place] [address]      Labs: BNP (last 3 results) Recent Labs    07/03/17 2132  BNP 326.7*   Basic Metabolic Panel: Recent Labs  Lab 07/03/17 2132 07/04/17 0427  NA 135 137  K 3.3* 4.1  CL 97* 101  CO2 26 26  GLUCOSE 125* 118*  BUN 19 22*  CREATININE 0.83 1.04*  CALCIUM 8.6* 8.1*  MG 2.0  --   PHOS 3.4  --    Liver Function Tests: Recent Labs  Lab 07/03/17 2132  AST 18  ALT 11*  ALKPHOS 41  BILITOT 0.7  PROT 6.4*  ALBUMIN 3.7   Recent Labs  Lab 07/03/17 2132  LIPASE 24   No results for input(s): AMMONIA in the last 168 hours. CBC: Recent Labs  Lab 07/03/17 2132 07/04/17 0427  WBC 8.4 10.8*  NEUTROABS 6.5 7.8*  HGB 10.1* 8.9*  HCT 31.4* 28.7*  MCV 102.6* 104.7*  PLT 270 261   Cardiac Enzymes: No results for input(s): CKTOTAL, CKMB, CKMBINDEX, TROPONINI in the last 168 hours. BNP: Invalid input(s): POCBNP CBG: No results for input(s): GLUCAP in the last 168 hours. D-Dimer No results for input(s): DDIMER in the last 72 hours. Hgb A1c No results for input(s): HGBA1C in the last 72 hours. Lipid Profile No results for input(s): CHOL, HDL, LDLCALC, TRIG, CHOLHDL, LDLDIRECT in the last 72 hours. Thyroid function studies No results for input(s): TSH, T4TOTAL, T3FREE, THYROIDAB in the last 72 hours.  Invalid input(s): FREET3 Anemia work up No results for input(s): VITAMINB12, FOLATE, FERRITIN, TIBC, IRON, RETICCTPCT in the last 72 hours. Urinalysis    Component Value Date/Time   COLORURINE YELLOW 07/03/2017 2143   APPEARANCEUR CLEAR 07/03/2017 2143   LABSPEC 1.015 07/03/2017 2143   PHURINE 7.0 07/03/2017 2143   GLUCOSEU NEGATIVE 07/03/2017 2143   HGBUR TRACE (A) 07/03/2017 2143   BILIRUBINUR NEGATIVE 07/03/2017 2143    [place] NEGATIVE 07/03/2017 2143   PROTEINUR 30 (A) 07/03/2017 2143   UROBILINOGEN 0.2 12/03/2014 1315   NITRITE NEGATIVE 07/03/2017 2143   LEUKOCYTESUR NEGATIVE 07/03/2017 2143   Sepsis Labs Invalid input(s): PROCALCITONIN,  WBC,  LACTICIDVEN Microbiology Recent Results (from the past 240 hour(s))  Blood Culture (routine x 2)     Status: None (Preliminary result)   Collection Time: 07/03/17  9:32 PM  Result Value Ref Range Status   Specimen Description BLOOD  Final   Special Requests NONE  Final   Culture   Final    NO GROWTH < 12 HOURS Performed at [facility], [address], [place], [place] [address]    Report Status PENDING  Incomplete  Blood Culture (routine x 2)     Status: None (Preliminary result)   Collection Time: 07/03/17  9:32 PM  Result Value Ref Range Status   Specimen Description BLOOD  Final   Special Requests NONE  Final   Culture   Final    NO GROWTH < 12 HOURS Performed at [facility], [address], [place], [place] [address]    Report Status PENDING  Incomplete  Rapid strep screen     Status: None   Collection Time: 07/03/17  9:53 PM  Result Value Ref Range Status   Streptococcus, Group A Screen (Direct) NEGATIVE NEGATIVE Final    Comment: (NOTE) A Rapid Antigen test may result negative if the antigen level in the  sample is below the detection level of this test. The FDA has not cleared this test as a stand-alone test therefore the rapid antigen negative result has reflexed to a Group A Strep culture. Performed at [facility], [address] [place], [place] [address]      Time coordinating discharge: Over 30 minutes  SIGNED:   Rodena Goldmann, DO [facility] 07/04/2017, 11:58 AM Pager [phone]  If 7PM-7AM, please contact night-coverage [email or web address] Password [email or web address]

## 2017-07-04 NOTE — Care Management Obs Status (Signed)
[place] NOTIFICATION   Patient Details  Name: [name] MRN: [id number] Date of Birth: [birth year]   Medicare Observation Status Notification Given:  Yes    Nataleigh Griffin, Chauncey Reading, RN 07/04/2017, 2:02 PM

## 2017-07-04 NOTE — NC FL2 (Signed)
[place] MEDICAID FL2 LEVEL OF CARE SCREENING TOOL     IDENTIFICATION  Patient Name: [name] Birthdate: [birth year] Sex: female Admission Date (Current Location): 07/03/2017  [facility] and [place] Number:  [employer] and Address:  [place] [address], [place]      Provider Number: [id number]  Attending Physician Name and Address:  Rodena Goldmann, DO  Relative Name and Phone Number:       Current Level of Care: Hospital Recommended Level of Care: [place] Prior Approval Number:    Date Approved/Denied:   PASRR Number:    Discharge Plan: Other (Comment)([facility])    Current Diagnoses: Patient Active Problem List   Diagnosis Date Noted  . Chronic back pain 07/04/2017  . Seasonal allergies 07/04/2017  . Degenerative joint disease 07/04/2017  . Influenza A 07/03/2017  . GERD (gastroesophageal reflux disease) 07/03/2017  . Polymyalgia rheumatica ([place]) 07/03/2017  . Hypokalemia 07/03/2017  . Multiple myeloma ([place]) 03/12/2016  . Thoracic spine fracture ([place]) 06/30/2015  . Anemia 11/22/2014  . Dehydration 11/22/2014  . Fracture, humerus, proximal 11/21/2014  . Fall 11/21/2014  . Closed left hip fracture ([place]) 12/27/2012  . Hyponatremia 12/27/2012  . Dysphagia 10/16/2011  . Constipation 10/16/2011  . Pacemaker 09/03/2011  . Gastroenteritis 07/21/2011  . Hypothyroidism 06/04/2011  . Chest tightness 06/03/2011  . Bradycardia 06/03/2011  . Hypertension 06/03/2011  . Acute renal insufficiency 06/03/2011  . Epigastric abdominal tenderness 06/03/2011  . HYPERLIPIDEMIA, FAMILIAL 01/03/2010  . ANEMIA 01/03/2010  . BRADYARRHYTHMIA 01/03/2010  . PALPITATIONS 01/03/2010    Orientation RESPIRATION BLADDER Height & Weight     Self, Time, Situation, Place  Normal Incontinent Weight: 134 lb 4.2 oz (60.9 kg) Height:  5' 3"  (160 cm)  BEHAVIORAL SYMPTOMS/MOOD NEUROLOGICAL BOWEL NUTRITION STATUS   Continent Diet(heart healthy which equates to the facility )  AMBULATORY STATUS COMMUNICATION OF NEEDS Skin   Supervision(uses a walker) Verbally Normal                       Personal Care Assistance Level of Assistance  Bathing, Feeding, Dressing Bathing Assistance: Limited assistance(stand by assist) Feeding assistance: Independent Dressing Assistance: Limited assistance(stand by assist)     Functional Limitations Info  Sight, Hearing, Speech Sight Info: Adequate Hearing Info: Adequate Speech Info: Adequate    SPECIAL CARE FACTORS FREQUENCY                       Contractures Contractures Info: Not present    Additional Factors Info  Code Status, Allergies, Psychotropic Code Status Info: DNR Allergies Info: Tramadol, Sulfa Antibiotics Psychotropic Info: Xanax, Zoloft         Current Medications (07/04/2017):  This is the current hospital active medication list Current Facility-Administered Medications  Medication Dose Route Frequency Provider Last Rate Last Dose  . 0.9 % NaCl with KCl 20 mEq/ L  infusion   Intravenous Continuous Reubin Milan, MD 50 mL/hr at 07/04/17 0139    . acetaminophen (TYLENOL) tablet 650 mg  650 mg Oral Q6H PRN Reubin Milan, MD       Or  . acetaminophen (TYLENOL) suppository 650 mg  650 mg Rectal Q6H PRN Reubin Milan, MD      . albuterol (PROVENTIL) (2.5 MG/3ML) 0.083% nebulizer solution 2.5 mg  2.5 mg Nebulization Q6H PRN Reubin Milan, MD      . ALPRAZolam Duanne Moron) tablet 0.25 mg  0.25 mg Oral QHS Reubin Milan, MD      . amLODipine [facility]) tablet 5 mg  5 mg Oral QHS Reubin Milan, MD      . aspirin EC tablet 81 mg  81 mg Oral QPM Reubin Milan, MD      . calcium-vitamin D (OSCAL WITH D) 500-200 MG-UNIT per tablet 1 tablet  1 tablet Oral BID Reubin Milan, MD   1 tablet at 07/04/17 1057  . carvedilol (COREG) tablet 6.25 mg  6.25 mg Oral BID WC Reubin Milan, MD   6.25 mg at  07/04/17 0755  . cycloSPORINE (RESTASIS) 0.05 % ophthalmic emulsion 1 drop  1 drop Both Eyes BID Reubin Milan, MD   1 drop at 07/04/17 1056  . diphenhydrAMINE (BENADRYL) capsule 25 mg  25 mg Oral Q6H PRN Manuella Ghazi, Pratik D, DO      . docusate sodium (COLACE) capsule 100 mg  100 mg Oral BID Reubin Milan, MD   100 mg at 07/04/17 1057  . DULoxetine (CYMBALTA) DR capsule 20 mg  20 mg Oral Kristine Linea, MD   20 mg at 07/04/17 0755  . enoxaparin (LOVENOX) injection 40 mg  40 mg Subcutaneous Q24H Reubin Milan, MD   40 mg at 07/04/17 0754  . [START ON 07/06/2017] fentaNYL (DURAGESIC - dosed mcg/hr) patch 25 mcg  25 mcg Transdermal Q72H Reubin Milan, MD      . fluticasone [facility]) 50 MCG/ACT nasal spray 1 spray  1 spray Each Nare Daily PRN Reubin Milan, MD      . hydrALAZINE (APRESOLINE) tablet 10 mg  10 mg Oral BID Reubin Milan, MD   10 mg at 07/04/17 1057  . hydrocerin (EUCERIN) cream   Topical BID Manuella Ghazi, Pratik D, DO      . hydrocortisone (ANUSOL-HC) 2.5 % rectal cream 1 application  1 application Rectal BID PRN Reubin Milan, MD      . ipratropium (ATROVENT) nebulizer solution 0.5 mg  0.5 mg Nebulization Q6H PRN Reubin Milan, MD      . levothyroxine (SYNTHROID, LEVOTHROID) tablet 75 mcg  75 mcg Oral QAC breakfast Reubin Milan, MD   75 mcg at 07/04/17 0755  . loratadine (CLARITIN) tablet 10 mg  10 mg Oral Daily Reubin Milan, MD   10 mg at 07/04/17 1057  . magnesium hydroxide (MILK OF MAGNESIA) suspension 5 mL  5 mL Oral Daily PRN Reubin Milan, MD      . meclizine (ANTIVERT) tablet 25 mg  25 mg Oral Q6H PRN Reubin Milan, MD      . multivitamin-lutein [facility]) capsule 1 capsule  1 capsule Oral BID Reubin Milan, MD   1 capsule at 07/04/17 1057  . olopatadine (PATANOL) 0.1 % ophthalmic solution 1 drop  1 drop Both Eyes BID Reubin Milan, MD      . ondansetron [facility]) tablet 4 mg  4 mg Oral Q6H  PRN Reubin Milan, MD       Or  . ondansetron [facility]) injection 4 mg  4 mg Intravenous Q6H PRN Reubin Milan, MD      . oseltamivir (TAMIFLU) capsule 30 mg  30 mg Oral BID Reubin Milan, MD   30 mg at 07/04/17 1058  . oxyCODONE (Oxy IR/ROXICODONE) immediate release tablet 5 mg  5 mg Oral Q4H PRN Reubin Milan, MD      . pantoprazole ([place]) EC  tablet 40 mg  40 mg Oral Daily Reubin Milan, MD   40 mg at 07/04/17 1057  . polyethylene glycol (MIRALAX / GLYCOLAX) packet 17 g  17 g Oral Daily Reubin Milan, MD   17 g at 07/04/17 1056  . predniSONE (DELTASONE) tablet 5 mg  5 mg Oral Q breakfast Reubin Milan, MD   5 mg at 07/04/17 0754  . prochlorperazine (COMPAZINE) tablet 10 mg  10 mg Oral Q6H PRN Reubin Milan, MD      . senna [facility]) tablet 8.6 mg  1 tablet Oral BID Reubin Milan, MD   8.6 mg at 07/04/17 1057  . sertraline (ZOLOFT) tablet 75 mg  75 mg Oral Daily Reubin Milan, MD   75 mg at 07/04/17 1057     Discharge Medications: Medication List     TAKE these medications   ALPRAZolam 0.25 MG tablet Commonly known as:  XANAX Take 0.25 mg by mouth at bedtime.   amLODipine 5 MG tablet Commonly known as:  NORVASC Take 5 mg by mouth at bedtime.   aspirin 81 MG tablet Take 81 mg by mouth every evening.   CALCIUM 600+D 600-400 MG-UNIT tablet Generic drug:  Calcium Carbonate-Vitamin D Take 1 tablet by mouth 2 (two) times daily.   carvedilol 6.25 MG tablet Commonly known as:  COREG Take 1 tablet (6.25 mg total) by mouth 2 (two) times daily with a meal.   cetaphil cream Apply 1 application topically 2 (two) times daily. DAY AND EVENING SHIFT FOR DRY SKIN   cycloSPORINE 0.05 % ophthalmic emulsion Commonly known as:  RESTASIS Place 1 drop into both eyes 2 (two) times daily.   diphenhydrAMINE 25 MG tablet Commonly known as:  BENADRYL Take 25 mg by mouth every 6 (six) hours as needed for itching or allergies.    docusate sodium 100 MG capsule Commonly known as:  COLACE Take 100 mg by mouth 2 (two) times daily. For constipation   DULoxetine 20 MG capsule Commonly known as:  CYMBALTA Take 1 capsule by mouth every morning.   fentaNYL 25 MCG/HR patch Commonly known as:  DURAGESIC - dosed mcg/hr Place 25 mcg onto the skin every 3 (three) days.   fexofenadine 180 MG tablet Commonly known as:  ALLEGRA Take 180 mg by mouth every morning.   fluticasone 50 MCG/ACT nasal spray Commonly known as:  FLONASE Place 1 spray into both nostrils daily as needed for allergies or rhinitis.   gabapentin 100 MG capsule Commonly known as:  NEURONTIN Take 200 mg by mouth 2 (two) times daily.   hydrALAZINE 10 MG tablet Commonly known as:  APRESOLINE Take 10 mg by mouth 2 (two) times daily.   hydrocortisone 2.5 % rectal cream Commonly known as:  ANUSOL-HC Place 1 application rectally 2 (two) times daily as needed for hemorrhoids or anal itching.   hydrocortisone cream 0.5 % Apply 1 application topically 2 (two) times daily as needed for itching (for rash).   levothyroxine 75 MCG tablet Commonly known as:  SYNTHROID, LEVOTHROID Take 75 mcg by mouth daily before breakfast.   loperamide 2 MG capsule Commonly known as:  IMODIUM Take 2 mg by mouth every 4 (four) hours as needed for diarrhea or loose stools.   magnesium hydroxide 400 MG/5ML suspension Commonly known as:  MILK OF MAGNESIA Take by mouth 2 (two) times daily as needed for mild constipation (0.65ms administered as needed).   meclizine 25 MG tablet Commonly known as:  ANTIVERT Take  25 mg by mouth every 6 (six) hours as needed for dizziness.   mineral oil liquid Place in ear(s) every Thursday. 2 drops in both ears once daily on Thursdays for Cerumen Impaction   multivitamin-lutein Caps capsule Take 1 capsule by mouth 2 (two) times daily.   ondansetron 8 MG tablet Commonly known as:  ZOFRAN Take 8 mg by mouth every 8  (eight) hours as needed for nausea or vomiting.   oseltamivir 75 MG capsule Commonly known as:  TAMIFLU Take 1 capsule (75 mg total) by mouth 2 (two) times daily for 5 days.   oxycodone 5 MG capsule Commonly known as:  OXY-IR Take 5 mg by mouth every 4 (four) hours as needed for pain.   pantoprazole 40 MG tablet Commonly known as:  PROTONIX Take 40 mg by mouth daily.   PATADAY 0.2 % Soln Generic drug:  Olopatadine HCl Place 1 drop into both eyes daily.   polyethylene glycol packet Commonly known as:  MIRALAX / GLYCOLAX Take 17 g by mouth daily.   predniSONE 5 MG tablet Commonly known as:  DELTASONE Take 5 mg by mouth daily with breakfast.   prochlorperazine 10 MG tablet Commonly known as:  COMPAZINE Take 10 mg by mouth every 6 (six) hours as needed for nausea or vomiting.   senna 8.6 MG tablet Commonly known as:  SENOKOT Take 1 tablet by mouth 2 (two) times daily.   sertraline 50 MG tablet Commonly known as:  ZOLOFT Take 75 mg by mouth daily.   SINUS RINSE NA Place into the nose daily as needed (for sinusitis).   TYLENOL 500 MG tablet Generic drug:  acetaminophen Take 1,000 mg by mouth every 6 (six) hours as needed for mild pain.       Relevant Imaging Results:  Relevant Lab Results:   Additional Information    Brucha Ahlquist, Clydene Pugh, LCSW

## 2017-07-04 NOTE — Progress Notes (Signed)
Patient has arrived on [address] and Edrick Oh has assumed care.

## 2017-07-05 LAB — URINE CULTURE: CULTURE: NO GROWTH

## 2017-07-06 LAB — CULTURE, GROUP A STREP (THRC)

## 2017-07-08 LAB — CULTURE, BLOOD (ROUTINE X 2)
CULTURE: NO GROWTH
CULTURE: NO GROWTH
SPECIAL REQUESTS: ADEQUATE
Special Requests: ADEQUATE

## 2017-07-11 DIAGNOSIS — K649 Unspecified hemorrhoids: Secondary | ICD-10-CM | POA: Diagnosis not present

## 2017-07-11 DIAGNOSIS — E871 Hypo-osmolality and hyponatremia: Secondary | ICD-10-CM | POA: Diagnosis not present

## 2017-07-11 DIAGNOSIS — R350 Frequency of micturition: Secondary | ICD-10-CM | POA: Diagnosis not present

## 2017-07-11 DIAGNOSIS — F5101 Primary insomnia: Secondary | ICD-10-CM | POA: Diagnosis not present

## 2017-07-11 DIAGNOSIS — Z6824 Body mass index (BMI) 24.0-24.9, adult: Secondary | ICD-10-CM | POA: Diagnosis not present

## 2017-07-11 DIAGNOSIS — D649 Anemia, unspecified: Secondary | ICD-10-CM | POA: Diagnosis not present

## 2017-07-11 DIAGNOSIS — F339 Major depressive disorder, recurrent, unspecified: Secondary | ICD-10-CM | POA: Diagnosis not present

## 2017-07-11 DIAGNOSIS — W19XXXA Unspecified fall, initial encounter: Secondary | ICD-10-CM | POA: Diagnosis not present

## 2017-07-11 DIAGNOSIS — M353 Polymyalgia rheumatica: Secondary | ICD-10-CM | POA: Diagnosis not present

## 2017-07-11 DIAGNOSIS — J1089 Influenza due to other identified influenza virus with other manifestations: Secondary | ICD-10-CM | POA: Diagnosis not present

## 2017-07-11 DIAGNOSIS — K5904 Chronic idiopathic constipation: Secondary | ICD-10-CM | POA: Diagnosis not present

## 2017-07-11 DIAGNOSIS — R0782 Intercostal pain: Secondary | ICD-10-CM | POA: Diagnosis not present

## 2017-07-11 DIAGNOSIS — R2689 Other abnormalities of gait and mobility: Secondary | ICD-10-CM | POA: Diagnosis not present

## 2017-07-11 DIAGNOSIS — D509 Iron deficiency anemia, unspecified: Secondary | ICD-10-CM | POA: Diagnosis not present

## 2017-07-11 DIAGNOSIS — R42 Dizziness and giddiness: Secondary | ICD-10-CM | POA: Diagnosis not present

## 2017-07-17 DIAGNOSIS — M79672 Pain in left foot: Secondary | ICD-10-CM | POA: Diagnosis not present

## 2017-07-17 DIAGNOSIS — I739 Peripheral vascular disease, unspecified: Secondary | ICD-10-CM | POA: Diagnosis not present

## 2017-07-17 DIAGNOSIS — M79671 Pain in right foot: Secondary | ICD-10-CM | POA: Diagnosis not present

## 2017-07-17 DIAGNOSIS — L110 Acquired keratosis follicularis: Secondary | ICD-10-CM | POA: Diagnosis not present

## 2017-07-22 DIAGNOSIS — M545 Low back pain: Secondary | ICD-10-CM | POA: Diagnosis present

## 2017-07-22 DIAGNOSIS — M353 Polymyalgia rheumatica: Secondary | ICD-10-CM | POA: Diagnosis present

## 2017-07-22 DIAGNOSIS — M810 Age-related osteoporosis without current pathological fracture: Secondary | ICD-10-CM | POA: Diagnosis present

## 2017-07-22 DIAGNOSIS — R3915 Urgency of urination: Secondary | ICD-10-CM | POA: Diagnosis not present

## 2017-07-22 DIAGNOSIS — H3530 Unspecified macular degeneration: Secondary | ICD-10-CM | POA: Diagnosis present

## 2017-07-22 DIAGNOSIS — M899 Disorder of bone, unspecified: Secondary | ICD-10-CM | POA: Diagnosis not present

## 2017-07-22 DIAGNOSIS — R739 Hyperglycemia, unspecified: Secondary | ICD-10-CM | POA: Diagnosis not present

## 2017-07-22 DIAGNOSIS — I493 Ventricular premature depolarization: Secondary | ICD-10-CM | POA: Diagnosis not present

## 2017-07-22 DIAGNOSIS — R1310 Dysphagia, unspecified: Secondary | ICD-10-CM | POA: Diagnosis not present

## 2017-07-22 DIAGNOSIS — E871 Hypo-osmolality and hyponatremia: Secondary | ICD-10-CM | POA: Diagnosis not present

## 2017-07-22 DIAGNOSIS — F329 Major depressive disorder, single episode, unspecified: Secondary | ICD-10-CM | POA: Diagnosis present

## 2017-07-22 DIAGNOSIS — I10 Essential (primary) hypertension: Secondary | ICD-10-CM | POA: Diagnosis present

## 2017-07-22 DIAGNOSIS — Z950 Presence of cardiac pacemaker: Secondary | ICD-10-CM | POA: Diagnosis not present

## 2017-07-22 DIAGNOSIS — Z801 Family history of malignant neoplasm of trachea, bronchus and lung: Secondary | ICD-10-CM | POA: Diagnosis not present

## 2017-07-22 DIAGNOSIS — Z7952 Long term (current) use of systemic steroids: Secondary | ICD-10-CM | POA: Diagnosis not present

## 2017-07-22 DIAGNOSIS — R531 Weakness: Secondary | ICD-10-CM | POA: Diagnosis not present

## 2017-07-22 DIAGNOSIS — R350 Frequency of micturition: Secondary | ICD-10-CM | POA: Diagnosis not present

## 2017-07-22 DIAGNOSIS — R627 Adult failure to thrive: Secondary | ICD-10-CM | POA: Diagnosis present

## 2017-07-22 DIAGNOSIS — Z6824 Body mass index (BMI) 24.0-24.9, adult: Secondary | ICD-10-CM | POA: Diagnosis not present

## 2017-07-22 DIAGNOSIS — E222 Syndrome of inappropriate secretion of antidiuretic hormone: Secondary | ICD-10-CM | POA: Diagnosis present

## 2017-07-22 DIAGNOSIS — Z8249 Family history of ischemic heart disease and other diseases of the circulatory system: Secondary | ICD-10-CM | POA: Diagnosis not present

## 2017-07-22 DIAGNOSIS — Z66 Do not resuscitate: Secondary | ICD-10-CM | POA: Diagnosis not present

## 2017-07-22 DIAGNOSIS — R5383 Other fatigue: Secondary | ICD-10-CM | POA: Diagnosis not present

## 2017-07-22 DIAGNOSIS — J309 Allergic rhinitis, unspecified: Secondary | ICD-10-CM | POA: Diagnosis present

## 2017-07-22 DIAGNOSIS — E785 Hyperlipidemia, unspecified: Secondary | ICD-10-CM | POA: Diagnosis present

## 2017-07-22 DIAGNOSIS — F419 Anxiety disorder, unspecified: Secondary | ICD-10-CM | POA: Diagnosis present

## 2017-07-22 DIAGNOSIS — G8929 Other chronic pain: Secondary | ICD-10-CM | POA: Diagnosis not present

## 2017-07-22 DIAGNOSIS — E032 Hypothyroidism due to medicaments and other exogenous substances: Secondary | ICD-10-CM | POA: Diagnosis not present

## 2017-07-22 DIAGNOSIS — Z8781 Personal history of (healed) traumatic fracture: Secondary | ICD-10-CM | POA: Diagnosis not present

## 2017-07-22 DIAGNOSIS — E861 Hypovolemia: Secondary | ICD-10-CM | POA: Diagnosis present

## 2017-07-22 DIAGNOSIS — E860 Dehydration: Secondary | ICD-10-CM | POA: Diagnosis present

## 2017-07-22 DIAGNOSIS — G893 Neoplasm related pain (acute) (chronic): Secondary | ICD-10-CM | POA: Diagnosis present

## 2017-07-22 DIAGNOSIS — K219 Gastro-esophageal reflux disease without esophagitis: Secondary | ICD-10-CM | POA: Diagnosis present

## 2017-07-22 DIAGNOSIS — I495 Sick sinus syndrome: Secondary | ICD-10-CM | POA: Diagnosis not present

## 2017-07-22 DIAGNOSIS — R638 Other symptoms and signs concerning food and fluid intake: Secondary | ICD-10-CM | POA: Diagnosis not present

## 2017-07-22 DIAGNOSIS — M6281 Muscle weakness (generalized): Secondary | ICD-10-CM | POA: Diagnosis not present

## 2017-07-22 DIAGNOSIS — E890 Postprocedural hypothyroidism: Secondary | ICD-10-CM | POA: Diagnosis present

## 2017-07-22 DIAGNOSIS — C9000 Multiple myeloma not having achieved remission: Secondary | ICD-10-CM | POA: Diagnosis present

## 2017-07-22 DIAGNOSIS — E039 Hypothyroidism, unspecified: Secondary | ICD-10-CM | POA: Diagnosis not present

## 2017-07-29 DIAGNOSIS — L659 Nonscarring hair loss, unspecified: Secondary | ICD-10-CM | POA: Diagnosis not present

## 2017-07-29 DIAGNOSIS — R627 Adult failure to thrive: Secondary | ICD-10-CM | POA: Diagnosis not present

## 2017-07-29 DIAGNOSIS — D538 Other specified nutritional anemias: Secondary | ICD-10-CM | POA: Diagnosis not present

## 2017-07-29 DIAGNOSIS — Z886 Allergy status to analgesic agent status: Secondary | ICD-10-CM | POA: Diagnosis not present

## 2017-07-29 DIAGNOSIS — G8929 Other chronic pain: Secondary | ICD-10-CM | POA: Diagnosis not present

## 2017-07-29 DIAGNOSIS — R7989 Other specified abnormal findings of blood chemistry: Secondary | ICD-10-CM | POA: Diagnosis not present

## 2017-07-29 DIAGNOSIS — R54 Age-related physical debility: Secondary | ICD-10-CM | POA: Diagnosis not present

## 2017-07-29 DIAGNOSIS — Z882 Allergy status to sulfonamides status: Secondary | ICD-10-CM | POA: Diagnosis not present

## 2017-07-29 DIAGNOSIS — C9000 Multiple myeloma not having achieved remission: Secondary | ICD-10-CM | POA: Diagnosis not present

## 2017-07-29 DIAGNOSIS — E871 Hypo-osmolality and hyponatremia: Secondary | ICD-10-CM | POA: Diagnosis not present

## 2017-08-01 DIAGNOSIS — Z6823 Body mass index (BMI) 23.0-23.9, adult: Secondary | ICD-10-CM | POA: Diagnosis not present

## 2017-08-01 DIAGNOSIS — F339 Major depressive disorder, recurrent, unspecified: Secondary | ICD-10-CM | POA: Diagnosis not present

## 2017-08-01 DIAGNOSIS — C9000 Multiple myeloma not having achieved remission: Secondary | ICD-10-CM | POA: Diagnosis not present

## 2017-08-01 DIAGNOSIS — I10 Essential (primary) hypertension: Secondary | ICD-10-CM | POA: Diagnosis not present

## 2017-08-01 DIAGNOSIS — G4452 New daily persistent headache (NDPH): Secondary | ICD-10-CM | POA: Diagnosis not present

## 2017-08-05 DIAGNOSIS — Z5111 Encounter for antineoplastic chemotherapy: Secondary | ICD-10-CM | POA: Diagnosis not present

## 2017-08-05 DIAGNOSIS — Z9221 Personal history of antineoplastic chemotherapy: Secondary | ICD-10-CM | POA: Diagnosis not present

## 2017-08-05 DIAGNOSIS — C9000 Multiple myeloma not having achieved remission: Secondary | ICD-10-CM | POA: Diagnosis not present

## 2017-08-06 DIAGNOSIS — K649 Unspecified hemorrhoids: Secondary | ICD-10-CM | POA: Diagnosis not present

## 2017-08-06 DIAGNOSIS — K5904 Chronic idiopathic constipation: Secondary | ICD-10-CM | POA: Diagnosis not present

## 2017-08-06 DIAGNOSIS — F339 Major depressive disorder, recurrent, unspecified: Secondary | ICD-10-CM | POA: Diagnosis not present

## 2017-08-06 DIAGNOSIS — G8929 Other chronic pain: Secondary | ICD-10-CM | POA: Diagnosis not present

## 2017-08-06 DIAGNOSIS — K224 Dyskinesia of esophagus: Secondary | ICD-10-CM | POA: Diagnosis not present

## 2017-08-06 DIAGNOSIS — R2689 Other abnormalities of gait and mobility: Secondary | ICD-10-CM | POA: Diagnosis not present

## 2017-08-06 DIAGNOSIS — C9000 Multiple myeloma not having achieved remission: Secondary | ICD-10-CM | POA: Diagnosis not present

## 2017-08-06 DIAGNOSIS — M353 Polymyalgia rheumatica: Secondary | ICD-10-CM | POA: Diagnosis not present

## 2017-08-06 DIAGNOSIS — E871 Hypo-osmolality and hyponatremia: Secondary | ICD-10-CM | POA: Diagnosis not present

## 2017-08-06 DIAGNOSIS — R630 Anorexia: Secondary | ICD-10-CM | POA: Diagnosis not present

## 2017-08-06 DIAGNOSIS — R531 Weakness: Secondary | ICD-10-CM | POA: Diagnosis not present

## 2017-08-06 DIAGNOSIS — D630 Anemia in neoplastic disease: Secondary | ICD-10-CM | POA: Diagnosis not present

## 2017-08-06 DIAGNOSIS — R0782 Intercostal pain: Secondary | ICD-10-CM | POA: Diagnosis not present

## 2017-08-06 DIAGNOSIS — R42 Dizziness and giddiness: Secondary | ICD-10-CM | POA: Diagnosis not present

## 2017-08-06 DIAGNOSIS — Z79899 Other long term (current) drug therapy: Secondary | ICD-10-CM | POA: Diagnosis not present

## 2017-08-06 DIAGNOSIS — F5101 Primary insomnia: Secondary | ICD-10-CM | POA: Diagnosis not present

## 2017-08-06 DIAGNOSIS — Z950 Presence of cardiac pacemaker: Secondary | ICD-10-CM | POA: Diagnosis not present

## 2017-08-08 DIAGNOSIS — C9000 Multiple myeloma not having achieved remission: Secondary | ICD-10-CM | POA: Diagnosis not present

## 2017-08-08 DIAGNOSIS — Z950 Presence of cardiac pacemaker: Secondary | ICD-10-CM | POA: Diagnosis not present

## 2017-08-08 DIAGNOSIS — G8929 Other chronic pain: Secondary | ICD-10-CM | POA: Diagnosis not present

## 2017-08-08 DIAGNOSIS — R531 Weakness: Secondary | ICD-10-CM | POA: Diagnosis not present

## 2017-08-08 DIAGNOSIS — Z79899 Other long term (current) drug therapy: Secondary | ICD-10-CM | POA: Diagnosis not present

## 2017-08-08 DIAGNOSIS — D630 Anemia in neoplastic disease: Secondary | ICD-10-CM | POA: Diagnosis not present

## 2017-08-12 DIAGNOSIS — C9000 Multiple myeloma not having achieved remission: Secondary | ICD-10-CM | POA: Diagnosis not present

## 2017-08-12 DIAGNOSIS — Z5111 Encounter for antineoplastic chemotherapy: Secondary | ICD-10-CM | POA: Diagnosis not present

## 2017-08-14 DIAGNOSIS — Z950 Presence of cardiac pacemaker: Secondary | ICD-10-CM | POA: Diagnosis not present

## 2017-08-14 DIAGNOSIS — C9000 Multiple myeloma not having achieved remission: Secondary | ICD-10-CM | POA: Diagnosis not present

## 2017-08-14 DIAGNOSIS — Z79899 Other long term (current) drug therapy: Secondary | ICD-10-CM | POA: Diagnosis not present

## 2017-08-14 DIAGNOSIS — D630 Anemia in neoplastic disease: Secondary | ICD-10-CM | POA: Diagnosis not present

## 2017-08-14 DIAGNOSIS — G8929 Other chronic pain: Secondary | ICD-10-CM | POA: Diagnosis not present

## 2017-08-14 DIAGNOSIS — R531 Weakness: Secondary | ICD-10-CM | POA: Diagnosis not present

## 2017-08-15 DIAGNOSIS — C9000 Multiple myeloma not having achieved remission: Secondary | ICD-10-CM | POA: Diagnosis not present

## 2017-08-15 DIAGNOSIS — D630 Anemia in neoplastic disease: Secondary | ICD-10-CM | POA: Diagnosis not present

## 2017-08-15 DIAGNOSIS — R531 Weakness: Secondary | ICD-10-CM | POA: Diagnosis not present

## 2017-08-15 DIAGNOSIS — Z950 Presence of cardiac pacemaker: Secondary | ICD-10-CM | POA: Diagnosis not present

## 2017-08-15 DIAGNOSIS — Z79899 Other long term (current) drug therapy: Secondary | ICD-10-CM | POA: Diagnosis not present

## 2017-08-15 DIAGNOSIS — G8929 Other chronic pain: Secondary | ICD-10-CM | POA: Diagnosis not present

## 2017-08-19 DIAGNOSIS — E871 Hypo-osmolality and hyponatremia: Secondary | ICD-10-CM | POA: Diagnosis not present

## 2017-08-19 DIAGNOSIS — C9000 Multiple myeloma not having achieved remission: Secondary | ICD-10-CM | POA: Diagnosis not present

## 2017-08-19 DIAGNOSIS — G893 Neoplasm related pain (acute) (chronic): Secondary | ICD-10-CM | POA: Diagnosis not present

## 2017-08-19 DIAGNOSIS — Z79899 Other long term (current) drug therapy: Secondary | ICD-10-CM | POA: Diagnosis not present

## 2017-08-19 DIAGNOSIS — Z79891 Long term (current) use of opiate analgesic: Secondary | ICD-10-CM | POA: Diagnosis not present

## 2017-08-19 DIAGNOSIS — Z9221 Personal history of antineoplastic chemotherapy: Secondary | ICD-10-CM | POA: Diagnosis not present

## 2017-08-19 DIAGNOSIS — R627 Adult failure to thrive: Secondary | ICD-10-CM | POA: Diagnosis not present

## 2017-08-20 DIAGNOSIS — R531 Weakness: Secondary | ICD-10-CM | POA: Diagnosis not present

## 2017-08-20 DIAGNOSIS — C9000 Multiple myeloma not having achieved remission: Secondary | ICD-10-CM | POA: Diagnosis not present

## 2017-08-20 DIAGNOSIS — D630 Anemia in neoplastic disease: Secondary | ICD-10-CM | POA: Diagnosis not present

## 2017-08-20 DIAGNOSIS — G8929 Other chronic pain: Secondary | ICD-10-CM | POA: Diagnosis not present

## 2017-08-20 DIAGNOSIS — Z950 Presence of cardiac pacemaker: Secondary | ICD-10-CM | POA: Diagnosis not present

## 2017-08-20 DIAGNOSIS — Z79899 Other long term (current) drug therapy: Secondary | ICD-10-CM | POA: Diagnosis not present

## 2017-08-22 DIAGNOSIS — D630 Anemia in neoplastic disease: Secondary | ICD-10-CM | POA: Diagnosis not present

## 2017-08-22 DIAGNOSIS — G8929 Other chronic pain: Secondary | ICD-10-CM | POA: Diagnosis not present

## 2017-08-22 DIAGNOSIS — Z950 Presence of cardiac pacemaker: Secondary | ICD-10-CM | POA: Diagnosis not present

## 2017-08-22 DIAGNOSIS — Z79899 Other long term (current) drug therapy: Secondary | ICD-10-CM | POA: Diagnosis not present

## 2017-08-22 DIAGNOSIS — C9000 Multiple myeloma not having achieved remission: Secondary | ICD-10-CM | POA: Diagnosis not present

## 2017-08-22 DIAGNOSIS — R531 Weakness: Secondary | ICD-10-CM | POA: Diagnosis not present

## 2017-08-27 DIAGNOSIS — D630 Anemia in neoplastic disease: Secondary | ICD-10-CM | POA: Diagnosis not present

## 2017-08-27 DIAGNOSIS — Z79899 Other long term (current) drug therapy: Secondary | ICD-10-CM | POA: Diagnosis not present

## 2017-08-27 DIAGNOSIS — Z950 Presence of cardiac pacemaker: Secondary | ICD-10-CM | POA: Diagnosis not present

## 2017-08-27 DIAGNOSIS — R531 Weakness: Secondary | ICD-10-CM | POA: Diagnosis not present

## 2017-08-27 DIAGNOSIS — C9000 Multiple myeloma not having achieved remission: Secondary | ICD-10-CM | POA: Diagnosis not present

## 2017-08-27 DIAGNOSIS — G8929 Other chronic pain: Secondary | ICD-10-CM | POA: Diagnosis not present

## 2017-08-29 DIAGNOSIS — R531 Weakness: Secondary | ICD-10-CM | POA: Diagnosis not present

## 2017-08-29 DIAGNOSIS — D630 Anemia in neoplastic disease: Secondary | ICD-10-CM | POA: Diagnosis not present

## 2017-08-29 DIAGNOSIS — Z79899 Other long term (current) drug therapy: Secondary | ICD-10-CM | POA: Diagnosis not present

## 2017-08-29 DIAGNOSIS — G8929 Other chronic pain: Secondary | ICD-10-CM | POA: Diagnosis not present

## 2017-08-29 DIAGNOSIS — C9000 Multiple myeloma not having achieved remission: Secondary | ICD-10-CM | POA: Diagnosis not present

## 2017-08-29 DIAGNOSIS — Z950 Presence of cardiac pacemaker: Secondary | ICD-10-CM | POA: Diagnosis not present

## 2017-09-02 DIAGNOSIS — Z5111 Encounter for antineoplastic chemotherapy: Secondary | ICD-10-CM | POA: Diagnosis not present

## 2017-09-02 DIAGNOSIS — R627 Adult failure to thrive: Secondary | ICD-10-CM | POA: Diagnosis not present

## 2017-09-02 DIAGNOSIS — C9000 Multiple myeloma not having achieved remission: Secondary | ICD-10-CM | POA: Diagnosis not present

## 2017-09-02 DIAGNOSIS — Z79899 Other long term (current) drug therapy: Secondary | ICD-10-CM | POA: Diagnosis not present

## 2017-09-02 DIAGNOSIS — Z79891 Long term (current) use of opiate analgesic: Secondary | ICD-10-CM | POA: Diagnosis not present

## 2017-09-02 DIAGNOSIS — R52 Pain, unspecified: Secondary | ICD-10-CM | POA: Diagnosis not present

## 2017-09-02 DIAGNOSIS — E871 Hypo-osmolality and hyponatremia: Secondary | ICD-10-CM | POA: Diagnosis not present

## 2017-09-03 DIAGNOSIS — C9000 Multiple myeloma not having achieved remission: Secondary | ICD-10-CM | POA: Diagnosis not present

## 2017-09-03 DIAGNOSIS — Z950 Presence of cardiac pacemaker: Secondary | ICD-10-CM | POA: Diagnosis not present

## 2017-09-03 DIAGNOSIS — D630 Anemia in neoplastic disease: Secondary | ICD-10-CM | POA: Diagnosis not present

## 2017-09-03 DIAGNOSIS — R531 Weakness: Secondary | ICD-10-CM | POA: Diagnosis not present

## 2017-09-03 DIAGNOSIS — Z79899 Other long term (current) drug therapy: Secondary | ICD-10-CM | POA: Diagnosis not present

## 2017-09-03 DIAGNOSIS — G8929 Other chronic pain: Secondary | ICD-10-CM | POA: Diagnosis not present

## 2017-09-06 DIAGNOSIS — Z950 Presence of cardiac pacemaker: Secondary | ICD-10-CM | POA: Diagnosis not present

## 2017-09-06 DIAGNOSIS — G8929 Other chronic pain: Secondary | ICD-10-CM | POA: Diagnosis not present

## 2017-09-06 DIAGNOSIS — R531 Weakness: Secondary | ICD-10-CM | POA: Diagnosis not present

## 2017-09-06 DIAGNOSIS — Z79899 Other long term (current) drug therapy: Secondary | ICD-10-CM | POA: Diagnosis not present

## 2017-09-06 DIAGNOSIS — D630 Anemia in neoplastic disease: Secondary | ICD-10-CM | POA: Diagnosis not present

## 2017-09-06 DIAGNOSIS — C9000 Multiple myeloma not having achieved remission: Secondary | ICD-10-CM | POA: Diagnosis not present

## 2017-09-09 ENCOUNTER — Other Ambulatory Visit: Payer: Self-pay

## 2017-09-09 ENCOUNTER — Encounter (HOSPITAL_COMMUNITY): Payer: Self-pay

## 2017-09-09 ENCOUNTER — Emergency Department (HOSPITAL_COMMUNITY)
Admission: EM | Admit: 2017-09-09 | Discharge: 2017-09-09 | Disposition: A | Discharge status: Home / Self Care | Payer: Medicare Other | Attending: Emergency Medicine | Admitting: Emergency Medicine

## 2017-09-09 ENCOUNTER — Encounter: Payer: Medicare Other | Admitting: Internal Medicine

## 2017-09-09 ENCOUNTER — Ambulatory Visit (INDEPENDENT_AMBULATORY_CARE_PROVIDER_SITE_OTHER): Payer: Medicare Other | Admitting: *Deleted

## 2017-09-09 ENCOUNTER — Emergency Department (HOSPITAL_COMMUNITY): Payer: Medicare Other

## 2017-09-09 DIAGNOSIS — E039 Hypothyroidism, unspecified: Secondary | ICD-10-CM | POA: Diagnosis not present

## 2017-09-09 DIAGNOSIS — Z7982 Long term (current) use of aspirin: Secondary | ICD-10-CM | POA: Diagnosis not present

## 2017-09-09 DIAGNOSIS — R0989 Other specified symptoms and signs involving the circulatory and respiratory systems: Secondary | ICD-10-CM | POA: Diagnosis not present

## 2017-09-09 DIAGNOSIS — R402431 Glasgow coma scale score 3-8, in the field [EMT or ambulance]: Secondary | ICD-10-CM | POA: Diagnosis not present

## 2017-09-09 DIAGNOSIS — Z950 Presence of cardiac pacemaker: Secondary | ICD-10-CM | POA: Diagnosis not present

## 2017-09-09 DIAGNOSIS — I495 Sick sinus syndrome: Secondary | ICD-10-CM

## 2017-09-09 DIAGNOSIS — Z79899 Other long term (current) drug therapy: Secondary | ICD-10-CM | POA: Diagnosis not present

## 2017-09-09 DIAGNOSIS — R404 Transient alteration of awareness: Secondary | ICD-10-CM

## 2017-09-09 DIAGNOSIS — I10 Essential (primary) hypertension: Secondary | ICD-10-CM | POA: Insufficient documentation

## 2017-09-09 DIAGNOSIS — R4182 Altered mental status, unspecified: Secondary | ICD-10-CM | POA: Diagnosis not present

## 2017-09-09 DIAGNOSIS — R4020 Unspecified coma: Secondary | ICD-10-CM | POA: Diagnosis not present

## 2017-09-09 LAB — CBC WITH DIFFERENTIAL/PLATELET
BASOS ABS: 0 10*3/uL (ref 0.0–0.1)
Basophils Relative: 0 %
EOS ABS: 0 10*3/uL (ref 0.0–0.7)
EOS PCT: 0 %
HCT: 32.4 % — ABNORMAL LOW (ref 36.0–46.0)
Hemoglobin: 11.1 g/dL — ABNORMAL LOW (ref 12.0–15.0)
Lymphocytes Relative: 11 %
Lymphs Abs: 1.1 10*3/uL (ref 0.7–4.0)
MCH: 34.5 pg — ABNORMAL HIGH (ref 26.0–34.0)
MCHC: 34.3 g/dL (ref 30.0–36.0)
MCV: 100.6 fL — ABNORMAL HIGH (ref 78.0–100.0)
Monocytes Absolute: 0.6 10*3/uL (ref 0.1–1.0)
Monocytes Relative: 7 %
Neutro Abs: 7.9 10*3/uL — ABNORMAL HIGH (ref 1.7–7.7)
Neutrophils Relative %: 82 %
PLATELETS: 296 10*3/uL (ref 150–400)
RBC: 3.22 MIL/uL — AB (ref 3.87–5.11)
RDW: 16.4 % — ABNORMAL HIGH (ref 11.5–15.5)
WBC: 9.7 10*3/uL (ref 4.0–10.5)

## 2017-09-09 LAB — COMPREHENSIVE METABOLIC PANEL
ALBUMIN: 3.7 g/dL (ref 3.5–5.0)
ALT: 12 U/L — AB (ref 14–54)
AST: 18 U/L (ref 15–41)
Alkaline Phosphatase: 39 U/L (ref 38–126)
Anion gap: 7 (ref 5–15)
BUN: 15 mg/dL (ref 6–20)
CHLORIDE: 94 mmol/L — AB (ref 101–111)
CO2: 29 mmol/L (ref 22–32)
CREATININE: 0.97 mg/dL (ref 0.44–1.00)
Calcium: 8.3 mg/dL — ABNORMAL LOW (ref 8.9–10.3)
GFR calc Af Amer: 57 mL/min — ABNORMAL LOW (ref >60)
GFR calc non Af Amer: 49 mL/min — ABNORMAL LOW (ref >60)
Glucose, Bld: 145 mg/dL — ABNORMAL HIGH (ref 65–99)
Potassium: 3.9 mmol/L (ref 3.5–5.1)
SODIUM: 130 mmol/L — AB (ref 135–145)
Total Bilirubin: 0.6 mg/dL (ref 0.3–1.2)
Total Protein: 5.4 g/dL — ABNORMAL LOW (ref 6.5–8.1)

## 2017-09-09 LAB — URINALYSIS, ROUTINE W REFLEX MICROSCOPIC
BACTERIA UA: NONE SEEN
Bilirubin Urine: NEGATIVE
Glucose, UA: NEGATIVE mg/dL
HGB URINE DIPSTICK: NEGATIVE
Ketones, ur: NEGATIVE mg/dL
Leukocytes, UA: NEGATIVE
NITRITE: NEGATIVE
PROTEIN: 30 mg/dL — AB
Specific Gravity, Urine: 1.011 (ref 1.005–1.030)
pH: 8 (ref 5.0–8.0)

## 2017-09-09 LAB — BRAIN NATRIURETIC PEPTIDE: B Natriuretic Peptide: 135 pg/mL — ABNORMAL HIGH (ref 0.0–100.0)

## 2017-09-09 LAB — TROPONIN I

## 2017-09-09 NOTE — ED Notes (Signed)
EDP at bedside  

## 2017-09-09 NOTE — ED Triage Notes (Signed)
EMS reports pt is a resident of [facility] and staff reported pt was unresponsive.   EMS arrived and reports pt was hard to arouse but was able to get pt awake.  EMS reports pt had received her pain medication approx 1 hour Prior to their arrival.  EMS also reports pt's pacemaker was firing while she was less responsive but stopped when pt became more alert.  Pt supposed to have chemo at [facility] today.  Per ems pt says she wants to go for her treatment today.  Pt c/o headache.

## 2017-09-09 NOTE — ED Notes (Signed)
Pt transported to CT ?

## 2017-09-09 NOTE — Discharge Instructions (Addendum)
No  abnormalities were found during your evaluation here today.  Follow up with your physicians at your regularly scheduled appointments

## 2017-09-09 NOTE — ED Notes (Signed)
Pt returned from CT °

## 2017-09-09 NOTE — ED Provider Notes (Signed)
[facility] EMERGENCY DEPARTMENT Provider Note   CSN: [id number] Arrival date & time: 09/09/17  1057     History   Chief Complaint Chief Complaint  Patient presents with  . Altered Mental Status    HPI [name] is a 84 y.o. female.  Chief complaint is brief period of decreased consciousness.  HPI: [name] is 4.  She has a history of multiple myeloma.  She is followed by oncology at [facility].  Was on treatment last year.  Had a period of "watchful waiting" she restarted treatment 8 weeks ago.  Prior to her restarting of her therapy she had an episode of hyponatremia and weakness.  This was thought to be multifactorial but partially due to SIADH, and poor salt and water intake.  Is been doing well.  She has a long history of pacemaker for symptomatic bradycardia.  Dr. Lovena Le for this and had normal evaluation within 1 year.  Upon discharge from [facility] was taken off of fentanyl patch and placed on 2.5 mg oxycodone.  Apparently was given pain medicine today 1 hour before her episode.  Per family she later had back.  She seemed less responsive to them.  Within 20 to 30 minutes had arrival of paramedics she was awake.  Upon arrival here she is fully awake alert oriented and lucid.     Past Medical History:  Diagnosis Date  . Anemia   . Bradycardia   . Chronic back pain   . Constipation   . Degenerative joint disease   . Diverticulosis   . External hemorrhoids   . GERD (gastroesophageal reflux disease)   . Goiter, unspecified   . Hyperlipidemia   . Hypertension   . Hypothyroidism   . Macular degeneration   . Multiple myeloma ([place]) 03/12/2016  . Osteoporosis   . Pacemaker   . Palpitations   . Polymyalgia rheumatica ([place])   . Seasonal allergies   . Thyrotoxicosis without mention of goiter or other cause, without mention of thyrotoxic crisis or storm     Patient Active Problem List   Diagnosis Date Noted  . Chronic back pain 07/04/2017  . Seasonal allergies  07/04/2017  . Degenerative joint disease 07/04/2017  . Influenza A 07/03/2017  . GERD (gastroesophageal reflux disease) 07/03/2017  . Polymyalgia rheumatica ([place]) 07/03/2017  . Hypokalemia 07/03/2017  . Multiple myeloma ([place]) 03/12/2016  . Thoracic spine fracture ([place]) 06/30/2015  . Anemia 11/22/2014  . Dehydration 11/22/2014  . Fracture, humerus, proximal 11/21/2014  . Fall 11/21/2014  . Closed left hip fracture ([place]) 12/27/2012  . Hyponatremia 12/27/2012  . Dysphagia 10/16/2011  . Constipation 10/16/2011  . Pacemaker 09/03/2011  . Gastroenteritis 07/21/2011  . Hypothyroidism 06/04/2011  . Chest tightness 06/03/2011  . Bradycardia 06/03/2011  . Hypertension 06/03/2011  . Acute renal insufficiency 06/03/2011  . Epigastric abdominal tenderness 06/03/2011  . HYPERLIPIDEMIA, FAMILIAL 01/03/2010  . ANEMIA 01/03/2010  . BRADYARRHYTHMIA 01/03/2010  . PALPITATIONS 01/03/2010    Past Surgical History:  Procedure Laterality Date  . COLONOSCOPY  November 2011   External hemorrhoids, diverticulosis, anal papillae  . COLONOSCOPY N/A 04/01/2014   Procedure: COLONOSCOPY;  Surgeon: Rogene Houston, MD;  [facility];  Service: Endoscopy;  Laterality: N/A;  930  . FEMUR IM NAIL Left 12/29/2012   Procedure: INTRAMEDULLARY (IM) NAIL INTERTROCHANTRIC;  Surgeon: Melina Schools, MD;  Location: [place];  Service: Orthopedics;  Laterality: Left;  . INSERT / REPLACE / REMOVE PACEMAKER    . PERMANENT PACEMAKER  INSERTION N/A 06/07/2011   Procedure: PERMANENT PACEMAKER INSERTION;  Surgeon: Evans Lance, MD;  Location: [facility] CATH LAB;  Service: Cardiovascular;  Laterality: N/A;  . THYROIDECTOMY    . TONSILLECTOMY    . vertebroplasty       OB History   None      Home Medications    Prior to Admission medications   Medication Sig Start Date End Date Taking? Authorizing Provider  acetaminophen (TYLENOL) 500 MG tablet Take 1,000 mg by mouth every 6 (six) hours as needed for mild pain.     Yes [provider]  acyclovir (ZOVIRAX) 400 MG tablet Take 400 mg by mouth 2 (two) times daily.   Yes [provider]  ALPRAZolam (XANAX) 0.25 MG tablet Take 0.25 mg by mouth at bedtime.    Yes [provider]  amLODipine (NORVASC) 5 MG tablet Take 5 mg by mouth at bedtime.  08/11/14  Yes [provider]  aspirin 81 MG tablet Take 81 mg by mouth every evening.    Yes [provider]  Calcium Carbonate-Vitamin D (CALCIUM 600+D) 600-400 MG-UNIT tablet Take 1 tablet by mouth 2 (two) times daily.   Yes [provider]  carvedilol (COREG) 6.25 MG tablet Take 1 tablet (6.25 mg total) by mouth 2 (two) times daily with a meal. 07/02/15  Yes Thurnell Lose, MD  cetaphil (CETAPHIL) cream Apply 1 application topically 2 (two) times daily. DAY AND EVENING SHIFT FOR DRY SKIN   Yes [provider]  cycloSPORINE (RESTASIS) 0.05 % ophthalmic emulsion Place 1 drop into both eyes 2 (two) times daily.   Yes [provider]  diphenhydrAMINE (BENADRYL) 25 MG tablet Take 25 mg by mouth every 6 (six) hours as needed for itching or allergies.   Yes [provider]  docusate sodium (COLACE) 100 MG capsule Take 100 mg by mouth 2 (two) times daily. For constipation 01/01/13  Yes Rai, Ripudeep K, MD  DULoxetine (CYMBALTA) 20 MG capsule Take 1 capsule by mouth every morning. 09/10/16  Yes [provider]  fexofenadine (ALLEGRA) 180 MG tablet Take 180 mg by mouth every morning.   Yes [provider]  fluticasone (FLONASE) 50 MCG/ACT nasal spray Place 1 spray into both nostrils daily as needed for allergies or rhinitis.   Yes [provider]  hydrALAZINE (APRESOLINE) 10 MG tablet Take 10 mg by mouth 2 (two) times daily.   Yes [provider]  hydrocortisone (ANUSOL-HC) 2.5 % rectal cream Place 1 application rectally 2 (two) times daily as needed for hemorrhoids or anal itching.   Yes [provider]    hydrocortisone cream 0.5 % Apply 1 application topically 2 (two) times daily as needed for itching (for rash).   Yes [provider]  Hypertonic Nasal Wash (SINUS RINSE NA) Place into the nose daily as needed (for sinusitis).   Yes [provider]  levothyroxine (SYNTHROID, LEVOTHROID) 75 MCG tablet Take 75 mcg by mouth daily before breakfast.  09/25/16  Yes [provider]  loperamide (IMODIUM) 2 MG capsule Take 2 mg by mouth every 4 (four) hours as needed for diarrhea or loose stools.   Yes [provider]  magnesium hydroxide (MILK OF MAGNESIA) 400 MG/5ML suspension Take by mouth 2 (two) times daily as needed for mild constipation (0.31ms administered as needed).   Yes [provider]  meclizine (ANTIVERT) 25 MG tablet Take 25 mg by mouth every 6 (six) hours as needed for dizziness.   Yes  [provider]  mineral oil liquid Place in ear(s) every Thursday. 2 drops in both ears once daily on Thursdays for Cerumen Impaction   Yes [provider]  multivitamin-lutein (OCUVITE-LUTEIN) CAPS capsule Take 1 capsule by mouth 2 (two) times daily.   Yes [provider]  Olopatadine HCl (PATADAY) 0.2 % SOLN Place 1 drop into both eyes daily.   Yes [provider]  ondansetron (ZOFRAN) 8 MG tablet Take 8 mg by mouth every 8 (eight) hours as needed for nausea or vomiting.   Yes [provider]  oxycodone (OXY-IR) 5 MG capsule Take 5 mg by mouth every 4 (four) hours as needed for pain.   Yes [provider]  pantoprazole (PROTONIX) 40 MG tablet Take 40 mg by mouth daily.   Yes [provider]  polyethylene glycol (MIRALAX / GLYCOLAX) packet Take 17 g by mouth daily.   Yes [provider]  predniSONE (DELTASONE) 5 MG tablet Take 5 mg by mouth daily with breakfast.   Yes [provider]  prochlorperazine (COMPAZINE) 10 MG tablet Take 10 mg by mouth every 6 (six) hours as needed for nausea or  vomiting.   Yes [provider]  senna (SENOKOT) 8.6 MG tablet Take 1 tablet by mouth 2 (two) times daily.   Yes [provider]    Family History Family History  Problem Relation Age of Onset  . Heart attack Mother        Deceased  . Cancer Father        Deceased, colon cancer age 50  . Cancer Brother        Deceased, throat and lung    Social History Social History   Tobacco Use  . Smoking status: Never Smoker  . Smokeless tobacco: Never Used  Substance Use Topics  . Alcohol use: No  . Drug use: No     Allergies   Tramadol and Sulfa antibiotics   Review of Systems Review of Systems  Constitutional: Negative for appetite change, chills, diaphoresis, fatigue and fever.  HENT: Negative for mouth sores, sore throat and trouble swallowing.   Eyes: Negative for visual disturbance.  Respiratory: Negative for cough, chest tightness, shortness of breath and wheezing.   Cardiovascular: Negative for chest pain.  Gastrointestinal: Negative for abdominal distention, abdominal pain, diarrhea, nausea and vomiting.  Endocrine: Negative for polydipsia, polyphagia and polyuria.  Genitourinary: Negative for dysuria, frequency and hematuria.  Musculoskeletal: Negative for gait problem.  Skin: Negative for color change, pallor and rash.  Neurological: Negative for dizziness, syncope, light-headedness and headaches.       Complains of a mild headache  Hematological: Does not bruise/bleed easily.  Psychiatric/Behavioral: Negative for behavioral problems and confusion.     Physical Exam Updated Vital Signs BP (!) 147/58   Pulse 63   Temp 98.5 F (36.9 C) (Oral)   Resp 17   SpO2 96%   Physical Exam  Constitutional: She is oriented to person, place, and time. She appears well-developed and well-nourished. No distress.  HENT:  Head: Normocephalic.  Eyes: Pupils are equal, round, and reactive to light. Conjunctivae are normal. No scleral icterus.  Neck: Normal  range of motion. Neck supple. No thyromegaly present.  Cardiovascular: Normal rate and regular rhythm. Exam reveals no gallop and no friction rub.  No murmur heard. Pulmonary/Chest: Effort normal and breath sounds normal. No respiratory distress. She has no wheezes. She has no rales.  Abdominal: Soft. Bowel sounds are normal. She exhibits no distension.  There is no tenderness. There is no rebound.  Musculoskeletal: Normal range of motion.  Neurological: She is alert and oriented to person, place, and time.  Oriented and lucid.  Symmetric cranial nerve exam.  No pronator drift or leg drift.  Skin: Skin is warm and dry. No rash noted.  Psychiatric: She has a normal mood and affect. Her behavior is normal.     ED Treatments / Results  Labs (all labs ordered are listed, but only abnormal results are displayed) Labs Reviewed  CBC WITH DIFFERENTIAL/PLATELET - Abnormal; Notable for the following components:      Result Value   RBC 3.22 (*)    Hemoglobin 11.1 (*)    HCT 32.4 (*)    MCV 100.6 (*)    MCH 34.5 (*)    RDW 16.4 (*)    Neutro Abs 7.9 (*)    All other components within normal limits  COMPREHENSIVE METABOLIC PANEL - Abnormal; Notable for the following components:   Sodium 130 (*)    Chloride 94 (*)    Glucose, Bld 145 (*)    Calcium 8.3 (*)    Total Protein 5.4 (*)    ALT 12 (*)    GFR calc non Af Amer 49 (*)    GFR calc Af Amer 57 (*)    All other components within normal limits  URINALYSIS, ROUTINE W REFLEX MICROSCOPIC - Abnormal; Notable for the following components:   Protein, ur 30 (*)    All other components within normal limits  BRAIN NATRIURETIC PEPTIDE - Abnormal; Notable for the following components:   B Natriuretic Peptide 135.0 (*)    All other components within normal limits  URINE CULTURE  TROPONIN I    EKG EKG Interpretation  Date/Time:  Monday Sep 09 2017 11:14:52 EDT Ventricular Rate:  69 PR Interval:    QRS Duration: 93 QT Interval:  429 QTC  Calculation: 460 R Axis:   47 Text Interpretation:  Atrial-paced complexes Borderline prolonged PR interval Low voltage, precordial leads Confirmed by Tanna Furry [phone]) on 09/09/2017 2:38:46 PM   Radiology Ct Head Wo Contrast  Result Date: 09/09/2017 CLINICAL DATA:  Found unresponsive today. History of multiple myeloma. EXAM: CT HEAD WITHOUT CONTRAST TECHNIQUE: Contiguous axial images were obtained from the base of the skull through the vertex without intravenous contrast. COMPARISON:  11/21/2014 FINDINGS: Brain: There is no evidence of acute large territory infarct, intracranial hemorrhage, mass, midline shift, or extra-axial fluid collection. There is slight interval diffuse enlargement of the lateral ventricles compared to the prior study with only minimal dilatation of the temporal horns. Mild periventricular white matter hypodensities are new. Vascular: Calcified atherosclerosis at the skull base. No hyperdense vessel. Skull: No fracture or focal osseous lesion. Sinuses/Orbits: Partially visualize chronic left maxillary sinus opacification. No significant mass scratched a trace right mastoid effusion. Bilateral cataract extraction. Other: None. IMPRESSION: 1. No definite acute intracranial abnormality. 2. New periventricular white matter hypoattenuation, nonspecific but may be secondary to chemotherapy. Slight ventricular enlargement is favored to reflect cerebral atrophy with hydrocephalus and transependymal CSF flow an alternative consideration. Electronically Signed   By: Logan Bores M.D.   On: 09/09/2017 14:55   Dg Chest Port 1 View  Result Date: 09/09/2017 CLINICAL DATA:  Decreased responsiveness. EXAM: PORTABLE CHEST 1 VIEW COMPARISON:  July 03, 2017 FINDINGS: Mandible obscures portions of each lung apex there is mild stable interstitial pulmonary edema. No consolidation. There is cardiomegaly with pulmonary venous hypertension. Pacemaker leads are attached to the  right atrium and right  ventricle. No adenopathy. Bones are osteoporotic. There is evidence of an old healed fracture in the proximal left humeral metaphysis. IMPRESSION: Pulmonary vascular congestion with mild chronic interstitial edema. Question a degree of chronic congestive heart failure. No consolidation. Cardiac silhouette is stable in appearance. No evident adenopathy. Pacemaker leads attached to right atrium and right ventricle. Bones osteoporotic. Electronically Signed   By: Lowella Grip III M.D.   On: 09/09/2017 13:08    Procedures Procedures (including critical care time)  Medications Ordered in ED Medications - No data to display   Initial Impression / Assessment and Plan / ED Course  I have reviewed the triage vital signs and the nursing notes.  Pertinent labs & imaging results that were available during my care of the patient were reviewed by me and considered in my medical decision making (see chart for details).    EKG Interpretation  Date/Time:  Monday Sep 09 2017 11:14:52 EDT Ventricular Rate:  69 PR Interval:    QRS Duration: 93 QT Interval:  429 QTC Calculation: 460 R Axis:   47 Text Interpretation:  Atrial-paced complexes Borderline prolonged PR interval Low voltage, precordial leads Confirmed by Tanna Furry [phone]) on 09/09/2017 2:38:46 PM   I will double check with family and per her MAR her recent medication usage.  If she is back on fentanyl, and on 5 mg oxycodone this may be medication side effects.  Doubt TIA or cardiac event.  Will check urine.  Await labs.  Check sodium.  Reevaluation.  3:04 PM: No acute findings noted.  Sodium 130.  No acute abnormalities of CT.  Urine does not appear infected.  Pacemaker check shows normal function.  Appropriate for discharge.  Recheck any changes.  Continue routine follow-up  Final Clinical Impressions(s) / ED Diagnoses   Final diagnoses:  Transient alteration of awareness    ED Discharge Orders    None       Tanna Furry,  MD 09/09/17 1504

## 2017-09-09 NOTE — ED Notes (Signed)
Phlebotomy at bedside.

## 2017-09-09 NOTE — ED Notes (Signed)
Pt's family arrived and is at bedside. 

## 2017-09-10 ENCOUNTER — Encounter: Payer: Self-pay | Admitting: Cardiology

## 2017-09-10 LAB — CUP PACEART REMOTE DEVICE CHECK
Battery Remaining Percentage: 73 %
Brady Statistic AP VP Percent: 1 %
Brady Statistic AP VS Percent: 59 %
Brady Statistic AS VP Percent: 1 %
Brady Statistic AS VS Percent: 41 %
Brady Statistic RV Percent Paced: 1 %
Implantable Lead Implant Date: 20130214
Implantable Lead Implant Date: 20130214
Implantable Lead Location: 753860
Lead Channel Impedance Value: 430 Ohm
Lead Channel Pacing Threshold Amplitude: 0.75 V
Lead Channel Pacing Threshold Amplitude: 1.25 V
Lead Channel Pacing Threshold Pulse Width: 0.5 ms
Lead Channel Sensing Intrinsic Amplitude: 12 mV
Lead Channel Sensing Intrinsic Amplitude: 3.9 mV
Lead Channel Setting Pacing Amplitude: 2 V
Lead Channel Setting Pacing Amplitude: 2.5 V
Lead Channel Setting Pacing Pulse Width: 0.5 ms
MDC IDC LEAD LOCATION: 753859
MDC IDC MSMT BATTERY REMAINING LONGEVITY: 82 mo
MDC IDC MSMT BATTERY VOLTAGE: 2.92 V
MDC IDC MSMT LEADCHNL RA IMPEDANCE VALUE: 380 Ohm
MDC IDC MSMT LEADCHNL RA PACING THRESHOLD PULSEWIDTH: 0.5 ms
MDC IDC PG IMPLANT DT: 20130214
MDC IDC SESS DTM: 20190520170952
MDC IDC SET LEADCHNL RV SENSING SENSITIVITY: 2 mV
MDC IDC STAT BRADY RA PERCENT PACED: 54 %
Pulse Gen Model: 2210
Pulse Gen Serial Number: 7316836

## 2017-09-10 NOTE — Progress Notes (Signed)
Remote pacemaker transmission.   

## 2017-09-11 DIAGNOSIS — Z79899 Other long term (current) drug therapy: Secondary | ICD-10-CM | POA: Diagnosis not present

## 2017-09-11 DIAGNOSIS — R531 Weakness: Secondary | ICD-10-CM | POA: Diagnosis not present

## 2017-09-11 DIAGNOSIS — D630 Anemia in neoplastic disease: Secondary | ICD-10-CM | POA: Diagnosis not present

## 2017-09-11 DIAGNOSIS — C9000 Multiple myeloma not having achieved remission: Secondary | ICD-10-CM | POA: Diagnosis not present

## 2017-09-11 DIAGNOSIS — Z950 Presence of cardiac pacemaker: Secondary | ICD-10-CM | POA: Diagnosis not present

## 2017-09-11 DIAGNOSIS — G8929 Other chronic pain: Secondary | ICD-10-CM | POA: Diagnosis not present

## 2017-09-11 LAB — URINE CULTURE

## 2017-09-25 DIAGNOSIS — M79671 Pain in right foot: Secondary | ICD-10-CM | POA: Diagnosis not present

## 2017-09-25 DIAGNOSIS — M79672 Pain in left foot: Secondary | ICD-10-CM | POA: Diagnosis not present

## 2017-09-25 DIAGNOSIS — I739 Peripheral vascular disease, unspecified: Secondary | ICD-10-CM | POA: Diagnosis not present

## 2017-09-25 DIAGNOSIS — L110 Acquired keratosis follicularis: Secondary | ICD-10-CM | POA: Diagnosis not present

## 2017-09-25 NOTE — Progress Notes (Signed)
Cardiology Office Note Date:  09/26/2017  Patient ID:  Marilyn, King 20-Mar-1923, MRN [id number] PCP:  Celene Squibb, MD  Electrophysiologist:  Dr. Lovena Le    Chief Complaint: hospital f/u  History of Present Illness: [name] is a 84 y.o. female with history of HTN, HLD, GERD, hypothyroidism, macular degeneration, multiple myeloma, and symptomatic bradycardia w/PPM, and PAF.  She comes in today to be seen for Dr. Lovena Le.  She saw him last in June 2018.  At that time she was doing well, discussed perhaps stopping norvasc if BP remained low, also mentioned that she had not had AF and no need for a/c.  In review of older notes, mentioning only 2 minutes of AF had been observed, given she had falls with hx of broken shoulder, pelvis, and ribs, felt at risk for full a/c.  09/09/17 had an ER visit was decreased LOC, this was suspect to be 2/2 pain med, not TIA. Pacer check was reported as done and normal.  She follows with Heme-onc at [facility], in review of her last visit there (09/02/17), she had been deteriorating with debilitation pain/spasms thought to be 2/2 her treatment, with cessation and addition of therapy she has significant improvement in her QOL at the SNF where she resides.  She had a hospital stay march time for the flu and hyponatremia (son reports this was associated as well with a fainting episode), about that time imaging noted progression of her M. Myeloma, and she was started on bortezamib and decadron at reduced doses and at her f/u was doing pretty well.  Last remote noted AMS episodes, available EGMs were very brief 1:1 only   The [patient feels her fainting episode was as well 2/2 her pain medicine.  She had been feeling generally very weak and sleepy that morning and while seated for her meal she passed out.  Her son who is with her was called as well as 911, he was about 5 minutes away and was there quickly.  He reports she was not arousable.  He remembers EMS said  her BP was 65/38.  The patient recalls becoming aware of her surroundings with EMS, her son states by the time she was arriving to the hospital "back to herself"  Since then the patient feels well. She denies any symptoms of milder nature and no near syncope either since that day.   Device information: SJM dual chamber PPM implanted 06/07/11    Past Medical History:  Diagnosis Date  . Anemia   . Bradycardia   . Chronic back pain   . Constipation   . Degenerative joint disease   . Diverticulosis   . External hemorrhoids   . GERD (gastroesophageal reflux disease)   . Goiter, unspecified   . Hyperlipidemia   . Hypertension   . Hypothyroidism   . Macular degeneration   . Multiple myeloma ([place]) 03/12/2016  . Osteoporosis   . Pacemaker   . Palpitations   . Polymyalgia rheumatica ([place])   . Seasonal allergies   . Thyrotoxicosis without mention of goiter or other cause, without mention of thyrotoxic crisis or storm     Past Surgical History:  Procedure Laterality Date  . COLONOSCOPY  November 2011   External hemorrhoids, diverticulosis, anal papillae  . COLONOSCOPY N/A 04/01/2014   Procedure: COLONOSCOPY;  Surgeon: Rogene Houston, MD;  [facility];  Service: Endoscopy;  Laterality: N/A;  930  . FEMUR IM NAIL Left 12/29/2012   Procedure:  INTRAMEDULLARY (IM) NAIL INTERTROCHANTRIC;  Surgeon: Melina Schools, MD;  Location: [place];  Service: Orthopedics;  Laterality: Left;  . INSERT / REPLACE / REMOVE PACEMAKER    . PERMANENT PACEMAKER INSERTION N/A 06/07/2011   Procedure: PERMANENT PACEMAKER INSERTION;  Surgeon: Evans Lance, MD;  Location[facility] CATH LAB;  Service: Cardiovascular;  Laterality: N/A;  . THYROIDECTOMY    . TONSILLECTOMY    . vertebroplasty      Current Outpatient Medications  Medication Sig Dispense Refill  . acetaminophen (TYLENOL) 500 MG tablet Take 1,000 mg by mouth every 6 (six) hours as needed for mild pain.     Marland Kitchen acyclovir (ZOVIRAX) 400 MG tablet Take  400 mg by mouth 2 (two) times daily.    Marland Kitchen ALPRAZolam (XANAX) 0.25 MG tablet Take 0.25 mg by mouth at bedtime.     Marland Kitchen amLODipine (NORVASC) 5 MG tablet Take 5 mg by mouth at bedtime.     Marland Kitchen aspirin 81 MG tablet Take 81 mg by mouth every evening.     . Calcium Carbonate-Vitamin D (CALCIUM 600+D) 600-400 MG-UNIT tablet Take 1 tablet by mouth 2 (two) times daily.    . carvedilol (COREG) 6.25 MG tablet Take 1 tablet (6.25 mg total) by mouth 2 (two) times daily with a meal. 60 tablet 0  . cetaphil (CETAPHIL) cream Apply 1 application topically 2 (two) times daily. DAY AND EVENING SHIFT FOR DRY SKIN    . cycloSPORINE (RESTASIS) 0.05 % ophthalmic emulsion Place 1 drop into both eyes 2 (two) times daily.    . diphenhydrAMINE (BENADRYL) 25 MG tablet Take 25 mg by mouth every 6 (six) hours as needed for itching or allergies.    Marland Kitchen docusate sodium (COLACE) 100 MG capsule Take 100 mg by mouth 2 (two) times daily. For constipation    . DULoxetine (CYMBALTA) 20 MG capsule Take 1 capsule by mouth every morning.    . fexofenadine (ALLEGRA) 180 MG tablet Take 180 mg by mouth every morning.    . fluticasone (FLONASE) 50 MCG/ACT nasal spray Place 1 spray into both nostrils daily as needed for allergies or rhinitis.    . hydrALAZINE (APRESOLINE) 10 MG tablet Take 10 mg by mouth 2 (two) times daily.    . hydrocortisone (ANUSOL-HC) 2.5 % rectal cream Place 1 application rectally 2 (two) times daily as needed for hemorrhoids or anal itching.    . hydrocortisone cream 0.5 % Apply 1 application topically 2 (two) times daily as needed for itching (for rash).    . Hypertonic Nasal Wash (SINUS RINSE NA) Place into the nose daily as needed (for sinusitis).    Marland Kitchen levothyroxine (SYNTHROID, LEVOTHROID) 75 MCG tablet Take 75 mcg by mouth daily before breakfast.     . loperamide (IMODIUM) 2 MG capsule Take 2 mg by mouth every 4 (four) hours as needed for diarrhea or loose stools.    . magnesium hydroxide (MILK OF MAGNESIA) 400 MG/5ML  suspension Take by mouth 2 (two) times daily as needed for mild constipation (0.44ms administered as needed).    . meclizine (ANTIVERT) 25 MG tablet Take 25 mg by mouth every 6 (six) hours as needed for dizziness.    . mineral oil liquid Place in ear(s) every Thursday. 2 drops in both ears once daily on Thursdays for Cerumen Impaction    . multivitamin-lutein (OCUVITE-LUTEIN) CAPS capsule Take 1 capsule by mouth 2 (two) times daily.    . Olopatadine HCl (PATADAY) 0.2 % SOLN Place 1 drop into both eyes  daily.    . ondansetron (ZOFRAN) 8 MG tablet Take 8 mg by mouth every 8 (eight) hours as needed for nausea or vomiting.    Marland Kitchen oxycodone (OXY-IR) 5 MG capsule Take 5 mg by mouth every 4 (four) hours as needed for pain.    . pantoprazole (PROTONIX) 40 MG tablet Take 40 mg by mouth daily.    . polyethylene glycol (MIRALAX / GLYCOLAX) packet Take 17 g by mouth daily.    . predniSONE (DELTASONE) 5 MG tablet Take 5 mg by mouth daily with breakfast.    . prochlorperazine (COMPAZINE) 10 MG tablet Take 10 mg by mouth every 6 (six) hours as needed for nausea or vomiting.    . senna (SENOKOT) 8.6 MG tablet Take 1 tablet by mouth 2 (two) times daily.     No current facility-administered medications for this visit.     Allergies:   Tramadol and Sulfa antibiotics   Social History:  The patient  reports that she has never smoked. She has never used smokeless tobacco. She reports that she does not drink alcohol or use drugs.   Family History:  The patient's family history includes Cancer in her brother and father; Heart attack in her mother.  ROS:  Please see the history of present illness.   All other systems are reviewed and otherwise negative.   PHYSICAL EXAM:  VS:  BP 110/68   Pulse 76   Ht 5' 3"  (1.6 m)   Wt 129 lb (58.5 kg)   BMI 22.85 kg/m  BMI: Body mass index is 22.85 kg/m. Well nourished, well developed, in no acute distress  HEENT: normocephalic, atraumatic  Neck: no JVD, carotid bruits or  masses Cardiac:  RRR; no significant murmurs, no rubs, or gallops Lungs:  CTA b/l, no wheezing, rhonchi or rales  Abd: soft, nontender MS: no deformity, + age appropriate atrophy Ext: no edema  Skin: warm and dry, no rash Neuro:  No gross deficits appreciated Psych: euthymic mood, full affect  PPM site is stable, no tethering or discomfort   EKG:  SR, 68bpm, no acute looking changes PPM interrogation done today and reviewed by myself: battery and lead measurements are good, she is not device dependent, AP 53%, VP <1%, she has had 21 mode switch episodes, all are biref 1:1, longest 2 minutes, no AF, no VT No events on 09/09/17  06/04/11: TTE Study Conclusions - Left ventricle: Mild to moderate LVH with disproportionate septal thickening. Systolic function was hyperdynamic. The estimated ejection fraction was in the range of 70% to 75%. Wall motion was normal; there were no regional wall motion abnormalities. - Aortic valve: Mildly calcified annulus. Mildly calcified leaflets. Trivial regurgitation. - Left atrium: The atrium was mildly to moderately dilated. - Right atrium: The atrium was mildly to moderately dilated. - Atrial septum: No defect or patent foramen ovale was identified. - Pulmonary arteries: PA peak pressure: 3m Hg (S). Impressions - Compared to the prior study performed 12/29/09, there has been no significant interval change.  Recent Labs: 07/03/2017: Magnesium 2.0 09/09/2017: ALT 12; B Natriuretic Peptide 135.0; BUN 15; Creatinine, Ser 0.97; Hemoglobin 11.1; Platelets 296; Potassium 3.9; Sodium 130  No results found for requested labs within last 8760 hours.   Estimated Creatinine Clearance: 30.6 mL/min (by C-G formula based on SCr of 0.97 mg/dL).   Wt Readings from Last 3 Encounters:  09/26/17 129 lb (58.5 kg)  07/04/17 134 lb 4.2 oz (60.9 kg)  04/09/17 135 lb 6.4 oz (61.4 kg)  Other studies reviewed: Additional studies/records reviewed  today include: summarized above  ASSESSMENT AND PLAN:  1. PPM     Intact function  2. HTN     Looks good, no changes  3. PAF     CHA2DS2Vasc is 4, not on a/c 2/2 falls w/trauma     In review of last remotes and today, brief 1;1 episodes only are noted  4. Syncope     Suspect at ER to have been 2/2 pain meds The patient reports Tylenol does just as well for her and has not had any pain medicine since her ER visit. No recurrent symptoms Son recalls EMS stating her BP on their arrival was 65/38 She was seated with her event, though orthostatics checked toady and were negative  Pacer function is intact, she is not device dependent, no arrhythmias to explain event.   Son reports vitals are done twice daily at SNF, recommend if BP regularly <110/60 to reduce her BP therapies.  He states she has a MD there that keeps track of it.    Disposition: F/u with [place] in 3 months, sooner if needed  Current medicines are reviewed at length with the patient today.  The patient did not have any concerns regarding medicines.  Venetia Night, PA-C 09/26/2017 12:48 PM     [place] [address] [phone] (office)  [phone] (fax)

## 2017-09-26 ENCOUNTER — Telehealth: Payer: Self-pay | Admitting: *Deleted

## 2017-09-26 ENCOUNTER — Ambulatory Visit (INDEPENDENT_AMBULATORY_CARE_PROVIDER_SITE_OTHER): Payer: Medicare Other | Admitting: Physician Assistant

## 2017-09-26 VITALS — BP 110/68 | HR 76 | Ht 63.0 in | Wt 129.0 lb

## 2017-09-26 DIAGNOSIS — I10 Essential (primary) hypertension: Secondary | ICD-10-CM | POA: Diagnosis not present

## 2017-09-26 DIAGNOSIS — R001 Bradycardia, unspecified: Secondary | ICD-10-CM | POA: Diagnosis not present

## 2017-09-26 DIAGNOSIS — I480 Paroxysmal atrial fibrillation: Secondary | ICD-10-CM

## 2017-09-26 DIAGNOSIS — R55 Syncope and collapse: Secondary | ICD-10-CM

## 2017-09-26 DIAGNOSIS — Z950 Presence of cardiac pacemaker: Secondary | ICD-10-CM | POA: Diagnosis not present

## 2017-09-26 NOTE — Patient Instructions (Addendum)
Medication Instructions:   Your physician recommends that you continue on your current medications as directed. Please refer to the Current Medication list given to you today.   If you need a refill on your cardiac medications before your next appointment, please call your pharmacy.  Labwork:  NONE ORDERED  TODAY   Testing/Procedures: NONE ORDERED  TODAY    Follow-Up:   IN 3 MONTHS WITH DR Lovena Le   Remote monitoring is used to monitor your Pacemaker of ICD from home. This monitoring reduces the number of office visits required to check your device to one time per year. It allows [place] to keep an eye on the functioning of your device to ensure it is working properly. You are scheduled for a device check from home on .  12-09-17 You may send your transmission at any time that day. If you have a wireless device, the transmission will be sent automatically. After your physician reviews your transmission, you will receive a postcard with your next transmission date.        Any Other Special Instructions Will Be Listed Below (If Applicable).

## 2017-09-26 NOTE — Telephone Encounter (Signed)
LMOVM OF APPOINTMENT NEEDING TO BE RESCHEDULE DUE  MULTIPLE ATTEMPTS OF  NOT BEING ABLE TO BE LOCATED IN OFFICE TO COME TO BACK TO THE CLINIC FOR VISIT.  WHICH  LATER WAS CORRECTED DUE TO BEING IN WRONG  AREA  TO BE SEEN.

## 2017-09-30 NOTE — Addendum Note (Signed)
Addended by: Claude Manges on: 09/30/2017 07:41 AM   Modules accepted: Orders

## 2017-10-07 DIAGNOSIS — I10 Essential (primary) hypertension: Secondary | ICD-10-CM | POA: Diagnosis not present

## 2017-10-07 DIAGNOSIS — C9000 Multiple myeloma not having achieved remission: Secondary | ICD-10-CM | POA: Diagnosis not present

## 2017-10-07 DIAGNOSIS — K5904 Chronic idiopathic constipation: Secondary | ICD-10-CM | POA: Diagnosis not present

## 2017-10-07 DIAGNOSIS — R42 Dizziness and giddiness: Secondary | ICD-10-CM | POA: Diagnosis not present

## 2017-10-07 DIAGNOSIS — R2689 Other abnormalities of gait and mobility: Secondary | ICD-10-CM | POA: Diagnosis not present

## 2017-10-07 DIAGNOSIS — M353 Polymyalgia rheumatica: Secondary | ICD-10-CM | POA: Diagnosis not present

## 2017-10-07 DIAGNOSIS — F339 Major depressive disorder, recurrent, unspecified: Secondary | ICD-10-CM | POA: Diagnosis not present

## 2017-10-07 DIAGNOSIS — E871 Hypo-osmolality and hyponatremia: Secondary | ICD-10-CM | POA: Diagnosis not present

## 2017-10-07 DIAGNOSIS — R0782 Intercostal pain: Secondary | ICD-10-CM | POA: Diagnosis not present

## 2017-10-07 DIAGNOSIS — W19XXXA Unspecified fall, initial encounter: Secondary | ICD-10-CM | POA: Diagnosis not present

## 2017-10-07 DIAGNOSIS — D649 Anemia, unspecified: Secondary | ICD-10-CM | POA: Diagnosis not present

## 2017-10-07 DIAGNOSIS — F5101 Primary insomnia: Secondary | ICD-10-CM | POA: Diagnosis not present

## 2017-10-19 DIAGNOSIS — H6120 Impacted cerumen, unspecified ear: Secondary | ICD-10-CM | POA: Diagnosis not present

## 2017-10-28 DIAGNOSIS — Z5111 Encounter for antineoplastic chemotherapy: Secondary | ICD-10-CM | POA: Diagnosis not present

## 2017-10-28 DIAGNOSIS — C9000 Multiple myeloma not having achieved remission: Secondary | ICD-10-CM | POA: Diagnosis not present

## 2017-10-28 DIAGNOSIS — E871 Hypo-osmolality and hyponatremia: Secondary | ICD-10-CM | POA: Diagnosis not present

## 2017-10-28 DIAGNOSIS — R627 Adult failure to thrive: Secondary | ICD-10-CM | POA: Diagnosis not present

## 2017-10-28 DIAGNOSIS — Z79899 Other long term (current) drug therapy: Secondary | ICD-10-CM | POA: Diagnosis not present

## 2017-10-28 DIAGNOSIS — R944 Abnormal results of kidney function studies: Secondary | ICD-10-CM | POA: Diagnosis not present

## 2017-11-07 DIAGNOSIS — H35351 Cystoid macular degeneration, right eye: Secondary | ICD-10-CM | POA: Diagnosis not present

## 2017-11-07 DIAGNOSIS — H353134 Nonexudative age-related macular degeneration, bilateral, advanced atrophic with subfoveal involvement: Secondary | ICD-10-CM | POA: Diagnosis not present

## 2017-11-07 DIAGNOSIS — H353221 Exudative age-related macular degeneration, left eye, with active choroidal neovascularization: Secondary | ICD-10-CM | POA: Diagnosis not present

## 2017-11-07 DIAGNOSIS — H35352 Cystoid macular degeneration, left eye: Secondary | ICD-10-CM | POA: Diagnosis not present

## 2017-11-20 DIAGNOSIS — N390 Urinary tract infection, site not specified: Secondary | ICD-10-CM | POA: Diagnosis not present

## 2017-11-21 DIAGNOSIS — C9000 Multiple myeloma not having achieved remission: Secondary | ICD-10-CM | POA: Diagnosis not present

## 2017-11-25 DIAGNOSIS — R627 Adult failure to thrive: Secondary | ICD-10-CM | POA: Diagnosis not present

## 2017-11-25 DIAGNOSIS — D6481 Anemia due to antineoplastic chemotherapy: Secondary | ICD-10-CM | POA: Diagnosis not present

## 2017-11-25 DIAGNOSIS — Z6824 Body mass index (BMI) 24.0-24.9, adult: Secondary | ICD-10-CM | POA: Diagnosis not present

## 2017-11-25 DIAGNOSIS — C9000 Multiple myeloma not having achieved remission: Secondary | ICD-10-CM | POA: Diagnosis not present

## 2017-11-25 DIAGNOSIS — E871 Hypo-osmolality and hyponatremia: Secondary | ICD-10-CM | POA: Diagnosis not present

## 2017-12-09 ENCOUNTER — Ambulatory Visit (INDEPENDENT_AMBULATORY_CARE_PROVIDER_SITE_OTHER): Payer: Medicare Other | Admitting: *Deleted

## 2017-12-09 DIAGNOSIS — R001 Bradycardia, unspecified: Secondary | ICD-10-CM

## 2017-12-10 ENCOUNTER — Encounter: Payer: Self-pay | Admitting: Internal Medicine

## 2017-12-10 NOTE — Progress Notes (Signed)
Remote pacemaker transmission.   

## 2017-12-11 DIAGNOSIS — Z6824 Body mass index (BMI) 24.0-24.9, adult: Secondary | ICD-10-CM | POA: Diagnosis not present

## 2017-12-11 DIAGNOSIS — N764 Abscess of vulva: Secondary | ICD-10-CM | POA: Diagnosis not present

## 2017-12-30 DIAGNOSIS — E871 Hypo-osmolality and hyponatremia: Secondary | ICD-10-CM | POA: Diagnosis not present

## 2017-12-30 DIAGNOSIS — C9000 Multiple myeloma not having achieved remission: Secondary | ICD-10-CM | POA: Diagnosis not present

## 2017-12-30 DIAGNOSIS — R627 Adult failure to thrive: Secondary | ICD-10-CM | POA: Diagnosis not present

## 2017-12-30 DIAGNOSIS — Z79899 Other long term (current) drug therapy: Secondary | ICD-10-CM | POA: Diagnosis not present

## 2018-01-01 ENCOUNTER — Ambulatory Visit (INDEPENDENT_AMBULATORY_CARE_PROVIDER_SITE_OTHER): Payer: Medicare Other | Admitting: Internal Medicine

## 2018-01-01 ENCOUNTER — Encounter: Payer: Self-pay | Admitting: Internal Medicine

## 2018-01-01 VITALS — BP 146/60 | HR 65 | Ht 63.0 in | Wt 134.6 lb

## 2018-01-01 DIAGNOSIS — I495 Sick sinus syndrome: Secondary | ICD-10-CM | POA: Diagnosis not present

## 2018-01-01 DIAGNOSIS — Z950 Presence of cardiac pacemaker: Secondary | ICD-10-CM

## 2018-01-01 DIAGNOSIS — I10 Essential (primary) hypertension: Secondary | ICD-10-CM | POA: Diagnosis not present

## 2018-01-01 DIAGNOSIS — I480 Paroxysmal atrial fibrillation: Secondary | ICD-10-CM | POA: Diagnosis not present

## 2018-01-01 NOTE — Progress Notes (Signed)
HPI [name] returns today for followup. She is a very pleasant 84 year old woman with a history of hypertension, palpitations, symptomatic bradycardia, status post permanent pacemaker insertion. She has been stable in the past year except for back pain. She has had 3 vertebralplasties. Minimal edema. No syncope. No chest pain or sob. She remains sedentary and uses a walker.  Allergies  Allergen Reactions  . Tramadol Itching and Nausea Only  . Sulfa Antibiotics Nausea And Vomiting     Current Outpatient Medications  Medication Sig Dispense Refill  . acetaminophen (TYLENOL) 500 MG tablet Take 1,000 mg by mouth every 6 (six) hours as needed for mild pain.     Marland Kitchen acyclovir (ZOVIRAX) 400 MG tablet Take 400 mg by mouth 2 (two) times daily.    Marland Kitchen ALPRAZolam (XANAX) 0.25 MG tablet Take 0.25 mg by mouth at bedtime.     Marland Kitchen amLODipine (NORVASC) 5 MG tablet Take 5 mg by mouth at bedtime.     Marland Kitchen aspirin 81 MG tablet Take 81 mg by mouth every evening.     . Calcium Carbonate-Vitamin D (CALCIUM 600+D) 600-400 MG-UNIT tablet Take 1 tablet by mouth 2 (two) times daily.    . carvedilol (COREG) 6.25 MG tablet Take 1 tablet (6.25 mg total) by mouth 2 (two) times daily with a meal. 60 tablet 0  . cetaphil (CETAPHIL) cream Apply 1 application topically 2 (two) times daily. DAY AND EVENING SHIFT FOR DRY SKIN    . cycloSPORINE (RESTASIS) 0.05 % ophthalmic emulsion Place 1 drop into both eyes 2 (two) times daily.    . diphenhydrAMINE (BENADRYL) 25 MG tablet Take 25 mg by mouth every 6 (six) hours as needed for itching or allergies.    Marland Kitchen docusate sodium (COLACE) 100 MG capsule Take 100 mg by mouth 2 (two) times daily. For constipation    . DULoxetine (CYMBALTA) 20 MG capsule Take 1 capsule by mouth every morning.    . fexofenadine (ALLEGRA) 180 MG tablet Take 180 mg by mouth every morning.    . fluticasone (FLONASE) 50 MCG/ACT nasal spray Place 1 spray into both nostrils daily as needed for allergies or  rhinitis.    . hydrALAZINE (APRESOLINE) 10 MG tablet Take 10 mg by mouth 2 (two) times daily.    Marland Kitchen HYDROcodone-acetaminophen (NORCO/VICODIN) 5-325 MG tablet Take 1 tablet by mouth every 4 (four) hours as needed.    . hydrocortisone (ANUSOL-HC) 2.5 % rectal cream Place 1 application rectally 2 (two) times daily as needed for hemorrhoids or anal itching.    . hydrocortisone cream 0.5 % Apply 1 application topically 2 (two) times daily as needed for itching (for rash).    . Hypertonic Nasal Wash (SINUS RINSE NA) Place into the nose daily as needed (for sinusitis).    Marland Kitchen levothyroxine (SYNTHROID, LEVOTHROID) 75 MCG tablet Take 75 mcg by mouth daily before breakfast.     . loperamide (IMODIUM) 2 MG capsule Take 2 mg by mouth every 4 (four) hours as needed for diarrhea or loose stools.    . magnesium hydroxide (MILK OF MAGNESIA) 400 MG/5ML suspension Take by mouth 2 (two) times daily as needed for mild constipation (0.66ms administered as needed).    . meclizine (ANTIVERT) 25 MG tablet Take 25 mg by mouth every 6 (six) hours as needed for dizziness.    . mineral oil liquid Place in ear(s) every Thursday. 2 drops in both ears once daily on Thursdays for Cerumen Impaction    .  multivitamin-lutein (OCUVITE-LUTEIN) CAPS capsule Take 1 capsule by mouth 2 (two) times daily.    . Olopatadine HCl (PATADAY) 0.2 % SOLN Place 1 drop into both eyes daily.    . ondansetron (ZOFRAN) 8 MG tablet Take 8 mg by mouth every 8 (eight) hours as needed for nausea or vomiting.    Marland Kitchen oxycodone (OXY-IR) 5 MG capsule Take 5 mg by mouth every 4 (four) hours as needed for pain.    . pantoprazole (PROTONIX) 40 MG tablet Take 40 mg by mouth daily.    . polyethylene glycol (MIRALAX / GLYCOLAX) packet Take 17 g by mouth daily.    . predniSONE (DELTASONE) 5 MG tablet Take 5 mg by mouth daily with breakfast.    . prochlorperazine (COMPAZINE) 10 MG tablet Take 10 mg by mouth every 6 (six) hours as needed for nausea or vomiting.    . senna  (SENOKOT) 8.6 MG tablet Take 1 tablet by mouth 2 (two) times daily.     No current facility-administered medications for this visit.      Past Medical History:  Diagnosis Date  . Anemia   . Bradycardia   . Chronic back pain   . Constipation   . Degenerative joint disease   . Diverticulosis   . External hemorrhoids   . GERD (gastroesophageal reflux disease)   . Goiter, unspecified   . Hyperlipidemia   . Hypertension   . Hypothyroidism   . Macular degeneration   . Multiple myeloma ([place]) 03/12/2016  . Osteoporosis   . Pacemaker   . Palpitations   . Polymyalgia rheumatica ([place])   . Seasonal allergies   . Thyrotoxicosis without mention of goiter or other cause, without mention of thyrotoxic crisis or storm     ROS:   All systems reviewed and negative except as noted in the HPI.   Past Surgical History:  Procedure Laterality Date  . COLONOSCOPY  November 2011   External hemorrhoids, diverticulosis, anal papillae  . COLONOSCOPY N/A 04/01/2014   Procedure: COLONOSCOPY;  Surgeon: Rogene Houston, MD;  [facility];  Service: Endoscopy;  Laterality: N/A;  930  . FEMUR IM NAIL Left 12/29/2012   Procedure: INTRAMEDULLARY (IM) NAIL INTERTROCHANTRIC;  Surgeon: Melina Schools, MD;  Location: [facility];  Service: Orthopedics;  Laterality: Left;  . INSERT / REPLACE / REMOVE PACEMAKER    . PERMANENT PACEMAKER INSERTION N/A 06/07/2011   Procedure: PERMANENT PACEMAKER INSERTION;  Surgeon: Evans Lance, MD;  Location: [facility] CATH LAB;  Service: Cardiovascular;  Laterality: N/A;  . THYROIDECTOMY    . TONSILLECTOMY    . vertebroplasty       Family History  Problem Relation Age of Onset  . Heart attack Mother        Deceased  . Cancer Father        Deceased, colon cancer age 42  . Cancer Brother        Deceased, throat and lung     Social History   Socioeconomic History  . Marital status: Widowed    Spouse name: Not on file  . Number of children: Not on file  . Years of  education: Not on file  . Highest education level: Not on file  Occupational History  . Not on file  Social Needs  . Financial resource strain: Not on file  . Food insecurity:    Worry: Not on file    Inability: Not on file  . Transportation needs:    Medical: Not on  file    Non-medical: Not on file  Tobacco Use  . Smoking status: Never Smoker  . Smokeless tobacco: Never Used  Substance and Sexual Activity  . Alcohol use: No  . Drug use: No  . Sexual activity: Never  Lifestyle  . Physical activity:    Days per week: Not on file    Minutes per session: Not on file  . Stress: Not on file  Relationships  . Social connections:    Talks on phone: Not on file    Gets together: Not on file    Attends religious service: Not on file    Active member of club or organization: Not on file    Attends meetings of clubs or organizations: Not on file    Relationship status: Not on file  . Intimate partner violence:    Fear of current or ex partner: Not on file    Emotionally abused: Not on file    Physically abused: Not on file    Forced sexual activity: Not on file  Other Topics Concern  . Not on file  Social History Narrative  . Not on file     BP (!) 146/60   Pulse 65   Ht _0  (1.6 m)   Wt 134 lb 9.6 oz (61.1 kg)   SpO2 91%   BMI 23.84 kg/m   Physical Exam:  Well appearing NAD HEENT: Unremarkable Neck:  No JVD, no thyromegally Lymphatics:  No adenopathy Back:  No CVA tenderness Lungs:  Clear with no wheezes HEART:  Regular rate rhythm, no murmurs, no rubs, no clicks Abd:  soft, positive bowel sounds, no organomegally, no rebound, no guarding Ext:  2 plus pulses, no edema, no cyanosis, no clubbing Skin:  No rashes no nodules Neuro:  CN II through XII intact, motor grossly intact  DEVICE  Normal device function.  See PaceArt for details.   1. Sinus node dysfunction - she is asymptomatic, s/p PPM insertion.  2. PPM - her [employer] Jude DDD PM is working normally. 3.  PAF - she has not had any symptomatic atrial fib. We will follow.  Caelan Branden,M.D. Assess/Plan:

## 2018-01-01 NOTE — Patient Instructions (Signed)
Medication Instructions:  Your physician recommends that you continue on your current medications as directed. Please refer to the Current Medication list given to you today.  Labwork: None ordered.  Testing/Procedures: None ordered.  Follow-Up: Your physician wants you to follow-up in: one year with Dr. Lovena Le.   You will receive a reminder letter in the mail two months in advance. If you don't receive a letter, please call our office to schedule the follow-up appointment.  Remote monitoring is used to monitor your Pacemaker from home. This monitoring reduces the number of office visits required to check your device to one time per year. It allows [place] to keep an eye on the functioning of your device to ensure it is working properly. You are scheduled for a device check from home on 03/10/2018. You may send your transmission at any time that day. If you have a wireless device, the transmission will be sent automatically. After your physician reviews your transmission, you will receive a postcard with your next transmission date.  Any Other Special Instructions Will Be Listed Below (If Applicable).  If you need a refill on your cardiac medications before your next appointment, please call your pharmacy.

## 2018-01-02 DIAGNOSIS — M79672 Pain in left foot: Secondary | ICD-10-CM | POA: Diagnosis not present

## 2018-01-02 DIAGNOSIS — M79671 Pain in right foot: Secondary | ICD-10-CM | POA: Diagnosis not present

## 2018-01-02 DIAGNOSIS — H353221 Exudative age-related macular degeneration, left eye, with active choroidal neovascularization: Secondary | ICD-10-CM | POA: Diagnosis not present

## 2018-01-02 DIAGNOSIS — I739 Peripheral vascular disease, unspecified: Secondary | ICD-10-CM | POA: Diagnosis not present

## 2018-01-02 DIAGNOSIS — L110 Acquired keratosis follicularis: Secondary | ICD-10-CM | POA: Diagnosis not present

## 2018-01-02 DIAGNOSIS — H35352 Cystoid macular degeneration, left eye: Secondary | ICD-10-CM | POA: Diagnosis not present

## 2018-01-02 DIAGNOSIS — H43812 Vitreous degeneration, left eye: Secondary | ICD-10-CM | POA: Diagnosis not present

## 2018-01-10 DIAGNOSIS — M25519 Pain in unspecified shoulder: Secondary | ICD-10-CM | POA: Diagnosis not present

## 2018-01-10 DIAGNOSIS — M545 Low back pain: Secondary | ICD-10-CM | POA: Diagnosis not present

## 2018-01-10 DIAGNOSIS — Z6824 Body mass index (BMI) 24.0-24.9, adult: Secondary | ICD-10-CM | POA: Diagnosis not present

## 2018-01-10 DIAGNOSIS — C9000 Multiple myeloma not having achieved remission: Secondary | ICD-10-CM | POA: Diagnosis not present

## 2018-01-10 DIAGNOSIS — J019 Acute sinusitis, unspecified: Secondary | ICD-10-CM | POA: Diagnosis not present

## 2018-01-12 DIAGNOSIS — Z23 Encounter for immunization: Secondary | ICD-10-CM | POA: Diagnosis not present

## 2018-01-13 LAB — CUP PACEART REMOTE DEVICE CHECK
Battery Remaining Longevity: 82 mo
Battery Remaining Percentage: 73 %
Battery Voltage: 2.92 V
Brady Statistic AP VP Percent: 1 %
Brady Statistic AP VS Percent: 53 %
Brady Statistic AS VP Percent: 1 %
Brady Statistic RA Percent Paced: 45 %
Brady Statistic RV Percent Paced: 1 %
Date Time Interrogation Session: 20190819061646
Implantable Lead Implant Date: 20130214
Implantable Lead Location: 753859
Implantable Pulse Generator Implant Date: 20130214
Lead Channel Impedance Value: 380 Ohm
Lead Channel Pacing Threshold Amplitude: 0.5 V
Lead Channel Pacing Threshold Pulse Width: 0.5 ms
Lead Channel Sensing Intrinsic Amplitude: 12 mV
Lead Channel Setting Pacing Pulse Width: 0.5 ms
Lead Channel Setting Sensing Sensitivity: 2 mV
MDC IDC LEAD IMPLANT DT: 20130214
MDC IDC LEAD LOCATION: 753860
MDC IDC MSMT LEADCHNL RA SENSING INTR AMPL: 3.3 mV
MDC IDC MSMT LEADCHNL RV IMPEDANCE VALUE: 410 Ohm
MDC IDC MSMT LEADCHNL RV PACING THRESHOLD AMPLITUDE: 1.25 V
MDC IDC MSMT LEADCHNL RV PACING THRESHOLD PULSEWIDTH: 0.5 ms
MDC IDC PG SERIAL: 7316836
MDC IDC SET LEADCHNL RA PACING AMPLITUDE: 2 V
MDC IDC SET LEADCHNL RV PACING AMPLITUDE: 2.5 V
MDC IDC STAT BRADY AS VS PERCENT: 47 %
Pulse Gen Model: 2210

## 2018-01-20 LAB — CUP PACEART INCLINIC DEVICE CHECK
Battery Remaining Longevity: 91 mo
Battery Voltage: 2.92 V
Brady Statistic RA Percent Paced: 47 %
Implantable Lead Implant Date: 20130214
Implantable Lead Location: 753860
Lead Channel Impedance Value: 375 Ohm
Lead Channel Impedance Value: 412.5 Ohm
Lead Channel Pacing Threshold Pulse Width: 0.5 ms
Lead Channel Sensing Intrinsic Amplitude: 3.1 mV
Lead Channel Setting Pacing Amplitude: 2.5 V
MDC IDC LEAD IMPLANT DT: 20130214
MDC IDC LEAD LOCATION: 753859
MDC IDC MSMT LEADCHNL RA PACING THRESHOLD AMPLITUDE: 0.75 V
MDC IDC MSMT LEADCHNL RV PACING THRESHOLD AMPLITUDE: 1.25 V
MDC IDC MSMT LEADCHNL RV PACING THRESHOLD PULSEWIDTH: 0.5 ms
MDC IDC MSMT LEADCHNL RV SENSING INTR AMPL: 12 mV
MDC IDC PG IMPLANT DT: 20130214
MDC IDC SESS DTM: 20190911162802
MDC IDC SET LEADCHNL RA PACING AMPLITUDE: 2 V
MDC IDC SET LEADCHNL RV PACING PULSEWIDTH: 0.5 ms
MDC IDC SET LEADCHNL RV SENSING SENSITIVITY: 2 mV
MDC IDC STAT BRADY RV PERCENT PACED: 0.1 %
Pulse Gen Serial Number: 7316836

## 2018-01-27 DIAGNOSIS — C9000 Multiple myeloma not having achieved remission: Secondary | ICD-10-CM | POA: Diagnosis not present

## 2018-01-27 DIAGNOSIS — E871 Hypo-osmolality and hyponatremia: Secondary | ICD-10-CM | POA: Diagnosis not present

## 2018-01-27 DIAGNOSIS — D649 Anemia, unspecified: Secondary | ICD-10-CM | POA: Diagnosis not present

## 2018-01-27 DIAGNOSIS — R627 Adult failure to thrive: Secondary | ICD-10-CM | POA: Diagnosis not present

## 2018-02-24 DIAGNOSIS — C9000 Multiple myeloma not having achieved remission: Secondary | ICD-10-CM | POA: Diagnosis not present

## 2018-02-26 DIAGNOSIS — Z0000 Encounter for general adult medical examination without abnormal findings: Secondary | ICD-10-CM | POA: Diagnosis not present

## 2018-02-26 DIAGNOSIS — Z23 Encounter for immunization: Secondary | ICD-10-CM | POA: Diagnosis not present

## 2018-03-05 DIAGNOSIS — L110 Acquired keratosis follicularis: Secondary | ICD-10-CM | POA: Diagnosis not present

## 2018-03-05 DIAGNOSIS — M79672 Pain in left foot: Secondary | ICD-10-CM | POA: Diagnosis not present

## 2018-03-05 DIAGNOSIS — I739 Peripheral vascular disease, unspecified: Secondary | ICD-10-CM | POA: Diagnosis not present

## 2018-03-05 DIAGNOSIS — M79671 Pain in right foot: Secondary | ICD-10-CM | POA: Diagnosis not present

## 2018-03-06 DIAGNOSIS — H35352 Cystoid macular degeneration, left eye: Secondary | ICD-10-CM | POA: Diagnosis not present

## 2018-03-06 DIAGNOSIS — H353221 Exudative age-related macular degeneration, left eye, with active choroidal neovascularization: Secondary | ICD-10-CM | POA: Diagnosis not present

## 2018-03-06 DIAGNOSIS — H35372 Puckering of macula, left eye: Secondary | ICD-10-CM | POA: Diagnosis not present

## 2018-03-06 DIAGNOSIS — H43812 Vitreous degeneration, left eye: Secondary | ICD-10-CM | POA: Diagnosis not present

## 2018-03-10 ENCOUNTER — Ambulatory Visit (INDEPENDENT_AMBULATORY_CARE_PROVIDER_SITE_OTHER): Payer: Medicare Other

## 2018-03-10 DIAGNOSIS — I495 Sick sinus syndrome: Secondary | ICD-10-CM | POA: Diagnosis not present

## 2018-03-10 NOTE — Progress Notes (Signed)
Remote pacemaker transmission.   

## 2018-03-13 ENCOUNTER — Encounter: Payer: Self-pay | Admitting: Cardiology

## 2018-03-27 DIAGNOSIS — C9000 Multiple myeloma not having achieved remission: Secondary | ICD-10-CM | POA: Diagnosis not present

## 2018-03-27 DIAGNOSIS — D649 Anemia, unspecified: Secondary | ICD-10-CM | POA: Diagnosis not present

## 2018-03-27 DIAGNOSIS — Z888 Allergy status to other drugs, medicaments and biological substances status: Secondary | ICD-10-CM | POA: Diagnosis not present

## 2018-03-27 DIAGNOSIS — Z79899 Other long term (current) drug therapy: Secondary | ICD-10-CM | POA: Diagnosis not present

## 2018-03-27 DIAGNOSIS — E871 Hypo-osmolality and hyponatremia: Secondary | ICD-10-CM | POA: Diagnosis not present

## 2018-03-27 DIAGNOSIS — M353 Polymyalgia rheumatica: Secondary | ICD-10-CM | POA: Diagnosis not present

## 2018-03-27 DIAGNOSIS — Z7982 Long term (current) use of aspirin: Secondary | ICD-10-CM | POA: Diagnosis not present

## 2018-03-27 DIAGNOSIS — Z950 Presence of cardiac pacemaker: Secondary | ICD-10-CM | POA: Diagnosis not present

## 2018-03-27 DIAGNOSIS — R627 Adult failure to thrive: Secondary | ICD-10-CM | POA: Diagnosis not present

## 2018-03-27 DIAGNOSIS — I10 Essential (primary) hypertension: Secondary | ICD-10-CM | POA: Diagnosis not present

## 2018-03-27 DIAGNOSIS — Z885 Allergy status to narcotic agent status: Secondary | ICD-10-CM | POA: Diagnosis not present

## 2018-03-27 DIAGNOSIS — E039 Hypothyroidism, unspecified: Secondary | ICD-10-CM | POA: Diagnosis not present

## 2018-03-27 DIAGNOSIS — G8929 Other chronic pain: Secondary | ICD-10-CM | POA: Diagnosis not present

## 2018-04-07 DIAGNOSIS — H6122 Impacted cerumen, left ear: Secondary | ICD-10-CM | POA: Diagnosis not present

## 2018-04-07 DIAGNOSIS — H6692 Otitis media, unspecified, left ear: Secondary | ICD-10-CM | POA: Diagnosis not present

## 2018-04-07 DIAGNOSIS — R0982 Postnasal drip: Secondary | ICD-10-CM | POA: Diagnosis not present

## 2018-04-28 DIAGNOSIS — H35352 Cystoid macular degeneration, left eye: Secondary | ICD-10-CM | POA: Diagnosis not present

## 2018-04-28 DIAGNOSIS — H353221 Exudative age-related macular degeneration, left eye, with active choroidal neovascularization: Secondary | ICD-10-CM | POA: Diagnosis not present

## 2018-04-28 DIAGNOSIS — H35372 Puckering of macula, left eye: Secondary | ICD-10-CM | POA: Diagnosis not present

## 2018-04-28 DIAGNOSIS — H1010 Acute atopic conjunctivitis, unspecified eye: Secondary | ICD-10-CM | POA: Diagnosis not present

## 2018-05-01 DIAGNOSIS — C9000 Multiple myeloma not having achieved remission: Secondary | ICD-10-CM | POA: Diagnosis not present

## 2018-05-01 DIAGNOSIS — Z79899 Other long term (current) drug therapy: Secondary | ICD-10-CM | POA: Diagnosis not present

## 2018-05-06 LAB — CUP PACEART REMOTE DEVICE CHECK
Battery Remaining Longevity: 73 mo
Battery Remaining Percentage: 65 %
Battery Voltage: 2.9 V
Brady Statistic AP VS Percent: 56 %
Brady Statistic RA Percent Paced: 45 %
Brady Statistic RV Percent Paced: 1 %
Implantable Lead Implant Date: 20130214
Implantable Lead Implant Date: 20130214
Implantable Lead Location: 753859
Implantable Pulse Generator Implant Date: 20130214
Lead Channel Impedance Value: 380 Ohm
Lead Channel Impedance Value: 400 Ohm
Lead Channel Pacing Threshold Amplitude: 0.75 V
Lead Channel Pacing Threshold Amplitude: 1.25 V
Lead Channel Pacing Threshold Pulse Width: 0.5 ms
Lead Channel Sensing Intrinsic Amplitude: 3.6 mV
Lead Channel Setting Pacing Pulse Width: 0.5 ms
Lead Channel Setting Sensing Sensitivity: 2 mV
MDC IDC LEAD LOCATION: 753860
MDC IDC MSMT LEADCHNL RV PACING THRESHOLD PULSEWIDTH: 0.5 ms
MDC IDC MSMT LEADCHNL RV SENSING INTR AMPL: 12 mV
MDC IDC PG SERIAL: 7316836
MDC IDC SESS DTM: 20191118084707
MDC IDC SET LEADCHNL RA PACING AMPLITUDE: 2 V
MDC IDC SET LEADCHNL RV PACING AMPLITUDE: 2.5 V
MDC IDC STAT BRADY AP VP PERCENT: 1 %
MDC IDC STAT BRADY AS VP PERCENT: 1 %
MDC IDC STAT BRADY AS VS PERCENT: 44 %

## 2018-05-16 DIAGNOSIS — M79671 Pain in right foot: Secondary | ICD-10-CM | POA: Diagnosis not present

## 2018-05-16 DIAGNOSIS — M79672 Pain in left foot: Secondary | ICD-10-CM | POA: Diagnosis not present

## 2018-05-16 DIAGNOSIS — I739 Peripheral vascular disease, unspecified: Secondary | ICD-10-CM | POA: Diagnosis not present

## 2018-05-16 DIAGNOSIS — L110 Acquired keratosis follicularis: Secondary | ICD-10-CM | POA: Diagnosis not present

## 2018-05-16 DIAGNOSIS — M79675 Pain in left toe(s): Secondary | ICD-10-CM | POA: Diagnosis not present

## 2018-05-19 DIAGNOSIS — C9000 Multiple myeloma not having achieved remission: Secondary | ICD-10-CM | POA: Diagnosis not present

## 2018-06-02 DIAGNOSIS — Z79899 Other long term (current) drug therapy: Secondary | ICD-10-CM | POA: Diagnosis not present

## 2018-06-02 DIAGNOSIS — C9000 Multiple myeloma not having achieved remission: Secondary | ICD-10-CM | POA: Diagnosis not present

## 2018-06-04 DIAGNOSIS — L821 Other seborrheic keratosis: Secondary | ICD-10-CM | POA: Diagnosis not present

## 2018-06-04 DIAGNOSIS — L728 Other follicular cysts of the skin and subcutaneous tissue: Secondary | ICD-10-CM | POA: Diagnosis not present

## 2018-06-04 DIAGNOSIS — L570 Actinic keratosis: Secondary | ICD-10-CM | POA: Diagnosis not present

## 2018-06-04 DIAGNOSIS — L819 Disorder of pigmentation, unspecified: Secondary | ICD-10-CM | POA: Diagnosis not present

## 2018-06-09 ENCOUNTER — Ambulatory Visit (INDEPENDENT_AMBULATORY_CARE_PROVIDER_SITE_OTHER): Payer: Medicare Other

## 2018-06-09 DIAGNOSIS — I495 Sick sinus syndrome: Secondary | ICD-10-CM | POA: Diagnosis not present

## 2018-06-10 LAB — CUP PACEART REMOTE DEVICE CHECK
Battery Remaining Percentage: 65 %
Battery Voltage: 2.9 V
Brady Statistic AP VP Percent: 1 %
Brady Statistic AS VP Percent: 1 %
Brady Statistic AS VS Percent: 47 %
Brady Statistic RA Percent Paced: 43 %
Date Time Interrogation Session: 20200217072015
Implantable Lead Implant Date: 20130214
Implantable Lead Location: 753859
Implantable Lead Location: 753860
Implantable Pulse Generator Implant Date: 20130214
Lead Channel Impedance Value: 400 Ohm
Lead Channel Pacing Threshold Amplitude: 0.75 V
Lead Channel Pacing Threshold Pulse Width: 0.5 ms
Lead Channel Pacing Threshold Pulse Width: 0.5 ms
Lead Channel Sensing Intrinsic Amplitude: 3.8 mV
Lead Channel Setting Pacing Amplitude: 2.5 V
MDC IDC LEAD IMPLANT DT: 20130214
MDC IDC MSMT BATTERY REMAINING LONGEVITY: 73 mo
MDC IDC MSMT LEADCHNL RA IMPEDANCE VALUE: 390 Ohm
MDC IDC MSMT LEADCHNL RV PACING THRESHOLD AMPLITUDE: 1.25 V
MDC IDC MSMT LEADCHNL RV SENSING INTR AMPL: 12 mV
MDC IDC PG SERIAL: 7316836
MDC IDC SET LEADCHNL RA PACING AMPLITUDE: 2 V
MDC IDC SET LEADCHNL RV PACING PULSEWIDTH: 0.5 ms
MDC IDC SET LEADCHNL RV SENSING SENSITIVITY: 2 mV
MDC IDC STAT BRADY AP VS PERCENT: 53 %
MDC IDC STAT BRADY RV PERCENT PACED: 1 %
Pulse Gen Model: 2210

## 2018-06-18 NOTE — Progress Notes (Signed)
Remote pacemaker transmission.   

## 2018-06-27 DIAGNOSIS — C9000 Multiple myeloma not having achieved remission: Secondary | ICD-10-CM | POA: Diagnosis not present

## 2018-06-30 DIAGNOSIS — C9000 Multiple myeloma not having achieved remission: Secondary | ICD-10-CM | POA: Diagnosis not present

## 2018-06-30 DIAGNOSIS — E871 Hypo-osmolality and hyponatremia: Secondary | ICD-10-CM | POA: Diagnosis not present

## 2018-06-30 DIAGNOSIS — Z79899 Other long term (current) drug therapy: Secondary | ICD-10-CM | POA: Diagnosis not present

## 2018-06-30 DIAGNOSIS — R350 Frequency of micturition: Secondary | ICD-10-CM | POA: Diagnosis not present

## 2018-06-30 DIAGNOSIS — D649 Anemia, unspecified: Secondary | ICD-10-CM | POA: Diagnosis not present

## 2018-06-30 DIAGNOSIS — R5383 Other fatigue: Secondary | ICD-10-CM | POA: Diagnosis not present

## 2018-06-30 DIAGNOSIS — Z6826 Body mass index (BMI) 26.0-26.9, adult: Secondary | ICD-10-CM | POA: Diagnosis not present

## 2018-06-30 DIAGNOSIS — R627 Adult failure to thrive: Secondary | ICD-10-CM | POA: Diagnosis not present

## 2018-06-30 DIAGNOSIS — R7989 Other specified abnormal findings of blood chemistry: Secondary | ICD-10-CM | POA: Diagnosis not present

## 2018-06-30 DIAGNOSIS — Z79891 Long term (current) use of opiate analgesic: Secondary | ICD-10-CM | POA: Diagnosis not present

## 2018-07-01 DIAGNOSIS — H43812 Vitreous degeneration, left eye: Secondary | ICD-10-CM | POA: Diagnosis not present

## 2018-07-01 DIAGNOSIS — H35372 Puckering of macula, left eye: Secondary | ICD-10-CM | POA: Diagnosis not present

## 2018-07-01 DIAGNOSIS — H35052 Retinal neovascularization, unspecified, left eye: Secondary | ICD-10-CM | POA: Diagnosis not present

## 2018-07-01 DIAGNOSIS — H353221 Exudative age-related macular degeneration, left eye, with active choroidal neovascularization: Secondary | ICD-10-CM | POA: Diagnosis not present

## 2018-07-01 DIAGNOSIS — H35352 Cystoid macular degeneration, left eye: Secondary | ICD-10-CM | POA: Diagnosis not present

## 2018-07-03 ENCOUNTER — Ambulatory Visit (INDEPENDENT_AMBULATORY_CARE_PROVIDER_SITE_OTHER): Payer: Medicare Other | Admitting: Otolaryngology

## 2018-07-03 DIAGNOSIS — H903 Sensorineural hearing loss, bilateral: Secondary | ICD-10-CM | POA: Diagnosis not present

## 2018-07-03 DIAGNOSIS — H6123 Impacted cerumen, bilateral: Secondary | ICD-10-CM | POA: Diagnosis not present

## 2018-07-31 DIAGNOSIS — M79675 Pain in left toe(s): Secondary | ICD-10-CM | POA: Diagnosis not present

## 2018-07-31 DIAGNOSIS — B351 Tinea unguium: Secondary | ICD-10-CM | POA: Diagnosis not present

## 2018-07-31 DIAGNOSIS — M79674 Pain in right toe(s): Secondary | ICD-10-CM | POA: Diagnosis not present

## 2018-08-18 DIAGNOSIS — L02211 Cutaneous abscess of abdominal wall: Secondary | ICD-10-CM | POA: Diagnosis not present

## 2018-08-18 DIAGNOSIS — L03311 Cellulitis of abdominal wall: Secondary | ICD-10-CM | POA: Diagnosis not present

## 2018-08-25 DIAGNOSIS — L03311 Cellulitis of abdominal wall: Secondary | ICD-10-CM | POA: Diagnosis not present

## 2018-09-08 ENCOUNTER — Other Ambulatory Visit: Payer: Self-pay

## 2018-09-08 ENCOUNTER — Ambulatory Visit (INDEPENDENT_AMBULATORY_CARE_PROVIDER_SITE_OTHER): Payer: Medicare Other | Admitting: *Deleted

## 2018-09-08 DIAGNOSIS — I495 Sick sinus syndrome: Secondary | ICD-10-CM

## 2018-09-09 LAB — CUP PACEART REMOTE DEVICE CHECK
Date Time Interrogation Session: 20200519103530
Implantable Lead Implant Date: 20130214
Implantable Lead Implant Date: 20130214
Implantable Lead Location: 753859
Implantable Lead Location: 753860
Implantable Pulse Generator Implant Date: 20130214
Pulse Gen Model: 2210
Pulse Gen Serial Number: 7316836

## 2018-09-17 DIAGNOSIS — Z03818 Encounter for observation for suspected exposure to other biological agents ruled out: Secondary | ICD-10-CM | POA: Diagnosis not present

## 2018-09-18 ENCOUNTER — Encounter: Payer: Self-pay | Admitting: Cardiology

## 2018-09-18 NOTE — Progress Notes (Signed)
Remote pacemaker transmission.   

## 2018-09-18 NOTE — Progress Notes (Signed)
Letter  

## 2018-10-13 DIAGNOSIS — C9000 Multiple myeloma not having achieved remission: Secondary | ICD-10-CM | POA: Diagnosis not present

## 2018-10-14 ENCOUNTER — Other Ambulatory Visit (HOSPITAL_COMMUNITY)
Admission: RE | Admit: 2018-10-14 | Discharge: 2018-10-14 | Disposition: A | Discharge status: Home / Self Care | Payer: Medicare Other | Source: Ambulatory Visit | Attending: Internal Medicine | Admitting: Internal Medicine

## 2018-10-14 DIAGNOSIS — C9000 Multiple myeloma not having achieved remission: Secondary | ICD-10-CM | POA: Insufficient documentation

## 2018-10-14 LAB — COMPREHENSIVE METABOLIC PANEL
ALT: 10 U/L (ref 0–44)
AST: 16 U/L (ref 15–41)
Albumin: 3.8 g/dL (ref 3.5–5.0)
Alkaline Phosphatase: 31 U/L — ABNORMAL LOW (ref 38–126)
Anion gap: 8 (ref 5–15)
BUN: 19 mg/dL (ref 8–23)
CO2: 27 mmol/L (ref 22–32)
Calcium: 8.5 mg/dL — ABNORMAL LOW (ref 8.9–10.3)
Chloride: 100 mmol/L (ref 98–111)
Creatinine, Ser: 1.05 mg/dL — ABNORMAL HIGH (ref 0.44–1.00)
GFR calc Af Amer: 53 mL/min — ABNORMAL LOW (ref >60)
GFR calc non Af Amer: 46 mL/min — ABNORMAL LOW (ref >60)
Glucose, Bld: 130 mg/dL — ABNORMAL HIGH (ref 70–99)
Potassium: 3.7 mmol/L (ref 3.5–5.1)
Sodium: 135 mmol/L (ref 135–145)
Total Bilirubin: 0.4 mg/dL (ref 0.3–1.2)
Total Protein: 5.8 g/dL — ABNORMAL LOW (ref 6.5–8.1)

## 2018-10-14 LAB — CBC WITH DIFFERENTIAL/PLATELET
Abs Immature Granulocytes: 0.02 10*3/uL (ref 0.00–0.07)
Basophils Absolute: 0 10*3/uL (ref 0.0–0.1)
Basophils Relative: 1 %
Eosinophils Absolute: 0.3 10*3/uL (ref 0.0–0.5)
Eosinophils Relative: 6 %
HCT: 32.4 % — ABNORMAL LOW (ref 36.0–46.0)
Hemoglobin: 10.5 g/dL — ABNORMAL LOW (ref 12.0–15.0)
Immature Granulocytes: 0 %
Lymphocytes Relative: 34 %
Lymphs Abs: 1.7 10*3/uL (ref 0.7–4.0)
MCH: 34.9 pg — ABNORMAL HIGH (ref 26.0–34.0)
MCHC: 32.4 g/dL (ref 30.0–36.0)
MCV: 107.6 fL — ABNORMAL HIGH (ref 80.0–100.0)
Monocytes Absolute: 0.6 10*3/uL (ref 0.1–1.0)
Monocytes Relative: 11 %
Neutro Abs: 2.4 10*3/uL (ref 1.7–7.7)
Neutrophils Relative %: 48 %
Platelets: 324 10*3/uL (ref 150–400)
RBC: 3.01 MIL/uL — ABNORMAL LOW (ref 3.87–5.11)
RDW: 16.2 % — ABNORMAL HIGH (ref 11.5–15.5)
WBC: 5 10*3/uL (ref 4.0–10.5)
nRBC: 0 % (ref 0.0–0.2)

## 2018-10-16 DIAGNOSIS — B351 Tinea unguium: Secondary | ICD-10-CM | POA: Diagnosis not present

## 2018-10-16 DIAGNOSIS — M79675 Pain in left toe(s): Secondary | ICD-10-CM | POA: Diagnosis not present

## 2018-10-16 DIAGNOSIS — M79674 Pain in right toe(s): Secondary | ICD-10-CM | POA: Diagnosis not present

## 2018-11-10 DIAGNOSIS — R530 Neoplastic (malignant) related fatigue: Secondary | ICD-10-CM | POA: Diagnosis not present

## 2018-11-10 DIAGNOSIS — C9000 Multiple myeloma not having achieved remission: Secondary | ICD-10-CM | POA: Diagnosis not present

## 2018-11-10 DIAGNOSIS — R627 Adult failure to thrive: Secondary | ICD-10-CM | POA: Diagnosis not present

## 2018-11-10 DIAGNOSIS — R52 Pain, unspecified: Secondary | ICD-10-CM | POA: Diagnosis not present

## 2018-11-21 DIAGNOSIS — D630 Anemia in neoplastic disease: Secondary | ICD-10-CM | POA: Diagnosis not present

## 2018-11-21 DIAGNOSIS — R5383 Other fatigue: Secondary | ICD-10-CM | POA: Diagnosis not present

## 2018-11-21 DIAGNOSIS — C9000 Multiple myeloma not having achieved remission: Secondary | ICD-10-CM | POA: Diagnosis not present

## 2018-11-21 DIAGNOSIS — E871 Hypo-osmolality and hyponatremia: Secondary | ICD-10-CM | POA: Diagnosis not present

## 2018-12-08 ENCOUNTER — Ambulatory Visit (INDEPENDENT_AMBULATORY_CARE_PROVIDER_SITE_OTHER): Payer: Medicare Other | Admitting: *Deleted

## 2018-12-08 DIAGNOSIS — R002 Palpitations: Secondary | ICD-10-CM

## 2018-12-08 LAB — CUP PACEART REMOTE DEVICE CHECK
Battery Remaining Longevity: 58 mo
Battery Remaining Percentage: 51 %
Battery Voltage: 2.87 V
Brady Statistic AP VP Percent: 1 %
Brady Statistic AP VS Percent: 53 %
Brady Statistic AS VP Percent: 1 %
Brady Statistic AS VS Percent: 47 %
Brady Statistic RA Percent Paced: 44 %
Brady Statistic RV Percent Paced: 1 %
Date Time Interrogation Session: 20200817060030
Implantable Lead Implant Date: 20130214
Implantable Lead Implant Date: 20130214
Implantable Lead Location: 753859
Implantable Lead Location: 753860
Implantable Pulse Generator Implant Date: 20130214
Lead Channel Impedance Value: 380 Ohm
Lead Channel Impedance Value: 400 Ohm
Lead Channel Pacing Threshold Amplitude: 0.75 V
Lead Channel Pacing Threshold Amplitude: 1.25 V
Lead Channel Pacing Threshold Pulse Width: 0.5 ms
Lead Channel Pacing Threshold Pulse Width: 0.5 ms
Lead Channel Sensing Intrinsic Amplitude: 12 mV
Lead Channel Sensing Intrinsic Amplitude: 3.4 mV
Lead Channel Setting Pacing Amplitude: 2 V
Lead Channel Setting Pacing Amplitude: 2.5 V
Lead Channel Setting Pacing Pulse Width: 0.5 ms
Lead Channel Setting Sensing Sensitivity: 2 mV
Pulse Gen Model: 2210
Pulse Gen Serial Number: 7316836

## 2018-12-09 DIAGNOSIS — H353221 Exudative age-related macular degeneration, left eye, with active choroidal neovascularization: Secondary | ICD-10-CM | POA: Diagnosis not present

## 2018-12-09 DIAGNOSIS — H353134 Nonexudative age-related macular degeneration, bilateral, advanced atrophic with subfoveal involvement: Secondary | ICD-10-CM | POA: Diagnosis not present

## 2018-12-09 DIAGNOSIS — H35372 Puckering of macula, left eye: Secondary | ICD-10-CM | POA: Diagnosis not present

## 2018-12-09 DIAGNOSIS — H35351 Cystoid macular degeneration, right eye: Secondary | ICD-10-CM | POA: Diagnosis not present

## 2018-12-10 DIAGNOSIS — R8290 Unspecified abnormal findings in urine: Secondary | ICD-10-CM | POA: Diagnosis not present

## 2018-12-10 DIAGNOSIS — R350 Frequency of micturition: Secondary | ICD-10-CM | POA: Diagnosis not present

## 2018-12-10 DIAGNOSIS — R82998 Other abnormal findings in urine: Secondary | ICD-10-CM | POA: Diagnosis not present

## 2018-12-16 NOTE — Progress Notes (Signed)
Remote pacemaker transmission.   

## 2018-12-18 ENCOUNTER — Other Ambulatory Visit (HOSPITAL_COMMUNITY)
Admission: RE | Admit: 2018-12-18 | Discharge: 2018-12-18 | Disposition: A | Discharge status: Home / Self Care | Payer: Medicare Other | Source: Ambulatory Visit | Attending: Internal Medicine | Admitting: Internal Medicine

## 2018-12-18 DIAGNOSIS — C9000 Multiple myeloma not having achieved remission: Secondary | ICD-10-CM | POA: Diagnosis not present

## 2018-12-18 LAB — CBC WITH DIFFERENTIAL/PLATELET
Abs Immature Granulocytes: 0.03 10*3/uL (ref 0.00–0.07)
Basophils Absolute: 0 10*3/uL (ref 0.0–0.1)
Basophils Relative: 1 %
Eosinophils Absolute: 0 10*3/uL (ref 0.0–0.5)
Eosinophils Relative: 0 %
HCT: 29.1 % — ABNORMAL LOW (ref 36.0–46.0)
Hemoglobin: 9.4 g/dL — ABNORMAL LOW (ref 12.0–15.0)
Immature Granulocytes: 0 %
Lymphocytes Relative: 14 %
Lymphs Abs: 1 10*3/uL (ref 0.7–4.0)
MCH: 35.2 pg — ABNORMAL HIGH (ref 26.0–34.0)
MCHC: 32.3 g/dL (ref 30.0–36.0)
MCV: 109 fL — ABNORMAL HIGH (ref 80.0–100.0)
Monocytes Absolute: 0.5 10*3/uL (ref 0.1–1.0)
Monocytes Relative: 6 %
Neutro Abs: 5.8 10*3/uL (ref 1.7–7.7)
Neutrophils Relative %: 79 %
Platelets: 300 10*3/uL (ref 150–400)
RBC: 2.67 MIL/uL — ABNORMAL LOW (ref 3.87–5.11)
RDW: 15.8 % — ABNORMAL HIGH (ref 11.5–15.5)
WBC: 7.3 10*3/uL (ref 4.0–10.5)
nRBC: 0 % (ref 0.0–0.2)

## 2018-12-19 LAB — PROTEIN ELECTROPHORESIS, SERUM
A/G Ratio: 1.6 (ref 0.7–1.7)
Albumin ELP: 3.5 g/dL (ref 2.9–4.4)
Alpha-1-Globulin: 0.2 g/dL (ref 0.0–0.4)
Alpha-2-Globulin: 0.5 g/dL (ref 0.4–1.0)
Beta Globulin: 1.3 g/dL (ref 0.7–1.3)
Gamma Globulin: 0.2 g/dL — ABNORMAL LOW (ref 0.4–1.8)
Globulin, Total: 2.2 g/dL (ref 2.2–3.9)
M-Spike, %: 0.3 g/dL — ABNORMAL HIGH
Total Protein ELP: 5.7 g/dL — ABNORMAL LOW (ref 6.0–8.5)

## 2018-12-24 DIAGNOSIS — B351 Tinea unguium: Secondary | ICD-10-CM | POA: Diagnosis not present

## 2018-12-24 DIAGNOSIS — M79674 Pain in right toe(s): Secondary | ICD-10-CM | POA: Diagnosis not present

## 2018-12-24 DIAGNOSIS — M79675 Pain in left toe(s): Secondary | ICD-10-CM | POA: Diagnosis not present

## 2019-01-07 ENCOUNTER — Other Ambulatory Visit: Payer: Self-pay

## 2019-01-07 ENCOUNTER — Telehealth (INDEPENDENT_AMBULATORY_CARE_PROVIDER_SITE_OTHER): Payer: Medicare Other | Admitting: Internal Medicine

## 2019-01-07 DIAGNOSIS — Z950 Presence of cardiac pacemaker: Secondary | ICD-10-CM | POA: Diagnosis not present

## 2019-01-07 DIAGNOSIS — I480 Paroxysmal atrial fibrillation: Secondary | ICD-10-CM | POA: Diagnosis not present

## 2019-01-07 NOTE — Progress Notes (Signed)
Electrophysiology TeleHealth Note   Due to national recommendations of social distancing due to COVID 19, an audio/video telehealth visit is felt to be most appropriate for this patient at this time.  See MyChart message from today for the patient's consent to telehealth for [facility]   Date:  01/07/2019   ID:  [name], DOB Mar 31, 1923, MRN [id number]  Location: patient's home  Provider location: [address] [place]  Evaluation Performed: Follow-up visit  PCP:  Celene Squibb, MD  Cardiologist:  No primary care provider on file.  Electrophysiologist:  Dr Lovena Le  Chief Complaint:  "I am doing alright."  History of Present Illness:    [name] is a 85 y.o. female who presents via audio/video conferencing for a telehealth visit today. She has a h/o symptomatic bradycardia, and has been stable in the interim.  [name] returns today for followup. She is a very pleasant 85 year old woman with a history of hypertension, palpitations, symptomatic bradycardia, status post permanent pacemaker insertion. She has been stable in the past year exceptfor back pain. She has had 3 vertebralplasties. Minimal edema. No syncope. No chest pain or sob. She remains sedentary and uses a walker.    Since last being seen in our clinic, the patient reports doing very well.  Today, she denies symptoms of palpitations, chest pain, shortness of breath,  lower extremity edema, dizziness, presyncope, or syncope.  The patient is otherwise without complaint today.  The patient denies symptoms of fevers, chills, cough, or new SOB worrisome for COVID 19.  Past Medical History:  Diagnosis Date  . Anemia   . Bradycardia   . Chronic back pain   . Constipation   . Degenerative joint disease   . Diverticulosis   . External hemorrhoids   . GERD (gastroesophageal reflux disease)   . Goiter, unspecified   . Hyperlipidemia   . Hypertension   . Hypothyroidism   . Macular degeneration    . Multiple myeloma ([place]) 03/12/2016  . Osteoporosis   . Pacemaker   . Palpitations   . Polymyalgia rheumatica ([place])   . Seasonal allergies   . Thyrotoxicosis without mention of goiter or other cause, without mention of thyrotoxic crisis or storm     Past Surgical History:  Procedure Laterality Date  . COLONOSCOPY  November 2011   External hemorrhoids, diverticulosis, anal papillae  . COLONOSCOPY N/A 04/01/2014   Procedure: COLONOSCOPY;  Surgeon: Rogene Houston, MD;  [facility];  Service: Endoscopy;  Laterality: N/A;  930  . FEMUR IM NAIL Left 12/29/2012   Procedure: INTRAMEDULLARY (IM) NAIL INTERTROCHANTRIC;  Surgeon: Melina Schools, MD;  Location: [place];  Service: Orthopedics;  Laterality: Left;  . INSERT / REPLACE / REMOVE PACEMAKER    . PERMANENT PACEMAKER INSERTION N/A 06/07/2011   Procedure: PERMANENT PACEMAKER INSERTION;  Surgeon: Evans Lance, MD;  Location: [facility] CATH LAB;  Service: Cardiovascular;  Laterality: N/A;  . THYROIDECTOMY    . TONSILLECTOMY    . vertebroplasty      Current Outpatient Medications  Medication Sig Dispense Refill  . acetaminophen (TYLENOL) 500 MG tablet Take 1,000 mg by mouth every 6 (six) hours as needed for mild pain.     Marland Kitchen acyclovir (ZOVIRAX) 400 MG tablet Take 400 mg by mouth 2 (two) times daily.    Marland Kitchen ALPRAZolam (XANAX) 0.25 MG tablet Take 0.25 mg by mouth at bedtime.     Marland Kitchen amLODipine (NORVASC) 5 MG tablet  Take 5 mg by mouth at bedtime.     Marland Kitchen aspirin 81 MG tablet Take 81 mg by mouth every evening.     . Calcium Carbonate-Vitamin D (CALCIUM 600+D) 600-400 MG-UNIT tablet Take 1 tablet by mouth 2 (two) times daily.    . carvedilol (COREG) 6.25 MG tablet Take 1 tablet (6.25 mg total) by mouth 2 (two) times daily with a meal. 60 tablet 0  . cetaphil (CETAPHIL) cream Apply 1 application topically 2 (two) times daily. DAY AND EVENING SHIFT FOR DRY SKIN    . cycloSPORINE (RESTASIS) 0.05 % ophthalmic emulsion Place 1 drop into both eyes 2  (two) times daily.    . diphenhydrAMINE (BENADRYL) 25 MG tablet Take 25 mg by mouth every 6 (six) hours as needed for itching or allergies.    Marland Kitchen docusate sodium (COLACE) 100 MG capsule Take 100 mg by mouth 2 (two) times daily. For constipation    . DULoxetine (CYMBALTA) 20 MG capsule Take 1 capsule by mouth every morning.    . fexofenadine (ALLEGRA) 180 MG tablet Take 180 mg by mouth every morning.    . fluticasone (FLONASE) 50 MCG/ACT nasal spray Place 1 spray into both nostrils daily as needed for allergies or rhinitis.    . hydrALAZINE (APRESOLINE) 10 MG tablet Take 10 mg by mouth 2 (two) times daily.    Marland Kitchen HYDROcodone-acetaminophen (NORCO/VICODIN) 5-325 MG tablet Take 1 tablet by mouth every 4 (four) hours as needed.    . hydrocortisone (ANUSOL-HC) 2.5 % rectal cream Place 1 application rectally 2 (two) times daily as needed for hemorrhoids or anal itching.    . hydrocortisone cream 0.5 % Apply 1 application topically 2 (two) times daily as needed for itching (for rash).    . Hypertonic Nasal Wash (SINUS RINSE NA) Place into the nose daily as needed (for sinusitis).    Marland Kitchen levothyroxine (SYNTHROID, LEVOTHROID) 75 MCG tablet Take 75 mcg by mouth daily before breakfast.     . loperamide (IMODIUM) 2 MG capsule Take 2 mg by mouth every 4 (four) hours as needed for diarrhea or loose stools.    . magnesium hydroxide (MILK OF MAGNESIA) 400 MG/5ML suspension Take by mouth 2 (two) times daily as needed for mild constipation (0.68ms administered as needed).    . meclizine (ANTIVERT) 25 MG tablet Take 25 mg by mouth every 6 (six) hours as needed for dizziness.    . mineral oil liquid Place in ear(s) every Thursday. 2 drops in both ears once daily on Thursdays for Cerumen Impaction    . multivitamin-lutein (OCUVITE-LUTEIN) CAPS capsule Take 1 capsule by mouth 2 (two) times daily.    . Olopatadine HCl (PATADAY) 0.2 % SOLN Place 1 drop into both eyes daily.    . ondansetron (ZOFRAN) 8 MG tablet Take 8 mg by  mouth every 8 (eight) hours as needed for nausea or vomiting.    .Marland Kitchenoxycodone (OXY-IR) 5 MG capsule Take 5 mg by mouth every 4 (four) hours as needed for pain.    . pantoprazole (PROTONIX) 40 MG tablet Take 40 mg by mouth daily.    . polyethylene glycol (MIRALAX / GLYCOLAX) packet Take 17 g by mouth daily.    . predniSONE (DELTASONE) 5 MG tablet Take 5 mg by mouth daily with breakfast.    . prochlorperazine (COMPAZINE) 10 MG tablet Take 10 mg by mouth every 6 (six) hours as needed for nausea or vomiting.    . senna (SENOKOT) 8.6 MG tablet Take 1 tablet  by mouth 2 (two) times daily.     No current facility-administered medications for this visit.     Allergies:   Tramadol and Sulfa antibiotics   Social History:  The patient  reports that she has never smoked. She has never used smokeless tobacco. She reports that she does not drink alcohol or use drugs.   Family History:  The patient's  family history includes Cancer in her brother and father; Heart attack in her mother.   ROS:  Please see the history of present illness.   All other systems are personally reviewed and negative.    Exam:    Vital Signs:  BP - 134/78, P - 60   Labs/Other Tests and Data Reviewed:    Recent Labs: 10/14/2018: ALT 10; BUN 19; Creatinine, Ser 1.05; Potassium 3.7; Sodium 135 12/18/2018: Hemoglobin 9.4; Platelets 300   Wt Readings from Last 3 Encounters:  01/01/18 134 lb 9.6 oz (61.1 kg)  09/26/17 129 lb (58.5 kg)  07/04/17 134 lb 4.2 oz (60.9 kg)     Other studies personally reviewed:  Last device remote is reviewed from [facility] PDF dated 12/08/18 which reveals normal device function, no arrhythmias    ASSESSMENT & PLAN:    1.  Sinus node dysfunction - she is asymptomatic, s/p PPM insertion.  2. PPM - her [employer] DDD PM is working normally. We will recheck in several months. 3. HTN - her blood pressure is only minimally elevated. We will follow.   COVID 19 screen The patient denies symptoms of  COVID 19 at this time.  The importance of social distancing was discussed today.  Follow-up:  12 months Next remote: 3 months  Current medicines are reviewed at length with the patient today.   The patient does not have concerns regarding her medicines.  The following changes were made today:  none  Labs/ tests ordered today include: none No orders of the defined types were placed in this encounter.    Patient Risk:  after full review of this patients clinical status, I feel that they are at moderate risk at this time.  Today, I have spent 15 minutes with the patient with telehealth technology discussing all of the above .    Signed, Cristopher Peru, MD  01/07/2019 1:59 PM     [place] [address] [phone] (office) [phone] (fax)

## 2019-01-19 DIAGNOSIS — R5383 Other fatigue: Secondary | ICD-10-CM | POA: Diagnosis not present

## 2019-01-19 DIAGNOSIS — R627 Adult failure to thrive: Secondary | ICD-10-CM | POA: Diagnosis not present

## 2019-01-19 DIAGNOSIS — C9000 Multiple myeloma not having achieved remission: Secondary | ICD-10-CM | POA: Diagnosis not present

## 2019-01-19 DIAGNOSIS — D649 Anemia, unspecified: Secondary | ICD-10-CM | POA: Diagnosis not present

## 2019-01-19 DIAGNOSIS — Z6825 Body mass index (BMI) 25.0-25.9, adult: Secondary | ICD-10-CM | POA: Diagnosis not present

## 2019-01-19 DIAGNOSIS — Z79899 Other long term (current) drug therapy: Secondary | ICD-10-CM | POA: Diagnosis not present

## 2019-01-19 DIAGNOSIS — Z87898 Personal history of other specified conditions: Secondary | ICD-10-CM | POA: Diagnosis not present

## 2019-01-19 DIAGNOSIS — Z79891 Long term (current) use of opiate analgesic: Secondary | ICD-10-CM | POA: Diagnosis not present

## 2019-01-19 DIAGNOSIS — R944 Abnormal results of kidney function studies: Secondary | ICD-10-CM | POA: Diagnosis not present

## 2019-01-26 DIAGNOSIS — Z23 Encounter for immunization: Secondary | ICD-10-CM | POA: Diagnosis not present

## 2019-01-28 DIAGNOSIS — Z03818 Encounter for observation for suspected exposure to other biological agents ruled out: Secondary | ICD-10-CM | POA: Diagnosis not present

## 2019-02-04 DIAGNOSIS — Z03818 Encounter for observation for suspected exposure to other biological agents ruled out: Secondary | ICD-10-CM | POA: Diagnosis not present

## 2019-02-04 DIAGNOSIS — J019 Acute sinusitis, unspecified: Secondary | ICD-10-CM | POA: Diagnosis not present

## 2019-02-19 DIAGNOSIS — D225 Melanocytic nevi of trunk: Secondary | ICD-10-CM | POA: Diagnosis not present

## 2019-02-19 DIAGNOSIS — Z03818 Encounter for observation for suspected exposure to other biological agents ruled out: Secondary | ICD-10-CM | POA: Diagnosis not present

## 2019-02-19 DIAGNOSIS — L820 Inflamed seborrheic keratosis: Secondary | ICD-10-CM | POA: Diagnosis not present

## 2019-02-19 DIAGNOSIS — Z1283 Encounter for screening for malignant neoplasm of skin: Secondary | ICD-10-CM | POA: Diagnosis not present

## 2019-02-25 DIAGNOSIS — Z03818 Encounter for observation for suspected exposure to other biological agents ruled out: Secondary | ICD-10-CM | POA: Diagnosis not present

## 2019-03-03 DIAGNOSIS — M79674 Pain in right toe(s): Secondary | ICD-10-CM | POA: Diagnosis not present

## 2019-03-03 DIAGNOSIS — M79675 Pain in left toe(s): Secondary | ICD-10-CM | POA: Diagnosis not present

## 2019-03-03 DIAGNOSIS — B351 Tinea unguium: Secondary | ICD-10-CM | POA: Diagnosis not present

## 2019-03-04 DIAGNOSIS — Z03818 Encounter for observation for suspected exposure to other biological agents ruled out: Secondary | ICD-10-CM | POA: Diagnosis not present

## 2019-03-05 IMAGING — CR DG CHEST 1V PORT
1 series · 1 of 1 positions shown · non-contrast
Comparison: 11/25/2015

CLINICAL DATA: Fever, vomiting

EXAM:
PORTABLE CHEST 1 VIEW

[portable]
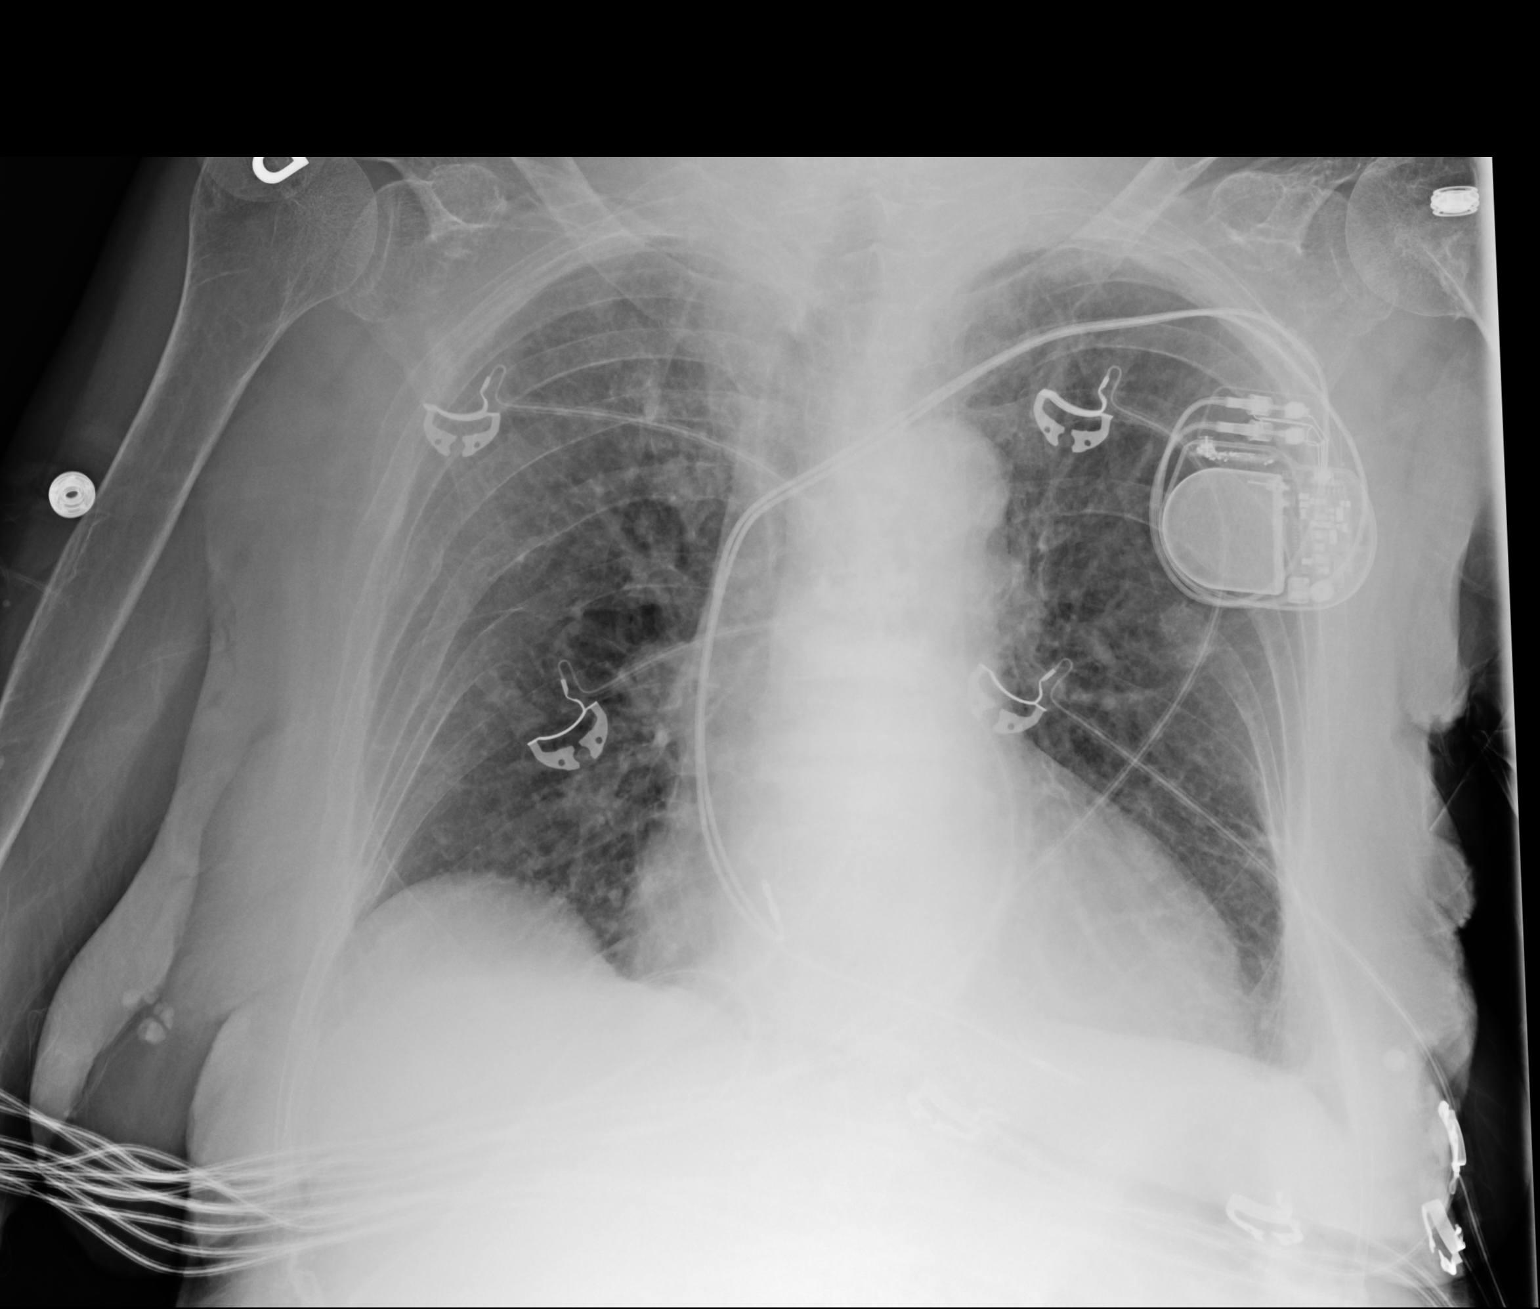

[1 of 1 positions shown; findings below may reference images not displayed]

FINDINGS: Left pacer remains in place, unchanged. Mild cardiomegaly.
Interstitial prominence could reflect early interstitial edema. No
confluent opacities or effusions. No acute bony abnormality.
IMPRESSION: Cardiomegaly. Interstitial prominence could reflect interstitial
edema.

## 2019-03-09 ENCOUNTER — Ambulatory Visit (INDEPENDENT_AMBULATORY_CARE_PROVIDER_SITE_OTHER): Payer: Medicare Other | Admitting: *Deleted

## 2019-03-09 DIAGNOSIS — I480 Paroxysmal atrial fibrillation: Secondary | ICD-10-CM | POA: Diagnosis not present

## 2019-03-09 DIAGNOSIS — R002 Palpitations: Secondary | ICD-10-CM

## 2019-03-10 ENCOUNTER — Telehealth: Payer: Self-pay | Admitting: Internal Medicine

## 2019-03-10 LAB — CUP PACEART REMOTE DEVICE CHECK
Battery Remaining Longevity: 58 mo
Battery Remaining Percentage: 51 %
Battery Voltage: 2.87 V
Brady Statistic AP VP Percent: 1 %
Brady Statistic AP VS Percent: 53 %
Brady Statistic AS VP Percent: 1 %
Brady Statistic AS VS Percent: 47 %
Brady Statistic RA Percent Paced: 45 %
Brady Statistic RV Percent Paced: 1 %
Date Time Interrogation Session: 20201116070007
Implantable Lead Implant Date: 20130214
Implantable Lead Implant Date: 20130214
Implantable Lead Location: 753859
Implantable Lead Location: 753860
Implantable Pulse Generator Implant Date: 20130214
Lead Channel Impedance Value: 380 Ohm
Lead Channel Impedance Value: 400 Ohm
Lead Channel Pacing Threshold Amplitude: 0.75 V
Lead Channel Pacing Threshold Amplitude: 1.25 V
Lead Channel Pacing Threshold Pulse Width: 0.5 ms
Lead Channel Pacing Threshold Pulse Width: 0.5 ms
Lead Channel Sensing Intrinsic Amplitude: 12 mV
Lead Channel Sensing Intrinsic Amplitude: 2.7 mV
Lead Channel Setting Pacing Amplitude: 2 V
Lead Channel Setting Pacing Amplitude: 2.5 V
Lead Channel Setting Pacing Pulse Width: 0.5 ms
Lead Channel Setting Sensing Sensitivity: 2 mV
Pulse Gen Model: 2210
Pulse Gen Serial Number: 7316836

## 2019-03-10 NOTE — Telephone Encounter (Signed)
° °  1. Has your device fired? No  2. Is you device beeping? No  3. Are you experiencing draining or swelling at device site? No  4. Are you calling to see if we received your device transmission? Yes  5. Have you passed out? No  Michelle from [facility] is calling in regards to see if we have received the Patient's transmission device.    Please route to [place]

## 2019-03-11 NOTE — Telephone Encounter (Signed)
I left a message for Sharyn Lull that we did receive the pt transmission.

## 2019-04-04 NOTE — Progress Notes (Signed)
Remote pacemaker transmission.   

## 2019-04-21 DIAGNOSIS — Z20828 Contact with and (suspected) exposure to other viral communicable diseases: Secondary | ICD-10-CM | POA: Diagnosis not present

## 2019-04-27 DIAGNOSIS — Z79891 Long term (current) use of opiate analgesic: Secondary | ICD-10-CM | POA: Diagnosis not present

## 2019-04-27 DIAGNOSIS — D649 Anemia, unspecified: Secondary | ICD-10-CM | POA: Diagnosis not present

## 2019-04-27 DIAGNOSIS — N189 Chronic kidney disease, unspecified: Secondary | ICD-10-CM | POA: Diagnosis not present

## 2019-04-27 DIAGNOSIS — Z87898 Personal history of other specified conditions: Secondary | ICD-10-CM | POA: Diagnosis not present

## 2019-04-27 DIAGNOSIS — Z6824 Body mass index (BMI) 24.0-24.9, adult: Secondary | ICD-10-CM | POA: Diagnosis not present

## 2019-04-27 DIAGNOSIS — C9000 Multiple myeloma not having achieved remission: Secondary | ICD-10-CM | POA: Diagnosis not present

## 2019-04-27 DIAGNOSIS — R627 Adult failure to thrive: Secondary | ICD-10-CM | POA: Diagnosis not present

## 2019-04-27 DIAGNOSIS — R5383 Other fatigue: Secondary | ICD-10-CM | POA: Diagnosis not present

## 2019-04-27 DIAGNOSIS — Z79899 Other long term (current) drug therapy: Secondary | ICD-10-CM | POA: Diagnosis not present

## 2019-04-28 DIAGNOSIS — Z23 Encounter for immunization: Secondary | ICD-10-CM | POA: Diagnosis not present

## 2019-05-13 DIAGNOSIS — Z20828 Contact with and (suspected) exposure to other viral communicable diseases: Secondary | ICD-10-CM | POA: Diagnosis not present

## 2019-05-14 DIAGNOSIS — H903 Sensorineural hearing loss, bilateral: Secondary | ICD-10-CM | POA: Diagnosis not present

## 2019-05-14 DIAGNOSIS — H838X3 Other specified diseases of inner ear, bilateral: Secondary | ICD-10-CM | POA: Diagnosis not present

## 2019-05-14 DIAGNOSIS — H6123 Impacted cerumen, bilateral: Secondary | ICD-10-CM | POA: Diagnosis not present

## 2019-05-19 DIAGNOSIS — M25562 Pain in left knee: Secondary | ICD-10-CM | POA: Diagnosis not present

## 2019-05-19 DIAGNOSIS — M25552 Pain in left hip: Secondary | ICD-10-CM | POA: Diagnosis not present

## 2019-05-20 DIAGNOSIS — Z03818 Encounter for observation for suspected exposure to other biological agents ruled out: Secondary | ICD-10-CM | POA: Diagnosis not present

## 2019-05-21 DIAGNOSIS — M353 Polymyalgia rheumatica: Secondary | ICD-10-CM | POA: Diagnosis not present

## 2019-05-21 DIAGNOSIS — I10 Essential (primary) hypertension: Secondary | ICD-10-CM | POA: Diagnosis not present

## 2019-05-21 DIAGNOSIS — C9000 Multiple myeloma not having achieved remission: Secondary | ICD-10-CM | POA: Diagnosis not present

## 2019-05-21 DIAGNOSIS — H04123 Dry eye syndrome of bilateral lacrimal glands: Secondary | ICD-10-CM | POA: Diagnosis not present

## 2019-05-21 DIAGNOSIS — F339 Major depressive disorder, recurrent, unspecified: Secondary | ICD-10-CM | POA: Diagnosis not present

## 2019-05-21 DIAGNOSIS — E039 Hypothyroidism, unspecified: Secondary | ICD-10-CM | POA: Diagnosis not present

## 2019-05-21 DIAGNOSIS — F329 Major depressive disorder, single episode, unspecified: Secondary | ICD-10-CM | POA: Diagnosis not present

## 2019-05-21 DIAGNOSIS — D649 Anemia, unspecified: Secondary | ICD-10-CM | POA: Diagnosis not present

## 2019-05-21 DIAGNOSIS — K5792 Diverticulitis of intestine, part unspecified, without perforation or abscess without bleeding: Secondary | ICD-10-CM | POA: Diagnosis not present

## 2019-05-21 DIAGNOSIS — J309 Allergic rhinitis, unspecified: Secondary | ICD-10-CM | POA: Diagnosis not present

## 2019-05-21 DIAGNOSIS — K5904 Chronic idiopathic constipation: Secondary | ICD-10-CM | POA: Diagnosis not present

## 2019-05-21 DIAGNOSIS — H353130 Nonexudative age-related macular degeneration, bilateral, stage unspecified: Secondary | ICD-10-CM | POA: Diagnosis not present

## 2019-05-21 DIAGNOSIS — K219 Gastro-esophageal reflux disease without esophagitis: Secondary | ICD-10-CM | POA: Diagnosis not present

## 2019-05-26 DIAGNOSIS — Z23 Encounter for immunization: Secondary | ICD-10-CM | POA: Diagnosis not present

## 2019-05-27 DIAGNOSIS — Z03818 Encounter for observation for suspected exposure to other biological agents ruled out: Secondary | ICD-10-CM | POA: Diagnosis not present

## 2019-05-28 DIAGNOSIS — I10 Essential (primary) hypertension: Secondary | ICD-10-CM | POA: Diagnosis not present

## 2019-05-28 DIAGNOSIS — E119 Type 2 diabetes mellitus without complications: Secondary | ICD-10-CM | POA: Diagnosis not present

## 2019-05-28 DIAGNOSIS — E039 Hypothyroidism, unspecified: Secondary | ICD-10-CM | POA: Diagnosis not present

## 2019-05-28 DIAGNOSIS — D649 Anemia, unspecified: Secondary | ICD-10-CM | POA: Diagnosis not present

## 2019-05-28 DIAGNOSIS — M353 Polymyalgia rheumatica: Secondary | ICD-10-CM | POA: Diagnosis not present

## 2019-06-01 DIAGNOSIS — Z03818 Encounter for observation for suspected exposure to other biological agents ruled out: Secondary | ICD-10-CM | POA: Diagnosis not present

## 2019-06-03 DIAGNOSIS — F329 Major depressive disorder, single episode, unspecified: Secondary | ICD-10-CM | POA: Diagnosis not present

## 2019-06-03 DIAGNOSIS — Z03818 Encounter for observation for suspected exposure to other biological agents ruled out: Secondary | ICD-10-CM | POA: Diagnosis not present

## 2019-06-03 DIAGNOSIS — F419 Anxiety disorder, unspecified: Secondary | ICD-10-CM | POA: Diagnosis not present

## 2019-06-04 DIAGNOSIS — G4700 Insomnia, unspecified: Secondary | ICD-10-CM | POA: Diagnosis not present

## 2019-06-08 DIAGNOSIS — Z03818 Encounter for observation for suspected exposure to other biological agents ruled out: Secondary | ICD-10-CM | POA: Diagnosis not present

## 2019-06-09 ENCOUNTER — Telehealth: Payer: Self-pay

## 2019-06-09 NOTE — Telephone Encounter (Signed)
[name] called to let us know that the pt will be receiving a new monitor.

## 2019-06-10 DIAGNOSIS — Z03818 Encounter for observation for suspected exposure to other biological agents ruled out: Secondary | ICD-10-CM | POA: Diagnosis not present

## 2019-06-15 DIAGNOSIS — K5900 Constipation, unspecified: Secondary | ICD-10-CM | POA: Diagnosis not present

## 2019-06-15 DIAGNOSIS — D649 Anemia, unspecified: Secondary | ICD-10-CM | POA: Diagnosis not present

## 2019-06-15 DIAGNOSIS — Z03818 Encounter for observation for suspected exposure to other biological agents ruled out: Secondary | ICD-10-CM | POA: Diagnosis not present

## 2019-06-15 DIAGNOSIS — J309 Allergic rhinitis, unspecified: Secondary | ICD-10-CM | POA: Diagnosis not present

## 2019-06-17 DIAGNOSIS — M79675 Pain in left toe(s): Secondary | ICD-10-CM | POA: Diagnosis not present

## 2019-06-17 DIAGNOSIS — B351 Tinea unguium: Secondary | ICD-10-CM | POA: Diagnosis not present

## 2019-06-17 DIAGNOSIS — Z03818 Encounter for observation for suspected exposure to other biological agents ruled out: Secondary | ICD-10-CM | POA: Diagnosis not present

## 2019-06-17 DIAGNOSIS — M79674 Pain in right toe(s): Secondary | ICD-10-CM | POA: Diagnosis not present

## 2019-06-24 DIAGNOSIS — M25562 Pain in left knee: Secondary | ICD-10-CM | POA: Diagnosis not present

## 2019-06-24 DIAGNOSIS — M353 Polymyalgia rheumatica: Secondary | ICD-10-CM | POA: Diagnosis not present

## 2019-06-24 DIAGNOSIS — M25552 Pain in left hip: Secondary | ICD-10-CM | POA: Diagnosis not present

## 2019-07-03 DIAGNOSIS — G4700 Insomnia, unspecified: Secondary | ICD-10-CM | POA: Diagnosis not present

## 2019-07-03 DIAGNOSIS — M25562 Pain in left knee: Secondary | ICD-10-CM | POA: Diagnosis not present

## 2019-07-03 DIAGNOSIS — F411 Generalized anxiety disorder: Secondary | ICD-10-CM | POA: Diagnosis not present

## 2019-07-03 DIAGNOSIS — F339 Major depressive disorder, recurrent, unspecified: Secondary | ICD-10-CM | POA: Diagnosis not present

## 2019-07-15 ENCOUNTER — Other Ambulatory Visit: Payer: Self-pay

## 2019-07-15 ENCOUNTER — Telehealth: Payer: Self-pay

## 2019-07-15 ENCOUNTER — Encounter: Payer: Self-pay | Admitting: Orthopedic Surgery

## 2019-07-15 ENCOUNTER — Ambulatory Visit (INDEPENDENT_AMBULATORY_CARE_PROVIDER_SITE_OTHER): Payer: Medicare Other | Admitting: Orthopedic Surgery

## 2019-07-15 ENCOUNTER — Ambulatory Visit: Payer: Medicare Other

## 2019-07-15 VITALS — BP 110/70 | HR 74 | Temp 97.2°F | Ht 63.0 in

## 2019-07-15 DIAGNOSIS — G8929 Other chronic pain: Secondary | ICD-10-CM

## 2019-07-15 DIAGNOSIS — M5442 Lumbago with sciatica, left side: Secondary | ICD-10-CM

## 2019-07-15 DIAGNOSIS — K5790 Diverticulosis of intestine, part unspecified, without perforation or abscess without bleeding: Secondary | ICD-10-CM | POA: Diagnosis not present

## 2019-07-15 DIAGNOSIS — K219 Gastro-esophageal reflux disease without esophagitis: Secondary | ICD-10-CM | POA: Diagnosis not present

## 2019-07-15 DIAGNOSIS — F0391 Unspecified dementia with behavioral disturbance: Secondary | ICD-10-CM | POA: Diagnosis not present

## 2019-07-15 DIAGNOSIS — M25562 Pain in left knee: Secondary | ICD-10-CM | POA: Diagnosis not present

## 2019-07-15 DIAGNOSIS — F419 Anxiety disorder, unspecified: Secondary | ICD-10-CM | POA: Diagnosis not present

## 2019-07-15 MED ORDER — PREDNISONE 10 MG (48) PO TBPK: ORAL_TABLET | Freq: Every day | ORAL | 0 refills | Status: DC

## 2019-07-15 NOTE — Telephone Encounter (Signed)
Resent to correct pharmacy.

## 2019-07-15 NOTE — Patient Instructions (Addendum)
1.  Scoliosis degenerative disc disease spondylosis causing radicular pain left leg  Treatment Sterapred Dosepak taper for 10 days and then resume normal prednisone  2.  Osteoarthritis left knee status post injection, recommend brace for walking can remove at night  Continue hydrocodone, Tylenol, meloxicam  You have received an injection of steroids into the joint. 15% of patients will have increased pain within the 24 hours postinjection.   This is transient and will go away.   We recommend that you use ice packs on the injection site for 20 minutes every 2 hours and extra strength Tylenol 2 tablets every 8 as needed until the pain resolves.  If you continue to have pain after taking the Tylenol and using the ice please call the office for further instructions.    Radicular Pain Radicular pain is a type of pain that spreads from your back or neck along a spinal nerve. Spinal nerves are nerves that leave the spinal cord and go to the muscles. Radicular pain is sometimes called radiculopathy, radiculitis, or a pinched nerve. When you have this type of pain, you may also have weakness, numbness, or tingling in the area of your body that is supplied by the nerve. The pain may feel sharp and burning. Depending on which spinal nerve is affected, the pain may occur in the:  Neck area (cervical radicular pain). You may also feel pain, numbness, weakness, or tingling in the arms.  Mid-spine area (thoracic radicular pain). You would feel this pain in the back and chest. This type is rare.  Lower back area (lumbar radicular pain). You would feel this pain as low back pain. You may feel pain, numbness, weakness, or tingling in the buttocks or legs. Sciatica is a type of lumbar radicular pain that shoots down the back of the leg. Radicular pain occurs when one of the spinal nerves becomes irritated or squeezed (compressed). It is often caused by something pushing on a spinal nerve, such as one of the  bones of the spine (vertebrae) or one of the round cushions between vertebrae (intervertebral disks). This can result from:  An injury.  Wear and tear or aging of a disk.  The growth of a bone spur that pushes on the nerve. Radicular pain often goes away when you follow instructions from your health care provider for relieving pain at home. Follow these instructions at home: Managing pain      If directed, put ice on the affected area: ? Put ice in a plastic bag. ? Place a towel between your skin and the bag. ? Leave the ice on for 20 minutes, 2-3 times a day.  If directed, apply heat to the affected area as often as told by your health care provider. Use the heat source that your health care provider recommends, such as a moist heat pack or a heating pad. ? Place a towel between your skin and the heat source. ? Leave the heat on for 20-30 minutes. ? Remove the heat if your skin turns bright red. This is especially important if you are unable to feel pain, heat, or cold. You may have a greater risk of getting burned. Activity   Do not sit or rest in bed for long periods of time.  Try to stay as active as possible. Ask your health care provider what type of exercise or activity is best for you.  Avoid activities that make your pain worse, such as bending and lifting.  Do not lift anything  that is heavier than 10 lb (4.5 kg), or the limit that you are told, until your health care provider says that it is safe.  Practice using proper technique when lifting items. Proper lifting technique involves bending your knees and rising up.  Do strength and range-of-motion exercises only as told by your health care provider or physical therapist. General instructions  Take over-the-counter and prescription medicines only as told by your health care provider.  Pay attention to any changes in your symptoms.  Keep all follow-up visits as told by your health care provider. This is  important. ? Your health care provider may send you to a physical therapist to help with this pain. Contact a health care provider if:  Your pain and other symptoms get worse.  Your pain medicine is not helping.  Your pain has not improved after a few weeks of home care.  You have a fever. Get help right away if:  You have severe pain, weakness, or numbness.  You have difficulty with bladder or bowel control. Summary  Radicular pain is a type of pain that spreads from your back or neck along a spinal nerve.  When you have radicular pain, you may also have weakness, numbness, or tingling in the area of your body that is supplied by the nerve.  The pain may feel sharp or burning.  Radicular pain may be treated with ice, heat, medicines, or physical therapy. This information is not intended to replace advice given to you by your health care provider. Make sure you discuss any questions you have with your health care provider. Document Revised: 10/22/2017 Document Reviewed: 10/22/2017 Elsevier Patient Education  [place].

## 2019-07-15 NOTE — Progress Notes (Signed)
Chief Complaint  Patient presents with  . Back Pain    Back pain with radiation.    86 year old female history of multiple myeloma status post gamma nail fixation 4 to 5 years ago in [place] for left hip fracture presents with complaints of left leg pain thigh to knee left knee pain and history of lower back pain  She is ambulatory with a walker residing at [facility]  She is currently on 5 mg of prednisone a day meloxicam Tylenol for back pain and occasional hydrocodone but that causes significant amount of constipation  She had a mobile x-ray I have the report it shows moderate arthritis in the left hip joint and mild arthritis in the left knee.  Short gamma nail no complications and that  Review of Systems  HENT: Positive for hearing loss.   Eyes: Positive for blurred vision.  Musculoskeletal: Positive for back pain and joint pain.  All other systems reviewed and are negative.  Past Medical History:  Diagnosis Date  . Anemia   . Bradycardia   . Chronic back pain   . Constipation   . Degenerative joint disease   . Diverticulosis   . External hemorrhoids   . GERD (gastroesophageal reflux disease)   . Goiter, unspecified   . Hyperlipidemia   . Hypertension   . Hypothyroidism   . Macular degeneration   . Multiple myeloma ([place]) 03/12/2016  . Osteoporosis   . Pacemaker   . Palpitations   . Polymyalgia rheumatica ([place])   . Seasonal allergies   . Thyrotoxicosis without mention of goiter or other cause, without mention of thyrotoxic crisis or storm    Past Surgical History:  Procedure Laterality Date  . COLONOSCOPY  November 2011   External hemorrhoids, diverticulosis, anal papillae  . COLONOSCOPY N/A 04/01/2014   Procedure: COLONOSCOPY;  Surgeon: Rogene Houston, MD;  [facility];  Service: Endoscopy;  Laterality: N/A;  930  . FEMUR IM NAIL Left 12/29/2012   Procedure: INTRAMEDULLARY (IM) NAIL INTERTROCHANTRIC;  Surgeon: Melina Schools, MD;   Location: [place];  Service: Orthopedics;  Laterality: Left;  . INSERT / REPLACE / REMOVE PACEMAKER    . PERMANENT PACEMAKER INSERTION N/A 06/07/2011   Procedure: PERMANENT PACEMAKER INSERTION;  Surgeon: Evans Lance, MD;  Location: [facility] CATH LAB;  Service: Cardiovascular;  Laterality: N/A;  . THYROIDECTOMY    . TONSILLECTOMY    . vertebroplasty      BP 110/70   Pulse 74   Temp (!) 97.2 F (36.2 C)   Ht 5' 3"  (1.6 m)   BMI 23.84 kg/m   She is actually in good shape her body weight is good she is oriented x3 she has good mood and affect she ambulates with a walker with some discomfort and instability of the left leg secondary to pain  Right hip the skin is normal she has no tenderness excellent flexion stable muscle tone is good distal pulses are intact and sensation is normal  On the left hip have good flexion skin is normal stability tests were good strength was good muscle tone is good  Distal neurovascular exam is intact  She was tender in her back midline and left gluteal area lateral thigh and lower lateral leg  Encounter Diagnoses  Name Primary?  . Chronic left-sided low back pain with left-sided sciatica Yes  . Chronic pain of left knee     Internal x-rays show scoliosis osteopenia and degenerative disc disease see report Pedicles were  intact  No bony destruction noted   Plan  1.  Scoliosis degenerative disc disease spondylosis causing radicular pain left leg  Treatment Sterapred Dosepak taper for 10 days and then resume normal prednisone  2.  Osteoarthritis left knee status post injection, recommend brace for walking can remove at night  Continue hydrocodone, Tylenol, meloxicam  You have received an injection of steroids into the joint. 15% of patients will have increased pain within the 24 hours postinjection.   This is transient and will go away.   We recommend that you use ice packs on the injection site for 20 minutes every 2 hours and extra strength  Tylenol 2 tablets every 8 as needed until the pain resolves.  If you continue to have pain after taking the Tylenol and using the ice please call the office for further instructions.  Procedure note left knee injection   verbal consent was obtained to inject left knee joint  Timeout was completed to confirm the site of injection  The medications used were 40 mg of Depo-Medrol and 1% lidocaine 3 cc  Anesthesia was provided by ethyl chloride and the skin was prepped with alcohol.  After cleaning the skin with alcohol a 20-gauge needle was used to inject the left knee joint. There were no complications. A sterile bandage was applied.  Meds ordered this encounter  Medications  . predniSONE (STERAPRED UNI-PAK 48 TAB) 10 MG (48) TBPK tablet    Sig: Take by mouth daily.    Dispense:  48 tablet    Refill:  0

## 2019-07-15 NOTE — Telephone Encounter (Signed)
Janett Billow from [facility] called stating that in patient's AVS it states that she was prescribed Prednisone. Nanine Means uses [facility] in [place], [place]. The prescription was sent to [facility], Janett Billow is asking if that could be sent to [facility] instead.  I have changed pharmacy in patient's chart to [facility] in [place], [place]

## 2019-07-22 DIAGNOSIS — M353 Polymyalgia rheumatica: Secondary | ICD-10-CM | POA: Diagnosis not present

## 2019-07-22 DIAGNOSIS — M25562 Pain in left knee: Secondary | ICD-10-CM | POA: Diagnosis not present

## 2019-07-22 DIAGNOSIS — M25552 Pain in left hip: Secondary | ICD-10-CM | POA: Diagnosis not present

## 2019-07-27 DIAGNOSIS — C9000 Multiple myeloma not having achieved remission: Secondary | ICD-10-CM | POA: Diagnosis not present

## 2019-07-31 ENCOUNTER — Ambulatory Visit (INDEPENDENT_AMBULATORY_CARE_PROVIDER_SITE_OTHER): Payer: Medicare Other | Admitting: *Deleted

## 2019-07-31 DIAGNOSIS — F0391 Unspecified dementia with behavioral disturbance: Secondary | ICD-10-CM | POA: Diagnosis not present

## 2019-07-31 DIAGNOSIS — Z950 Presence of cardiac pacemaker: Secondary | ICD-10-CM

## 2019-07-31 DIAGNOSIS — G4700 Insomnia, unspecified: Secondary | ICD-10-CM | POA: Diagnosis not present

## 2019-07-31 DIAGNOSIS — J302 Other seasonal allergic rhinitis: Secondary | ICD-10-CM | POA: Diagnosis not present

## 2019-07-31 DIAGNOSIS — L03116 Cellulitis of left lower limb: Secondary | ICD-10-CM | POA: Diagnosis not present

## 2019-07-31 DIAGNOSIS — F411 Generalized anxiety disorder: Secondary | ICD-10-CM | POA: Diagnosis not present

## 2019-07-31 DIAGNOSIS — F339 Major depressive disorder, recurrent, unspecified: Secondary | ICD-10-CM | POA: Diagnosis not present

## 2019-07-31 LAB — CUP PACEART REMOTE DEVICE CHECK
Battery Remaining Longevity: 59 mo
Battery Remaining Percentage: 51 %
Battery Voltage: 2.87 V
Brady Statistic AP VP Percent: 1 %
Brady Statistic AP VS Percent: 51 %
Brady Statistic AS VP Percent: 1 %
Brady Statistic AS VS Percent: 49 %
Brady Statistic RA Percent Paced: 43 %
Brady Statistic RV Percent Paced: 1 %
Date Time Interrogation Session: 20210409120959
Implantable Lead Implant Date: 20130214
Implantable Lead Implant Date: 20130214
Implantable Lead Location: 753859
Implantable Lead Location: 753860
Implantable Pulse Generator Implant Date: 20130214
Lead Channel Impedance Value: 410 Ohm
Lead Channel Impedance Value: 460 Ohm
Lead Channel Pacing Threshold Amplitude: 0.75 V
Lead Channel Pacing Threshold Amplitude: 1.25 V
Lead Channel Pacing Threshold Pulse Width: 0.5 ms
Lead Channel Pacing Threshold Pulse Width: 0.5 ms
Lead Channel Sensing Intrinsic Amplitude: 12 mV
Lead Channel Sensing Intrinsic Amplitude: 4.2 mV
Lead Channel Setting Pacing Amplitude: 2 V
Lead Channel Setting Pacing Amplitude: 2.5 V
Lead Channel Setting Pacing Pulse Width: 0.5 ms
Lead Channel Setting Sensing Sensitivity: 2 mV
Pulse Gen Model: 2210
Pulse Gen Serial Number: 7316836

## 2019-07-31 NOTE — Progress Notes (Signed)
PPM Remote  

## 2019-08-03 DIAGNOSIS — M7989 Other specified soft tissue disorders: Secondary | ICD-10-CM | POA: Diagnosis not present

## 2019-08-03 DIAGNOSIS — Z6825 Body mass index (BMI) 25.0-25.9, adult: Secondary | ICD-10-CM | POA: Diagnosis not present

## 2019-08-03 DIAGNOSIS — R739 Hyperglycemia, unspecified: Secondary | ICD-10-CM | POA: Diagnosis not present

## 2019-08-03 DIAGNOSIS — M79605 Pain in left leg: Secondary | ICD-10-CM | POA: Diagnosis not present

## 2019-08-03 DIAGNOSIS — G8929 Other chronic pain: Secondary | ICD-10-CM | POA: Diagnosis not present

## 2019-08-03 DIAGNOSIS — C9000 Multiple myeloma not having achieved remission: Secondary | ICD-10-CM | POA: Diagnosis not present

## 2019-08-03 DIAGNOSIS — L0390 Cellulitis, unspecified: Secondary | ICD-10-CM | POA: Diagnosis not present

## 2019-08-03 DIAGNOSIS — Z09 Encounter for follow-up examination after completed treatment for conditions other than malignant neoplasm: Secondary | ICD-10-CM | POA: Diagnosis not present

## 2019-08-03 DIAGNOSIS — M25472 Effusion, left ankle: Secondary | ICD-10-CM | POA: Diagnosis not present

## 2019-08-03 DIAGNOSIS — R5383 Other fatigue: Secondary | ICD-10-CM | POA: Diagnosis not present

## 2019-08-03 DIAGNOSIS — R600 Localized edema: Secondary | ICD-10-CM | POA: Diagnosis not present

## 2019-08-03 DIAGNOSIS — D649 Anemia, unspecified: Secondary | ICD-10-CM | POA: Diagnosis not present

## 2019-08-03 DIAGNOSIS — M810 Age-related osteoporosis without current pathological fracture: Secondary | ICD-10-CM | POA: Diagnosis not present

## 2019-08-03 DIAGNOSIS — Z79899 Other long term (current) drug therapy: Secondary | ICD-10-CM | POA: Diagnosis not present

## 2019-08-03 DIAGNOSIS — D72829 Elevated white blood cell count, unspecified: Secondary | ICD-10-CM | POA: Diagnosis not present

## 2019-08-03 DIAGNOSIS — M549 Dorsalgia, unspecified: Secondary | ICD-10-CM | POA: Diagnosis not present

## 2019-08-03 DIAGNOSIS — L539 Erythematous condition, unspecified: Secondary | ICD-10-CM | POA: Diagnosis not present

## 2019-08-03 DIAGNOSIS — E871 Hypo-osmolality and hyponatremia: Secondary | ICD-10-CM | POA: Diagnosis not present

## 2019-08-03 DIAGNOSIS — R627 Adult failure to thrive: Secondary | ICD-10-CM | POA: Diagnosis not present

## 2019-08-04 ENCOUNTER — Other Ambulatory Visit: Payer: Self-pay | Admitting: Internal Medicine

## 2019-08-04 ENCOUNTER — Other Ambulatory Visit (HOSPITAL_COMMUNITY): Payer: Self-pay | Admitting: Internal Medicine

## 2019-08-04 DIAGNOSIS — L03116 Cellulitis of left lower limb: Secondary | ICD-10-CM

## 2019-08-07 DIAGNOSIS — I10 Essential (primary) hypertension: Secondary | ICD-10-CM | POA: Diagnosis not present

## 2019-08-09 ENCOUNTER — Emergency Department (HOSPITAL_COMMUNITY): Payer: Medicare Other

## 2019-08-09 ENCOUNTER — Emergency Department (HOSPITAL_COMMUNITY)
Admission: EM | Admit: 2019-08-09 | Discharge: 2019-08-09 | Disposition: A | Discharge status: Home / Self Care | Payer: Medicare Other | Attending: Emergency Medicine | Admitting: Emergency Medicine

## 2019-08-09 ENCOUNTER — Other Ambulatory Visit: Payer: Self-pay

## 2019-08-09 ENCOUNTER — Encounter (HOSPITAL_COMMUNITY): Payer: Self-pay

## 2019-08-09 DIAGNOSIS — E039 Hypothyroidism, unspecified: Secondary | ICD-10-CM | POA: Insufficient documentation

## 2019-08-09 DIAGNOSIS — R1111 Vomiting without nausea: Secondary | ICD-10-CM | POA: Diagnosis not present

## 2019-08-09 DIAGNOSIS — Z79899 Other long term (current) drug therapy: Secondary | ICD-10-CM | POA: Diagnosis not present

## 2019-08-09 DIAGNOSIS — R531 Weakness: Secondary | ICD-10-CM | POA: Diagnosis not present

## 2019-08-09 DIAGNOSIS — R55 Syncope and collapse: Secondary | ICD-10-CM | POA: Insufficient documentation

## 2019-08-09 DIAGNOSIS — R4020 Unspecified coma: Secondary | ICD-10-CM | POA: Diagnosis not present

## 2019-08-09 DIAGNOSIS — Z7982 Long term (current) use of aspirin: Secondary | ICD-10-CM | POA: Insufficient documentation

## 2019-08-09 DIAGNOSIS — R1084 Generalized abdominal pain: Secondary | ICD-10-CM | POA: Diagnosis not present

## 2019-08-09 DIAGNOSIS — I10 Essential (primary) hypertension: Secondary | ICD-10-CM | POA: Diagnosis not present

## 2019-08-09 DIAGNOSIS — R52 Pain, unspecified: Secondary | ICD-10-CM | POA: Diagnosis not present

## 2019-08-09 LAB — COMPREHENSIVE METABOLIC PANEL
ALT: 11 U/L (ref 0–44)
AST: 13 U/L — ABNORMAL LOW (ref 15–41)
Albumin: 3.2 g/dL — ABNORMAL LOW (ref 3.5–5.0)
Alkaline Phosphatase: 44 U/L (ref 38–126)
Anion gap: 9 (ref 5–15)
BUN: 25 mg/dL — ABNORMAL HIGH (ref 8–23)
CO2: 26 mmol/L (ref 22–32)
Calcium: 8.6 mg/dL — ABNORMAL LOW (ref 8.9–10.3)
Chloride: 96 mmol/L — ABNORMAL LOW (ref 98–111)
Creatinine, Ser: 0.95 mg/dL (ref 0.44–1.00)
GFR calc Af Amer: 59 mL/min — ABNORMAL LOW (ref >60)
GFR calc non Af Amer: 51 mL/min — ABNORMAL LOW (ref >60)
Glucose, Bld: 108 mg/dL — ABNORMAL HIGH (ref 70–99)
Potassium: 3.9 mmol/L (ref 3.5–5.1)
Sodium: 131 mmol/L — ABNORMAL LOW (ref 135–145)
Total Bilirubin: 0.6 mg/dL (ref 0.3–1.2)
Total Protein: 6.2 g/dL — ABNORMAL LOW (ref 6.5–8.1)

## 2019-08-09 LAB — CBC WITH DIFFERENTIAL/PLATELET
Abs Immature Granulocytes: 0.07 10*3/uL (ref 0.00–0.07)
Basophils Absolute: 0 10*3/uL (ref 0.0–0.1)
Basophils Relative: 1 %
Eosinophils Absolute: 0 10*3/uL (ref 0.0–0.5)
Eosinophils Relative: 0 %
HCT: 28.8 % — ABNORMAL LOW (ref 36.0–46.0)
Hemoglobin: 9.2 g/dL — ABNORMAL LOW (ref 12.0–15.0)
Immature Granulocytes: 1 %
Lymphocytes Relative: 15 %
Lymphs Abs: 1.3 10*3/uL (ref 0.7–4.0)
MCH: 34.8 pg — ABNORMAL HIGH (ref 26.0–34.0)
MCHC: 31.9 g/dL (ref 30.0–36.0)
MCV: 109.1 fL — ABNORMAL HIGH (ref 80.0–100.0)
Monocytes Absolute: 0.7 10*3/uL (ref 0.1–1.0)
Monocytes Relative: 8 %
Neutro Abs: 6.6 10*3/uL (ref 1.7–7.7)
Neutrophils Relative %: 75 %
Platelets: 372 10*3/uL (ref 150–400)
RBC: 2.64 MIL/uL — ABNORMAL LOW (ref 3.87–5.11)
RDW: 16.9 % — ABNORMAL HIGH (ref 11.5–15.5)
WBC: 8.8 10*3/uL (ref 4.0–10.5)
nRBC: 0 % (ref 0.0–0.2)

## 2019-08-09 LAB — URINALYSIS, ROUTINE W REFLEX MICROSCOPIC
Bilirubin Urine: NEGATIVE
Glucose, UA: NEGATIVE mg/dL
Hgb urine dipstick: NEGATIVE
Ketones, ur: NEGATIVE mg/dL
Leukocytes,Ua: NEGATIVE
Nitrite: NEGATIVE
Protein, ur: NEGATIVE mg/dL
Specific Gravity, Urine: 1.012 (ref 1.005–1.030)
pH: 7 (ref 5.0–8.0)

## 2019-08-09 MED ORDER — SODIUM CHLORIDE 0.9 % IV BOLUS
500.0000 mL | Freq: Once | INTRAVENOUS | Status: AC
  Administered 2019-08-09: 500 mL via INTRAVENOUS

## 2019-08-09 NOTE — Discharge Instructions (Addendum)
Continue medications as previously prescribed.  Return to the emergency department if symptoms significantly worsen or change. 

## 2019-08-09 NOTE — ED Provider Notes (Signed)
[name] Provider Note   CSN: [id number] Arrival date & time: 08/09/19  1114     History Chief Complaint  Patient presents with  . Loss of Consciousness    [name] is a 86 y.o. female.  Patient is a 86 year old female with past medical history of pacemaker placement secondary to bradycardia, multiple myeloma, anemia, hypertension, PMR.  She is brought today from her extended care facility for evaluation of "not feeling well" and 3 reported episodes of syncope while she was sitting in a chair.  Patient denies any specific complaints such as chest pain or difficulty breathing.  She reports feeling generally unwell.  She denies any vomiting or diarrhea.  She denies any urinary complaints.  She denies any fevers or chills.  She denies any chest pain or shortness of breath.  The history is provided by the patient.  Loss of Consciousness Episode history:  Multiple Most recent episode:  Today Duration:  30 seconds Progression:  Resolved Chronicity:  New Relieved by:  Nothing Worsened by:  Nothing Ineffective treatments:  None tried      Past Medical History:  Diagnosis Date  . Anemia   . Bradycardia   . Chronic back pain   . Constipation   . Degenerative joint disease   . Diverticulosis   . External hemorrhoids   . GERD (gastroesophageal reflux disease)   . Goiter, unspecified   . Hyperlipidemia   . Hypertension   . Hypothyroidism   . Macular degeneration   . Multiple myeloma ([place]) 03/12/2016  . Osteoporosis   . Pacemaker   . Palpitations   . Polymyalgia rheumatica ([place])   . Seasonal allergies   . Thyrotoxicosis without mention of goiter or other cause, without mention of thyrotoxic crisis or storm     Patient Active Problem List   Diagnosis Date Noted  . Chronic back pain 07/04/2017  . Seasonal allergies 07/04/2017  . Degenerative joint disease 07/04/2017  . Influenza A 07/03/2017  . GERD (gastroesophageal reflux disease)  07/03/2017  . Polymyalgia rheumatica ([place]) 07/03/2017  . Hypokalemia 07/03/2017  . Multiple myeloma ([place]) 03/12/2016  . Thoracic spine fracture ([place]) 06/30/2015  . Anemia 11/22/2014  . Dehydration 11/22/2014  . Fracture, humerus, proximal 11/21/2014  . Fall 11/21/2014  . Closed left hip fracture ([place]) 12/27/2012  . Hyponatremia 12/27/2012  . Dysphagia 10/16/2011  . Constipation 10/16/2011  . Pacemaker 09/03/2011  . Gastroenteritis 07/21/2011  . Hypothyroidism 06/04/2011  . Chest tightness 06/03/2011  . Bradycardia 06/03/2011  . Hypertension 06/03/2011  . Acute renal insufficiency 06/03/2011  . Epigastric abdominal tenderness 06/03/2011  . HYPERLIPIDEMIA, FAMILIAL 01/03/2010  . ANEMIA 01/03/2010  . BRADYARRHYTHMIA 01/03/2010  . PALPITATIONS 01/03/2010    Past Surgical History:  Procedure Laterality Date  . COLONOSCOPY  November 2011   External hemorrhoids, diverticulosis, anal papillae  . COLONOSCOPY N/A 04/01/2014   Procedure: COLONOSCOPY;  Surgeon: Rogene [place], MD;  Location: [facility];  Service: Endoscopy;  Laterality: N/A;  930  . FEMUR IM NAIL Left 12/29/2012   Procedure: INTRAMEDULLARY (IM) NAIL INTERTROCHANTRIC;  Surgeon: Melina Schools, MD;  Location: [facility];  Service: Orthopedics;  Laterality: Left;  . INSERT / REPLACE / REMOVE PACEMAKER    . PERMANENT PACEMAKER INSERTION N/A 06/07/2011   Procedure: PERMANENT PACEMAKER INSERTION;  Surgeon: Evans Lance, MD;  Location: [facility] CATH LAB;  Service: Cardiovascular;  Laterality: N/A;  . THYROIDECTOMY    . TONSILLECTOMY    . vertebroplasty  OB History   No obstetric history on file.     Family History  Problem Relation Age of Onset  . Heart attack Mother        Deceased  . Cancer Father        Deceased, colon cancer age 19  . Cancer Brother        Deceased, throat and lung    Social History   Tobacco Use  . Smoking status: Never Smoker  . Smokeless tobacco: Never Used  Substance Use Topics  .  Alcohol use: No  . Drug use: No    Home Medications Prior to Admission medications   Medication Sig Start Date End Date Taking? Authorizing Provider  acetaminophen (TYLENOL) 500 MG tablet Take 1,000 mg by mouth every 6 (six) hours as needed for mild pain.     [provider]  acyclovir (ZOVIRAX) 400 MG tablet Take 400 mg by mouth 2 (two) times daily.    [provider]  ALPRAZolam Duanne Moron) 0.25 MG tablet Take 0.25 mg by mouth at bedtime.     [provider]  amLODipine (NORVASC) 5 MG tablet Take 5 mg by mouth at bedtime.  08/11/14   [provider]  aspirin 81 MG tablet Take 81 mg by mouth every evening.     [provider]  Calcium Carbonate-Vitamin D (CALCIUM 600+D) 600-400 MG-UNIT tablet Take 1 tablet by mouth 2 (two) times daily.    [provider]  carvedilol (COREG) 6.25 MG tablet Take 1 tablet (6.25 mg total) by mouth 2 (two) times daily with a meal. 07/02/15   Thurnell Lose, MD  cetaphil (CETAPHIL) cream Apply 1 application topically 2 (two) times daily. DAY AND EVENING SHIFT FOR DRY SKIN    [provider]  cycloSPORINE (RESTASIS) 0.05 % ophthalmic emulsion Place 1 drop into both eyes 2 (two) times daily.    [provider]  diphenhydrAMINE (BENADRYL) 25 MG tablet Take 25 mg by mouth every 6 (six) hours as needed for itching or allergies.    [provider]  docusate sodium (COLACE) 100 MG capsule Take 100 mg by mouth 2 (two) times daily. For constipation 01/01/13   Rai, Vernelle Emerald, MD  DULoxetine (CYMBALTA) 20 MG capsule Take 1 capsule by mouth every morning. 09/10/16   [provider]  fexofenadine (ALLEGRA) 180 MG tablet Take 180 mg by mouth every morning.    [provider]  fluticasone (FLONASE) 50 MCG/ACT nasal spray Place 1 spray into both nostrils daily as needed for allergies or rhinitis.    [provider]  hydrALAZINE (APRESOLINE) 25 MG tablet Take 25 mg by mouth 2 (two)  times daily. 06/18/19   [provider]  HYDROcodone-acetaminophen (NORCO/VICODIN) 5-325 MG tablet Take 1 tablet by mouth every 4 (four) hours as needed. 11/19/17   [provider]  hydrocortisone (ANUSOL-HC) 2.5 % rectal cream Place 1 application rectally 2 (two) times daily as needed for hemorrhoids or anal itching.    [provider]  hydrocortisone cream 0.5 % Apply 1 application topically 2 (two) times daily as needed for itching (for rash).    [provider]  Hypertonic Nasal Wash (SINUS RINSE [facility]) Place into the nose daily as needed (for sinusitis).    [provider]  levothyroxine (SYNTHROID, LEVOTHROID) 75 MCG tablet Take 75 mcg by mouth daily before breakfast.  09/25/16   [provider]  loperamide (IMODIUM) 2 MG capsule Take 2 mg by mouth every 4 (four)  hours as needed for diarrhea or loose stools.    [provider]  magnesium hydroxide (MILK OF MAGNESIA) 400 MG/5ML suspension Take by mouth 2 (two) times daily as needed for mild constipation (0.63ms administered as needed).    [provider]  meclizine (ANTIVERT) 25 MG tablet Take 25 mg by mouth every 6 (six) hours as needed for dizziness.    [provider]  meloxicam (MOBIC) 15 MG tablet Take 15 mg by mouth daily. 06/23/19   [provider]  mineral oil liquid Place in ear(s) every Thursday. 2 drops in both ears once daily on Thursdays for Cerumen Impaction    [provider]  multivitamin-lutein (OCUVITE-LUTEIN) CAPS capsule Take 1 capsule by mouth 2 (two) times daily.    [provider]  Olopatadine HCl (PATADAY) 0.2 % SOLN Place 1 drop into both eyes daily.    [provider]  ondansetron (ZOFRAN) 8 MG tablet Take 8 mg by mouth every 8 (eight) hours as needed for nausea or vomiting.    [provider]  oxycodone (OXY-IR) 5 MG capsule Take 5 mg by mouth every 4 (four) hours as needed for pain.    [provider]  pantoprazole (PROTONIX) 40 MG tablet Take 40 mg by mouth daily.    [provider]  polyethylene glycol (MIRALAX / GLYCOLAX) packet Take 17 g by mouth daily.    [provider]  predniSONE (STERAPRED UNI-PAK 48 TAB) 10 MG (48) TBPK tablet Take by mouth daily. 07/15/19   HCarole Civil MD  prochlorperazine (COMPAZINE) 10 MG tablet Take 10 mg by mouth every 6 (six) hours as needed for nausea or vomiting.    [provider]  senna (SENOKOT) 8.6 MG tablet Take 1 tablet by mouth 2 (two) times daily.    [provider]  ZYRTEC ALLERGY 10 MG tablet Take 10 mg by mouth daily. 05/20/19   [provider]    Allergies    Tramadol and Sulfa antibiotics  Review of Systems   Review of Systems  Constitutional: Positive for fatigue.  Cardiovascular: Positive for syncope.  All other systems reviewed and are negative.   Physical Exam Updated Vital Signs BP (!) 156/62 (BP Location: Left Arm)   Pulse 60   Temp 97.6 F (36.4 C) (Oral)   Resp 15   Ht 5' 2"  (1.575 m)   Wt 61.2 kg   SpO2 93%   BMI 24.69 kg/m   Physical Exam Vitals and nursing note reviewed.  Constitutional:      General: She is not in acute distress.    Appearance: She is well-developed. She is not diaphoretic.     Comments: Patient is an elderly female in no acute distress.  She does appear somewhat pale.  HENT:     Head: Normocephalic and atraumatic.     Mouth/Throat:     Mouth: Mucous membranes are moist.  Cardiovascular:     Rate and Rhythm: Normal rate and regular rhythm.     Heart sounds: No murmur. No friction rub. No gallop.   Pulmonary:     Effort: Pulmonary effort is normal. No respiratory distress.     Breath sounds: Normal breath sounds. No wheezing.  Abdominal:     General: Bowel sounds are normal. There is no distension.     Palpations: Abdomen is soft.     Tenderness: There is no abdominal tenderness.  Musculoskeletal:        General: Normal  range of  motion.     Cervical back: Normal range of motion and neck supple.  Skin:    General: Skin is warm and dry.     Coloration: Skin is pale.  Neurological:     Mental Status: She is alert and oriented to person, place, and time.     ED Results / Procedures / Treatments   Labs (all labs ordered are listed, but only abnormal results are displayed) Labs Reviewed  COMPREHENSIVE METABOLIC PANEL  CBC WITH DIFFERENTIAL/PLATELET  URINALYSIS, ROUTINE W REFLEX MICROSCOPIC    EKG EKG Interpretation  Date/Time:  Sunday August 09 2019 11:21:04 EDT Ventricular Rate:  60 PR Interval:    QRS Duration: 93 QT Interval:  432 QTC Calculation: 432 R Axis:   50 Text Interpretation: Sinus rhythm Prolonged PR interval Confirmed by Veryl Speak [phone]) on 08/09/2019 11:32:16 AM   Radiology No results found.  Procedures Procedures (including critical care time)  Medications Ordered in ED Medications  sodium chloride 0.9 % bolus 500 mL (has no administration in time range)    ED Course  I have reviewed the triage vital signs and the nursing notes.  Pertinent labs & imaging results that were available during my care of the patient were reviewed by me and considered in my medical decision making (see chart for details).    MDM Rules/Calculators/A&P  Patient presenting here with complaints of not feeling well and experiencing 3 brief episodes of unresponsiveness syncope.  Patient feeling better shortly after arrival here.  Her vital signs are stable and laboratory studies are unremarkable.  I am not certain what the exact etiology of these episodes were, however she seems fine now.  She has remained on telemetry for several hours and has had no further episodes.  She appears appropriate for discharge at this time.  Care discussed with the patient's son who is present at bedside.  He feels that she is back to baseline and is comfortable with the disposition.  Patient to return as needed  for any problems.  Final Clinical Impression(s) / ED Diagnoses Final diagnoses:  None    Rx / DC Orders ED Discharge Orders    None       Veryl Speak, MD 08/09/19 1349

## 2019-08-09 NOTE — ED Triage Notes (Signed)
Pt brought in by EMS due to syncopal episodes. Pt reports she was sitting in a room with other people and doesn't know what happened. Reported that she had 3 syncopal episodes lasting maybe 30 seconds each. Pt did not fall. Pt was sitting in chair.

## 2019-08-11 ENCOUNTER — Encounter (INDEPENDENT_AMBULATORY_CARE_PROVIDER_SITE_OTHER): Payer: Medicare Other | Admitting: Ophthalmology

## 2019-08-12 DIAGNOSIS — L03116 Cellulitis of left lower limb: Secondary | ICD-10-CM | POA: Diagnosis not present

## 2019-08-12 DIAGNOSIS — M353 Polymyalgia rheumatica: Secondary | ICD-10-CM | POA: Diagnosis not present

## 2019-08-18 DIAGNOSIS — F3341 Major depressive disorder, recurrent, in partial remission: Secondary | ICD-10-CM | POA: Diagnosis not present

## 2019-08-18 DIAGNOSIS — D649 Anemia, unspecified: Secondary | ICD-10-CM | POA: Diagnosis not present

## 2019-08-19 DIAGNOSIS — I10 Essential (primary) hypertension: Secondary | ICD-10-CM | POA: Diagnosis not present

## 2019-08-19 DIAGNOSIS — M353 Polymyalgia rheumatica: Secondary | ICD-10-CM | POA: Diagnosis not present

## 2019-08-19 DIAGNOSIS — Z7952 Long term (current) use of systemic steroids: Secondary | ICD-10-CM | POA: Diagnosis not present

## 2019-08-19 DIAGNOSIS — M546 Pain in thoracic spine: Secondary | ICD-10-CM | POA: Diagnosis not present

## 2019-08-19 DIAGNOSIS — Z66 Do not resuscitate: Secondary | ICD-10-CM | POA: Diagnosis not present

## 2019-08-19 DIAGNOSIS — Z950 Presence of cardiac pacemaker: Secondary | ICD-10-CM | POA: Diagnosis not present

## 2019-08-19 DIAGNOSIS — M1712 Unilateral primary osteoarthritis, left knee: Secondary | ICD-10-CM | POA: Diagnosis not present

## 2019-08-19 DIAGNOSIS — Z9181 History of falling: Secondary | ICD-10-CM | POA: Diagnosis not present

## 2019-08-19 DIAGNOSIS — G8929 Other chronic pain: Secondary | ICD-10-CM | POA: Diagnosis not present

## 2019-08-19 DIAGNOSIS — D649 Anemia, unspecified: Secondary | ICD-10-CM | POA: Diagnosis not present

## 2019-08-19 DIAGNOSIS — C9001 Multiple myeloma in remission: Secondary | ICD-10-CM | POA: Diagnosis not present

## 2019-08-19 DIAGNOSIS — L03119 Cellulitis of unspecified part of limb: Secondary | ICD-10-CM | POA: Diagnosis not present

## 2019-08-19 DIAGNOSIS — E039 Hypothyroidism, unspecified: Secondary | ICD-10-CM | POA: Diagnosis not present

## 2019-08-19 DIAGNOSIS — Z792 Long term (current) use of antibiotics: Secondary | ICD-10-CM | POA: Diagnosis not present

## 2019-08-19 DIAGNOSIS — C9000 Multiple myeloma not having achieved remission: Secondary | ICD-10-CM | POA: Diagnosis not present

## 2019-08-19 DIAGNOSIS — L03116 Cellulitis of left lower limb: Secondary | ICD-10-CM | POA: Diagnosis not present

## 2019-08-19 DIAGNOSIS — Z79891 Long term (current) use of opiate analgesic: Secondary | ICD-10-CM | POA: Diagnosis not present

## 2019-08-19 DIAGNOSIS — Z8781 Personal history of (healed) traumatic fracture: Secondary | ICD-10-CM | POA: Diagnosis not present

## 2019-08-24 DIAGNOSIS — F064 Anxiety disorder due to known physiological condition: Secondary | ICD-10-CM | POA: Diagnosis not present

## 2019-08-24 DIAGNOSIS — F3341 Major depressive disorder, recurrent, in partial remission: Secondary | ICD-10-CM | POA: Diagnosis not present

## 2019-08-24 DIAGNOSIS — F331 Major depressive disorder, recurrent, moderate: Secondary | ICD-10-CM | POA: Diagnosis not present

## 2019-08-25 DIAGNOSIS — B351 Tinea unguium: Secondary | ICD-10-CM | POA: Diagnosis not present

## 2019-08-25 DIAGNOSIS — M79675 Pain in left toe(s): Secondary | ICD-10-CM | POA: Diagnosis not present

## 2019-08-25 DIAGNOSIS — M79674 Pain in right toe(s): Secondary | ICD-10-CM | POA: Diagnosis not present

## 2019-08-26 DIAGNOSIS — F331 Major depressive disorder, recurrent, moderate: Secondary | ICD-10-CM | POA: Diagnosis not present

## 2019-08-26 DIAGNOSIS — M1712 Unilateral primary osteoarthritis, left knee: Secondary | ICD-10-CM | POA: Diagnosis not present

## 2019-08-26 DIAGNOSIS — F064 Anxiety disorder due to known physiological condition: Secondary | ICD-10-CM | POA: Diagnosis not present

## 2019-08-26 DIAGNOSIS — M6281 Muscle weakness (generalized): Secondary | ICD-10-CM | POA: Diagnosis not present

## 2019-08-26 DIAGNOSIS — C9000 Multiple myeloma not having achieved remission: Secondary | ICD-10-CM | POA: Diagnosis not present

## 2019-08-26 DIAGNOSIS — M25552 Pain in left hip: Secondary | ICD-10-CM | POA: Diagnosis not present

## 2019-08-26 DIAGNOSIS — F0391 Unspecified dementia with behavioral disturbance: Secondary | ICD-10-CM | POA: Diagnosis not present

## 2019-08-26 DIAGNOSIS — G4700 Insomnia, unspecified: Secondary | ICD-10-CM | POA: Diagnosis not present

## 2019-08-26 DIAGNOSIS — M353 Polymyalgia rheumatica: Secondary | ICD-10-CM | POA: Diagnosis not present

## 2019-09-09 DIAGNOSIS — E039 Hypothyroidism, unspecified: Secondary | ICD-10-CM | POA: Diagnosis not present

## 2019-09-09 DIAGNOSIS — M25552 Pain in left hip: Secondary | ICD-10-CM | POA: Diagnosis not present

## 2019-09-09 DIAGNOSIS — L03119 Cellulitis of unspecified part of limb: Secondary | ICD-10-CM | POA: Diagnosis not present

## 2019-09-10 ENCOUNTER — Encounter (INDEPENDENT_AMBULATORY_CARE_PROVIDER_SITE_OTHER): Payer: Medicare Other | Admitting: Ophthalmology

## 2019-09-15 DIAGNOSIS — D649 Anemia, unspecified: Secondary | ICD-10-CM | POA: Diagnosis not present

## 2019-09-15 DIAGNOSIS — G894 Chronic pain syndrome: Secondary | ICD-10-CM | POA: Diagnosis not present

## 2019-09-16 DIAGNOSIS — M25552 Pain in left hip: Secondary | ICD-10-CM | POA: Diagnosis not present

## 2019-09-16 DIAGNOSIS — M353 Polymyalgia rheumatica: Secondary | ICD-10-CM | POA: Diagnosis not present

## 2019-09-16 DIAGNOSIS — M6281 Muscle weakness (generalized): Secondary | ICD-10-CM | POA: Diagnosis not present

## 2019-09-17 DIAGNOSIS — G8929 Other chronic pain: Secondary | ICD-10-CM | POA: Diagnosis not present

## 2019-09-17 DIAGNOSIS — M353 Polymyalgia rheumatica: Secondary | ICD-10-CM | POA: Diagnosis not present

## 2019-09-17 DIAGNOSIS — M1712 Unilateral primary osteoarthritis, left knee: Secondary | ICD-10-CM | POA: Diagnosis not present

## 2019-09-17 DIAGNOSIS — M546 Pain in thoracic spine: Secondary | ICD-10-CM | POA: Diagnosis not present

## 2019-09-17 DIAGNOSIS — C9000 Multiple myeloma not having achieved remission: Secondary | ICD-10-CM | POA: Diagnosis not present

## 2019-09-17 DIAGNOSIS — L03116 Cellulitis of left lower limb: Secondary | ICD-10-CM | POA: Diagnosis not present

## 2019-09-18 DIAGNOSIS — Z792 Long term (current) use of antibiotics: Secondary | ICD-10-CM | POA: Diagnosis not present

## 2019-09-18 DIAGNOSIS — M353 Polymyalgia rheumatica: Secondary | ICD-10-CM | POA: Diagnosis not present

## 2019-09-18 DIAGNOSIS — E039 Hypothyroidism, unspecified: Secondary | ICD-10-CM | POA: Diagnosis not present

## 2019-09-18 DIAGNOSIS — I10 Essential (primary) hypertension: Secondary | ICD-10-CM | POA: Diagnosis not present

## 2019-09-18 DIAGNOSIS — Z79891 Long term (current) use of opiate analgesic: Secondary | ICD-10-CM | POA: Diagnosis not present

## 2019-09-18 DIAGNOSIS — Z9181 History of falling: Secondary | ICD-10-CM | POA: Diagnosis not present

## 2019-09-18 DIAGNOSIS — Z7952 Long term (current) use of systemic steroids: Secondary | ICD-10-CM | POA: Diagnosis not present

## 2019-09-18 DIAGNOSIS — M546 Pain in thoracic spine: Secondary | ICD-10-CM | POA: Diagnosis not present

## 2019-09-18 DIAGNOSIS — M1712 Unilateral primary osteoarthritis, left knee: Secondary | ICD-10-CM | POA: Diagnosis not present

## 2019-09-18 DIAGNOSIS — Z66 Do not resuscitate: Secondary | ICD-10-CM | POA: Diagnosis not present

## 2019-09-18 DIAGNOSIS — C9000 Multiple myeloma not having achieved remission: Secondary | ICD-10-CM | POA: Diagnosis not present

## 2019-09-18 DIAGNOSIS — D649 Anemia, unspecified: Secondary | ICD-10-CM | POA: Diagnosis not present

## 2019-09-18 DIAGNOSIS — G8929 Other chronic pain: Secondary | ICD-10-CM | POA: Diagnosis not present

## 2019-09-18 DIAGNOSIS — L03116 Cellulitis of left lower limb: Secondary | ICD-10-CM | POA: Diagnosis not present

## 2019-09-18 DIAGNOSIS — Z8781 Personal history of (healed) traumatic fracture: Secondary | ICD-10-CM | POA: Diagnosis not present

## 2019-09-18 DIAGNOSIS — Z950 Presence of cardiac pacemaker: Secondary | ICD-10-CM | POA: Diagnosis not present

## 2019-09-23 DIAGNOSIS — F331 Major depressive disorder, recurrent, moderate: Secondary | ICD-10-CM | POA: Diagnosis not present

## 2019-09-23 DIAGNOSIS — G4700 Insomnia, unspecified: Secondary | ICD-10-CM | POA: Diagnosis not present

## 2019-09-23 DIAGNOSIS — G8929 Other chronic pain: Secondary | ICD-10-CM | POA: Diagnosis not present

## 2019-09-23 DIAGNOSIS — G894 Chronic pain syndrome: Secondary | ICD-10-CM | POA: Diagnosis not present

## 2019-09-23 DIAGNOSIS — M1712 Unilateral primary osteoarthritis, left knee: Secondary | ICD-10-CM | POA: Diagnosis not present

## 2019-09-23 DIAGNOSIS — C9000 Multiple myeloma not having achieved remission: Secondary | ICD-10-CM | POA: Diagnosis not present

## 2019-09-23 DIAGNOSIS — F064 Anxiety disorder due to known physiological condition: Secondary | ICD-10-CM | POA: Diagnosis not present

## 2019-09-23 DIAGNOSIS — L03116 Cellulitis of left lower limb: Secondary | ICD-10-CM | POA: Diagnosis not present

## 2019-09-23 DIAGNOSIS — M546 Pain in thoracic spine: Secondary | ICD-10-CM | POA: Diagnosis not present

## 2019-09-23 DIAGNOSIS — M353 Polymyalgia rheumatica: Secondary | ICD-10-CM | POA: Diagnosis not present

## 2019-09-25 DIAGNOSIS — M1712 Unilateral primary osteoarthritis, left knee: Secondary | ICD-10-CM | POA: Diagnosis not present

## 2019-09-25 DIAGNOSIS — G8929 Other chronic pain: Secondary | ICD-10-CM | POA: Diagnosis not present

## 2019-09-25 DIAGNOSIS — M353 Polymyalgia rheumatica: Secondary | ICD-10-CM | POA: Diagnosis not present

## 2019-09-25 DIAGNOSIS — C9000 Multiple myeloma not having achieved remission: Secondary | ICD-10-CM | POA: Diagnosis not present

## 2019-09-25 DIAGNOSIS — M546 Pain in thoracic spine: Secondary | ICD-10-CM | POA: Diagnosis not present

## 2019-09-25 DIAGNOSIS — L03116 Cellulitis of left lower limb: Secondary | ICD-10-CM | POA: Diagnosis not present

## 2019-09-30 DIAGNOSIS — G8929 Other chronic pain: Secondary | ICD-10-CM | POA: Diagnosis not present

## 2019-09-30 DIAGNOSIS — L03116 Cellulitis of left lower limb: Secondary | ICD-10-CM | POA: Diagnosis not present

## 2019-09-30 DIAGNOSIS — C9000 Multiple myeloma not having achieved remission: Secondary | ICD-10-CM | POA: Diagnosis not present

## 2019-09-30 DIAGNOSIS — M1712 Unilateral primary osteoarthritis, left knee: Secondary | ICD-10-CM | POA: Diagnosis not present

## 2019-09-30 DIAGNOSIS — M353 Polymyalgia rheumatica: Secondary | ICD-10-CM | POA: Diagnosis not present

## 2019-09-30 DIAGNOSIS — M546 Pain in thoracic spine: Secondary | ICD-10-CM | POA: Diagnosis not present

## 2019-10-02 DIAGNOSIS — M1712 Unilateral primary osteoarthritis, left knee: Secondary | ICD-10-CM | POA: Diagnosis not present

## 2019-10-02 DIAGNOSIS — M353 Polymyalgia rheumatica: Secondary | ICD-10-CM | POA: Diagnosis not present

## 2019-10-02 DIAGNOSIS — G8929 Other chronic pain: Secondary | ICD-10-CM | POA: Diagnosis not present

## 2019-10-02 DIAGNOSIS — C9000 Multiple myeloma not having achieved remission: Secondary | ICD-10-CM | POA: Diagnosis not present

## 2019-10-02 DIAGNOSIS — L03116 Cellulitis of left lower limb: Secondary | ICD-10-CM | POA: Diagnosis not present

## 2019-10-02 DIAGNOSIS — M546 Pain in thoracic spine: Secondary | ICD-10-CM | POA: Diagnosis not present

## 2019-10-06 DIAGNOSIS — M1712 Unilateral primary osteoarthritis, left knee: Secondary | ICD-10-CM | POA: Diagnosis not present

## 2019-10-06 DIAGNOSIS — C9000 Multiple myeloma not having achieved remission: Secondary | ICD-10-CM | POA: Diagnosis not present

## 2019-10-06 DIAGNOSIS — M546 Pain in thoracic spine: Secondary | ICD-10-CM | POA: Diagnosis not present

## 2019-10-06 DIAGNOSIS — L03116 Cellulitis of left lower limb: Secondary | ICD-10-CM | POA: Diagnosis not present

## 2019-10-06 DIAGNOSIS — M353 Polymyalgia rheumatica: Secondary | ICD-10-CM | POA: Diagnosis not present

## 2019-10-06 DIAGNOSIS — G8929 Other chronic pain: Secondary | ICD-10-CM | POA: Diagnosis not present

## 2019-10-07 DIAGNOSIS — H814 Vertigo of central origin: Secondary | ICD-10-CM | POA: Diagnosis not present

## 2019-10-07 DIAGNOSIS — J309 Allergic rhinitis, unspecified: Secondary | ICD-10-CM | POA: Diagnosis not present

## 2019-10-07 DIAGNOSIS — C9001 Multiple myeloma in remission: Secondary | ICD-10-CM | POA: Diagnosis not present

## 2019-10-08 DIAGNOSIS — G8929 Other chronic pain: Secondary | ICD-10-CM | POA: Diagnosis not present

## 2019-10-08 DIAGNOSIS — C9000 Multiple myeloma not having achieved remission: Secondary | ICD-10-CM | POA: Diagnosis not present

## 2019-10-08 DIAGNOSIS — M353 Polymyalgia rheumatica: Secondary | ICD-10-CM | POA: Diagnosis not present

## 2019-10-08 DIAGNOSIS — M546 Pain in thoracic spine: Secondary | ICD-10-CM | POA: Diagnosis not present

## 2019-10-08 DIAGNOSIS — L03116 Cellulitis of left lower limb: Secondary | ICD-10-CM | POA: Diagnosis not present

## 2019-10-08 DIAGNOSIS — M1712 Unilateral primary osteoarthritis, left knee: Secondary | ICD-10-CM | POA: Diagnosis not present

## 2019-10-13 DIAGNOSIS — M353 Polymyalgia rheumatica: Secondary | ICD-10-CM | POA: Diagnosis not present

## 2019-10-13 DIAGNOSIS — L03116 Cellulitis of left lower limb: Secondary | ICD-10-CM | POA: Diagnosis not present

## 2019-10-13 DIAGNOSIS — G8929 Other chronic pain: Secondary | ICD-10-CM | POA: Diagnosis not present

## 2019-10-13 DIAGNOSIS — M546 Pain in thoracic spine: Secondary | ICD-10-CM | POA: Diagnosis not present

## 2019-10-13 DIAGNOSIS — C9000 Multiple myeloma not having achieved remission: Secondary | ICD-10-CM | POA: Diagnosis not present

## 2019-10-13 DIAGNOSIS — M1712 Unilateral primary osteoarthritis, left knee: Secondary | ICD-10-CM | POA: Diagnosis not present

## 2019-10-15 DIAGNOSIS — M546 Pain in thoracic spine: Secondary | ICD-10-CM | POA: Diagnosis not present

## 2019-10-15 DIAGNOSIS — M1712 Unilateral primary osteoarthritis, left knee: Secondary | ICD-10-CM | POA: Diagnosis not present

## 2019-10-15 DIAGNOSIS — C9000 Multiple myeloma not having achieved remission: Secondary | ICD-10-CM | POA: Diagnosis not present

## 2019-10-15 DIAGNOSIS — M353 Polymyalgia rheumatica: Secondary | ICD-10-CM | POA: Diagnosis not present

## 2019-10-15 DIAGNOSIS — L03116 Cellulitis of left lower limb: Secondary | ICD-10-CM | POA: Diagnosis not present

## 2019-10-15 DIAGNOSIS — G8929 Other chronic pain: Secondary | ICD-10-CM | POA: Diagnosis not present

## 2019-10-29 LAB — CUP PACEART REMOTE DEVICE CHECK
Battery Remaining Longevity: 52 mo
Battery Remaining Percentage: 46 %
Battery Voltage: 2.86 V
Brady Statistic AP VP Percent: 1 %
Brady Statistic AP VS Percent: 49 %
Brady Statistic AS VP Percent: 1 %
Brady Statistic AS VS Percent: 51 %
Brady Statistic RA Percent Paced: 41 %
Brady Statistic RV Percent Paced: 1 %
Date Time Interrogation Session: 20210708101959
Implantable Lead Implant Date: 20130214
Implantable Lead Implant Date: 20130214
Implantable Lead Location: 753859
Implantable Lead Location: 753860
Implantable Pulse Generator Implant Date: 20130214
Lead Channel Impedance Value: 380 Ohm
Lead Channel Impedance Value: 410 Ohm
Lead Channel Pacing Threshold Amplitude: 0.75 V
Lead Channel Pacing Threshold Amplitude: 1.25 V
Lead Channel Pacing Threshold Pulse Width: 0.5 ms
Lead Channel Pacing Threshold Pulse Width: 0.5 ms
Lead Channel Sensing Intrinsic Amplitude: 12 mV
Lead Channel Sensing Intrinsic Amplitude: 3.6 mV
Lead Channel Setting Pacing Amplitude: 2 V
Lead Channel Setting Pacing Amplitude: 2.5 V
Lead Channel Setting Pacing Pulse Width: 0.5 ms
Lead Channel Setting Sensing Sensitivity: 2 mV
Pulse Gen Model: 2210
Pulse Gen Serial Number: 7316836

## 2019-10-30 ENCOUNTER — Ambulatory Visit (INDEPENDENT_AMBULATORY_CARE_PROVIDER_SITE_OTHER): Payer: Medicare Other | Admitting: *Deleted

## 2019-10-30 DIAGNOSIS — Z950 Presence of cardiac pacemaker: Secondary | ICD-10-CM

## 2019-11-02 DIAGNOSIS — C9000 Multiple myeloma not having achieved remission: Secondary | ICD-10-CM | POA: Diagnosis not present

## 2019-11-02 DIAGNOSIS — M1712 Unilateral primary osteoarthritis, left knee: Secondary | ICD-10-CM | POA: Diagnosis not present

## 2019-11-02 DIAGNOSIS — M353 Polymyalgia rheumatica: Secondary | ICD-10-CM | POA: Diagnosis not present

## 2019-11-03 DIAGNOSIS — M79675 Pain in left toe(s): Secondary | ICD-10-CM | POA: Diagnosis not present

## 2019-11-03 DIAGNOSIS — B351 Tinea unguium: Secondary | ICD-10-CM | POA: Diagnosis not present

## 2019-11-03 DIAGNOSIS — M79674 Pain in right toe(s): Secondary | ICD-10-CM | POA: Diagnosis not present

## 2019-11-03 NOTE — Progress Notes (Signed)
Remote pacemaker transmission.   

## 2019-11-05 ENCOUNTER — Other Ambulatory Visit: Payer: Self-pay

## 2019-11-05 ENCOUNTER — Ambulatory Visit (INDEPENDENT_AMBULATORY_CARE_PROVIDER_SITE_OTHER): Payer: Medicare Other | Admitting: Ophthalmology

## 2019-11-05 ENCOUNTER — Encounter (INDEPENDENT_AMBULATORY_CARE_PROVIDER_SITE_OTHER): Payer: Self-pay | Admitting: Ophthalmology

## 2019-11-05 DIAGNOSIS — H353134 Nonexudative age-related macular degeneration, bilateral, advanced atrophic with subfoveal involvement: Secondary | ICD-10-CM

## 2019-11-05 DIAGNOSIS — H26492 Other secondary cataract, left eye: Secondary | ICD-10-CM | POA: Insufficient documentation

## 2019-11-05 DIAGNOSIS — H353221 Exudative age-related macular degeneration, left eye, with active choroidal neovascularization: Secondary | ICD-10-CM

## 2019-11-05 DIAGNOSIS — H353222 Exudative age-related macular degeneration, left eye, with inactive choroidal neovascularization: Secondary | ICD-10-CM | POA: Insufficient documentation

## 2019-11-05 NOTE — Assessment & Plan Note (Signed)
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

## 2019-11-05 NOTE — Assessment & Plan Note (Signed)
Posterior capsule opacification is present but not visually significant. I explained to the patient that it is like ice or dirt building up on the windshield of your car clouding your vision. You do not have to stop and clean it until it hinders your vision. I explained, that when needed , a procedure called laser YAG  capsulotomy can be done to improve vision.  Posterior capsule OS does appear to be intact but most prominently is a retained lens fragment likely which has grown over the decade since her cataract extraction OS, a effectively summering's ring fragment which is now the visual axis hampering this patient's visual functioning.  I will schedule patient to return to [facility] to see Dr. Bing Plume or Dr. Gala Romney for consideration of YAG laser photo disruption/capsulotomy left eye

## 2019-11-05 NOTE — Progress Notes (Signed)
11/05/2019     CHIEF COMPLAINT Patient presents for Retina Evaluation   HISTORY OF PRESENT ILLNESS: [name] is a 86 y.o. female who presents to the clinic today for:   HPI    Retina Evaluation    In both eyes.  This started 1 week ago.  Duration of 1 week.  Associated Symptoms Floaters.  Negative for Flashes and Pain.  Context:  distance vision, near vision, watching TV and reading.          Comments    Decreased vision OU. Pt states she woke up Saturday morning and had a hard time seeing near and far vision. Pt states she has been having a lot of floaters OS and patient denies any flashes of light.        Last edited by Melburn Popper, COA on 11/05/2019  3:30 PM. (History)      Referring physician: Celene Squibb, MD [place],  [place] [id number]  HISTORICAL INFORMATION:   Selected notes from the MEDICAL RECORD NUMBER       CURRENT MEDICATIONS: Current Outpatient Medications (Ophthalmic Drugs)  Medication Sig  . cycloSPORINE (RESTASIS) 0.05 % ophthalmic emulsion Place 1 drop into both eyes 2 (two) times daily.  . Olopatadine HCl (PATADAY) 0.2 % SOLN Place 1 drop into both eyes daily.   No current facility-administered medications for this visit. (Ophthalmic Drugs)   Current Outpatient Medications (Other)  Medication Sig  . acetaminophen (TYLENOL) 500 MG tablet Take 1,000 mg by mouth every 6 (six) hours as needed for mild pain.   Marland Kitchen acyclovir (ZOVIRAX) 400 MG tablet Take 400 mg by mouth 2 (two) times daily.  Marland Kitchen ALPRAZolam (XANAX) 0.25 MG tablet Take 0.25 mg by mouth at bedtime.   Marland Kitchen amLODipine (NORVASC) 5 MG tablet Take 5 mg by mouth at bedtime.   Marland Kitchen aspirin 81 MG tablet Take 81 mg by mouth every evening.   . Calcium Carbonate-Vitamin D (CALCIUM 600+D) 600-400 MG-UNIT tablet Take 1 tablet by mouth 2 (two) times daily.  . carvedilol (COREG) 6.25 MG tablet Take 1 tablet (6.25 mg total) by mouth 2 (two) times daily with a meal.  . cetaphil (CETAPHIL)  cream Apply 1 application topically 2 (two) times daily. DAY AND EVENING SHIFT FOR DRY SKIN  . diphenhydrAMINE (BENADRYL) 25 MG tablet Take 25 mg by mouth every 6 (six) hours as needed for itching or allergies.  Marland Kitchen docusate sodium (COLACE) 100 MG capsule Take 100 mg by mouth 2 (two) times daily. For constipation  . DULoxetine (CYMBALTA) 20 MG capsule Take 1 capsule by mouth every morning.  . fexofenadine (ALLEGRA) 180 MG tablet Take 180 mg by mouth every morning.  . fluticasone (FLONASE) 50 MCG/ACT nasal spray Place 1 spray into both nostrils daily as needed for allergies or rhinitis.  . hydrALAZINE (APRESOLINE) 25 MG tablet Take 25 mg by mouth 2 (two) times daily.  Marland Kitchen HYDROcodone-acetaminophen (NORCO/VICODIN) 5-325 MG tablet Take 1 tablet by mouth every 4 (four) hours as needed.  . hydrocortisone (ANUSOL-HC) 2.5 % rectal cream Place 1 application rectally 2 (two) times daily as needed for hemorrhoids or anal itching.  . hydrocortisone cream 0.5 % Apply 1 application topically 2 (two) times daily as needed for itching (for rash).  . Hypertonic Nasal Wash (SINUS RINSE NA) Place into the nose daily as needed (for sinusitis).  Marland Kitchen levothyroxine (SYNTHROID, LEVOTHROID) 75 MCG tablet Take 75 mcg by mouth daily before breakfast.   .  loperamide (IMODIUM) 2 MG capsule Take 2 mg by mouth every 4 (four) hours as needed for diarrhea or loose stools.  . magnesium hydroxide (MILK OF MAGNESIA) 400 MG/5ML suspension Take by mouth 2 (two) times daily as needed for mild constipation (0.65ms administered as needed).  . meclizine (ANTIVERT) 25 MG tablet Take 25 mg by mouth every 6 (six) hours as needed for dizziness.  . meloxicam (MOBIC) 15 MG tablet Take 15 mg by mouth daily.  . mineral oil liquid Place in ear(s) every Thursday. 2 drops in both ears once daily on Thursdays for Cerumen Impaction  . multivitamin-lutein (OCUVITE-LUTEIN) CAPS capsule Take 1 capsule by mouth 2 (two) times daily.  . ondansetron (ZOFRAN) 8 MG  tablet Take 8 mg by mouth every 8 (eight) hours as needed for nausea or vomiting.  .Marland Kitchenoxycodone (OXY-IR) 5 MG capsule Take 5 mg by mouth every 4 (four) hours as needed for pain.  . pantoprazole (PROTONIX) 40 MG tablet Take 40 mg by mouth daily.  . polyethylene glycol (MIRALAX / GLYCOLAX) packet Take 17 g by mouth daily.  . predniSONE (DELTASONE) 5 MG tablet Take 5 mg by mouth daily.  . prochlorperazine (COMPAZINE) 10 MG tablet Take 10 mg by mouth every 6 (six) hours as needed for nausea or vomiting.  . senna (SENOKOT) 8.6 MG tablet Take 1 tablet by mouth 2 (two) times daily.  .Marland KitchenZYRTEC ALLERGY 10 MG tablet Take 10 mg by mouth daily.   No current facility-administered medications for this visit. (Other)      REVIEW OF SYSTEMS:    ALLERGIES Allergies  Allergen Reactions  . Tramadol Itching and Nausea Only  . Sulfa Antibiotics Nausea And Vomiting    PAST MEDICAL HISTORY Past Medical History:  Diagnosis Date  . Anemia   . Bradycardia   . Chronic back pain   . Constipation   . Degenerative joint disease   . Diverticulosis   . External hemorrhoids   . GERD (gastroesophageal reflux disease)   . Goiter, unspecified   . Hyperlipidemia   . Hypertension   . Hypothyroidism   . Macular degeneration   . [place]  . Osteoporosis   . Pacemaker   . Palpitations   . [place] ([place]   . Seasonal allergies   . Thyrotoxicosis without mention of goiter or other cause, without mention of thyrotoxic crisis or storm    Past Surgical History:  Procedure Laterality Date  . COLONOSCOPY  November 2011   External hemorrhoids, diverticulosis, anal papillae  . COLONOSCOPY N/A 04/01/2014   Procedure: COLONOSCOPY;  Surgeon: NRogene Houston MD;  [facility];  Service: Endoscopy;  Laterality: N/A;  930  . FEMUR IM NAIL Left 12/29/2012   Procedure: INTRAMEDULLARY (IM) NAIL INTERTROCHANTRIC;  Surgeon: DMelina Schools MD;  [facility]  Service:  Orthopedics;  Laterality: Left;  . INSERT / REPLACE / REMOVE PACEMAKER    . PERMANENT PACEMAKER INSERTION N/A 06/07/2011   Procedure: PERMANENT PACEMAKER INSERTION;  Surgeon: GEvans Lance MD;  [facility];  Service: Cardiovascular;  Laterality: N/A;  . THYROIDECTOMY    . TONSILLECTOMY    . vertebroplasty      FAMILY HISTORY Family History  Problem Relation Age of Onset  . Heart attack Mother        Deceased  . Cancer Father        Deceased, colon cancer age 84 . Cancer Brother  Deceased, throat and lung    SOCIAL HISTORY Social History   Tobacco Use  . Smoking status: Never Smoker  . Smokeless tobacco: Never Used  Vaping Use  . Vaping Use: Never used  Substance Use Topics  . Alcohol use: No  . Drug use: No         OPHTHALMIC EXAM:  Base Eye Exam    Visual Acuity (ETDRS)      Right Left   Dist [place] CF at 1' 20/200   Dist ph [place] NI NI       Tonometry (Tonopen, 3:36 PM)      Right Left   Pressure 15 17       Pupils      Pupils Dark Light Shape React APD   Right PERRL 5 4 Round Slow None   Left PERRL 5 4 Round Slow None       Visual Fields      Left Right   Restrictions Total superior temporal, inferior temporal deficiencies Total superior nasal deficiency       Extraocular Movement      Right Left    Full Full       Neuro/Psych    Oriented x3: Yes   Mood/Affect: Normal       Dilation    Both eyes: 1.0% Mydriacyl, 2.5% Phenylephrine @ 3:36 PM        Slit Lamp and Fundus Exam    External Exam      Right Left   External Normal Normal       Slit Lamp Exam      Right Left   Lids/Lashes Normal Normal   Conjunctiva/Sclera White and quiet White and quiet   Cornea Clear Clear   Anterior Chamber Deep and quiet Deep and quiet   Iris Round and reactive Round and reactive   Lens Posterior chamber intraocular lens Posterior chamber intraocular lens, appears centered, yet with large partial Sommering's ring fragment,  Now in the  visual axis from temporal portion of bag, 4+ Posterior capsular opacification   Anterior Vitreous Normal Normal       Fundus Exam      Right Left   Posterior Vitreous Normal Normal   Disc Normal Normal   C/D Ratio 0.1 0.15   Macula Exudates, Disciform scar massive Disciform scar   Vessels Normal Normal   Periphery Normal Normal          IMAGING AND PROCEDURES  Imaging and Procedures for 11/05/19  OCT, Retina - OU - Both Eyes       Right Eye Quality was good. Scan locations included subfoveal. Findings include abnormal foveal contour, choroidal neovascular membrane, cystoid macular edema, subretinal scarring, disciform scar.   Left Eye Quality was good. Scan locations included subfoveal. Central Foveal Thickness: 397. Findings include abnormal foveal contour, disciform scar, subretinal scarring, epiretinal membrane.   Notes Massive subretinal disciform scar OD, stable over the last 10 years.  No change overall.  The left eye similarly with prior disciform macular scar coincident with Epiretinal membrane as well as pigment epithelial detachment.  Subfoveal scarring of the vision in the left eye.  Medial opacity is severe.                ASSESSMENT/PLAN:  PCO (posterior capsular opacification), left Posterior capsule opacification is present but not visually significant. I explained to the patient that it is like ice or dirt building up on the windshield of your car clouding your vision. You  do not have to stop and clean it until it hinders your vision. I explained, that when needed , a procedure called laser YAG  capsulotomy can be done to improve vision.  Posterior capsule OS does appear to be intact but most prominently is a retained lens fragment likely which has grown over the decade since her cataract extraction OS, a effectively summering's ring fragment which is now the visual axis hampering this patient's visual functioning.  I will schedule patient to return  to [facility] to see Dr. Bing Plume or Dr. Gala Romney for consideration of YAG laser photo disruption/capsulotomy left eye  Advanced nonexudative age-related macular degeneration of both eyes with subfoveal involvement Overall no change in disease activity OU, no indication for treatment of wet AMD at this time OU.      ICD-10-CM   1. Exudative age-related macular degeneration of left eye with active choroidal neovascularization (HCC)  H35.3221 OCT, Retina - OU - Both Eyes  2. Advanced nonexudative age-related macular degeneration of both eyes with subfoveal involvement  H35.3134 OCT, Retina - OU - Both Eyes  3. PCO (posterior capsular opacification), left  H26.492     1.Posterior capsule OS does appear to be intact but most prominently is a retained lens fragment likely which has grown over the decade since her cataract extraction OS, a effectively summering's ring fragment which is now the visual axis hampering this patient's visual functioning.  I will schedule patient to return to [facility] to see Dr. Bing Plume or Dr. Gala Romney for consideration of YAG laser photo disruption/capsulotomy left eye  2.  3.  Ophthalmic Meds Ordered this visit:  No orders of the defined types were placed in this encounter.      Return in about 6 months (around 05/07/2020), or ,, Schedule appointment soon with Dr. Calvert Cantor or Dr. Gala Romney, for Follow-up here as needed or 6 months, DILATE OU.  There are no Patient Instructions on file for this visit.   Explained the diagnoses, plan, and follow up with the patient and they expressed understanding.  Patient expressed understanding of the importance of proper follow up care.   Clent Demark Erick Oxendine M.D. Diseases & Surgery of the Retina and Vitreous Retina & Diabetic [place] 11/05/19     Abbreviations: M myopia (nearsighted); A astigmatism; H hyperopia (farsighted); P presbyopia; Mrx spectacle prescription;  CTL contact lenses;  OD right eye; OS left eye; OU both eyes  XT exotropia; ET esotropia; PEK punctate epithelial keratitis; PEE punctate epithelial erosions; DES dry eye syndrome; MGD meibomian gland dysfunction; ATs artificial tears; PFAT's preservative free artificial tears; [place] nuclear sclerotic cataract; PSC posterior subcapsular cataract; ERM epi-retinal membrane; PVD posterior vitreous detachment; RD retinal detachment; DM diabetes mellitus; DR diabetic retinopathy; NPDR non-proliferative diabetic retinopathy; PDR proliferative diabetic retinopathy; CSME clinically significant macular edema; DME diabetic macular edema; dbh dot blot hemorrhages; CWS cotton wool spot; POAG primary open angle glaucoma; C/D cup-to-disc ratio; HVF humphrey visual field; GVF goldmann visual field; OCT optical coherence tomography; IOP intraocular pressure; BRVO Branch retinal vein occlusion; CRVO central retinal vein occlusion; CRAO central retinal artery occlusion; BRAO branch retinal artery occlusion; RT retinal tear; SB scleral buckle; PPV pars plana vitrectomy; VH Vitreous hemorrhage; PRP panretinal laser photocoagulation; IVK intravitreal kenalog; VMT vitreomacular traction; MH Macular hole;  NVD neovascularization of the disc; NVE neovascularization elsewhere; AREDS age related eye disease study; ARMD age related macular degeneration; POAG primary open angle glaucoma; EBMD epithelial/anterior basement membrane dystrophy; ACIOL anterior  chamber intraocular lens; IOL intraocular lens; PCIOL posterior chamber intraocular lens; Phaco/IOL phacoemulsification with intraocular lens placement; [place] photorefractive keratectomy; LASIK laser assisted in situ keratomileusis; HTN hypertension; DM diabetes mellitus; COPD chronic obstructive pulmonary disease

## 2019-11-09 DIAGNOSIS — R944 Abnormal results of kidney function studies: Secondary | ICD-10-CM | POA: Diagnosis not present

## 2019-11-09 DIAGNOSIS — Z8619 Personal history of other infectious and parasitic diseases: Secondary | ICD-10-CM | POA: Diagnosis not present

## 2019-11-09 DIAGNOSIS — Z79891 Long term (current) use of opiate analgesic: Secondary | ICD-10-CM | POA: Diagnosis not present

## 2019-11-09 DIAGNOSIS — Z79899 Other long term (current) drug therapy: Secondary | ICD-10-CM | POA: Diagnosis not present

## 2019-11-09 DIAGNOSIS — R52 Pain, unspecified: Secondary | ICD-10-CM | POA: Diagnosis not present

## 2019-11-09 DIAGNOSIS — D649 Anemia, unspecified: Secondary | ICD-10-CM | POA: Diagnosis not present

## 2019-11-09 DIAGNOSIS — R5383 Other fatigue: Secondary | ICD-10-CM | POA: Diagnosis not present

## 2019-11-09 DIAGNOSIS — C9000 Multiple myeloma not having achieved remission: Secondary | ICD-10-CM | POA: Diagnosis not present

## 2019-11-09 DIAGNOSIS — R627 Adult failure to thrive: Secondary | ICD-10-CM | POA: Diagnosis not present

## 2019-11-09 DIAGNOSIS — E871 Hypo-osmolality and hyponatremia: Secondary | ICD-10-CM | POA: Diagnosis not present

## 2019-11-11 DIAGNOSIS — G894 Chronic pain syndrome: Secondary | ICD-10-CM | POA: Diagnosis not present

## 2019-11-16 ENCOUNTER — Other Ambulatory Visit (HOSPITAL_COMMUNITY)
Admission: AD | Admit: 2019-11-16 | Discharge: 2019-11-16 | Disposition: A | Discharge status: Home / Self Care | Payer: Medicare Other | Source: Skilled Nursing Facility | Attending: Family Medicine | Admitting: Family Medicine

## 2019-11-16 DIAGNOSIS — N390 Urinary tract infection, site not specified: Secondary | ICD-10-CM | POA: Insufficient documentation

## 2019-11-16 LAB — URINALYSIS, ROUTINE W REFLEX MICROSCOPIC
Bacteria, UA: NONE SEEN
Bilirubin Urine: NEGATIVE
Glucose, UA: NEGATIVE mg/dL
Hgb urine dipstick: NEGATIVE
Ketones, ur: NEGATIVE mg/dL
Leukocytes,Ua: NEGATIVE
Nitrite: NEGATIVE
Protein, ur: 30 mg/dL — AB
Specific Gravity, Urine: 1.009 (ref 1.005–1.030)
pH: 6 (ref 5.0–8.0)

## 2019-11-18 DIAGNOSIS — H43811 Vitreous degeneration, right eye: Secondary | ICD-10-CM | POA: Diagnosis not present

## 2019-11-18 DIAGNOSIS — Z961 Presence of intraocular lens: Secondary | ICD-10-CM | POA: Diagnosis not present

## 2019-11-18 DIAGNOSIS — H26492 Other secondary cataract, left eye: Secondary | ICD-10-CM | POA: Diagnosis not present

## 2019-11-18 DIAGNOSIS — H353211 Exudative age-related macular degeneration, right eye, with active choroidal neovascularization: Secondary | ICD-10-CM | POA: Diagnosis not present

## 2019-11-18 LAB — URINE CULTURE

## 2019-11-20 DIAGNOSIS — M25562 Pain in left knee: Secondary | ICD-10-CM | POA: Diagnosis not present

## 2019-11-20 DIAGNOSIS — M1712 Unilateral primary osteoarthritis, left knee: Secondary | ICD-10-CM | POA: Diagnosis not present

## 2019-11-25 DIAGNOSIS — F331 Major depressive disorder, recurrent, moderate: Secondary | ICD-10-CM | POA: Diagnosis not present

## 2019-11-25 DIAGNOSIS — G894 Chronic pain syndrome: Secondary | ICD-10-CM | POA: Diagnosis not present

## 2019-11-25 DIAGNOSIS — G4700 Insomnia, unspecified: Secondary | ICD-10-CM | POA: Diagnosis not present

## 2019-11-25 DIAGNOSIS — F064 Anxiety disorder due to known physiological condition: Secondary | ICD-10-CM | POA: Diagnosis not present

## 2019-11-30 DIAGNOSIS — Z961 Presence of intraocular lens: Secondary | ICD-10-CM | POA: Diagnosis not present

## 2019-11-30 DIAGNOSIS — H353222 Exudative age-related macular degeneration, left eye, with inactive choroidal neovascularization: Secondary | ICD-10-CM | POA: Diagnosis not present

## 2019-11-30 DIAGNOSIS — H26492 Other secondary cataract, left eye: Secondary | ICD-10-CM | POA: Diagnosis not present

## 2019-11-30 DIAGNOSIS — H43811 Vitreous degeneration, right eye: Secondary | ICD-10-CM | POA: Diagnosis not present

## 2019-11-30 DIAGNOSIS — H353211 Exudative age-related macular degeneration, right eye, with active choroidal neovascularization: Secondary | ICD-10-CM | POA: Diagnosis not present

## 2019-11-30 DIAGNOSIS — H18591 Other hereditary corneal dystrophies, right eye: Secondary | ICD-10-CM | POA: Diagnosis not present

## 2019-12-02 DIAGNOSIS — K5900 Constipation, unspecified: Secondary | ICD-10-CM | POA: Diagnosis not present

## 2019-12-02 DIAGNOSIS — H814 Vertigo of central origin: Secondary | ICD-10-CM | POA: Diagnosis not present

## 2019-12-02 DIAGNOSIS — C9001 Multiple myeloma in remission: Secondary | ICD-10-CM | POA: Diagnosis not present

## 2019-12-02 DIAGNOSIS — J309 Allergic rhinitis, unspecified: Secondary | ICD-10-CM | POA: Diagnosis not present

## 2019-12-09 DIAGNOSIS — M353 Polymyalgia rheumatica: Secondary | ICD-10-CM | POA: Diagnosis not present

## 2019-12-09 DIAGNOSIS — G894 Chronic pain syndrome: Secondary | ICD-10-CM | POA: Diagnosis not present

## 2019-12-21 DIAGNOSIS — G8929 Other chronic pain: Secondary | ICD-10-CM | POA: Diagnosis not present

## 2019-12-21 DIAGNOSIS — E871 Hypo-osmolality and hyponatremia: Secondary | ICD-10-CM | POA: Diagnosis not present

## 2019-12-21 DIAGNOSIS — R5383 Other fatigue: Secondary | ICD-10-CM | POA: Diagnosis not present

## 2019-12-21 DIAGNOSIS — D649 Anemia, unspecified: Secondary | ICD-10-CM | POA: Diagnosis not present

## 2019-12-21 DIAGNOSIS — Z79899 Other long term (current) drug therapy: Secondary | ICD-10-CM | POA: Diagnosis not present

## 2019-12-21 DIAGNOSIS — C9000 Multiple myeloma not having achieved remission: Secondary | ICD-10-CM | POA: Diagnosis not present

## 2019-12-25 DIAGNOSIS — Z23 Encounter for immunization: Secondary | ICD-10-CM | POA: Diagnosis not present

## 2020-01-01 DIAGNOSIS — H6123 Impacted cerumen, bilateral: Secondary | ICD-10-CM | POA: Diagnosis not present

## 2020-01-01 DIAGNOSIS — Z6827 Body mass index (BMI) 27.0-27.9, adult: Secondary | ICD-10-CM | POA: Diagnosis not present

## 2020-01-11 DIAGNOSIS — F331 Major depressive disorder, recurrent, moderate: Secondary | ICD-10-CM | POA: Diagnosis not present

## 2020-01-13 ENCOUNTER — Other Ambulatory Visit: Payer: Self-pay

## 2020-01-13 ENCOUNTER — Encounter: Payer: Self-pay | Admitting: Internal Medicine

## 2020-01-13 ENCOUNTER — Ambulatory Visit (INDEPENDENT_AMBULATORY_CARE_PROVIDER_SITE_OTHER): Payer: Medicare Other | Admitting: Internal Medicine

## 2020-01-13 VITALS — BP 140/60 | HR 72 | Ht 62.0 in | Wt 142.0 lb

## 2020-01-13 DIAGNOSIS — R001 Bradycardia, unspecified: Secondary | ICD-10-CM

## 2020-01-13 DIAGNOSIS — I10 Essential (primary) hypertension: Secondary | ICD-10-CM | POA: Diagnosis not present

## 2020-01-13 DIAGNOSIS — E039 Hypothyroidism, unspecified: Secondary | ICD-10-CM | POA: Diagnosis not present

## 2020-01-13 DIAGNOSIS — Z950 Presence of cardiac pacemaker: Secondary | ICD-10-CM | POA: Diagnosis not present

## 2020-01-13 DIAGNOSIS — D509 Iron deficiency anemia, unspecified: Secondary | ICD-10-CM | POA: Diagnosis not present

## 2020-01-13 NOTE — Patient Instructions (Addendum)
Medication Instructions:  Your physician has recommended you make the following change in your medication:   TAKE an extra hydralazine for systolic blood pressure (top number) greater than 160  Labwork: None ordered.  Testing/Procedures: None ordered.  Follow-Up: Your physician wants you to follow-up in: one year with Dr. Lovena Le.   You will receive a reminder letter in the mail two months in advance. If you don't receive a letter, please call our office to schedule the follow-up appointment.  Remote monitoring is used to monitor your Pacemaker from home. This monitoring reduces the number of office visits required to check your device to one time per year. It allows [place] to keep an eye on the functioning of your device to ensure it is working properly. You are scheduled for a device check from home on 01/29/2020. You may send your transmission at any time that day. If you have a wireless device, the transmission will be sent automatically. After your physician reviews your transmission, you will receive a postcard with your next transmission date.  Any Other Special Instructions Will Be Listed Below (If Applicable).  If you need a refill on your cardiac medications before your next appointment, please call your pharmacy.

## 2020-01-13 NOTE — Progress Notes (Signed)
HPI [name] returns today for followup. She is a very pleasant 86 year old woman with a history of hypertension, palpitations, symptomatic bradycardia, status post permanent pacemaker insertion. She has been stable in the past year exceptfor back pain though this is better. Allergies  Allergen Reactions  . Tramadol Itching and Nausea Only  . Sulfa Antibiotics Nausea And Vomiting     Current Outpatient Medications  Medication Sig Dispense Refill  . acetaminophen (TYLENOL) 500 MG tablet Take 1,000 mg by mouth every 6 (six) hours as needed for mild pain.     Marland Kitchen acyclovir (ZOVIRAX) 400 MG tablet Take 400 mg by mouth 2 (two) times daily.    Marland Kitchen ALPRAZolam (XANAX) 0.25 MG tablet Take 0.25 mg by mouth at bedtime.     Marland Kitchen amLODipine (NORVASC) 5 MG tablet Take 5 mg by mouth at bedtime.     Marland Kitchen aspirin 81 MG tablet Take 81 mg by mouth every evening.     . Calcium Carbonate-Vitamin D (CALCIUM 600+D) 600-400 MG-UNIT tablet Take 1 tablet by mouth 2 (two) times daily.    . carvedilol (COREG) 6.25 MG tablet Take 1 tablet (6.25 mg total) by mouth 2 (two) times daily with a meal. 60 tablet 0  . cetaphil (CETAPHIL) cream Apply 1 application topically 2 (two) times daily. DAY AND EVENING SHIFT FOR DRY SKIN    . cycloSPORINE (RESTASIS) 0.05 % ophthalmic emulsion Place 1 drop into both eyes 2 (two) times daily.    . diphenhydrAMINE (BENADRYL) 25 MG tablet Take 25 mg by mouth every 6 (six) hours as needed for itching or allergies.    Marland Kitchen docusate sodium (COLACE) 100 MG capsule Take 100 mg by mouth 2 (two) times daily. For constipation    . DULoxetine (CYMBALTA) 20 MG capsule Take 1 capsule by mouth every morning.    . fexofenadine (ALLEGRA) 180 MG tablet Take 180 mg by mouth every morning.    . fluticasone (FLONASE) 50 MCG/ACT nasal spray Place 1 spray into both nostrils daily as needed for allergies or rhinitis.    . hydrALAZINE (APRESOLINE) 25 MG tablet Take 25 mg by mouth 2 (two) times daily.    Marland Kitchen  HYDROcodone-acetaminophen (NORCO/VICODIN) 5-325 MG tablet Take 1 tablet by mouth every 4 (four) hours as needed.    . hydrocortisone (ANUSOL-HC) 2.5 % rectal cream Place 1 application rectally 2 (two) times daily as needed for hemorrhoids or anal itching.    . hydrocortisone cream 0.5 % Apply 1 application topically 2 (two) times daily as needed for itching (for rash).    . Hypertonic Nasal Wash (SINUS RINSE NA) Place into the nose daily as needed (for sinusitis).    Marland Kitchen levothyroxine (SYNTHROID, LEVOTHROID) 75 MCG tablet Take 75 mcg by mouth daily before breakfast.     . loperamide (IMODIUM) 2 MG capsule Take 2 mg by mouth every 4 (four) hours as needed for diarrhea or loose stools.    . magnesium hydroxide (MILK OF MAGNESIA) 400 MG/5ML suspension Take by mouth 2 (two) times daily as needed for mild constipation (0.47ms administered as needed).    . meclizine (ANTIVERT) 25 MG tablet Take 25 mg by mouth every 6 (six) hours as needed for dizziness.    . meloxicam (MOBIC) 15 MG tablet Take 15 mg by mouth daily.    . mineral oil liquid Place in ear(s) every Thursday. 2 drops in both ears once daily on Thursdays for Cerumen Impaction    . multivitamin-lutein (OCUVITE-LUTEIN) CAPS capsule  Take 1 capsule by mouth 2 (two) times daily.    . Olopatadine HCl (PATADAY) 0.2 % SOLN Place 1 drop into both eyes daily.    . ondansetron (ZOFRAN) 8 MG tablet Take 8 mg by mouth every 8 (eight) hours as needed for nausea or vomiting.    Marland Kitchen oxycodone (OXY-IR) 5 MG capsule Take 5 mg by mouth every 4 (four) hours as needed for pain.    . pantoprazole (PROTONIX) 40 MG tablet Take 40 mg by mouth daily.    . polyethylene glycol (MIRALAX / GLYCOLAX) packet Take 17 g by mouth daily.    . predniSONE (DELTASONE) 5 MG tablet Take 5 mg by mouth daily.    . prochlorperazine (COMPAZINE) 10 MG tablet Take 10 mg by mouth every 6 (six) hours as needed for nausea or vomiting.    . senna (SENOKOT) 8.6 MG tablet Take 1 tablet by mouth 2  (two) times daily.    Marland Kitchen ZYRTEC ALLERGY 10 MG tablet Take 10 mg by mouth daily.     No current facility-administered medications for this visit.     Past Medical History:  Diagnosis Date  . Anemia   . Bradycardia   . Chronic back pain   . Constipation   . Degenerative joint disease   . Diverticulosis   . External hemorrhoids   . GERD (gastroesophageal reflux disease)   . Goiter, unspecified   . Hyperlipidemia   . Hypertension   . Hypothyroidism   . Macular degeneration   . Multiple myeloma ([place]) 03/12/2016  . Osteoporosis   . Pacemaker   . Palpitations   . Polymyalgia rheumatica ([place])   . Seasonal allergies   . Thyrotoxicosis without mention of goiter or other cause, without mention of thyrotoxic crisis or storm     ROS:   All systems reviewed and negative except as noted in the HPI.   Past Surgical History:  Procedure Laterality Date  . COLONOSCOPY  November 2011   External hemorrhoids, diverticulosis, anal papillae  . COLONOSCOPY N/A 04/01/2014   Procedure: COLONOSCOPY;  Surgeon: Rogene Houston, MD;  [facility];  Service: Endoscopy;  Laterality: N/A;  930  . FEMUR IM NAIL Left 12/29/2012   Procedure: INTRAMEDULLARY (IM) NAIL INTERTROCHANTRIC;  Surgeon: Melina Schools, MD;  Location: [facility];  Service: Orthopedics;  Laterality: Left;  . INSERT / REPLACE / REMOVE PACEMAKER    . PERMANENT PACEMAKER INSERTION N/A 06/07/2011   Procedure: PERMANENT PACEMAKER INSERTION;  Surgeon: Evans Lance, MD;  Location: [facility] CATH LAB;  Service: Cardiovascular;  Laterality: N/A;  . THYROIDECTOMY    . TONSILLECTOMY    . vertebroplasty       Family History  Problem Relation Age of Onset  . Heart attack Mother        Deceased  . Cancer Father        Deceased, colon cancer age 33  . Cancer Brother        Deceased, throat and lung     Social History   Socioeconomic History  . Marital status: Widowed    Spouse name: Not on file  . Number of children: Not on file    . Years of education: Not on file  . Highest education level: Not on file  Occupational History  . Not on file  Tobacco Use  . Smoking status: Never Smoker  . Smokeless tobacco: Never Used  Vaping Use  . Vaping Use: Never used  Substance and Sexual Activity  .  Alcohol use: No  . Drug use: No  . Sexual activity: Never  Other Topics Concern  . Not on file  Social History Narrative  . Not on file   Social Determinants of Health   Financial Resource Strain:   . Difficulty of Paying Living Expenses: Not on file  Food Insecurity:   . Worried About Charity fundraiser in the Last Year: Not on file  . Ran Out of Food in the Last Year: Not on file  Transportation Needs:   . Lack of Transportation (Medical): Not on file  . Lack of Transportation (Non-Medical): Not on file  Physical Activity:   . Days of Exercise per Week: Not on file  . Minutes of Exercise per Session: Not on file  Stress:   . Feeling of Stress : Not on file  Social Connections:   . Frequency of Communication with Friends and Family: Not on file  . Frequency of Social Gatherings with Friends and Family: Not on file  . Attends Religious Services: Not on file  . Active Member of Clubs or Organizations: Not on file  . Attends Archivist Meetings: Not on file  . Marital Status: Not on file  Intimate Partner Violence:   . Fear of Current or Ex-Partner: Not on file  . Emotionally Abused: Not on file  . Physically Abused: Not on file  . Sexually Abused: Not on file     BP 140/60   Pulse 72   Ht 5' 2"  (1.575 m)   Wt 142 lb (64.4 kg)   SpO2 91%   BMI 25.97 kg/m   Physical Exam:  Well appearing NAD HEENT: Unremarkable Neck:  No JVD, no thyromegally Lymphatics:  No adenopathy Back:  No CVA tenderness Lungs:  Clear with no wheezes HEART:  Regular rate rhythm, no murmurs, no rubs, no clicks Abd:  soft, positive bowel sounds, no organomegally, no rebound, no guarding Ext:  2 plus pulses, no  edema, no cyanosis, no clubbing Skin:  No rashes no nodules Neuro:  CN II through XII intact, motor grossly intact  DEVICE  Normal device function.  See PaceArt for details.   Assess/Plan: 1. HTN - I have asked her to take an extra hydralazine or two as needed if her bp is over 160. 2. PPM - her [employer] DDD PM is working normally. We will recheck in several months. 3. PAF - her atrial fib is well controlled with less than 1%. No change in meds.  Marilyn Overlie Vertis Scheib,MD

## 2020-01-15 DIAGNOSIS — C9000 Multiple myeloma not having achieved remission: Secondary | ICD-10-CM | POA: Diagnosis not present

## 2020-01-18 DIAGNOSIS — N289 Disorder of kidney and ureter, unspecified: Secondary | ICD-10-CM | POA: Diagnosis not present

## 2020-01-18 DIAGNOSIS — Z9221 Personal history of antineoplastic chemotherapy: Secondary | ICD-10-CM | POA: Diagnosis not present

## 2020-01-18 DIAGNOSIS — R52 Pain, unspecified: Secondary | ICD-10-CM | POA: Diagnosis not present

## 2020-01-18 DIAGNOSIS — D649 Anemia, unspecified: Secondary | ICD-10-CM | POA: Diagnosis not present

## 2020-01-18 DIAGNOSIS — E871 Hypo-osmolality and hyponatremia: Secondary | ICD-10-CM | POA: Diagnosis not present

## 2020-01-18 DIAGNOSIS — R5383 Other fatigue: Secondary | ICD-10-CM | POA: Diagnosis not present

## 2020-01-18 DIAGNOSIS — C9000 Multiple myeloma not having achieved remission: Secondary | ICD-10-CM | POA: Diagnosis not present

## 2020-01-18 DIAGNOSIS — Z79899 Other long term (current) drug therapy: Secondary | ICD-10-CM | POA: Diagnosis not present

## 2020-01-18 LAB — CUP PACEART INCLINIC DEVICE CHECK
Battery Remaining Longevity: 51 mo
Battery Voltage: 2.84 V
Brady Statistic RA Percent Paced: 41 %
Brady Statistic RV Percent Paced: 0.24 %
Date Time Interrogation Session: 20210922164641
Implantable Lead Implant Date: 20130214
Implantable Lead Implant Date: 20130214
Implantable Lead Location: 753859
Implantable Lead Location: 753860
Implantable Pulse Generator Implant Date: 20130214
Lead Channel Impedance Value: 375 Ohm
Lead Channel Impedance Value: 400 Ohm
Lead Channel Pacing Threshold Amplitude: 0.75 V
Lead Channel Pacing Threshold Amplitude: 1 V
Lead Channel Pacing Threshold Pulse Width: 0.5 ms
Lead Channel Pacing Threshold Pulse Width: 0.5 ms
Lead Channel Sensing Intrinsic Amplitude: 12 mV
Lead Channel Sensing Intrinsic Amplitude: 3 mV
Lead Channel Setting Pacing Amplitude: 1.375
Lead Channel Setting Pacing Amplitude: 1.625
Lead Channel Setting Pacing Pulse Width: 0.5 ms
Lead Channel Setting Sensing Sensitivity: 2 mV
Pulse Gen Model: 2210
Pulse Gen Serial Number: 7316836

## 2020-01-19 DIAGNOSIS — M1990 Unspecified osteoarthritis, unspecified site: Secondary | ICD-10-CM | POA: Diagnosis not present

## 2020-01-19 DIAGNOSIS — B351 Tinea unguium: Secondary | ICD-10-CM | POA: Diagnosis not present

## 2020-01-19 DIAGNOSIS — G894 Chronic pain syndrome: Secondary | ICD-10-CM | POA: Diagnosis not present

## 2020-01-19 DIAGNOSIS — M79674 Pain in right toe(s): Secondary | ICD-10-CM | POA: Diagnosis not present

## 2020-01-19 DIAGNOSIS — M353 Polymyalgia rheumatica: Secondary | ICD-10-CM | POA: Diagnosis not present

## 2020-01-19 DIAGNOSIS — M79675 Pain in left toe(s): Secondary | ICD-10-CM | POA: Diagnosis not present

## 2020-01-20 DIAGNOSIS — F064 Anxiety disorder due to known physiological condition: Secondary | ICD-10-CM | POA: Diagnosis not present

## 2020-01-20 DIAGNOSIS — F331 Major depressive disorder, recurrent, moderate: Secondary | ICD-10-CM | POA: Diagnosis not present

## 2020-01-20 DIAGNOSIS — F3341 Major depressive disorder, recurrent, in partial remission: Secondary | ICD-10-CM | POA: Diagnosis not present

## 2020-01-20 DIAGNOSIS — G894 Chronic pain syndrome: Secondary | ICD-10-CM | POA: Diagnosis not present

## 2020-01-20 DIAGNOSIS — M1990 Unspecified osteoarthritis, unspecified site: Secondary | ICD-10-CM | POA: Diagnosis not present

## 2020-01-20 DIAGNOSIS — G4700 Insomnia, unspecified: Secondary | ICD-10-CM | POA: Diagnosis not present

## 2020-01-21 DIAGNOSIS — H6123 Impacted cerumen, bilateral: Secondary | ICD-10-CM | POA: Diagnosis not present

## 2020-01-21 DIAGNOSIS — H903 Sensorineural hearing loss, bilateral: Secondary | ICD-10-CM | POA: Diagnosis not present

## 2020-01-29 ENCOUNTER — Ambulatory Visit (INDEPENDENT_AMBULATORY_CARE_PROVIDER_SITE_OTHER): Payer: Medicare Other

## 2020-01-29 DIAGNOSIS — I480 Paroxysmal atrial fibrillation: Secondary | ICD-10-CM | POA: Diagnosis not present

## 2020-01-29 LAB — CUP PACEART REMOTE DEVICE CHECK
Battery Remaining Longevity: 50 mo
Battery Remaining Percentage: 40 %
Battery Voltage: 2.84 V
Brady Statistic AP VP Percent: 1 %
Brady Statistic AP VS Percent: 35 %
Brady Statistic AS VP Percent: 1 %
Brady Statistic AS VS Percent: 64 %
Brady Statistic RA Percent Paced: 32 %
Brady Statistic RV Percent Paced: 1 %
Date Time Interrogation Session: 20211008020007
Implantable Lead Implant Date: 20130214
Implantable Lead Implant Date: 20130214
Implantable Lead Location: 753859
Implantable Lead Location: 753860
Implantable Pulse Generator Implant Date: 20130214
Lead Channel Impedance Value: 390 Ohm
Lead Channel Impedance Value: 410 Ohm
Lead Channel Pacing Threshold Amplitude: 0.625 V
Lead Channel Pacing Threshold Amplitude: 1.125 V
Lead Channel Pacing Threshold Pulse Width: 0.5 ms
Lead Channel Pacing Threshold Pulse Width: 0.5 ms
Lead Channel Sensing Intrinsic Amplitude: 12 mV
Lead Channel Sensing Intrinsic Amplitude: 2.9 mV
Lead Channel Setting Pacing Amplitude: 1.375
Lead Channel Setting Pacing Amplitude: 1.625
Lead Channel Setting Pacing Pulse Width: 0.5 ms
Lead Channel Setting Sensing Sensitivity: 2 mV
Pulse Gen Model: 2210
Pulse Gen Serial Number: 7316836

## 2020-02-01 DIAGNOSIS — F331 Major depressive disorder, recurrent, moderate: Secondary | ICD-10-CM | POA: Diagnosis not present

## 2020-02-02 NOTE — Progress Notes (Signed)
Remote pacemaker transmission.   

## 2020-02-10 DIAGNOSIS — F0280 Dementia in other diseases classified elsewhere without behavioral disturbance: Secondary | ICD-10-CM | POA: Diagnosis not present

## 2020-02-10 DIAGNOSIS — R110 Nausea: Secondary | ICD-10-CM | POA: Diagnosis not present

## 2020-02-10 DIAGNOSIS — H814 Vertigo of central origin: Secondary | ICD-10-CM | POA: Diagnosis not present

## 2020-02-10 DIAGNOSIS — C9001 Multiple myeloma in remission: Secondary | ICD-10-CM | POA: Diagnosis not present

## 2020-02-10 DIAGNOSIS — F419 Anxiety disorder, unspecified: Secondary | ICD-10-CM | POA: Diagnosis not present

## 2020-02-10 DIAGNOSIS — J309 Allergic rhinitis, unspecified: Secondary | ICD-10-CM | POA: Diagnosis not present

## 2020-02-10 DIAGNOSIS — G301 Alzheimer's disease with late onset: Secondary | ICD-10-CM | POA: Diagnosis not present

## 2020-02-17 DIAGNOSIS — G894 Chronic pain syndrome: Secondary | ICD-10-CM | POA: Diagnosis not present

## 2020-02-17 DIAGNOSIS — Z23 Encounter for immunization: Secondary | ICD-10-CM | POA: Diagnosis not present

## 2020-02-17 DIAGNOSIS — K5900 Constipation, unspecified: Secondary | ICD-10-CM | POA: Diagnosis not present

## 2020-02-17 DIAGNOSIS — M1990 Unspecified osteoarthritis, unspecified site: Secondary | ICD-10-CM | POA: Diagnosis not present

## 2020-02-22 DIAGNOSIS — C9000 Multiple myeloma not having achieved remission: Secondary | ICD-10-CM | POA: Diagnosis not present

## 2020-02-29 ENCOUNTER — Emergency Department (HOSPITAL_COMMUNITY)
Admission: EM | Admit: 2020-02-29 | Discharge: 2020-02-29 | Discharge status: Absent without leave | Payer: Medicare Other

## 2020-02-29 DIAGNOSIS — R5381 Other malaise: Secondary | ICD-10-CM | POA: Diagnosis not present

## 2020-02-29 DIAGNOSIS — D649 Anemia, unspecified: Secondary | ICD-10-CM | POA: Diagnosis not present

## 2020-02-29 DIAGNOSIS — C9000 Multiple myeloma not having achieved remission: Secondary | ICD-10-CM | POA: Diagnosis not present

## 2020-02-29 DIAGNOSIS — R197 Diarrhea, unspecified: Secondary | ICD-10-CM | POA: Diagnosis not present

## 2020-02-29 DIAGNOSIS — Z79899 Other long term (current) drug therapy: Secondary | ICD-10-CM | POA: Diagnosis not present

## 2020-02-29 DIAGNOSIS — R5383 Other fatigue: Secondary | ICD-10-CM | POA: Diagnosis not present

## 2020-02-29 DIAGNOSIS — R21 Rash and other nonspecific skin eruption: Secondary | ICD-10-CM | POA: Diagnosis not present

## 2020-03-02 DIAGNOSIS — Z7952 Long term (current) use of systemic steroids: Secondary | ICD-10-CM | POA: Diagnosis not present

## 2020-03-02 DIAGNOSIS — I10 Essential (primary) hypertension: Secondary | ICD-10-CM | POA: Diagnosis not present

## 2020-03-02 DIAGNOSIS — Z79899 Other long term (current) drug therapy: Secondary | ICD-10-CM | POA: Diagnosis not present

## 2020-03-02 DIAGNOSIS — G893 Neoplasm related pain (acute) (chronic): Secondary | ICD-10-CM | POA: Diagnosis not present

## 2020-03-02 DIAGNOSIS — D630 Anemia in neoplastic disease: Secondary | ICD-10-CM | POA: Diagnosis not present

## 2020-03-02 DIAGNOSIS — C9000 Multiple myeloma not having achieved remission: Secondary | ICD-10-CM | POA: Diagnosis not present

## 2020-03-02 DIAGNOSIS — E039 Hypothyroidism, unspecified: Secondary | ICD-10-CM | POA: Diagnosis not present

## 2020-03-02 DIAGNOSIS — R530 Neoplastic (malignant) related fatigue: Secondary | ICD-10-CM | POA: Diagnosis not present

## 2020-03-02 DIAGNOSIS — H3530 Unspecified macular degeneration: Secondary | ICD-10-CM | POA: Diagnosis not present

## 2020-03-02 DIAGNOSIS — Z950 Presence of cardiac pacemaker: Secondary | ICD-10-CM | POA: Diagnosis not present

## 2020-03-02 DIAGNOSIS — Z79891 Long term (current) use of opiate analgesic: Secondary | ICD-10-CM | POA: Diagnosis not present

## 2020-03-02 DIAGNOSIS — M353 Polymyalgia rheumatica: Secondary | ICD-10-CM | POA: Diagnosis not present

## 2020-03-02 DIAGNOSIS — M1712 Unilateral primary osteoarthritis, left knee: Secondary | ICD-10-CM | POA: Diagnosis not present

## 2020-03-04 DIAGNOSIS — R530 Neoplastic (malignant) related fatigue: Secondary | ICD-10-CM | POA: Diagnosis not present

## 2020-03-04 DIAGNOSIS — I10 Essential (primary) hypertension: Secondary | ICD-10-CM | POA: Diagnosis not present

## 2020-03-04 DIAGNOSIS — G893 Neoplasm related pain (acute) (chronic): Secondary | ICD-10-CM | POA: Diagnosis not present

## 2020-03-04 DIAGNOSIS — D630 Anemia in neoplastic disease: Secondary | ICD-10-CM | POA: Diagnosis not present

## 2020-03-04 DIAGNOSIS — M1712 Unilateral primary osteoarthritis, left knee: Secondary | ICD-10-CM | POA: Diagnosis not present

## 2020-03-04 DIAGNOSIS — C9000 Multiple myeloma not having achieved remission: Secondary | ICD-10-CM | POA: Diagnosis not present

## 2020-03-07 DIAGNOSIS — F419 Anxiety disorder, unspecified: Secondary | ICD-10-CM | POA: Diagnosis not present

## 2020-03-08 DIAGNOSIS — R530 Neoplastic (malignant) related fatigue: Secondary | ICD-10-CM | POA: Diagnosis not present

## 2020-03-08 DIAGNOSIS — D630 Anemia in neoplastic disease: Secondary | ICD-10-CM | POA: Diagnosis not present

## 2020-03-08 DIAGNOSIS — G893 Neoplasm related pain (acute) (chronic): Secondary | ICD-10-CM | POA: Diagnosis not present

## 2020-03-08 DIAGNOSIS — C9000 Multiple myeloma not having achieved remission: Secondary | ICD-10-CM | POA: Diagnosis not present

## 2020-03-08 DIAGNOSIS — I10 Essential (primary) hypertension: Secondary | ICD-10-CM | POA: Diagnosis not present

## 2020-03-08 DIAGNOSIS — M1712 Unilateral primary osteoarthritis, left knee: Secondary | ICD-10-CM | POA: Diagnosis not present

## 2020-03-11 DIAGNOSIS — G893 Neoplasm related pain (acute) (chronic): Secondary | ICD-10-CM | POA: Diagnosis not present

## 2020-03-11 DIAGNOSIS — D630 Anemia in neoplastic disease: Secondary | ICD-10-CM | POA: Diagnosis not present

## 2020-03-11 DIAGNOSIS — M1712 Unilateral primary osteoarthritis, left knee: Secondary | ICD-10-CM | POA: Diagnosis not present

## 2020-03-11 DIAGNOSIS — C9000 Multiple myeloma not having achieved remission: Secondary | ICD-10-CM | POA: Diagnosis not present

## 2020-03-11 DIAGNOSIS — R530 Neoplastic (malignant) related fatigue: Secondary | ICD-10-CM | POA: Diagnosis not present

## 2020-03-11 DIAGNOSIS — I10 Essential (primary) hypertension: Secondary | ICD-10-CM | POA: Diagnosis not present

## 2020-03-14 DIAGNOSIS — M1712 Unilateral primary osteoarthritis, left knee: Secondary | ICD-10-CM | POA: Diagnosis not present

## 2020-03-14 DIAGNOSIS — M25552 Pain in left hip: Secondary | ICD-10-CM | POA: Diagnosis not present

## 2020-03-14 DIAGNOSIS — Z888 Allergy status to other drugs, medicaments and biological substances status: Secondary | ICD-10-CM | POA: Diagnosis not present

## 2020-03-14 DIAGNOSIS — M438X6 Other specified deforming dorsopathies, lumbar region: Secondary | ICD-10-CM | POA: Diagnosis not present

## 2020-03-14 DIAGNOSIS — D166 Benign neoplasm of vertebral column: Secondary | ICD-10-CM | POA: Diagnosis not present

## 2020-03-14 DIAGNOSIS — M47816 Spondylosis without myelopathy or radiculopathy, lumbar region: Secondary | ICD-10-CM | POA: Diagnosis not present

## 2020-03-14 DIAGNOSIS — M160 Bilateral primary osteoarthritis of hip: Secondary | ICD-10-CM | POA: Diagnosis not present

## 2020-03-14 DIAGNOSIS — M21852 Other specified acquired deformities of left thigh: Secondary | ICD-10-CM | POA: Diagnosis not present

## 2020-03-14 DIAGNOSIS — R627 Adult failure to thrive: Secondary | ICD-10-CM | POA: Diagnosis not present

## 2020-03-14 DIAGNOSIS — M25462 Effusion, left knee: Secondary | ICD-10-CM | POA: Diagnosis not present

## 2020-03-14 DIAGNOSIS — M47818 Spondylosis without myelopathy or radiculopathy, sacral and sacrococcygeal region: Secondary | ICD-10-CM | POA: Diagnosis not present

## 2020-03-14 DIAGNOSIS — Z885 Allergy status to narcotic agent status: Secondary | ICD-10-CM | POA: Diagnosis not present

## 2020-03-14 DIAGNOSIS — C9002 Multiple myeloma in relapse: Secondary | ICD-10-CM | POA: Diagnosis not present

## 2020-03-14 DIAGNOSIS — Z882 Allergy status to sulfonamides status: Secondary | ICD-10-CM | POA: Diagnosis not present

## 2020-03-14 DIAGNOSIS — R52 Pain, unspecified: Secondary | ICD-10-CM | POA: Diagnosis not present

## 2020-03-14 DIAGNOSIS — D649 Anemia, unspecified: Secondary | ICD-10-CM | POA: Diagnosis not present

## 2020-03-14 DIAGNOSIS — R21 Rash and other nonspecific skin eruption: Secondary | ICD-10-CM | POA: Diagnosis not present

## 2020-03-14 DIAGNOSIS — M85862 Other specified disorders of bone density and structure, left lower leg: Secondary | ICD-10-CM | POA: Diagnosis not present

## 2020-03-14 DIAGNOSIS — Z9889 Other specified postprocedural states: Secondary | ICD-10-CM | POA: Diagnosis not present

## 2020-03-14 DIAGNOSIS — C9000 Multiple myeloma not having achieved remission: Secondary | ICD-10-CM | POA: Diagnosis not present

## 2020-03-14 DIAGNOSIS — M545 Low back pain, unspecified: Secondary | ICD-10-CM | POA: Diagnosis not present

## 2020-03-15 DIAGNOSIS — D630 Anemia in neoplastic disease: Secondary | ICD-10-CM | POA: Diagnosis not present

## 2020-03-15 DIAGNOSIS — I10 Essential (primary) hypertension: Secondary | ICD-10-CM | POA: Diagnosis not present

## 2020-03-15 DIAGNOSIS — R530 Neoplastic (malignant) related fatigue: Secondary | ICD-10-CM | POA: Diagnosis not present

## 2020-03-15 DIAGNOSIS — C9000 Multiple myeloma not having achieved remission: Secondary | ICD-10-CM | POA: Diagnosis not present

## 2020-03-15 DIAGNOSIS — M1712 Unilateral primary osteoarthritis, left knee: Secondary | ICD-10-CM | POA: Diagnosis not present

## 2020-03-15 DIAGNOSIS — G893 Neoplasm related pain (acute) (chronic): Secondary | ICD-10-CM | POA: Diagnosis not present

## 2020-03-18 DIAGNOSIS — I10 Essential (primary) hypertension: Secondary | ICD-10-CM | POA: Diagnosis not present

## 2020-03-18 DIAGNOSIS — M1712 Unilateral primary osteoarthritis, left knee: Secondary | ICD-10-CM | POA: Diagnosis not present

## 2020-03-18 DIAGNOSIS — G893 Neoplasm related pain (acute) (chronic): Secondary | ICD-10-CM | POA: Diagnosis not present

## 2020-03-18 DIAGNOSIS — C9000 Multiple myeloma not having achieved remission: Secondary | ICD-10-CM | POA: Diagnosis not present

## 2020-03-18 DIAGNOSIS — R530 Neoplastic (malignant) related fatigue: Secondary | ICD-10-CM | POA: Diagnosis not present

## 2020-03-18 DIAGNOSIS — D630 Anemia in neoplastic disease: Secondary | ICD-10-CM | POA: Diagnosis not present

## 2020-03-22 DIAGNOSIS — M1712 Unilateral primary osteoarthritis, left knee: Secondary | ICD-10-CM | POA: Diagnosis not present

## 2020-03-22 DIAGNOSIS — C9000 Multiple myeloma not having achieved remission: Secondary | ICD-10-CM | POA: Diagnosis not present

## 2020-03-22 DIAGNOSIS — R530 Neoplastic (malignant) related fatigue: Secondary | ICD-10-CM | POA: Diagnosis not present

## 2020-03-22 DIAGNOSIS — D630 Anemia in neoplastic disease: Secondary | ICD-10-CM | POA: Diagnosis not present

## 2020-03-22 DIAGNOSIS — G893 Neoplasm related pain (acute) (chronic): Secondary | ICD-10-CM | POA: Diagnosis not present

## 2020-03-22 DIAGNOSIS — I10 Essential (primary) hypertension: Secondary | ICD-10-CM | POA: Diagnosis not present

## 2020-03-24 DIAGNOSIS — R530 Neoplastic (malignant) related fatigue: Secondary | ICD-10-CM | POA: Diagnosis not present

## 2020-03-24 DIAGNOSIS — G893 Neoplasm related pain (acute) (chronic): Secondary | ICD-10-CM | POA: Diagnosis not present

## 2020-03-24 DIAGNOSIS — I10 Essential (primary) hypertension: Secondary | ICD-10-CM | POA: Diagnosis not present

## 2020-03-24 DIAGNOSIS — C9000 Multiple myeloma not having achieved remission: Secondary | ICD-10-CM | POA: Diagnosis not present

## 2020-03-24 DIAGNOSIS — M1712 Unilateral primary osteoarthritis, left knee: Secondary | ICD-10-CM | POA: Diagnosis not present

## 2020-03-24 DIAGNOSIS — D630 Anemia in neoplastic disease: Secondary | ICD-10-CM | POA: Diagnosis not present

## 2020-03-28 DIAGNOSIS — M545 Low back pain, unspecified: Secondary | ICD-10-CM | POA: Diagnosis not present

## 2020-03-28 DIAGNOSIS — R627 Adult failure to thrive: Secondary | ICD-10-CM | POA: Diagnosis not present

## 2020-03-28 DIAGNOSIS — R21 Rash and other nonspecific skin eruption: Secondary | ICD-10-CM | POA: Diagnosis not present

## 2020-03-28 DIAGNOSIS — C9000 Multiple myeloma not having achieved remission: Secondary | ICD-10-CM | POA: Diagnosis not present

## 2020-03-28 DIAGNOSIS — R531 Weakness: Secondary | ICD-10-CM | POA: Diagnosis not present

## 2020-03-28 DIAGNOSIS — C9002 Multiple myeloma in relapse: Secondary | ICD-10-CM | POA: Diagnosis not present

## 2020-03-30 DIAGNOSIS — F331 Major depressive disorder, recurrent, moderate: Secondary | ICD-10-CM | POA: Diagnosis not present

## 2020-03-30 DIAGNOSIS — D509 Iron deficiency anemia, unspecified: Secondary | ICD-10-CM | POA: Diagnosis not present

## 2020-03-30 DIAGNOSIS — K5900 Constipation, unspecified: Secondary | ICD-10-CM | POA: Diagnosis not present

## 2020-03-30 DIAGNOSIS — D630 Anemia in neoplastic disease: Secondary | ICD-10-CM | POA: Diagnosis not present

## 2020-03-30 DIAGNOSIS — F419 Anxiety disorder, unspecified: Secondary | ICD-10-CM | POA: Diagnosis not present

## 2020-03-30 DIAGNOSIS — K219 Gastro-esophageal reflux disease without esophagitis: Secondary | ICD-10-CM | POA: Diagnosis not present

## 2020-03-30 DIAGNOSIS — M353 Polymyalgia rheumatica: Secondary | ICD-10-CM | POA: Diagnosis not present

## 2020-03-30 DIAGNOSIS — I10 Essential (primary) hypertension: Secondary | ICD-10-CM | POA: Diagnosis not present

## 2020-03-30 DIAGNOSIS — E039 Hypothyroidism, unspecified: Secondary | ICD-10-CM | POA: Diagnosis not present

## 2020-03-30 DIAGNOSIS — C9000 Multiple myeloma not having achieved remission: Secondary | ICD-10-CM | POA: Diagnosis not present

## 2020-03-30 DIAGNOSIS — G893 Neoplasm related pain (acute) (chronic): Secondary | ICD-10-CM | POA: Diagnosis not present

## 2020-03-30 DIAGNOSIS — E559 Vitamin D deficiency, unspecified: Secondary | ICD-10-CM | POA: Diagnosis not present

## 2020-03-30 DIAGNOSIS — F0280 Dementia in other diseases classified elsewhere without behavioral disturbance: Secondary | ICD-10-CM | POA: Diagnosis not present

## 2020-03-30 DIAGNOSIS — M1712 Unilateral primary osteoarthritis, left knee: Secondary | ICD-10-CM | POA: Diagnosis not present

## 2020-03-30 DIAGNOSIS — R530 Neoplastic (malignant) related fatigue: Secondary | ICD-10-CM | POA: Diagnosis not present

## 2020-04-01 DIAGNOSIS — C9000 Multiple myeloma not having achieved remission: Secondary | ICD-10-CM | POA: Diagnosis not present

## 2020-04-05 DIAGNOSIS — B351 Tinea unguium: Secondary | ICD-10-CM | POA: Diagnosis not present

## 2020-04-05 DIAGNOSIS — M79674 Pain in right toe(s): Secondary | ICD-10-CM | POA: Diagnosis not present

## 2020-04-05 DIAGNOSIS — M79675 Pain in left toe(s): Secondary | ICD-10-CM | POA: Diagnosis not present

## 2020-04-07 ENCOUNTER — Other Ambulatory Visit (HOSPITAL_COMMUNITY)
Admission: RE | Admit: 2020-04-07 | Discharge: 2020-04-07 | Disposition: A | Discharge status: Home / Self Care | Payer: Medicare Other | Source: Skilled Nursing Facility | Attending: Family Medicine | Admitting: Family Medicine

## 2020-04-07 DIAGNOSIS — N390 Urinary tract infection, site not specified: Secondary | ICD-10-CM | POA: Insufficient documentation

## 2020-04-07 LAB — URINALYSIS, ROUTINE W REFLEX MICROSCOPIC
Bilirubin Urine: NEGATIVE
Glucose, UA: NEGATIVE mg/dL
Hgb urine dipstick: NEGATIVE
Ketones, ur: NEGATIVE mg/dL
Leukocytes,Ua: NEGATIVE
Nitrite: NEGATIVE
Protein, ur: 100 mg/dL — AB
Specific Gravity, Urine: 1.019 (ref 1.005–1.030)
pH: 5 (ref 5.0–8.0)

## 2020-04-08 ENCOUNTER — Ambulatory Visit
Admission: RE | Admit: 2020-04-08 | Discharge: 2020-04-08 | Disposition: A | Discharge status: Home / Self Care | Payer: Medicare Other | Source: Ambulatory Visit | Attending: Emergency Medicine | Admitting: Emergency Medicine

## 2020-04-08 ENCOUNTER — Encounter: Payer: Self-pay | Admitting: Internal Medicine

## 2020-04-08 ENCOUNTER — Other Ambulatory Visit: Payer: Self-pay

## 2020-04-08 VITALS — BP 146/53 | HR 60 | Temp 98.3°F | Resp 18

## 2020-04-08 DIAGNOSIS — D509 Iron deficiency anemia, unspecified: Secondary | ICD-10-CM | POA: Diagnosis not present

## 2020-04-08 DIAGNOSIS — D519 Vitamin B12 deficiency anemia, unspecified: Secondary | ICD-10-CM | POA: Diagnosis not present

## 2020-04-08 DIAGNOSIS — R109 Unspecified abdominal pain: Secondary | ICD-10-CM | POA: Diagnosis not present

## 2020-04-08 DIAGNOSIS — M353 Polymyalgia rheumatica: Secondary | ICD-10-CM | POA: Diagnosis not present

## 2020-04-08 DIAGNOSIS — E039 Hypothyroidism, unspecified: Secondary | ICD-10-CM | POA: Diagnosis not present

## 2020-04-08 DIAGNOSIS — E559 Vitamin D deficiency, unspecified: Secondary | ICD-10-CM | POA: Diagnosis not present

## 2020-04-08 LAB — POCT URINALYSIS DIP (MANUAL ENTRY)
Bilirubin, UA: NEGATIVE
Blood, UA: NEGATIVE
Glucose, UA: NEGATIVE mg/dL
Ketones, POC UA: NEGATIVE mg/dL
Leukocytes, UA: NEGATIVE
Nitrite, UA: NEGATIVE
Protein Ur, POC: 100 mg/dL — AB
Spec Grav, UA: 1.02 (ref 1.010–1.025)
Urobilinogen, UA: 0.2 E.U./dL
pH, UA: 7 (ref 5.0–8.0)

## 2020-04-08 LAB — URINE CULTURE: Culture: <10000 — AB

## 2020-04-08 NOTE — ED Triage Notes (Signed)
Pt presents with c/o pain under right breast and flank area that has been intermittent since this morning, denies injury

## 2020-04-08 NOTE — Discharge Instructions (Addendum)
Urinalysis is negative for UTI Urine culture sent.  We will call you with abnormal results.   Push fluids and get plenty of rest.   Continue to take Tylenol 500 mg every 4-6 hours as needed for pain  May use ice to massage pain area Follow up with PCP if symptoms persists Return here or go to ER if you have any new or worsening symptoms such as fever, worsening abdominal pain, nausea/vomiting, flank pain, etc..Marland Kitchen

## 2020-04-08 NOTE — ED Provider Notes (Signed)
[facility]   Chief Complaint  Patient presents with  . Flank Pain     SUBJECTIVE:  [name] is a 86 y.o. female who presented to the urgent care for complaint of right flank pain that started this morning.  Patient denies a precipitating event.  Localizes the pain to the right flank.  Pain is intermittent and describes it as achy.  Has tried OTC medications without relief.  Symptoms are made worse with movement.  Denies similar symptoms in the past.  Denies fever, chills, nausea, vomiting, abdominal pain, abnormal vaginal discharge or bleeding, hematuria.    LMP: No LMP recorded. Patient has had a hysterectomy.  ROS: As in HPI.  All other pertinent ROS negative.     Past Medical History:  Diagnosis Date  . Anemia   . Bradycardia   . Chronic back pain   . Constipation   . Degenerative joint disease   . Diverticulosis   . External hemorrhoids   . GERD (gastroesophageal reflux disease)   . Goiter, unspecified   . Hyperlipidemia   . Hypertension   . Hypothyroidism   . Macular degeneration   . Multiple myeloma ([place]) 03/12/2016  . Osteoporosis   . Pacemaker   . Palpitations   . Polymyalgia rheumatica ([place])   . Seasonal allergies   . Thyrotoxicosis without mention of goiter or other cause, without mention of thyrotoxic crisis or storm    Past Surgical History:  Procedure Laterality Date  . COLONOSCOPY  November 2011   External hemorrhoids, diverticulosis, anal papillae  . COLONOSCOPY N/A 04/01/2014   Procedure: COLONOSCOPY;  Surgeon: Rogene Houston, MD;  [facility];  Service: Endoscopy;  Laterality: N/A;  930  . FEMUR IM NAIL Left 12/29/2012   Procedure: INTRAMEDULLARY (IM) NAIL INTERTROCHANTRIC;  Surgeon: Melina Schools, MD;  Location: [place];  Service: Orthopedics;  Laterality: Left;  . INSERT / REPLACE / REMOVE PACEMAKER    . PERMANENT PACEMAKER INSERTION N/A 06/07/2011   Procedure: PERMANENT PACEMAKER INSERTION;  Surgeon: Evans Lance, MD;   Location: [facility] CATH LAB;  Service: Cardiovascular;  Laterality: N/A;  . THYROIDECTOMY    . TONSILLECTOMY    . vertebroplasty     Allergies  Allergen Reactions  . Tramadol Itching and Nausea Only  . Sulfa Antibiotics Nausea And Vomiting   No current facility-administered medications on file prior to encounter.   Current Outpatient Medications on File Prior to Encounter  Medication Sig Dispense Refill  . acetaminophen (TYLENOL) 500 MG tablet Take 1,000 mg by mouth every 6 (six) hours as needed for mild pain.     Marland Kitchen acyclovir (ZOVIRAX) 400 MG tablet Take 400 mg by mouth 2 (two) times daily.    Marland Kitchen ALPRAZolam (XANAX) 0.25 MG tablet Take 0.25 mg by mouth at bedtime.     Marland Kitchen amLODipine (NORVASC) 5 MG tablet Take 5 mg by mouth at bedtime.     Marland Kitchen aspirin 81 MG tablet Take 81 mg by mouth every evening.     . Calcium Carbonate-Vitamin D (CALCIUM 600+D) 600-400 MG-UNIT tablet Take 1 tablet by mouth 2 (two) times daily.    . carvedilol (COREG) 6.25 MG tablet Take 1 tablet (6.25 mg total) by mouth 2 (two) times daily with a meal. 60 tablet 0  . cetaphil (CETAPHIL) cream Apply 1 application topically 2 (two) times daily. DAY AND EVENING SHIFT FOR DRY SKIN    . cycloSPORINE (RESTASIS) 0.05 % ophthalmic emulsion Place 1 drop into both eyes  2 (two) times daily.    . diphenhydrAMINE (BENADRYL) 25 MG tablet Take 25 mg by mouth every 6 (six) hours as needed for itching or allergies.    Marland Kitchen docusate sodium (COLACE) 100 MG capsule Take 100 mg by mouth 2 (two) times daily. For constipation    . DULoxetine (CYMBALTA) 20 MG capsule Take 1 capsule by mouth every morning.    . fexofenadine (ALLEGRA) 180 MG tablet Take 180 mg by mouth every morning.    . fluticasone (FLONASE) 50 MCG/ACT nasal spray Place 1 spray into both nostrils daily as needed for allergies or rhinitis.    . hydrALAZINE (APRESOLINE) 25 MG tablet Take 25 mg by mouth 2 (two) times daily.    Marland Kitchen HYDROcodone-acetaminophen (NORCO/VICODIN) 5-325 MG tablet Take 1  tablet by mouth every 4 (four) hours as needed.    . hydrocortisone (ANUSOL-HC) 2.5 % rectal cream Place 1 application rectally 2 (two) times daily as needed for hemorrhoids or anal itching.    . hydrocortisone cream 0.5 % Apply 1 application topically 2 (two) times daily as needed for itching (for rash).    . Hypertonic Nasal Wash (SINUS RINSE NA) Place into the nose daily as needed (for sinusitis).    Marland Kitchen levothyroxine (SYNTHROID, LEVOTHROID) 75 MCG tablet Take 75 mcg by mouth daily before breakfast.     . loperamide (IMODIUM) 2 MG capsule Take 2 mg by mouth every 4 (four) hours as needed for diarrhea or loose stools.    . magnesium hydroxide (MILK OF MAGNESIA) 400 MG/5ML suspension Take by mouth 2 (two) times daily as needed for mild constipation (0.62ms administered as needed).    . meclizine (ANTIVERT) 25 MG tablet Take 25 mg by mouth every 6 (six) hours as needed for dizziness.    . meloxicam (MOBIC) 15 MG tablet Take 15 mg by mouth daily.    . mineral oil liquid Place in ear(s) every Thursday. 2 drops in both ears once daily on Thursdays for Cerumen Impaction    . multivitamin-lutein (OCUVITE-LUTEIN) CAPS capsule Take 1 capsule by mouth 2 (two) times daily.    . Olopatadine HCl (PATADAY) 0.2 % SOLN Place 1 drop into both eyes daily.    . ondansetron (ZOFRAN) 8 MG tablet Take 8 mg by mouth every 8 (eight) hours as needed for nausea or vomiting.    .Marland Kitchenoxycodone (OXY-IR) 5 MG capsule Take 5 mg by mouth every 4 (four) hours as needed for pain.    . pantoprazole (PROTONIX) 40 MG tablet Take 40 mg by mouth daily.    . polyethylene glycol (MIRALAX / GLYCOLAX) packet Take 17 g by mouth daily.    . predniSONE (DELTASONE) 5 MG tablet Take 5 mg by mouth daily.    . prochlorperazine (COMPAZINE) 10 MG tablet Take 10 mg by mouth every 6 (six) hours as needed for nausea or vomiting.    . senna (SENOKOT) 8.6 MG tablet Take 1 tablet by mouth 2 (two) times daily.    .Marland KitchenZYRTEC ALLERGY 10 MG tablet Take 10 mg by  mouth daily.     Social History   Socioeconomic History  . Marital status: Single    Spouse name: Not on file  . Number of children: Not on file  . Years of education: Not on file  . Highest education level: Not on file  Occupational History  . Not on file  Tobacco Use  . Smoking status: Never Smoker  . Smokeless tobacco: Never Used  Vaping Use  .  Vaping Use: Never used  Substance and Sexual Activity  . Alcohol use: No  . Drug use: No  . Sexual activity: Never  Other Topics Concern  . Not on file  Social History Narrative   ** Merged History Encounter **       Social Determinants of Health   Financial Resource Strain: Not on file  Food Insecurity: Not on file  Transportation Needs: Not on file  Physical Activity: Not on file  Stress: Not on file  Social Connections: Not on file  Intimate Partner Violence: Not on file   Family History  Problem Relation Age of Onset  . Heart attack Mother        Deceased  . Cancer Father        Deceased, colon cancer age 47  . Cancer Brother        Deceased, throat and lung    OBJECTIVE:  Vitals:   04/08/20 1804  BP: (!) 146/53  Pulse: 60  Resp: 18  Temp: 98.3 F (36.8 C)  SpO2: 95%   General appearance: AOx3 in no acute distress HEENT: NCAT.  Oropharynx clear.  Lungs: clear to auscultation bilaterally without adventitious breath sounds Heart: regular rate and rhythm.  Radial pulses 2+ symmetrical bilaterally Abdomen: soft; non-distended; no tenderness; bowel sounds present; no guarding or rebound tenderness Back: no CVA tenderness Extremities: no edema; symmetrical with no gross deformities Skin: warm and dry Neurologic: Ambulates from chair to exam table without difficulty Psychological: alert and cooperative; normal mood and affect  Labs Reviewed  POCT URINALYSIS DIP (MANUAL ENTRY) - Abnormal; Notable for the following components:      Result Value   Protein Ur, POC =100 (*)    All other components within  normal limits    ASSESSMENT & PLAN:  1. Right flank pain     No orders of the defined types were placed in this encounter.  Discharge instructions Compress as needed for pain Urinalysis is negative for UTI Urine culture sent.  We will call you with abnormal results.   Push fluids and get plenty of rest.   Continue to take Tylenol 500 mg every 4-6 hours as needed for pain  May use ice to massage pain area Follow up with PCP if symptoms persists Return here or go to ER if you have any new or worsening symptoms such as fever, worsening abdominal pain, nausea/vomiting, flank pain, etc...  Outlined signs and symptoms indicating need for more acute intervention. Patient verbalized understanding. After Visit Summary given.     Emerson Monte, [place] 04/08/20 1845

## 2020-04-11 DIAGNOSIS — Z885 Allergy status to narcotic agent status: Secondary | ICD-10-CM | POA: Diagnosis not present

## 2020-04-11 DIAGNOSIS — M438X6 Other specified deforming dorsopathies, lumbar region: Secondary | ICD-10-CM | POA: Diagnosis not present

## 2020-04-11 DIAGNOSIS — M85862 Other specified disorders of bone density and structure, left lower leg: Secondary | ICD-10-CM | POA: Diagnosis not present

## 2020-04-11 DIAGNOSIS — C9000 Multiple myeloma not having achieved remission: Secondary | ICD-10-CM | POA: Diagnosis not present

## 2020-04-11 DIAGNOSIS — M1712 Unilateral primary osteoarthritis, left knee: Secondary | ICD-10-CM | POA: Diagnosis not present

## 2020-04-11 DIAGNOSIS — M21852 Other specified acquired deformities of left thigh: Secondary | ICD-10-CM | POA: Diagnosis not present

## 2020-04-11 DIAGNOSIS — Z9889 Other specified postprocedural states: Secondary | ICD-10-CM | POA: Diagnosis not present

## 2020-04-11 DIAGNOSIS — Z882 Allergy status to sulfonamides status: Secondary | ICD-10-CM | POA: Diagnosis not present

## 2020-04-11 DIAGNOSIS — D649 Anemia, unspecified: Secondary | ICD-10-CM | POA: Diagnosis not present

## 2020-04-11 DIAGNOSIS — D166 Benign neoplasm of vertebral column: Secondary | ICD-10-CM | POA: Diagnosis not present

## 2020-04-11 DIAGNOSIS — M47816 Spondylosis without myelopathy or radiculopathy, lumbar region: Secondary | ICD-10-CM | POA: Diagnosis not present

## 2020-04-11 DIAGNOSIS — R21 Rash and other nonspecific skin eruption: Secondary | ICD-10-CM | POA: Diagnosis not present

## 2020-04-11 DIAGNOSIS — M47818 Spondylosis without myelopathy or radiculopathy, sacral and sacrococcygeal region: Secondary | ICD-10-CM | POA: Diagnosis not present

## 2020-04-11 DIAGNOSIS — M160 Bilateral primary osteoarthritis of hip: Secondary | ICD-10-CM | POA: Diagnosis not present

## 2020-04-11 DIAGNOSIS — Z888 Allergy status to other drugs, medicaments and biological substances status: Secondary | ICD-10-CM | POA: Diagnosis not present

## 2020-04-11 DIAGNOSIS — R627 Adult failure to thrive: Secondary | ICD-10-CM | POA: Diagnosis not present

## 2020-04-11 DIAGNOSIS — C9002 Multiple myeloma in relapse: Secondary | ICD-10-CM | POA: Diagnosis not present

## 2020-04-13 DIAGNOSIS — D509 Iron deficiency anemia, unspecified: Secondary | ICD-10-CM | POA: Diagnosis not present

## 2020-04-13 DIAGNOSIS — B029 Zoster without complications: Secondary | ICD-10-CM | POA: Diagnosis not present

## 2020-04-13 DIAGNOSIS — G894 Chronic pain syndrome: Secondary | ICD-10-CM | POA: Diagnosis not present

## 2020-04-13 DIAGNOSIS — M353 Polymyalgia rheumatica: Secondary | ICD-10-CM | POA: Diagnosis not present

## 2020-04-13 DIAGNOSIS — E039 Hypothyroidism, unspecified: Secondary | ICD-10-CM | POA: Diagnosis not present

## 2020-04-13 DIAGNOSIS — C9000 Multiple myeloma not having achieved remission: Secondary | ICD-10-CM | POA: Diagnosis not present

## 2020-04-13 DIAGNOSIS — E559 Vitamin D deficiency, unspecified: Secondary | ICD-10-CM | POA: Diagnosis not present

## 2020-04-21 DIAGNOSIS — I10 Essential (primary) hypertension: Secondary | ICD-10-CM | POA: Diagnosis not present

## 2020-04-29 ENCOUNTER — Ambulatory Visit (INDEPENDENT_AMBULATORY_CARE_PROVIDER_SITE_OTHER): Payer: Medicare Other

## 2020-04-29 DIAGNOSIS — I480 Paroxysmal atrial fibrillation: Secondary | ICD-10-CM | POA: Diagnosis not present

## 2020-04-30 LAB — CUP PACEART REMOTE DEVICE CHECK
Battery Remaining Longevity: 35 mo
Battery Remaining Percentage: 29 %
Battery Voltage: 2.81 V
Brady Statistic AP VP Percent: 1 %
Brady Statistic AP VS Percent: 52 %
Brady Statistic AS VP Percent: 1 %
Brady Statistic AS VS Percent: 47 %
Brady Statistic RA Percent Paced: 49 %
Brady Statistic RV Percent Paced: 1 %
Date Time Interrogation Session: 20220107020016
Implantable Lead Implant Date: 20130214
Implantable Lead Implant Date: 20130214
Implantable Lead Location: 753859
Implantable Lead Location: 753860
Implantable Pulse Generator Implant Date: 20130214
Lead Channel Impedance Value: 380 Ohm
Lead Channel Impedance Value: 430 Ohm
Lead Channel Pacing Threshold Amplitude: 0.625 V
Lead Channel Pacing Threshold Amplitude: 1.25 V
Lead Channel Pacing Threshold Pulse Width: 0.5 ms
Lead Channel Pacing Threshold Pulse Width: 0.5 ms
Lead Channel Sensing Intrinsic Amplitude: 12 mV
Lead Channel Sensing Intrinsic Amplitude: 3.7 mV
Lead Channel Setting Pacing Amplitude: 1.5 V
Lead Channel Setting Pacing Amplitude: 1.625
Lead Channel Setting Pacing Pulse Width: 0.5 ms
Lead Channel Setting Sensing Sensitivity: 2 mV
Pulse Gen Model: 2210
Pulse Gen Serial Number: 7316836

## 2020-05-09 ENCOUNTER — Encounter (INDEPENDENT_AMBULATORY_CARE_PROVIDER_SITE_OTHER): Payer: Medicare Other | Admitting: Ophthalmology

## 2020-05-13 NOTE — Progress Notes (Signed)
Remote pacemaker transmission.   

## 2020-06-03 ENCOUNTER — Other Ambulatory Visit (HOSPITAL_COMMUNITY)
Admission: RE | Admit: 2020-06-03 | Discharge: 2020-06-03 | Disposition: A | Discharge status: Home / Self Care | Payer: Medicare Other | Source: Other Acute Inpatient Hospital | Attending: Family Medicine | Admitting: Family Medicine

## 2020-06-03 DIAGNOSIS — N390 Urinary tract infection, site not specified: Secondary | ICD-10-CM | POA: Diagnosis present

## 2020-06-03 LAB — URINALYSIS, COMPLETE (UACMP) WITH MICROSCOPIC
Bilirubin Urine: NEGATIVE
Glucose, UA: NEGATIVE mg/dL
Hgb urine dipstick: NEGATIVE
Ketones, ur: NEGATIVE mg/dL
Leukocytes,Ua: NEGATIVE
Nitrite: NEGATIVE
Protein, ur: 30 mg/dL — AB
Specific Gravity, Urine: 1.013 (ref 1.005–1.030)
pH: 6 (ref 5.0–8.0)

## 2020-06-04 LAB — URINE CULTURE

## 2020-07-29 ENCOUNTER — Ambulatory Visit (INDEPENDENT_AMBULATORY_CARE_PROVIDER_SITE_OTHER): Payer: Medicare Other

## 2020-07-29 DIAGNOSIS — I480 Paroxysmal atrial fibrillation: Secondary | ICD-10-CM | POA: Diagnosis not present

## 2020-07-29 LAB — CUP PACEART REMOTE DEVICE CHECK
Battery Remaining Longevity: 31 mo
Battery Remaining Percentage: 25 %
Battery Voltage: 2.8 V
Brady Statistic AP VP Percent: 1 %
Brady Statistic AP VS Percent: 48 %
Brady Statistic AS VP Percent: 1 %
Brady Statistic AS VS Percent: 52 %
Brady Statistic RA Percent Paced: 44 %
Brady Statistic RV Percent Paced: 1 %
Date Time Interrogation Session: 20220408030835
Implantable Lead Implant Date: 20130214
Implantable Lead Implant Date: 20130214
Implantable Lead Location: 753859
Implantable Lead Location: 753860
Implantable Pulse Generator Implant Date: 20130214
Lead Channel Impedance Value: 380 Ohm
Lead Channel Impedance Value: 390 Ohm
Lead Channel Pacing Threshold Amplitude: 0.625 V
Lead Channel Pacing Threshold Amplitude: 1.25 V
Lead Channel Pacing Threshold Pulse Width: 0.5 ms
Lead Channel Pacing Threshold Pulse Width: 0.5 ms
Lead Channel Sensing Intrinsic Amplitude: 12 mV
Lead Channel Sensing Intrinsic Amplitude: 3.3 mV
Lead Channel Setting Pacing Amplitude: 1.5 V
Lead Channel Setting Pacing Amplitude: 1.625
Lead Channel Setting Pacing Pulse Width: 0.5 ms
Lead Channel Setting Sensing Sensitivity: 2 mV
Pulse Gen Model: 2210
Pulse Gen Serial Number: 7316836

## 2020-08-02 ENCOUNTER — Other Ambulatory Visit (HOSPITAL_COMMUNITY)
Admission: RE | Admit: 2020-08-02 | Discharge: 2020-08-02 | Disposition: A | Discharge status: Home / Self Care | Payer: Medicare Other | Source: Other Acute Inpatient Hospital | Attending: Family Medicine | Admitting: Family Medicine

## 2020-08-02 DIAGNOSIS — N390 Urinary tract infection, site not specified: Secondary | ICD-10-CM | POA: Insufficient documentation

## 2020-08-02 LAB — URINALYSIS, ROUTINE W REFLEX MICROSCOPIC
Bilirubin Urine: NEGATIVE
Glucose, UA: NEGATIVE mg/dL
Hgb urine dipstick: NEGATIVE
Ketones, ur: NEGATIVE mg/dL
Leukocytes,Ua: NEGATIVE
Nitrite: NEGATIVE
Protein, ur: 30 mg/dL — AB
Specific Gravity, Urine: 1.012 (ref 1.005–1.030)
pH: 7 (ref 5.0–8.0)

## 2020-08-06 LAB — URINE CULTURE: Culture: 40000 — AB

## 2020-08-12 NOTE — Progress Notes (Signed)
Remote pacemaker transmission.   

## 2020-09-27 ENCOUNTER — Encounter (INDEPENDENT_AMBULATORY_CARE_PROVIDER_SITE_OTHER): Payer: Self-pay | Admitting: Ophthalmology

## 2020-09-27 ENCOUNTER — Other Ambulatory Visit: Payer: Self-pay

## 2020-09-27 ENCOUNTER — Ambulatory Visit (INDEPENDENT_AMBULATORY_CARE_PROVIDER_SITE_OTHER): Payer: Medicare Other | Admitting: Ophthalmology

## 2020-09-27 DIAGNOSIS — H353134 Nonexudative age-related macular degeneration, bilateral, advanced atrophic with subfoveal involvement: Secondary | ICD-10-CM | POA: Diagnosis not present

## 2020-09-27 DIAGNOSIS — H353212 Exudative age-related macular degeneration, right eye, with inactive choroidal neovascularization: Secondary | ICD-10-CM

## 2020-09-27 DIAGNOSIS — H353222 Exudative age-related macular degeneration, left eye, with inactive choroidal neovascularization: Secondary | ICD-10-CM

## 2020-09-27 NOTE — Progress Notes (Signed)
09/27/2020     CHIEF COMPLAINT Patient presents for Retina Follow Up (6 month fu ou and oct (had to r/s due to Covid. LOV 10/2019.)/Pt states, "My seems to be getting much worse. I do not see things as clear.")   HISTORY OF PRESENT ILLNESS: [name] is a 87 y.o. female who presents to the clinic today for:   HPI    Retina Follow Up    Diagnosis: Wet AMD   Laterality: both eyes   Onset: 11 months ago   Duration: 11 months   Course: gradually worsening   Comments: 6 month fu ou and oct (had to r/s due to Covid. LOV 10/2019.) Pt states, "My seems to be getting much worse. I do not see things as clear."       Last edited by Kendra Opitz, COA on 09/27/2020  2:46 PM. (History)      Referring physician: Celene Squibb, MD [address],  [place] [address]  HISTORICAL INFORMATION:   Selected notes from the MEDICAL RECORD NUMBER       CURRENT MEDICATIONS: Current Outpatient Medications (Ophthalmic Drugs)  Medication Sig  . cycloSPORINE (RESTASIS) 0.05 % ophthalmic emulsion Place 1 drop into both eyes 2 (two) times daily.  . Olopatadine HCl (PATADAY) 0.2 % SOLN Place 1 drop into both eyes daily.   No current facility-administered medications for this visit. (Ophthalmic Drugs)   Current Outpatient Medications (Other)  Medication Sig  . acetaminophen (TYLENOL) 500 MG tablet Take 1,000 mg by mouth every 6 (six) hours as needed for mild pain.   Marland Kitchen acyclovir (ZOVIRAX) 400 MG tablet Take 400 mg by mouth 2 (two) times daily.  Marland Kitchen ALPRAZolam (XANAX) 0.25 MG tablet Take 0.25 mg by mouth at bedtime.   Marland Kitchen amLODipine (NORVASC) 5 MG tablet Take 5 mg by mouth at bedtime.   Marland Kitchen aspirin 81 MG tablet Take 81 mg by mouth every evening.   . Calcium Carbonate-Vitamin D (CALCIUM 600+D) 600-400 MG-UNIT tablet Take 1 tablet by mouth 2 (two) times daily.  . carvedilol (COREG) 6.25 MG tablet Take 1 tablet (6.25 mg total) by mouth 2 (two) times daily with a meal.  . cetaphil (CETAPHIL) cream  Apply 1 application topically 2 (two) times daily. DAY AND EVENING SHIFT FOR DRY SKIN  . diphenhydrAMINE (BENADRYL) 25 MG tablet Take 25 mg by mouth every 6 (six) hours as needed for itching or allergies.  Marland Kitchen docusate sodium (COLACE) 100 MG capsule Take 100 mg by mouth 2 (two) times daily. For constipation  . DULoxetine (CYMBALTA) 20 MG capsule Take 1 capsule by mouth every morning.  . fexofenadine (ALLEGRA) 180 MG tablet Take 180 mg by mouth every morning.  . fluticasone (FLONASE) 50 MCG/ACT nasal spray Place 1 spray into both nostrils daily as needed for allergies or rhinitis.  . hydrALAZINE (APRESOLINE) 25 MG tablet Take 25 mg by mouth 2 (two) times daily.  Marland Kitchen HYDROcodone-acetaminophen (NORCO/VICODIN) 5-325 MG tablet Take 1 tablet by mouth every 4 (four) hours as needed.  . hydrocortisone (ANUSOL-HC) 2.5 % rectal cream Place 1 application rectally 2 (two) times daily as needed for hemorrhoids or anal itching.  . hydrocortisone cream 0.5 % Apply 1 application topically 2 (two) times daily as needed for itching (for rash).  . Hypertonic Nasal Wash (SINUS RINSE NA) Place into the nose daily as needed (for sinusitis).  Marland Kitchen levothyroxine (SYNTHROID, LEVOTHROID) 75 MCG tablet Take 75 mcg by mouth daily before breakfast.   .  loperamide (IMODIUM) 2 MG capsule Take 2 mg by mouth every 4 (four) hours as needed for diarrhea or loose stools.  . magnesium hydroxide (MILK OF MAGNESIA) 400 MG/5ML suspension Take by mouth 2 (two) times daily as needed for mild constipation (0.20ms administered as needed).  . meclizine (ANTIVERT) 25 MG tablet Take 25 mg by mouth every 6 (six) hours as needed for dizziness.  . meloxicam (MOBIC) 15 MG tablet Take 15 mg by mouth daily.  . mineral oil liquid Place in ear(s) every Thursday. 2 drops in both ears once daily on Thursdays for Cerumen Impaction  . multivitamin-lutein (OCUVITE-LUTEIN) CAPS capsule Take 1 capsule by mouth 2 (two) times daily.  . ondansetron (ZOFRAN) 8 MG tablet  Take 8 mg by mouth every 8 (eight) hours as needed for nausea or vomiting.  .Marland Kitchenoxycodone (OXY-IR) 5 MG capsule Take 5 mg by mouth every 4 (four) hours as needed for pain.  . pantoprazole (PROTONIX) 40 MG tablet Take 40 mg by mouth daily.  . polyethylene glycol (MIRALAX / GLYCOLAX) packet Take 17 g by mouth daily.  . predniSONE (DELTASONE) 5 MG tablet Take 5 mg by mouth daily.  . prochlorperazine (COMPAZINE) 10 MG tablet Take 10 mg by mouth every 6 (six) hours as needed for nausea or vomiting.  . senna (SENOKOT) 8.6 MG tablet Take 1 tablet by mouth 2 (two) times daily.  .Marland KitchenZYRTEC ALLERGY 10 MG tablet Take 10 mg by mouth daily.   No current facility-administered medications for this visit. (Other)      REVIEW OF SYSTEMS:    ALLERGIES Allergies  Allergen Reactions  . Tramadol Itching and Nausea Only  . Sulfa Antibiotics Nausea And Vomiting    PAST MEDICAL HISTORY Past Medical History:  Diagnosis Date  . Anemia   . Bradycardia   . Chronic back pain   . Constipation   . Degenerative joint disease   . Diverticulosis   . External hemorrhoids   . GERD (gastroesophageal reflux disease)   . Goiter, unspecified   . Hyperlipidemia   . Hypertension   . Hypothyroidism   . Macular degeneration   . [place] 03/12[place]  . Osteoporosis   . Pacemaker   . Palpitations   . [place]   . Seasonal allergies   . Thyrotoxicosis without mention of goiter or other cause, without mention of thyrotoxic crisis or storm    Past Surgical History:  Procedure Laterality Date  . COLONOSCOPY  November 2011   External hemorrhoids, diverticulosis, anal papillae  . COLONOSCOPY N/A 04/01/2014   Procedure: COLONOSCOPY;  Surgeon: NRogene Houston MD;  [facility];  Service: Endoscopy;  Laterality: N/A;  930  . FEMUR IM NAIL Left 12/29/2012   Procedure: INTRAMEDULLARY (IM) NAIL INTERTROCHANTRIC;  Surgeon: DMelina Schools MD;  [place]  Service:  Orthopedics;  Laterality: Left;  . INSERT / REPLACE / REMOVE PACEMAKER    . PERMANENT PACEMAKER INSERTION N/A 06/07/2011   Procedure: PERMANENT PACEMAKER INSERTION;  Surgeon: GEvans Lance MD;  [facility];  Service: Cardiovascular;  Laterality: N/A;  . THYROIDECTOMY    . TONSILLECTOMY    . vertebroplasty      FAMILY HISTORY Family History  Problem Relation Age of Onset  . Heart attack Mother        Deceased  . Cancer Father        Deceased, colon cancer age 85 . Cancer Brother  Deceased, throat and lung    SOCIAL HISTORY Social History   Tobacco Use  . Smoking status: Never Smoker  . Smokeless tobacco: Never Used  Vaping Use  . Vaping Use: Never used  Substance Use Topics  . Alcohol use: No  . Drug use: No         OPHTHALMIC EXAM:  Base Eye Exam    Visual Acuity (ETDRS)      Right Left   Dist [place] HM 20/400 ecc   Dist ph cc  NI       Tonometry (Tonopen, 2:49 PM)      Right Left   Pressure 14 14       Pupils      Pupils Dark Light Shape React APD   Right PERRL 5 4 Round Slow None   Left PERRL 5 4 Round Slow None       Visual Fields      Left Right   Restrictions Total superior temporal, inferior temporal deficiencies Total superior nasal deficiency       Extraocular Movement      Right Left    Full Full       Neuro/Psych    Oriented x3: Yes   Mood/Affect: Normal       Dilation    Both eyes: 1.0% Mydriacyl, 2.5% Phenylephrine @ 2:49 PM        Slit Lamp and Fundus Exam    External Exam      Right Left   External Normal Normal       Slit Lamp Exam      Right Left   Lids/Lashes Normal Normal   Conjunctiva/Sclera White and quiet White and quiet   Cornea Clear Clear   Anterior Chamber Deep and quiet Deep and quiet   Iris Round and reactive Round and reactive   Lens Posterior chamber intraocular lens Posterior chamber intraocular lens, appears centered, yet with large partial Sommering's ring fragment,  Now in the  visual axis from temporal portion of bag, 4+ Posterior capsular opacification   Anterior Vitreous Normal Normal       Fundus Exam      Right Left   Posterior Vitreous Normal Normal   Disc Normal Normal   C/D Ratio 0.1 0.15   Macula Exudates, Disciform scar massive Disciform scar,,   Vessels Normal Normal   Periphery Normal Normal          IMAGING AND PROCEDURES  Imaging and Procedures for 09/27/20  OCT, Retina - OU - Both Eyes       Right Eye Quality was good. Scan locations included subfoveal. Central Foveal Thickness: 897. Findings include abnormal foveal contour, choroidal neovascular membrane, cystoid macular edema, subretinal scarring, disciform scar.   Left Eye Quality was good. Scan locations included subfoveal. Central Foveal Thickness: 337. Findings include abnormal foveal contour, disciform scar, subretinal scarring, epiretinal membrane.   Notes Massive subretinal disciform scar OD, stable over the last 10 years.  No change overall.  The left eye similarly with prior disciform macular scar coincident with Epiretinal membrane as well as pigment epithelial detachment.  Subfoveal scarring of the vision in the left eye.  Medial opacity is severe.                ASSESSMENT/PLAN:  Exudative age-related macular degeneration of left eye with inactive choroidal neovascularization (HCC) No current active CNVM  Exudative age-related macular degeneration of right eye with inactive choroidal neovascularization (HCC) The nature of age realated macular  degeneration (ARMD)is explained as follows: The dry form refers to the progressive loss of normal blood supply to the central vision as a result of a combination of factors which include aging blood supply (arteriosclerosis, hardening of the arteries), genetics, smoking habits, and history of hypertension. Currently, no eye medications or vitamins slow this type of aging effect upon vision, however cessation of smoking and  controlling hypertension help slow the disorder. The following analogy helps explain this: I describe the dry form of ARMD like a house of the same age as your eyes, which shows typical wear and tear of age upon the house structure and appearance. Like the aging house which can fall down structurally, the dry form of ARMD can deteriorate the structure of the macula (center) of the retina, most often gradually and affect central vision tasks such as reading and driving. The wet form of ARMD refers to the development of abnormally growing blood vessels usually near or under the central vision, with potential risk of permanent visual changes or vision losses. This complication of the Dry form of ARMD may be moderately reduced by use of AREDS 2 formula multivitamins daily. I describe the Wet form of ARMD as like the development of a fire in an aging house (DRY ARMD). It may develop suddenly, progress rapidly and be destructive based on where it starts and how big the fire is when found. In the eye, the house fire analogy refers to the abnormal blood vessels growing destructively near the central vison in the retina, or film of the eye. Halting the growth of blood vessels with laser (hot or cold) or injectable medications is best way "to put the fire out". Many patients will experience a stabilization or even improvement in vision with a treatment course, while others may still face a loss of vision. The use of injectable medications has revolutionized therapy and is currently the only proven therapy to provide the chance of stable or improved acuity from new and recent destructive wet ARMD.No active lesion      ICD-10-CM   1. Advanced nonexudative age-related macular degeneration of both eyes with subfoveal involvement  H35.3134 OCT, Retina - OU - Both Eyes  2. Exudative age-related macular degeneration of left eye with inactive choroidal neovascularization ([place])  H35.3222   3. Exudative age-related macular  degeneration of right eye with inactive choroidal neovascularization ([place])  H35.3212     1.  No signs of active disease.  2.  Acuity left eye slightly gradually worse over time secondary to atrophic changes progressive but no signs of active CNVM  3.  House fire stories analogy was reviewed with the family and the patient  Ophthalmic Meds Ordered this visit:  No orders of the defined types were placed in this encounter.      Return in about 1 year (around 09/27/2021) for DILATE OU, OCT.  There are no Patient Instructions on file for this visit.   Explained the diagnoses, plan, and follow up with the patient and they expressed understanding.  Patient expressed understanding of the importance of proper follow up care.   Clent Demark Jamarri Vuncannon M.D. Diseases & Surgery of the Retina and Vitreous Retina & Diabetic [place] 09/27/20     Abbreviations: M myopia (nearsighted); A astigmatism; H hyperopia (farsighted); P presbyopia; Mrx spectacle prescription;  CTL contact lenses; OD right eye; OS left eye; OU both eyes  XT exotropia; ET esotropia; PEK punctate epithelial keratitis; PEE punctate epithelial erosions; DES dry eye syndrome; MGD meibomian  gland dysfunction; ATs artificial tears; PFAT's preservative free artificial tears; Parc nuclear sclerotic cataract; PSC posterior subcapsular cataract; ERM epi-retinal membrane; PVD posterior vitreous detachment; RD retinal detachment; DM diabetes mellitus; DR diabetic retinopathy; NPDR non-proliferative diabetic retinopathy; PDR proliferative diabetic retinopathy; CSME clinically significant macular edema; DME diabetic macular edema; dbh dot blot hemorrhages; CWS cotton wool spot; POAG primary open angle glaucoma; C/D cup-to-disc ratio; HVF humphrey visual field; GVF goldmann visual field; OCT optical coherence tomography; IOP intraocular pressure; BRVO Branch retinal vein occlusion; CRVO central retinal vein occlusion; CRAO central retinal artery  occlusion; BRAO branch retinal artery occlusion; RT retinal tear; SB scleral buckle; PPV pars plana vitrectomy; VH Vitreous hemorrhage; PRP panretinal laser photocoagulation; IVK intravitreal kenalog; VMT vitreomacular traction; MH Macular hole;  NVD neovascularization of the disc; NVE neovascularization elsewhere; AREDS age related eye disease study; ARMD age related macular degeneration; POAG primary open angle glaucoma; EBMD epithelial/anterior basement membrane dystrophy; ACIOL anterior chamber intraocular lens; IOL intraocular lens; PCIOL posterior chamber intraocular lens; Phaco/IOL phacoemulsification with intraocular lens placement; Hardy photorefractive keratectomy; LASIK laser assisted in situ keratomileusis; HTN hypertension; DM diabetes mellitus; COPD chronic obstructive pulmonary disease

## 2020-09-27 NOTE — Assessment & Plan Note (Signed)
No current active CNVM

## 2020-09-27 NOTE — Assessment & Plan Note (Addendum)
The nature of age realated macular degeneration (ARMD)is explained as follows: The dry form refers to the progressive loss of normal blood supply to the central vision as a result of a combination of factors which include aging blood supply (arteriosclerosis, hardening of the arteries), genetics, smoking habits, and history of hypertension. Currently, no eye medications or vitamins slow this type of aging effect upon vision, however cessation of smoking and controlling hypertension help slow the disorder. The following analogy helps explain this: I describe the dry form of ARMD like a house of the same age as your eyes, which shows typical wear and tear of age upon the house structure and appearance. Like the aging house which can fall down structurally, the dry form of ARMD can deteriorate the structure of the macula (center) of the retina, most often gradually and affect central vision tasks such as reading and driving. The wet form of ARMD refers to the development of abnormally growing blood vessels usually near or under the central vision, with potential risk of permanent visual changes or vision losses. This complication of the Dry form of ARMD may be moderately reduced by use of AREDS 2 formula multivitamins daily. I describe the Wet form of ARMD as like the development of a fire in an aging house (DRY ARMD). It may develop suddenly, progress rapidly and be destructive based on where it starts and how big the fire is when found. In the eye, the house fire analogy refers to the abnormal blood vessels growing destructively near the central vison in the retina, or film of the eye. Halting the growth of blood vessels with laser (hot or cold) or injectable medications is best way "to put the fire out". Many patients will experience a stabilization or even improvement in vision with a treatment course, while others may still face a loss of vision. The use of injectable medications has revolutionized therapy and is  currently the only proven therapy to provide the chance of stable or improved acuity from new and recent destructive wet ARMD.No active lesion

## 2020-10-28 ENCOUNTER — Ambulatory Visit (INDEPENDENT_AMBULATORY_CARE_PROVIDER_SITE_OTHER): Payer: Medicare Other

## 2020-10-28 DIAGNOSIS — I480 Paroxysmal atrial fibrillation: Secondary | ICD-10-CM

## 2020-10-30 LAB — CUP PACEART REMOTE DEVICE CHECK
Battery Remaining Longevity: 10 mo
Battery Remaining Percentage: 8 %
Battery Voltage: 2.78 V
Brady Statistic AP VP Percent: 1 %
Brady Statistic AP VS Percent: 41 %
Brady Statistic AS VP Percent: 1 %
Brady Statistic AS VS Percent: 59 %
Brady Statistic RA Percent Paced: 38 %
Brady Statistic RV Percent Paced: 1 %
Date Time Interrogation Session: 20220708020016
Implantable Lead Implant Date: 20130214
Implantable Lead Implant Date: 20130214
Implantable Lead Location: 753859
Implantable Lead Location: 753860
Implantable Pulse Generator Implant Date: 20130214
Lead Channel Impedance Value: 390 Ohm
Lead Channel Impedance Value: 410 Ohm
Lead Channel Pacing Threshold Amplitude: 0.625 V
Lead Channel Pacing Threshold Amplitude: 1.25 V
Lead Channel Pacing Threshold Pulse Width: 0.5 ms
Lead Channel Pacing Threshold Pulse Width: 0.5 ms
Lead Channel Sensing Intrinsic Amplitude: 12 mV
Lead Channel Sensing Intrinsic Amplitude: 3.4 mV
Lead Channel Setting Pacing Amplitude: 1.5 V
Lead Channel Setting Pacing Amplitude: 1.625
Lead Channel Setting Pacing Pulse Width: 0.5 ms
Lead Channel Setting Sensing Sensitivity: 2 mV
Pulse Gen Model: 2210
Pulse Gen Serial Number: 7316836

## 2020-11-18 NOTE — Progress Notes (Signed)
Remote pacemaker transmission.   

## 2020-12-27 ENCOUNTER — Encounter (HOSPITAL_COMMUNITY): Payer: Self-pay

## 2020-12-27 ENCOUNTER — Emergency Department (HOSPITAL_COMMUNITY): Payer: Medicare Other

## 2020-12-27 ENCOUNTER — Other Ambulatory Visit: Payer: Self-pay

## 2020-12-27 ENCOUNTER — Emergency Department (HOSPITAL_COMMUNITY)
Admission: EM | Admit: 2020-12-27 | Discharge: 2020-12-27 | Disposition: A | Discharge status: Other Acute Inpatient Hospital | Payer: Medicare Other | Attending: Emergency Medicine | Admitting: Emergency Medicine

## 2020-12-27 DIAGNOSIS — E871 Hypo-osmolality and hyponatremia: Secondary | ICD-10-CM | POA: Insufficient documentation

## 2020-12-27 DIAGNOSIS — Z7982 Long term (current) use of aspirin: Secondary | ICD-10-CM | POA: Diagnosis not present

## 2020-12-27 DIAGNOSIS — Z79899 Other long term (current) drug therapy: Secondary | ICD-10-CM | POA: Insufficient documentation

## 2020-12-27 DIAGNOSIS — Z950 Presence of cardiac pacemaker: Secondary | ICD-10-CM | POA: Insufficient documentation

## 2020-12-27 DIAGNOSIS — R55 Syncope and collapse: Secondary | ICD-10-CM | POA: Diagnosis present

## 2020-12-27 DIAGNOSIS — D649 Anemia, unspecified: Secondary | ICD-10-CM | POA: Insufficient documentation

## 2020-12-27 DIAGNOSIS — E039 Hypothyroidism, unspecified: Secondary | ICD-10-CM | POA: Insufficient documentation

## 2020-12-27 DIAGNOSIS — I10 Essential (primary) hypertension: Secondary | ICD-10-CM | POA: Diagnosis not present

## 2020-12-27 DIAGNOSIS — E860 Dehydration: Secondary | ICD-10-CM | POA: Diagnosis not present

## 2020-12-27 DIAGNOSIS — Z20822 Contact with and (suspected) exposure to covid-19: Secondary | ICD-10-CM | POA: Insufficient documentation

## 2020-12-27 DIAGNOSIS — Z8579 Personal history of other malignant neoplasms of lymphoid, hematopoietic and related tissues: Secondary | ICD-10-CM | POA: Insufficient documentation

## 2020-12-27 LAB — CBC WITH DIFFERENTIAL/PLATELET
Abs Immature Granulocytes: 0.04 10*3/uL (ref 0.00–0.07)
Basophils Absolute: 0 10*3/uL (ref 0.0–0.1)
Basophils Relative: 0 %
Eosinophils Absolute: 0 10*3/uL (ref 0.0–0.5)
Eosinophils Relative: 0 %
HCT: 25.1 % — ABNORMAL LOW (ref 36.0–46.0)
Hemoglobin: 8.4 g/dL — ABNORMAL LOW (ref 12.0–15.0)
Immature Granulocytes: 1 %
Lymphocytes Relative: 16 %
Lymphs Abs: 1.3 10*3/uL (ref 0.7–4.0)
MCH: 36.4 pg — ABNORMAL HIGH (ref 26.0–34.0)
MCHC: 33.5 g/dL (ref 30.0–36.0)
MCV: 108.7 fL — ABNORMAL HIGH (ref 80.0–100.0)
Monocytes Absolute: 1 10*3/uL (ref 0.1–1.0)
Monocytes Relative: 12 %
Neutro Abs: 5.8 10*3/uL (ref 1.7–7.7)
Neutrophils Relative %: 71 %
Platelets: 275 10*3/uL (ref 150–400)
RBC: 2.31 MIL/uL — ABNORMAL LOW (ref 3.87–5.11)
RDW: 17.6 % — ABNORMAL HIGH (ref 11.5–15.5)
WBC: 8.1 10*3/uL (ref 4.0–10.5)
nRBC: 0 % (ref 0.0–0.2)

## 2020-12-27 LAB — URINALYSIS, ROUTINE W REFLEX MICROSCOPIC
Bilirubin Urine: NEGATIVE
Glucose, UA: NEGATIVE mg/dL
Hgb urine dipstick: NEGATIVE
Ketones, ur: NEGATIVE mg/dL
Leukocytes,Ua: NEGATIVE
Nitrite: NEGATIVE
Protein, ur: NEGATIVE mg/dL
Specific Gravity, Urine: 1.015 (ref 1.005–1.030)
pH: 7.5 (ref 5.0–8.0)

## 2020-12-27 LAB — COMPREHENSIVE METABOLIC PANEL
ALT: 11 U/L (ref 0–44)
AST: 12 U/L — ABNORMAL LOW (ref 15–41)
Albumin: 2.9 g/dL — ABNORMAL LOW (ref 3.5–5.0)
Alkaline Phosphatase: 28 U/L — ABNORMAL LOW (ref 38–126)
Anion gap: 8 (ref 5–15)
BUN: 19 mg/dL (ref 8–23)
CO2: 27 mmol/L (ref 22–32)
Calcium: 9.2 mg/dL (ref 8.9–10.3)
Chloride: 94 mmol/L — ABNORMAL LOW (ref 98–111)
Creatinine, Ser: 1.12 mg/dL — ABNORMAL HIGH (ref 0.44–1.00)
GFR, Estimated: 45 mL/min — ABNORMAL LOW (ref >60)
Glucose, Bld: 124 mg/dL — ABNORMAL HIGH (ref 70–99)
Potassium: 4.4 mmol/L (ref 3.5–5.1)
Sodium: 129 mmol/L — ABNORMAL LOW (ref 135–145)
Total Bilirubin: 0.5 mg/dL (ref 0.3–1.2)
Total Protein: 6.9 g/dL (ref 6.5–8.1)

## 2020-12-27 LAB — RESP PANEL BY RT-PCR (FLU A&B, COVID) ARPGX2
Influenza A by PCR: NEGATIVE
Influenza B by PCR: NEGATIVE
SARS Coronavirus 2 by RT PCR: NEGATIVE

## 2020-12-27 LAB — TROPONIN I (HIGH SENSITIVITY)
Troponin I (High Sensitivity): 12 ng/L (ref <18)
Troponin I (High Sensitivity): 10 ng/L (ref <18)

## 2020-12-27 MED ORDER — SODIUM CHLORIDE 0.9 % IV BOLUS
500.0000 mL | Freq: Once | INTRAVENOUS | Status: AC
  Administered 2020-12-27: 500 mL via INTRAVENOUS

## 2020-12-27 NOTE — ED Notes (Signed)
Checked on pt to redo bp. BPwas 187/90

## 2020-12-27 NOTE — ED Triage Notes (Signed)
Pt to er room number 7, pt states that she is here because they said she passed out.  Per ems pt passed out in her wheel chair and was aroused with some tactile stimulation, pt awake at this time, pt oriented to person, place and event, pt doesn't know the day of the week.  Pt states that she lives at [place].

## 2020-12-28 NOTE — ED Provider Notes (Signed)
[facility] Provider Note   CSN: [id number] Arrival date & time: 12/27/20  1116     History Chief Complaint  Patient presents with   Loss of Consciousness    [name] is a 87 y.o. female.   Loss of Consciousness Associated symptoms: headaches   Associated symptoms: no chest pain, no confusion, no fever and no shortness of breath   Patient presents after reported syncopal episode.  Reportedly was sitting in wheelchair and passed out.  Reportedly some decreased arousability.  Now much more awake.  Lives in [place].  Has done this previously.  More history comes from patient later and once patient's son arrives.  Somewhat hard of hearing somewhat difficult to get history from.  Reportedly has been eating and drinking less.  No chest pain.  No trouble breathing.  Has passed out like this previously when she has been dehydrated or has a UTI.  May have somewhat decreased oral intake overall.  Per the son the food at [employer] is not that good.    Past Medical History:  Diagnosis Date   Anemia    Bradycardia    Chronic back pain    Constipation    Degenerative joint disease    Diverticulosis    External hemorrhoids    GERD (gastroesophageal reflux disease)    Goiter, unspecified    Hyperlipidemia    Hypertension    Hypothyroidism    Macular degeneration    Multiple myeloma ([place]) 03/12/2016   Osteoporosis    Pacemaker    Palpitations    Polymyalgia rheumatica (HCC)    Seasonal allergies    Thyrotoxicosis without mention of goiter or other cause, without mention of thyrotoxic crisis or storm     Patient Active Problem List   Diagnosis Date Noted   Exudative age-related macular degeneration of right eye with inactive choroidal neovascularization ([place]) 09/27/2020   Advanced nonexudative age-related macular degeneration of both eyes with subfoveal involvement 11/05/2019   Exudative age-related macular degeneration of left eye with inactive choroidal  neovascularization ([place]) 11/05/2019   PCO (posterior capsular opacification), left 11/05/2019   Chronic back pain 07/04/2017   Seasonal allergies 07/04/2017   Degenerative joint disease 07/04/2017   Influenza A 07/03/2017   GERD (gastroesophageal reflux disease) 07/03/2017   Polymyalgia rheumatica ([place]) 07/03/2017   Hypokalemia 07/03/2017   Multiple myeloma ([place]) 03/12/2016   Thoracic spine fracture ([place]) 06/30/2015   Anemia 11/22/2014   Dehydration 11/22/2014   Fracture, humerus, proximal 11/21/2014   Fall 11/21/2014   Closed left hip fracture ([place]) 12/27/2012   Hyponatremia 12/27/2012   Dysphagia 10/16/2011   Constipation 10/16/2011   Pacemaker 09/03/2011   Gastroenteritis 07/21/2011   Hypothyroidism 06/04/2011   Chest tightness 06/03/2011   Bradycardia 06/03/2011   Hypertension 06/03/2011   Acute renal insufficiency 06/03/2011   Epigastric abdominal tenderness 06/03/2011   HYPERLIPIDEMIA, FAMILIAL 01/03/2010   ANEMIA 01/03/2010   BRADYARRHYTHMIA 01/03/2010   PALPITATIONS 01/03/2010    Past Surgical History:  Procedure Laterality Date   COLONOSCOPY  November 2011   External hemorrhoids, diverticulosis, anal papillae   COLONOSCOPY N/A 04/01/2014   Procedure: COLONOSCOPY;  Surgeon: Rogene Houston, MD;  [facility];  Service: Endoscopy;  Laterality: N/A;  930   FEMUR IM NAIL Left 12/29/2012   Procedure: INTRAMEDULLARY (IM) NAIL INTERTROCHANTRIC;  Surgeon: Melina Schools, MD;  Location: [facility];  Service: Orthopedics;  Laterality: Left;   INSERT / REPLACE / [place]  INSERTION N/A 06/07/2011   Procedure: PERMANENT PACEMAKER INSERTION;  Surgeon: Evans Lance, MD;  Location: [facility] LAB;  Service: Cardiovascular;  Laterality: N/A;   THYROIDECTOMY     TONSILLECTOMY     vertebroplasty       OB History   No obstetric history on file.     Family History  Problem Relation Age of Onset   Heart attack Mother        Deceased    Cancer Father        Deceased, colon cancer age 81   Cancer Brother        Deceased, throat and lung    Social History   Tobacco Use   Smoking status: Never   Smokeless tobacco: Never  Vaping Use   Vaping Use: Never used  Substance Use Topics   Alcohol use: No   Drug use: No    Home Medications Prior to Admission medications   Medication Sig Start Date End Date Taking? Authorizing Provider  acetaminophen (TYLENOL) 500 MG tablet Take 1,000 mg by mouth every 6 (six) hours as needed for mild pain.    Yes [provider]  ALPRAZolam (XANAX) 0.25 MG tablet Take 0.25 mg by mouth at bedtime.   Yes [provider]  amLODipine (NORVASC) 5 MG tablet Take 5 mg by mouth at bedtime.  08/11/14  Yes [provider]  aspirin 81 MG tablet Take 81 mg by mouth every evening.    Yes [provider]  Calcium Carbonate-Vitamin D 600-400 MG-UNIT tablet Take 1 tablet by mouth 2 (two) times daily.   Yes [provider]  carvedilol (COREG) 6.25 MG tablet Take 1 tablet (6.25 mg total) by mouth 2 (two) times daily with a meal. 07/02/15  Yes Thurnell Lose, MD  Cholecalciferol (VITAMIN D-3) 125 MCG (5000 UT) TABS Take 1 tablet by mouth daily.   Yes [provider]  cycloSPORINE (RESTASIS) 0.05 % ophthalmic emulsion Place 1 drop into both eyes 2 (two) times daily.   Yes [provider]  DEXAMETHASONE PO Take 12 mg by mouth once a week. Every Wednesday   Yes [provider]  docusate sodium (COLACE) 100 MG capsule Take 100 mg by mouth at bedtime. For constipation 01/01/13  Yes Rai, Ripudeep K, MD  DULoxetine (CYMBALTA) 20 MG capsule Take 1 capsule by mouth every morning. 09/10/16  Yes [provider]  fluticasone (FLONASE) 50 MCG/ACT nasal spray Place 1 spray into both nostrils daily as needed for allergies or rhinitis.   Yes [provider]  hydrALAZINE (APRESOLINE) 25 MG tablet Take 25 mg by mouth 2 (two) times daily.  06/18/19  Yes [provider]  HYDROcodone-acetaminophen (NORCO/VICODIN) 5-325 MG tablet Take 1 tablet by mouth every 4 (four) hours as needed for moderate pain. 11/19/17  Yes [provider]  hydrocortisone (ANUSOL-HC) 2.5 % rectal cream Place 1 application rectally 2 (two) times daily as needed for hemorrhoids or anal itching.   Yes [provider]  Hypertonic Nasal Wash (SINUS RINSE NA) Place into the nose daily as needed (for sinusitis).   Yes [provider]  Iron-FA-B Cmp-C-Biot-Probiotic (FUSION PLUS) CAPS Take 1 capsule by mouth daily.   Yes [provider]  levothyroxine (SYNTHROID) 88 MCG tablet Take 88 mcg by mouth daily before breakfast.   Yes [provider]  loperamide (IMODIUM) 2 MG capsule Take 2 mg by mouth every 4 (four) hours as needed for diarrhea or loose stools.   Yes  [provider]  meclizine (ANTIVERT) 25 MG tablet Take 25 mg by mouth every 6 (six) hours as needed for dizziness.   Yes [provider]  melatonin 3 MG TABS tablet Take 3 mg by mouth at bedtime.   Yes [provider]  mirabegron ER (MYRBETRIQ) 25 MG TB24 tablet Take 25 mg by mouth daily.   Yes [provider]  montelukast (SINGULAIR) 10 MG tablet Take 10 mg by mouth at bedtime.   Yes [provider]  multivitamin-lutein (OCUVITE-LUTEIN) CAPS capsule Take 1 capsule by mouth 2 (two) times daily.   Yes [provider]  ondansetron (ZOFRAN) 8 MG tablet Take 8 mg by mouth every 8 (eight) hours as needed for nausea or vomiting.   Yes [provider]  pantoprazole (PROTONIX) 40 MG tablet Take 40 mg by mouth daily.   Yes [provider]  Polyethyl Glycol-Propyl Glycol (SYSTANE) 0.4-0.3 % SOLN Apply 1 drop to eye daily as needed (dry eye).   Yes [provider]  polyethylene glycol (MIRALAX / GLYCOLAX) packet Take 17 g by mouth daily.   Yes [provider]  predniSONE (DELTASONE) 5  MG tablet Take 5 mg by mouth daily. 07/16/19  Yes [provider]  REVLIMID 2.5 MG capsule Take 1 capsule by mouth every other day. 1 capsule in the am every other day for multiple myeloma for 21 days then rest for 7 days 12/12/20  Yes [provider]  senna (SENOKOT) 8.6 MG tablet Take 1 tablet by mouth 2 (two) times daily.   Yes [provider]  vitamin B-12 (CYANOCOBALAMIN) 500 MCG tablet Take 500 mcg by mouth daily.   Yes [provider]  ZYRTEC ALLERGY 10 MG tablet Take 10 mg by mouth daily. 05/20/19  Yes [provider]    Allergies    Tramadol and Sulfa antibiotics  Review of Systems   Review of Systems  Constitutional:  Positive for appetite change. Negative for fever.  HENT:  Negative for congestion.   Respiratory:  Negative for shortness of breath.   Cardiovascular:  Positive for syncope. Negative for chest pain.  Gastrointestinal:  Negative for abdominal pain.  Genitourinary:  Negative for flank pain.  Musculoskeletal:  Negative for back pain.  Neurological:  Positive for syncope and headaches.  Psychiatric/Behavioral:  Negative for confusion.    Physical Exam Updated Vital Signs BP (!) 161/61 (BP Location: Right Arm) Comment: Simultaneous filing. User may not have seen previous data.  Pulse 76 Comment: Simultaneous filing. User may not have seen previous data.  Temp 98.6 F (37 C) (Oral)   Resp 20   Ht 5' 3"  (1.6 m)   Wt 65.8 kg   SpO2 96% Comment: Simultaneous filing. User may not have seen previous data.  BMI 25.69 kg/m   Physical Exam Vitals and nursing note reviewed.  Constitutional:      Appearance: Normal appearance.  HENT:     Head: Atraumatic.  Eyes:     General: No scleral icterus. Cardiovascular:     Rate and Rhythm: Regular rhythm.  Pulmonary:     Breath sounds: No wheezing or rhonchi.  Abdominal:     Tenderness: There is no abdominal tenderness.  Musculoskeletal:        General: No swelling or  tenderness.     Cervical back: Neck supple.  Skin:    Findings: No rash.  Neurological:     Mental Status: She is alert. Mental status is at baseline.  Psychiatric:  Mood and Affect: Mood normal.    ED Results / Procedures / Treatments   Labs (all labs ordered are listed, but only abnormal results are displayed) Labs Reviewed  CBC WITH DIFFERENTIAL/PLATELET - Abnormal; Notable for the following components:      Result Value   RBC 2.31 (*)    Hemoglobin 8.4 (*)    HCT 25.1 (*)    MCV 108.7 (*)    MCH 36.4 (*)    RDW 17.6 (*)    All other components within normal limits  COMPREHENSIVE METABOLIC PANEL - Abnormal; Notable for the following components:   Sodium 129 (*)    Chloride 94 (*)    Glucose, Bld 124 (*)    Creatinine, Ser 1.12 (*)    Albumin 2.9 (*)    AST 12 (*)    Alkaline Phosphatase 28 (*)    GFR, Estimated 45 (*)    All other components within normal limits  RESP PANEL BY RT-PCR (FLU A&B, COVID) [id number]  URINALYSIS, ROUTINE W REFLEX MICROSCOPIC  TROPONIN I (HIGH SENSITIVITY)  TROPONIN I (HIGH SENSITIVITY)    EKG EKG Interpretation  Date/Time:  Tuesday December 27 2020 11:33:45 EDT Ventricular Rate:  62 PR Interval:    QRS Duration: 94 QT Interval:  408 QTC Calculation: 415 R Axis:   58 Text Interpretation: Sinus rhythm Probable LVH with secondary repol abnrm Confirmed by Davonna Belling [phone] on 12/27/2020 2:35:58 PM  Radiology DG Chest Portable 1 View  Result Date: 12/27/2020 CLINICAL DATA:  Weakness and syncope EXAM: PORTABLE CHEST 1 VIEW COMPARISON:  08/09/2019 FINDINGS: Pacer with leads at right atrium and right ventricle. No lead discontinuity. Midline trachea. Mild cardiomegaly. Atherosclerosis in the transverse aorta. No pleural effusion or pneumothorax. No congestive failure. No lobar consolidation. Osteopenia. Vertebral augmentation at multiple midthoracic levels. Remote right rib fractures. Proximal left humerus remote fracture.  IMPRESSION: Cardiomegaly without congestive failure. Aortic Atherosclerosis (ICD10-I70.0). Electronically Signed   By: Abigail Miyamoto M.D.   On: 12/27/2020 13:14    Procedures Procedures   Medications Ordered in ED Medications  sodium chloride 0.9 % bolus 500 mL (0 mLs Intravenous Stopped 12/27/20 1551)    ED Course  I have reviewed the triage vital signs and the nursing notes.  Pertinent labs & imaging results that were available during my care of the patient were reviewed by me and considered in my medical decision making (see chart for details).    MDM Rules/Calculators/A&P                           Patient with syncopal episode.  Happened in a wheelchair.  No injury.  Labs reviewed and overall reassuring but does have likely some dehydration.  Hemoglobin is 8.4.  Little lower than it has been recently.  Not hypotensive however.  Guaiac done and negative.  Although stool was a little dark.  Discussed with patient and son.  With constellation of symptoms appears may be due to dehydration.  Feeling better after some IV fluids.  Does have a hyponatremia with a sodium of 129.  We will need to recheck do not feel we need admission the hospital at this time.  Discharge home with outpatient follow-up. Final Clinical Impression(s) / ED Diagnoses Final diagnoses:  Syncope, unspecified syncope type  Dehydration  Anemia, unspecified type  Hyponatremia    Rx / DC Orders ED Discharge Orders     None        Tate Jerkins,  Ovid Curd, MD 12/28/20 [phone]

## 2021-01-27 ENCOUNTER — Ambulatory Visit: Payer: Medicare Other

## 2021-01-30 ENCOUNTER — Telehealth: Payer: Self-pay

## 2021-01-30 LAB — CUP PACEART REMOTE DEVICE CHECK
Battery Remaining Longevity: 7 mo
Battery Remaining Percentage: 5 %
Battery Voltage: 2.75 V
Brady Statistic AP VP Percent: 1 %
Brady Statistic AP VS Percent: 36 %
Brady Statistic AS VP Percent: 1 %
Brady Statistic AS VS Percent: 63 %
Brady Statistic RA Percent Paced: 33 %
Brady Statistic RV Percent Paced: 1 %
Date Time Interrogation Session: 20221007020013
Implantable Lead Implant Date: 20130214
Implantable Lead Implant Date: 20130214
Implantable Lead Location: 753859
Implantable Lead Location: 753860
Implantable Pulse Generator Implant Date: 20130214
Lead Channel Impedance Value: 390 Ohm
Lead Channel Impedance Value: 390 Ohm
Lead Channel Pacing Threshold Amplitude: 0.625 V
Lead Channel Pacing Threshold Amplitude: 1.25 V
Lead Channel Pacing Threshold Pulse Width: 0.5 ms
Lead Channel Pacing Threshold Pulse Width: 0.5 ms
Lead Channel Sensing Intrinsic Amplitude: 12 mV
Lead Channel Sensing Intrinsic Amplitude: 3.4 mV
Lead Channel Setting Pacing Amplitude: 1.5 V
Lead Channel Setting Pacing Amplitude: 1.625
Lead Channel Setting Pacing Pulse Width: 0.5 ms
Lead Channel Setting Sensing Sensitivity: 2 mV
Pulse Gen Model: 2210
Pulse Gen Serial Number: 7316836

## 2021-01-30 NOTE — Telephone Encounter (Signed)
Called patient to advise increase in monthly battery checks. Patient was [facility], called son Clair Gulling to advise. Also, updated in [employer].

## 2021-02-14 ENCOUNTER — Encounter: Payer: Medicare Other | Admitting: Internal Medicine

## 2021-03-02 ENCOUNTER — Ambulatory Visit (INDEPENDENT_AMBULATORY_CARE_PROVIDER_SITE_OTHER): Payer: Medicare Other

## 2021-03-02 DIAGNOSIS — I480 Paroxysmal atrial fibrillation: Secondary | ICD-10-CM

## 2021-03-03 LAB — CUP PACEART REMOTE DEVICE CHECK
Battery Remaining Longevity: 6 mo
Battery Remaining Percentage: 4 %
Battery Voltage: 2.75 V
Brady Statistic AP VP Percent: 1 %
Brady Statistic AP VS Percent: 34 %
Brady Statistic AS VP Percent: 1 %
Brady Statistic AS VS Percent: 66 %
Brady Statistic RA Percent Paced: 31 %
Brady Statistic RV Percent Paced: 1 %
Date Time Interrogation Session: 20221110100115
Implantable Lead Implant Date: 20130214
Implantable Lead Implant Date: 20130214
Implantable Lead Location: 753859
Implantable Lead Location: 753860
Implantable Pulse Generator Implant Date: 20130214
Lead Channel Impedance Value: 380 Ohm
Lead Channel Impedance Value: 410 Ohm
Lead Channel Pacing Threshold Amplitude: 0.625 V
Lead Channel Pacing Threshold Amplitude: 1.375 V
Lead Channel Pacing Threshold Pulse Width: 0.5 ms
Lead Channel Pacing Threshold Pulse Width: 0.5 ms
Lead Channel Sensing Intrinsic Amplitude: 12 mV
Lead Channel Sensing Intrinsic Amplitude: 2.9 mV
Lead Channel Setting Pacing Amplitude: 1.625
Lead Channel Setting Pacing Amplitude: 1.625
Lead Channel Setting Pacing Pulse Width: 0.5 ms
Lead Channel Setting Sensing Sensitivity: 2 mV
Pulse Gen Model: 2210
Pulse Gen Serial Number: 7316836

## 2021-03-13 NOTE — Progress Notes (Signed)
Remote pacemaker transmission.   

## 2021-03-13 NOTE — Addendum Note (Signed)
Addended by: Cheri Kearns A on: 03/13/2021 08:45 AM   Modules accepted: Level of Service

## 2021-03-27 ENCOUNTER — Encounter (HOSPITAL_COMMUNITY): Payer: Self-pay | Admitting: Oncology

## 2021-04-03 ENCOUNTER — Ambulatory Visit (INDEPENDENT_AMBULATORY_CARE_PROVIDER_SITE_OTHER)

## 2021-04-03 DIAGNOSIS — Z950 Presence of cardiac pacemaker: Secondary | ICD-10-CM

## 2021-04-04 LAB — CUP PACEART REMOTE DEVICE CHECK
Battery Remaining Longevity: 7 mo
Battery Remaining Percentage: 5 %
Battery Voltage: 2.75 V
Brady Statistic AP VP Percent: 1 %
Brady Statistic AP VS Percent: 32 %
Brady Statistic AS VP Percent: 1 %
Brady Statistic AS VS Percent: 68 %
Brady Statistic RA Percent Paced: 29 %
Brady Statistic RV Percent Paced: 1 %
Date Time Interrogation Session: 20221211021211
Implantable Lead Implant Date: 20130214
Implantable Lead Implant Date: 20130214
Implantable Lead Location: 753859
Implantable Lead Location: 753860
Implantable Pulse Generator Implant Date: 20130214
Lead Channel Impedance Value: 410 Ohm
Lead Channel Impedance Value: 440 Ohm
Lead Channel Pacing Threshold Amplitude: 0.625 V
Lead Channel Pacing Threshold Amplitude: 1.25 V
Lead Channel Pacing Threshold Pulse Width: 0.5 ms
Lead Channel Pacing Threshold Pulse Width: 0.5 ms
Lead Channel Sensing Intrinsic Amplitude: 12 mV
Lead Channel Sensing Intrinsic Amplitude: 2.6 mV
Lead Channel Setting Pacing Amplitude: 1.5 V
Lead Channel Setting Pacing Amplitude: 1.625
Lead Channel Setting Pacing Pulse Width: 0.5 ms
Lead Channel Setting Sensing Sensitivity: 2 mV
Pulse Gen Model: 2210
Pulse Gen Serial Number: 7316836

## 2021-04-13 NOTE — Progress Notes (Signed)
Remote pacemaker transmission.   

## 2021-04-13 NOTE — Addendum Note (Signed)
Addended by: Cheri Kearns A on: 04/13/2021 03:00 PM   Modules accepted: Level of Service

## 2021-05-04 ENCOUNTER — Ambulatory Visit (INDEPENDENT_AMBULATORY_CARE_PROVIDER_SITE_OTHER)

## 2021-05-04 DIAGNOSIS — I480 Paroxysmal atrial fibrillation: Secondary | ICD-10-CM

## 2021-05-04 LAB — CUP PACEART REMOTE DEVICE CHECK
Battery Remaining Longevity: 6 mo
Battery Remaining Percentage: 5 %
Battery Voltage: 2.74 V
Brady Statistic AP VP Percent: 1 %
Brady Statistic AP VS Percent: 30 %
Brady Statistic AS VP Percent: 1 %
Brady Statistic AS VS Percent: 70 %
Brady Statistic RA Percent Paced: 27 %
Brady Statistic RV Percent Paced: 1 %
Date Time Interrogation Session: 20230111020008
Implantable Lead Implant Date: 20130214
Implantable Lead Implant Date: 20130214
Implantable Lead Location: 753859
Implantable Lead Location: 753860
Implantable Pulse Generator Implant Date: 20130214
Lead Channel Impedance Value: 380 Ohm
Lead Channel Impedance Value: 430 Ohm
Lead Channel Pacing Threshold Amplitude: 0.625 V
Lead Channel Pacing Threshold Amplitude: 1 V
Lead Channel Pacing Threshold Pulse Width: 0.5 ms
Lead Channel Pacing Threshold Pulse Width: 0.5 ms
Lead Channel Sensing Intrinsic Amplitude: 12 mV
Lead Channel Sensing Intrinsic Amplitude: 2.7 mV
Lead Channel Setting Pacing Amplitude: 1.25 V
Lead Channel Setting Pacing Amplitude: 1.625
Lead Channel Setting Pacing Pulse Width: 0.5 ms
Lead Channel Setting Sensing Sensitivity: 2 mV
Pulse Gen Model: 2210
Pulse Gen Serial Number: 7316836

## 2021-05-16 NOTE — Progress Notes (Signed)
Remote pacemaker transmission.   

## 2021-05-24 DEATH — deceased

## 2021-05-29 ENCOUNTER — Encounter (HOSPITAL_COMMUNITY): Payer: Self-pay | Admitting: Oncology

## 2021-09-28 ENCOUNTER — Encounter (INDEPENDENT_AMBULATORY_CARE_PROVIDER_SITE_OTHER): Payer: Medicare Other | Admitting: Ophthalmology
